# Patient Record
Sex: Female | Born: 1965
Health system: Southern US, Community
[De-identification: ages and names within clinical notes are randomized; demographics above are authoritative.]

## PROBLEM LIST (undated history)

## (undated) DIAGNOSIS — R519 Headache, unspecified: Secondary | ICD-10-CM

## (undated) DIAGNOSIS — Z87442 Personal history of urinary calculi: Secondary | ICD-10-CM

## (undated) DIAGNOSIS — Z9089 Acquired absence of other organs: Secondary | ICD-10-CM

## (undated) DIAGNOSIS — M1990 Unspecified osteoarthritis, unspecified site: Secondary | ICD-10-CM

## (undated) DIAGNOSIS — K5790 Diverticulosis of intestine, part unspecified, without perforation or abscess without bleeding: Secondary | ICD-10-CM

## (undated) DIAGNOSIS — R569 Unspecified convulsions: Secondary | ICD-10-CM

## (undated) DIAGNOSIS — T7840XA Allergy, unspecified, initial encounter: Secondary | ICD-10-CM

## (undated) DIAGNOSIS — T8859XA Other complications of anesthesia, initial encounter: Secondary | ICD-10-CM

## (undated) DIAGNOSIS — Z8719 Personal history of other diseases of the digestive system: Secondary | ICD-10-CM

## (undated) DIAGNOSIS — R112 Nausea with vomiting, unspecified: Secondary | ICD-10-CM

## (undated) DIAGNOSIS — I8390 Asymptomatic varicose veins of unspecified lower extremity: Secondary | ICD-10-CM

## (undated) DIAGNOSIS — D649 Anemia, unspecified: Secondary | ICD-10-CM

## (undated) DIAGNOSIS — K219 Gastro-esophageal reflux disease without esophagitis: Secondary | ICD-10-CM

## (undated) DIAGNOSIS — E041 Nontoxic single thyroid nodule: Secondary | ICD-10-CM

## (undated) DIAGNOSIS — E213 Hyperparathyroidism, unspecified: Secondary | ICD-10-CM

## (undated) DIAGNOSIS — J45909 Unspecified asthma, uncomplicated: Secondary | ICD-10-CM

## (undated) DIAGNOSIS — Z9889 Other specified postprocedural states: Secondary | ICD-10-CM

## (undated) DIAGNOSIS — Z5189 Encounter for other specified aftercare: Secondary | ICD-10-CM

## (undated) DIAGNOSIS — E119 Type 2 diabetes mellitus without complications: Secondary | ICD-10-CM

## (undated) DIAGNOSIS — G4730 Sleep apnea, unspecified: Secondary | ICD-10-CM

## (undated) DIAGNOSIS — I890 Lymphedema, not elsewhere classified: Secondary | ICD-10-CM

## (undated) HISTORY — PX: TONSILLECTOMY: SUR1361

## (undated) HISTORY — PX: FRACTURE SURGERY: SHX138

## (undated) HISTORY — PX: TUBAL LIGATION: SHX77

## (undated) HISTORY — DX: Nontoxic single thyroid nodule: E04.1

## (undated) HISTORY — PX: CHOLECYSTECTOMY: SHX55

## (undated) HISTORY — DX: Type 2 diabetes mellitus without complications: E11.9

## (undated) HISTORY — DX: Sleep apnea, unspecified: G47.30

## (undated) HISTORY — DX: Encounter for other specified aftercare: Z51.89

## (undated) HISTORY — DX: Anemia, unspecified: D64.9

## (undated) HISTORY — DX: Allergy, unspecified, initial encounter: T78.40XA

## (undated) HISTORY — PX: HERNIA REPAIR: SHX51

## (undated) HISTORY — DX: Unspecified asthma, uncomplicated: J45.909

## (undated) HISTORY — DX: Unspecified osteoarthritis, unspecified site: M19.90

## (undated) HISTORY — PX: ACETABULUM FRACTURE SURGERY: SHX541

## (undated) HISTORY — DX: Gastro-esophageal reflux disease without esophagitis: K21.9

---

## 1993-03-28 HISTORY — PX: OTHER SURGICAL HISTORY: SHX169

## 1993-03-28 HISTORY — PX: ELBOW FRACTURE SURGERY: SHX616

## 2002-09-05 ENCOUNTER — Other Ambulatory Visit: Admission: RE | Admit: 2002-09-05 | Discharge: 2002-09-05 | Payer: Self-pay | Admitting: Obstetrics and Gynecology

## 2003-10-28 ENCOUNTER — Other Ambulatory Visit: Admission: RE | Admit: 2003-10-28 | Discharge: 2003-10-28 | Payer: Self-pay | Admitting: Obstetrics and Gynecology

## 2005-01-28 ENCOUNTER — Ambulatory Visit: Payer: Self-pay | Admitting: General Surgery

## 2005-02-04 ENCOUNTER — Other Ambulatory Visit: Payer: Self-pay

## 2005-02-11 ENCOUNTER — Ambulatory Visit: Payer: Self-pay | Admitting: General Surgery

## 2005-04-26 ENCOUNTER — Ambulatory Visit: Payer: Self-pay | Admitting: Unknown Physician Specialty

## 2005-07-05 ENCOUNTER — Emergency Department: Payer: Self-pay | Admitting: Emergency Medicine

## 2005-10-26 ENCOUNTER — Emergency Department: Payer: Self-pay | Admitting: Emergency Medicine

## 2005-10-26 ENCOUNTER — Other Ambulatory Visit: Payer: Self-pay

## 2006-04-07 ENCOUNTER — Ambulatory Visit: Payer: Self-pay

## 2006-07-13 ENCOUNTER — Ambulatory Visit: Payer: Self-pay | Admitting: Internal Medicine

## 2006-07-19 ENCOUNTER — Other Ambulatory Visit: Payer: Self-pay

## 2006-07-19 ENCOUNTER — Emergency Department: Payer: Self-pay | Admitting: Internal Medicine

## 2006-12-13 ENCOUNTER — Ambulatory Visit: Payer: Self-pay | Admitting: Internal Medicine

## 2006-12-15 ENCOUNTER — Inpatient Hospital Stay: Payer: Self-pay | Admitting: Internal Medicine

## 2006-12-28 ENCOUNTER — Ambulatory Visit: Payer: Self-pay | Admitting: Internal Medicine

## 2007-04-05 ENCOUNTER — Other Ambulatory Visit: Payer: Self-pay

## 2007-04-05 ENCOUNTER — Inpatient Hospital Stay: Payer: Self-pay | Admitting: Internal Medicine

## 2007-04-19 ENCOUNTER — Ambulatory Visit: Payer: Self-pay

## 2007-05-06 ENCOUNTER — Ambulatory Visit: Payer: Self-pay | Admitting: Internal Medicine

## 2007-09-28 ENCOUNTER — Ambulatory Visit: Payer: Self-pay | Admitting: Internal Medicine

## 2007-11-27 ENCOUNTER — Emergency Department: Payer: Self-pay | Admitting: Emergency Medicine

## 2008-01-14 ENCOUNTER — Ambulatory Visit: Payer: Self-pay | Admitting: Emergency Medicine

## 2008-03-05 ENCOUNTER — Emergency Department: Payer: Self-pay | Admitting: Emergency Medicine

## 2008-03-23 ENCOUNTER — Emergency Department: Payer: Self-pay | Admitting: Emergency Medicine

## 2008-04-02 ENCOUNTER — Ambulatory Visit: Payer: Self-pay | Admitting: Internal Medicine

## 2008-05-20 ENCOUNTER — Ambulatory Visit: Payer: Self-pay

## 2008-06-05 ENCOUNTER — Emergency Department: Payer: Self-pay | Admitting: Emergency Medicine

## 2008-08-22 ENCOUNTER — Other Ambulatory Visit: Payer: Self-pay | Admitting: Internal Medicine

## 2009-04-08 ENCOUNTER — Other Ambulatory Visit: Payer: Self-pay | Admitting: Internal Medicine

## 2009-06-01 ENCOUNTER — Ambulatory Visit: Payer: Self-pay | Admitting: Gastroenterology

## 2009-06-03 ENCOUNTER — Ambulatory Visit: Payer: Self-pay

## 2009-10-07 ENCOUNTER — Other Ambulatory Visit: Payer: Self-pay | Admitting: Internal Medicine

## 2009-12-04 ENCOUNTER — Emergency Department: Payer: Self-pay | Admitting: Emergency Medicine

## 2010-03-25 ENCOUNTER — Other Ambulatory Visit: Payer: Self-pay | Admitting: Unknown Physician Specialty

## 2010-07-15 ENCOUNTER — Ambulatory Visit: Payer: Self-pay

## 2010-09-27 ENCOUNTER — Other Ambulatory Visit: Payer: Self-pay | Admitting: Unknown Physician Specialty

## 2010-10-11 ENCOUNTER — Other Ambulatory Visit: Payer: Self-pay | Admitting: Internal Medicine

## 2011-01-17 ENCOUNTER — Other Ambulatory Visit: Payer: Self-pay | Admitting: Internal Medicine

## 2011-04-07 ENCOUNTER — Other Ambulatory Visit: Payer: Self-pay | Admitting: Unknown Physician Specialty

## 2011-04-07 LAB — BASIC METABOLIC PANEL
Anion Gap: 8 (ref 7–16)
BUN: 15 mg/dL (ref 7–18)
Calcium, Total: 10.1 mg/dL (ref 8.5–10.1)
Chloride: 103 mmol/L (ref 98–107)
Co2: 29 mmol/L (ref 21–32)
Creatinine: 0.69 mg/dL (ref 0.60–1.30)
Glucose: 97 mg/dL (ref 65–99)
Osmolality: 280 (ref 275–301)
Potassium: 4.1 mmol/L (ref 3.5–5.1)
Sodium: 140 mmol/L (ref 136–145)

## 2011-04-07 LAB — HEMOGLOBIN A1C: Hemoglobin A1C: 6.9 % — ABNORMAL HIGH (ref 4.2–6.3)

## 2011-04-13 ENCOUNTER — Other Ambulatory Visit: Payer: Self-pay | Admitting: Internal Medicine

## 2011-04-13 LAB — CBC WITH DIFFERENTIAL/PLATELET
Basophil %: 3.4 %
Eosinophil #: 0.2 10*3/uL (ref 0.0–0.7)
HGB: 12.1 g/dL (ref 12.0–16.0)
Lymphocyte #: 1.9 10*3/uL (ref 1.0–3.6)
MCH: 26.9 pg (ref 26.0–34.0)
MCHC: 32.8 g/dL (ref 32.0–36.0)
Monocyte #: 0.4 10*3/uL (ref 0.0–0.7)
Neutrophil %: 57.7 %
Platelet: 372 10*3/uL (ref 150–440)

## 2011-04-13 LAB — LIPID PANEL
Cholesterol: 135 mg/dL (ref 0–200)
HDL Cholesterol: 69 mg/dL — ABNORMAL HIGH (ref 40–60)
Triglycerides: 83 mg/dL (ref 0–200)
VLDL Cholesterol, Calc: 17 mg/dL (ref 5–40)

## 2011-04-13 LAB — HEPATIC FUNCTION PANEL A (ARMC)
Albumin: 3.8 g/dL (ref 3.4–5.0)
Alkaline Phosphatase: 55 U/L (ref 50–136)
Bilirubin, Direct: <0.1 mg/dL (ref 0.00–0.20)

## 2011-07-21 ENCOUNTER — Ambulatory Visit: Payer: Self-pay

## 2011-12-30 ENCOUNTER — Inpatient Hospital Stay: Payer: Self-pay | Admitting: Internal Medicine

## 2011-12-30 LAB — URINALYSIS, COMPLETE
Bilirubin,UR: NEGATIVE
Glucose,UR: 50 mg/dL (ref 0–75)
Ketone: NEGATIVE
Nitrite: NEGATIVE
Ph: 5 (ref 4.5–8.0)
RBC,UR: 11 /HPF (ref 0–5)
Squamous Epithelial: 3
WBC UR: 1 /HPF (ref 0–5)

## 2011-12-30 LAB — CBC
MCH: 26.4 pg (ref 26.0–34.0)
MCHC: 32.3 g/dL (ref 32.0–36.0)
MCV: 82 fL (ref 80–100)
Platelet: 361 10*3/uL (ref 150–440)
RBC: 4.26 10*6/uL (ref 3.80–5.20)
RDW: 15.3 % — ABNORMAL HIGH (ref 11.5–14.5)
WBC: 17.5 10*3/uL — ABNORMAL HIGH (ref 3.6–11.0)

## 2011-12-30 LAB — COMPREHENSIVE METABOLIC PANEL
Anion Gap: 11 (ref 7–16)
BUN: 13 mg/dL (ref 7–18)
Bilirubin,Total: 0.6 mg/dL (ref 0.2–1.0)
Calcium, Total: 9.2 mg/dL (ref 8.5–10.1)
Chloride: 104 mmol/L (ref 98–107)
Creatinine: 0.77 mg/dL (ref 0.60–1.30)
EGFR (African American): >60
Potassium: 3.1 mmol/L — ABNORMAL LOW (ref 3.5–5.1)
SGPT (ALT): 29 U/L (ref 12–78)
Total Protein: 7.3 g/dL (ref 6.4–8.2)

## 2011-12-30 LAB — CK TOTAL AND CKMB (NOT AT ARMC): CK-MB: 0.5 ng/mL (ref 0.5–3.6)

## 2011-12-30 LAB — TROPONIN I: Troponin-I: <0.02 ng/mL

## 2011-12-30 LAB — PRO B NATRIURETIC PEPTIDE: B-Type Natriuretic Peptide: <5 pg/mL (ref 0–125)

## 2011-12-31 LAB — BASIC METABOLIC PANEL
Anion Gap: 11 (ref 7–16)
BUN: 8 mg/dL (ref 7–18)
Creatinine: 0.67 mg/dL (ref 0.60–1.30)
EGFR (African American): >60
EGFR (Non-African Amer.): >60
Glucose: 184 mg/dL — ABNORMAL HIGH (ref 65–99)
Sodium: 143 mmol/L (ref 136–145)

## 2011-12-31 LAB — CBC WITH DIFFERENTIAL/PLATELET
Basophil #: 0 10*3/uL (ref 0.0–0.1)
Eosinophil #: 0 10*3/uL (ref 0.0–0.7)
Eosinophil %: 0 %
HCT: 34.5 % — ABNORMAL LOW (ref 35.0–47.0)
Lymphocyte %: 8.7 %
MCH: 26.6 pg (ref 26.0–34.0)
MCHC: 32.8 g/dL (ref 32.0–36.0)
Monocyte %: 2 %
Neutrophil #: 10.8 10*3/uL — ABNORMAL HIGH (ref 1.4–6.5)
Neutrophil %: 89 %
RBC: 4.25 10*6/uL (ref 3.80–5.20)
RDW: 15.7 % — ABNORMAL HIGH (ref 11.5–14.5)
WBC: 12.2 10*3/uL — ABNORMAL HIGH (ref 3.6–11.0)

## 2012-01-23 ENCOUNTER — Other Ambulatory Visit: Payer: Self-pay | Admitting: Internal Medicine

## 2012-01-23 LAB — CBC WITH DIFFERENTIAL/PLATELET
Basophil %: 2 %
Eosinophil #: 0.3 10*3/uL (ref 0.0–0.7)
Eosinophil %: 5.3 %
HCT: 33 % — ABNORMAL LOW (ref 35.0–47.0)
HGB: 11.2 g/dL — ABNORMAL LOW (ref 12.0–16.0)
Lymphocyte #: 2 10*3/uL (ref 1.0–3.6)
MCH: 27.8 pg (ref 26.0–34.0)
MCHC: 33.9 g/dL (ref 32.0–36.0)
MCV: 82 fL (ref 80–100)
Monocyte #: 0.4 x10 3/mm (ref 0.2–0.9)
Monocyte %: 7 %
Neutrophil #: 3.5 10*3/uL (ref 1.4–6.5)
Neutrophil %: 55.2 %
Platelet: 365 10*3/uL (ref 150–440)
RBC: 4.02 10*6/uL (ref 3.80–5.20)
WBC: 6.4 10*3/uL (ref 3.6–11.0)

## 2012-01-23 LAB — LIPID PANEL
Cholesterol: 156 mg/dL (ref 0–200)
HDL Cholesterol: 72 mg/dL — ABNORMAL HIGH (ref 40–60)
Triglycerides: 127 mg/dL (ref 0–200)
VLDL Cholesterol, Calc: 25 mg/dL (ref 5–40)

## 2012-01-23 LAB — URINALYSIS, COMPLETE
Blood: NEGATIVE
Leukocyte Esterase: NEGATIVE
Nitrite: NEGATIVE
Protein: NEGATIVE
Specific Gravity: 1.012 (ref 1.003–1.030)

## 2012-01-23 LAB — COMPREHENSIVE METABOLIC PANEL
Albumin: 3.6 g/dL (ref 3.4–5.0)
Anion Gap: 9 (ref 7–16)
Bilirubin,Total: 0.7 mg/dL (ref 0.2–1.0)
Calcium, Total: 10.1 mg/dL (ref 8.5–10.1)
Creatinine: 0.87 mg/dL (ref 0.60–1.30)
Glucose: 116 mg/dL — ABNORMAL HIGH (ref 65–99)
Potassium: 3.8 mmol/L (ref 3.5–5.1)
SGOT(AST): 27 U/L (ref 15–37)

## 2012-01-23 LAB — BILIRUBIN, DIRECT: Bilirubin, Direct: 0.1 mg/dL (ref 0.00–0.20)

## 2012-01-23 LAB — HEMOGLOBIN A1C: Hemoglobin A1C: 6.9 % — ABNORMAL HIGH (ref 4.2–6.3)

## 2012-03-28 HISTORY — PX: LAPAROSCOPIC GASTRIC SLEEVE RESECTION: SHX5895

## 2012-04-25 ENCOUNTER — Encounter: Payer: Self-pay | Admitting: Unknown Physician Specialty

## 2012-04-28 ENCOUNTER — Encounter: Payer: Self-pay | Admitting: Unknown Physician Specialty

## 2012-05-17 ENCOUNTER — Emergency Department: Payer: Self-pay | Admitting: Emergency Medicine

## 2012-05-17 LAB — CBC
HCT: 36.2 % (ref 35.0–47.0)
MCH: 23.8 pg — ABNORMAL LOW (ref 26.0–34.0)
MCHC: 30.9 g/dL — ABNORMAL LOW (ref 32.0–36.0)
MCV: 77 fL — ABNORMAL LOW (ref 80–100)
Platelet: 343 10*3/uL (ref 150–440)
RBC: 4.71 10*6/uL (ref 3.80–5.20)
RDW: 16 % — ABNORMAL HIGH (ref 11.5–14.5)
WBC: 12 10*3/uL — ABNORMAL HIGH (ref 3.6–11.0)

## 2012-05-17 LAB — URINALYSIS, COMPLETE
Bilirubin,UR: NEGATIVE
Blood: NEGATIVE
Glucose,UR: NEGATIVE mg/dL (ref 0–75)
Ketone: NEGATIVE
Nitrite: NEGATIVE
Protein: NEGATIVE
WBC UR: 1 /HPF (ref 0–5)

## 2012-05-17 LAB — COMPREHENSIVE METABOLIC PANEL
Albumin: 3.4 g/dL (ref 3.4–5.0)
Alkaline Phosphatase: 106 U/L (ref 50–136)
Anion Gap: 10 (ref 7–16)
BUN: 12 mg/dL (ref 7–18)
Calcium, Total: 9.6 mg/dL (ref 8.5–10.1)
Chloride: 105 mmol/L (ref 98–107)
EGFR (African American): >60
EGFR (Non-African Amer.): >60
Osmolality: 278 (ref 275–301)
SGOT(AST): 42 U/L — ABNORMAL HIGH (ref 15–37)

## 2012-05-17 LAB — LIPASE, BLOOD: Lipase: 74 U/L (ref 73–393)

## 2012-05-26 ENCOUNTER — Encounter: Payer: Self-pay | Admitting: Unknown Physician Specialty

## 2012-07-13 ENCOUNTER — Other Ambulatory Visit: Payer: Self-pay | Admitting: Internal Medicine

## 2012-07-13 LAB — CBC WITH DIFFERENTIAL/PLATELET
Basophil #: 0.1 10*3/uL (ref 0.0–0.1)
Basophil %: 1.2 %
Eosinophil #: 0.2 10*3/uL (ref 0.0–0.7)
Eosinophil %: 3.3 %
HCT: 34.7 % — ABNORMAL LOW (ref 35.0–47.0)
Lymphocyte #: 2.2 10*3/uL (ref 1.0–3.6)
Lymphocyte %: 36.1 %
MCH: 24.7 pg — ABNORMAL LOW (ref 26.0–34.0)
MCHC: 32.2 g/dL (ref 32.0–36.0)
MCV: 77 fL — ABNORMAL LOW (ref 80–100)
Monocyte #: 0.4 x10 3/mm (ref 0.2–0.9)
Monocyte %: 5.9 %
Neutrophil #: 3.3 10*3/uL (ref 1.4–6.5)
Neutrophil %: 53.5 %
RDW: 15.7 % — ABNORMAL HIGH (ref 11.5–14.5)
WBC: 6.1 10*3/uL (ref 3.6–11.0)

## 2012-07-13 LAB — URINALYSIS, COMPLETE
Bilirubin,UR: NEGATIVE
Blood: NEGATIVE
Nitrite: NEGATIVE
Squamous Epithelial: 1
WBC UR: 3 /HPF (ref 0–5)

## 2012-07-13 LAB — BASIC METABOLIC PANEL
Anion Gap: 6 — ABNORMAL LOW (ref 7–16)
BUN: 12 mg/dL (ref 7–18)
Calcium, Total: 9.8 mg/dL (ref 8.5–10.1)
Chloride: 107 mmol/L (ref 98–107)
Co2: 27 mmol/L (ref 21–32)
Creatinine: 0.74 mg/dL (ref 0.60–1.30)
EGFR (Non-African Amer.): >60
Glucose: 98 mg/dL (ref 65–99)
Osmolality: 279 (ref 275–301)
Potassium: 3.8 mmol/L (ref 3.5–5.1)
Sodium: 140 mmol/L (ref 136–145)

## 2012-07-13 LAB — HEMOGLOBIN A1C: Hemoglobin A1C: 7.1 % — ABNORMAL HIGH (ref 4.2–6.3)

## 2012-08-08 ENCOUNTER — Ambulatory Visit: Payer: Self-pay | Admitting: Specialist

## 2012-08-09 ENCOUNTER — Ambulatory Visit: Payer: Self-pay | Admitting: Bariatrics

## 2012-08-09 LAB — CBC WITH DIFFERENTIAL/PLATELET
Basophil %: 1.2 %
Eosinophil #: 0 10*3/uL (ref 0.0–0.7)
Eosinophil %: 0 %
MCH: 24.2 pg — ABNORMAL LOW (ref 26.0–34.0)
Monocyte #: 0.5 x10 3/mm (ref 0.2–0.9)
Neutrophil #: 10.6 10*3/uL — ABNORMAL HIGH (ref 1.4–6.5)
RDW: 16.7 % — ABNORMAL HIGH (ref 11.5–14.5)
WBC: 13.4 10*3/uL — ABNORMAL HIGH (ref 3.6–11.0)

## 2012-08-09 LAB — COMPREHENSIVE METABOLIC PANEL
Alkaline Phosphatase: 107 U/L (ref 50–136)
Anion Gap: 6 — ABNORMAL LOW (ref 7–16)
BUN: 11 mg/dL (ref 7–18)
Chloride: 103 mmol/L (ref 98–107)
Co2: 28 mmol/L (ref 21–32)
EGFR (African American): >60
EGFR (Non-African Amer.): >60
Glucose: 114 mg/dL — ABNORMAL HIGH (ref 65–99)
Potassium: 3.8 mmol/L (ref 3.5–5.1)
Sodium: 137 mmol/L (ref 136–145)

## 2012-08-09 LAB — IRON AND TIBC
Iron Bind.Cap.(Total): 414 ug/dL (ref 250–450)
Iron Saturation: 12 %
Iron: 49 ug/dL — ABNORMAL LOW (ref 50–170)

## 2012-08-09 LAB — HEMOGLOBIN A1C: Hemoglobin A1C: 7.4 % — ABNORMAL HIGH (ref 4.2–6.3)

## 2012-08-09 LAB — MAGNESIUM: Magnesium: 1.8 mg/dL

## 2012-08-09 LAB — APTT: Activated PTT: 30.3 secs (ref 23.6–35.9)

## 2012-08-09 LAB — FERRITIN: Ferritin (ARMC): 26 ng/mL (ref 8–388)

## 2012-08-09 LAB — FOLATE: Folic Acid: 18.2 ng/mL (ref 3.1–100.0)

## 2012-08-09 LAB — PHOSPHORUS: Phosphorus: 2.8 mg/dL (ref 2.5–4.9)

## 2012-08-09 LAB — AMYLASE: Amylase: 46 U/L (ref 25–115)

## 2012-08-09 LAB — LIPASE, BLOOD: Lipase: 45 U/L — ABNORMAL LOW (ref 73–393)

## 2012-08-14 ENCOUNTER — Ambulatory Visit: Payer: Self-pay | Admitting: Obstetrics and Gynecology

## 2012-09-05 ENCOUNTER — Ambulatory Visit: Payer: Self-pay | Admitting: Gastroenterology

## 2012-09-06 LAB — PATHOLOGY REPORT

## 2012-09-11 ENCOUNTER — Ambulatory Visit: Payer: Self-pay | Admitting: Specialist

## 2012-09-25 ENCOUNTER — Ambulatory Visit: Payer: Self-pay | Admitting: Specialist

## 2012-10-25 ENCOUNTER — Ambulatory Visit: Payer: Self-pay | Admitting: Specialist

## 2012-10-26 ENCOUNTER — Ambulatory Visit: Payer: Self-pay | Admitting: Specialist

## 2012-11-26 ENCOUNTER — Ambulatory Visit: Payer: Self-pay | Admitting: Specialist

## 2013-01-29 ENCOUNTER — Ambulatory Visit: Payer: Self-pay | Admitting: Specialist

## 2013-01-29 LAB — HEMOGLOBIN: HGB: 12.4 g/dL (ref 12.0–16.0)

## 2013-02-05 ENCOUNTER — Inpatient Hospital Stay: Payer: Self-pay | Admitting: Specialist

## 2013-02-06 LAB — BASIC METABOLIC PANEL
Anion Gap: 10 (ref 7–16)
Calcium, Total: 9.1 mg/dL (ref 8.5–10.1)
Co2: 24 mmol/L (ref 21–32)
Creatinine: 0.82 mg/dL (ref 0.60–1.30)
EGFR (African American): >60
EGFR (Non-African Amer.): >60
Glucose: 119 mg/dL — ABNORMAL HIGH (ref 65–99)
Potassium: 3.4 mmol/L — ABNORMAL LOW (ref 3.5–5.1)
Sodium: 138 mmol/L (ref 136–145)

## 2013-02-06 LAB — CBC WITH DIFFERENTIAL/PLATELET
Basophil #: 0.1 10*3/uL (ref 0.0–0.1)
Eosinophil %: 0.3 %
HGB: 11.5 g/dL — ABNORMAL LOW (ref 12.0–16.0)
MCH: 27.1 pg (ref 26.0–34.0)
MCV: 80 fL (ref 80–100)
Monocyte #: 0.6 x10 3/mm (ref 0.2–0.9)
Monocyte %: 6.1 %
Neutrophil #: 7 10*3/uL — ABNORMAL HIGH (ref 1.4–6.5)
Platelet: 309 10*3/uL (ref 150–440)
RBC: 4.23 10*6/uL (ref 3.80–5.20)
WBC: 9.5 10*3/uL (ref 3.6–11.0)

## 2013-02-06 LAB — PHOSPHORUS: Phosphorus: 2.4 mg/dL — ABNORMAL LOW (ref 2.5–4.9)

## 2013-02-06 LAB — ALBUMIN: Albumin: 3.3 g/dL — ABNORMAL LOW (ref 3.4–5.0)

## 2013-02-06 LAB — PATHOLOGY REPORT

## 2013-02-06 LAB — MAGNESIUM: Magnesium: 1.3 mg/dL — ABNORMAL LOW

## 2013-02-25 ENCOUNTER — Ambulatory Visit: Payer: Self-pay | Admitting: Specialist

## 2013-03-28 ENCOUNTER — Ambulatory Visit: Payer: Self-pay | Admitting: Specialist

## 2013-05-07 ENCOUNTER — Other Ambulatory Visit: Payer: Self-pay | Admitting: Internal Medicine

## 2013-05-07 LAB — CBC WITH DIFFERENTIAL/PLATELET
Basophil #: 0.1 10*3/uL (ref 0.0–0.1)
Basophil %: 1.4 %
Eosinophil #: 0.3 10*3/uL (ref 0.0–0.7)
Eosinophil %: 4.9 %
HCT: 37.4 % (ref 35.0–47.0)
HGB: 12.6 g/dL (ref 12.0–16.0)
LYMPHS ABS: 2.5 10*3/uL (ref 1.0–3.6)
LYMPHS PCT: 37.6 %
MCH: 28.2 pg (ref 26.0–34.0)
MCHC: 33.6 g/dL (ref 32.0–36.0)
MCV: 84 fL (ref 80–100)
Monocyte #: 0.5 x10 3/mm (ref 0.2–0.9)
Monocyte %: 7.7 %
Neutrophil #: 3.2 10*3/uL (ref 1.4–6.5)
Neutrophil %: 48.4 %
Platelet: 278 10*3/uL (ref 150–440)
RBC: 4.47 10*6/uL (ref 3.80–5.20)
RDW: 16.1 % — ABNORMAL HIGH (ref 11.5–14.5)
WBC: 6.6 10*3/uL (ref 3.6–11.0)

## 2013-05-07 LAB — URINALYSIS, COMPLETE
BILIRUBIN, UR: NEGATIVE
Blood: NEGATIVE
Glucose,UR: NEGATIVE mg/dL (ref 0–75)
Nitrite: NEGATIVE
PH: 5 (ref 4.5–8.0)
Specific Gravity: 1.029 (ref 1.003–1.030)
Squamous Epithelial: 7

## 2013-05-07 LAB — COMPREHENSIVE METABOLIC PANEL
ALBUMIN: 3.5 g/dL (ref 3.4–5.0)
Alkaline Phosphatase: 90 U/L
Anion Gap: 3 — ABNORMAL LOW (ref 7–16)
BUN: 10 mg/dL (ref 7–18)
Bilirubin,Total: 1 mg/dL (ref 0.2–1.0)
CO2: 30 mmol/L (ref 21–32)
Calcium, Total: 10.3 mg/dL — ABNORMAL HIGH (ref 8.5–10.1)
Chloride: 105 mmol/L (ref 98–107)
Creatinine: 0.81 mg/dL (ref 0.60–1.30)
EGFR (African American): >60
EGFR (Non-African Amer.): >60
Glucose: 99 mg/dL (ref 65–99)
OSMOLALITY: 275 (ref 275–301)
Potassium: 3.4 mmol/L — ABNORMAL LOW (ref 3.5–5.1)
SGOT(AST): 32 U/L (ref 15–37)
SGPT (ALT): 44 U/L (ref 12–78)
SODIUM: 138 mmol/L (ref 136–145)
TOTAL PROTEIN: 7.8 g/dL (ref 6.4–8.2)

## 2013-05-07 LAB — LIPID PANEL
Cholesterol: 130 mg/dL (ref 0–200)
HDL Cholesterol: 66 mg/dL — ABNORMAL HIGH (ref 40–60)
Ldl Cholesterol, Calc: 46 mg/dL (ref 0–100)
TRIGLYCERIDES: 92 mg/dL (ref 0–200)
VLDL Cholesterol, Calc: 18 mg/dL (ref 5–40)

## 2013-09-02 ENCOUNTER — Ambulatory Visit: Payer: Self-pay | Admitting: Obstetrics and Gynecology

## 2013-10-22 ENCOUNTER — Other Ambulatory Visit: Payer: Self-pay | Admitting: Specialist

## 2013-10-22 LAB — CBC WITH DIFFERENTIAL/PLATELET
BASOS PCT: 1.2 %
Basophil #: 0.1 10*3/uL (ref 0.0–0.1)
Eosinophil #: 0.3 10*3/uL (ref 0.0–0.7)
Eosinophil %: 4 %
HCT: 35.8 % (ref 35.0–47.0)
HGB: 12 g/dL (ref 12.0–16.0)
LYMPHS ABS: 2.2 10*3/uL (ref 1.0–3.6)
Lymphocyte %: 34.2 %
MCH: 29 pg (ref 26.0–34.0)
MCHC: 33.6 g/dL (ref 32.0–36.0)
MCV: 87 fL (ref 80–100)
MONOS PCT: 5.6 %
Monocyte #: 0.4 x10 3/mm (ref 0.2–0.9)
Neutrophil #: 3.6 10*3/uL (ref 1.4–6.5)
Neutrophil %: 55 %
PLATELETS: 332 10*3/uL (ref 150–440)
RBC: 4.14 10*6/uL (ref 3.80–5.20)
RDW: 14.2 % (ref 11.5–14.5)
WBC: 6.5 10*3/uL (ref 3.6–11.0)

## 2013-10-22 LAB — COMPREHENSIVE METABOLIC PANEL
ANION GAP: 4 — AB (ref 7–16)
Albumin: 3.8 g/dL (ref 3.4–5.0)
Alkaline Phosphatase: 101 U/L
BUN: 10 mg/dL (ref 7–18)
Bilirubin,Total: 0.8 mg/dL (ref 0.2–1.0)
CALCIUM: 10.3 mg/dL — AB (ref 8.5–10.1)
Chloride: 103 mmol/L (ref 98–107)
Co2: 30 mmol/L (ref 21–32)
Creatinine: 0.78 mg/dL (ref 0.60–1.30)
EGFR (African American): >60
EGFR (Non-African Amer.): >60
Glucose: 87 mg/dL (ref 65–99)
Osmolality: 272 (ref 275–301)
Potassium: 3.9 mmol/L (ref 3.5–5.1)
SGOT(AST): 35 U/L (ref 15–37)
SGPT (ALT): 38 U/L
Sodium: 137 mmol/L (ref 136–145)
Total Protein: 8.4 g/dL — ABNORMAL HIGH (ref 6.4–8.2)

## 2013-10-22 LAB — IRON AND TIBC
IRON BIND. CAP.(TOTAL): 312 ug/dL (ref 250–450)
Iron Saturation: 13 %
Iron: 40 ug/dL — ABNORMAL LOW (ref 50–170)
UNBOUND IRON-BIND. CAP.: 272 ug/dL

## 2013-10-22 LAB — AMYLASE: AMYLASE: 73 U/L (ref 25–115)

## 2013-10-22 LAB — FERRITIN: Ferritin (ARMC): 137 ng/mL (ref 8–388)

## 2013-10-22 LAB — FOLATE: Folic Acid: 16.9 ng/mL (ref 3.1–100.0)

## 2013-10-22 LAB — MAGNESIUM: Magnesium: 2.1 mg/dL

## 2013-10-22 LAB — PHOSPHORUS: Phosphorus: 3 mg/dL (ref 2.5–4.9)

## 2014-07-15 NOTE — Consult Note (Signed)
PATIENT NAME:  Ashley [name], Ashley [name] MR#:  [id number] DATE OF BIRTH:  [birth year]  DATE OF CONSULTATION:  12/30/2011  REFERRING PHYSICIAN:   Glennie IsleSheryl Gottlieb, MD  CONSULTING PHYSICIAN:  Zackery BarefootJ. 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 She noticed some initial shortness of breath and rapid heart rate as well as cough which progressed very quickly where she became short of breath and tachypneic. I was called emergently by the Emergency Room and Dr. Mindi JunkerGottlieb for suspicion of progressive angioedema presumptively secondary to the flu shot. When I arrived in the Emergency Room, the patient was coughing and obviously very anxious, but I detected no stridor. After I examined her and my examination revealed primarily bronchospasm, worse on the right than on the left, I advised the patient, who was still coherent at that point, that I would electively intubate her if she became exhausted and could not sustain her tachypnea; and she admitted to be coming more fatigued after about 5 to 10 minutes, at which time we elected to intubate her.   ALLERGIES: Penicillin and Dilaudid.   PAST MEDICAL HISTORY:  1. Diabetes.  2. Iron deficiency anemia.  3. Childhood migraines.  4. Obstructive sleep apnea.  5. Normocalcemic hyperparathyroidism.  6. Barrett's esophagus.   PHYSICAL EXAMINATION:  GENERAL: A very anxious female with tachypnea and cough that is very frequent in spite of her tachypnea. No stridor.   LUNGS: There is expiratory wheeze bilaterally, worse on the right than on the left.   HEENT: Oral cavity/oropharynx: Mild floor of mouth edema.   PROCEDURE: Flexible fiber-optic laryngoscopy reveals normal nasopharynx. The endolarynx  is significant for interarytenoid pachyderma with erythema. No significant supraglottic edema. No epiglottic inflammation. No purulence. Vocal cords abduct and adduct fully. No subglottic lesions.   IMPRESSION: Bronchospastic reaction ostensibly to the flu shot as noted above. She became acutely short of breath and required intubation because of respiratory fatigue.    PLAN:  1. Dr. Cherlynn KaiserSainani has been kind enough to admit the patient, and she will receive IV Solu-Medrol as well as p.r.n. Xopenex.  2. She will receive Pulmonary consultation by Dr. Ned ClinesHerbon Fleming.  3. If the patient is able to be weaned and extubated tomorrow, I will plan on being present for extubation to make sure she is without any upper airway compromise.   ____________________________ J. Gertie BaronMadison Agueda Houpt, MD jmc:cbb [name]: 12/30/2011 18:10:33 ET T: 12/30/2011 18:38:13 ET JOB#: [id number]  cc: Zackery BarefootJ. Madison Jamaria Amborn, MD, <Dictator> Glorious PeachSonya Thompson -  ENT Wendee CoppJMADISON Dorcus Riga MD ELECTRONICALLY SIGNED 01/26/2012 10:39

## 2014-07-15 NOTE — H&P (Signed)
PATIENT NAME:  Ashley [name], Ashley [name] MR#:  [id number] DATE OF BIRTH:  [birth year]  DATE OF ADMISSION:  12/30/2011  PRIMARY CARE PHYSICIAN: Daniel Nones, Ashley [name]   CHIEF COMPLAINT: Shortness of breath.   HISTORY OF PRESENT ILLNESS: This is a 49 year old female who actually came to work, she works here in the Emergency Room, and was at work and went to get a flu shot earlier this morning. Shortly after she got the flu shot, she started to become a bit short of breath and tachypnea. She was complaining of some pain on the site where she got the flu shot, but her respiratory symptoms progressively started to get worse. The patient started to have a cough and was having significant difficulty breathing. It was suspected that she was having an allergic reaction to the flu shot, although she has had flu shots every year, but never had a reaction like this. As per the husband and other family, she has had no other environmental exposures and no herbal supplements, no other new medications. She was seen by Dr. Gertie Baron from ENT and underwent a laryngoscopy which showed no evidence of upper airway erythema or vocal cord edema. Her bronchospasm continued to get worse and she was having a difficult time breathing. Therefore she was urgently intubated in the Emergency Room. Hospitalist services were contacted for further treatment and evaluation. Prior to today, the patient had no systemic complaints of shortness of breath, chest pain, nausea, vomiting, abdominal pain, fevers, chills, cough, or any other associated symptoms.   REVIEW OF SYSTEMS: Currently unobtainable given the patient's mental status. She is intubated and sedated.   PAST MEDICAL HISTORY:  1. Diabetes.  2. Iron deficiency anemia. 3. History of childhood migraines.  4. History of obstructive sleep apnea. 5. Hyperparathyroidism with normal calcium.  6. History of Barrett's esophagus.   ALLERGIES: Penicillin and Dilaudid. Penicillin causes  anaphylaxis.   SOCIAL HISTORY: No smoking. No alcohol abuse. No illicit drug abuse. Lives at home with her husband.   FAMILY HISTORY: Significant for diabetes on both the mother and father's side of the family. Her mother is also on dialysis.   CURRENT MEDICATIONS: The patient is currently taking just multivitamins, but no medications, even for her diabetes, presently.   ADMISSION PHYSICAL EXAMINATION:  VITAL SIGNS: Temperature is afebrile, pulse 104, respirations 18, blood pressure 128/66, and  saturation 100% on 50% FiO2 on a ventilator.   GENERAL: A female lying in bed, intubated and sedated, in mild distress.   HEENT: Atraumatic, normocephalic. Her pupils are equal and reactive to light. Sclerae anicteric. No conjunctival injection. No pharyngeal erythema.   NECK: Supple. No jugular venous distention, bruits, lymphadenopathy, or thyromegaly.   HEART: Regular rate and rhythm, tachycardic. No murmurs, rubs, or clicks. Anteriorly has coarse rhonchi and wheezing diffusely, although negative use of accessory muscles. No dullness to percussion.   ABDOMEN: Soft, flat, nontender, and nondistended. Has good bowel sounds. No hepatosplenomegaly appreciated.   EXTREMITIES: No evidence of any cyanosis, clubbing, or peripheral edema. Has +2 pedal and radial pulses bilaterally.   NEUROLOGIC: Difficult to do a full neurological examination as she is currently sedated and intubated.   SKIN: Moist and warm with no rashes.   LYMPHATIC: No cervical or axillary lymphadenopathy.  LABS: Serum glucose 203, BUN 13, creatinine 0.7, sodium 137, potassium 3.1, chloride 104, and bicarbonate 22. LFTs are within normal limits. Troponin less than 0.02. White cell count 17.5, hemoglobin 11.2, hematocrit 34.8, and platelet count 361.  ABG  showed pH 7.36, pCO2 40, pO2 58, and saturation 93% on 30% FiO2.   EKG showed normal sinus rhythm and sinus tachycardia, but no evidence of acute ST or T wave changes.    ASSESSMENT AND PLAN: This is a 49 year old female with a history of diabetes, history of childhood migraines, obstructive sleep apnea, and hyperparathyroidism who presents to the hospital with shortness of breath and noted to have an allergic reaction to a flu shot going into respiratory failure and now requiring intubation.  1. Acute respiratory failure. This is likely suspected to be secondary to an allergic reaction to the flu shot as the patient went into severe bronchospasm and shortness of breath shortly after it. She has no previous history of chronic obstructive pulmonary disease or asthma. There are no other environmental agents or exposures noted. For now we will continue full vent support and wean her as tolerated. I will get a pulmonary consult to help with the vent management. We will put her on IV Solu-Medrol every six hours, also continue IV Zantac and p.r.n. Benadryl for now, and follow her clinically.  2. Allergic reaction/anaphylaxis. She does have a history of allergy to penicillin an Dilaudid, but she did not get any of these medications. The only thing she got this morning was a flu shot which is likely suspected to be the culprit for anaphylaxis or allergic reaction. She is currently intubated. I will continue IV Solu-Medrol, Zantac, and p.r.n. Benadryl. She has already had a laryngoscopy done by Dr. Gertie BaronMadison Clark, which did not show any upper airway erythema or vocal cord edema. She likely will benefit from outpatient allergy testing upon discharge.  3. Leukocytosis. I think this is likely stress/steroid mediated. No evidence of acute infection noted. I will go ahead and follow her white cell count. She is afebrile and hemodynamically stable.  4. Diabetes. The patient currently is not taking any medications, but she will be getting IV steroids so her sugars will run high. I will go ahead and start her on sliding scale insulin for now.  5. Hypokalemia. I will go ahead and supplement  her potassium accordingly. We will check a magnesium level.     CODE STATUS: FULL CODE.  The patient will be transferred over to Dr. Deland PrettyBert Klein's service.   CRITICAL CARE TIME SPENT: 50 minutes.  ____________________________ Rolly PancakeVivek J. Cherlynn KaiserSainani, Ashley [name] vjs:slb D: 12/30/2011 15:58:56 ET T: 12/30/2011 16:18:06 ET JOB#: [id number]  cc: Rolly PancakeVivek J. Cherlynn KaiserSainani, Ashley [name], <Dictator> Ashley FerrierBert J. Klein III, Ashley [name] Houston SirenVIVEK J Deiona Hooper Ashley [name] ELECTRONICALLY SIGNED 01/07/2012 14:57

## 2014-07-15 NOTE — Discharge Summary (Signed)
PATIENT NAME:  Ashley [name], Ashley [name], Ashley [name]  DATE OF ADMISSION:  12/30/2011 DATE OF DISCHARGE:  01/02/2012  FINAL DIAGNOSES:  1. Acute respiratory failure due to allergic reaction to flu shot.  2. Gastroesophageal reflux disease.  3. Adult onset diabetes mellitus.  4. Anemia.  5. Obstructive sleep apnea.  6. History of hyperparathyroidism.   HISTORY AND PHYSICAL: Please see dictated admission history and physical.   SUMMARY OF HOSPITAL COURSE: The patient was admitted with progressive dyspnea and bronchospasm after getting a shot that morning. Her condition deteriorated to the point that she was unable to maintain her oxygenation and she was intubated. Fortunately she was able to be weaned and extubated fairly rapidly, although she continued to have significant evidence of bronchospasm. She was followed by both pulmonology and ENT. She was placed on increasing doses of medications versus reflux and continued on steroid treatment. Antihistamines were added in addition. She was weaned to room air and was able to begin ambulating on her own, although she still felt weak and had some minimal wheezing. Her oxygen saturations were excellent and she was walking well without issue. At that point, it was felt she was stable to be discharged to home and so was sent home with activity to be up as tolerated with anticipation to return to work initially within one week, but really after we had a chance to reevaluate her. She is advised to check her sugars and record these daily and to present back to the emergency room immediately should she develop any increasing shortness of breath. She is comfortable with this plan.   DISCHARGE MEDICATIONS:  1. Ferrous sulfate 325 mg p.o. daily. 2. Victoza 1.2 mcg injected daily.  3. Prednisone taper as directed.  4. Zantac 150 mg p.o. twice a day x12 days.  5. Omeprazole 20 mg p.o. twice a day. 6. Claritin 10 mg p.o. daily x12  days.  7. Albuterol metered-dose inhaler 2 puffs every 4 hours as needed for wheezing.   DIET: She is advised to follow an 1800 calorie ADA diet, in addition.  ____________________________ Curtis SitesBert J. Rosealyn Little III, MD bjk:slb D: 01/10/2012 17:08:44 ET T: 01/11/2012 10:58:00 ET JOB#: [id number]  cc: Curtis SitesBert J. Aras Albarran III, MD, <Dictator> Daniel NonesBERT Kataleya Zaugg MD ELECTRONICALLY SIGNED 01/11/2012 12:40

## 2014-07-18 NOTE — H&P (Signed)
   Subjective/Chief Complaint morbid obesity   History of Present Illness here for sleeve gastrectomy   Past Med/Surgical Hx:  vitamin d & E deficiency:   anemia:   diverticulitis:   Diabetes Mellitus, Type II (NIDD):   Cholecystectomy:   Tubal Ligation:   left pelvic cap due to fx:   elbow surgery - left:   ankle surgery - left:   C-Section:   Hernia Repair:   ALLERGIES:  PCN: Rash  Other -Explain in Comment: Rash  flu vaccines: Anaphylaxis  Dilaudid: Itching  Review of Systems:  Fever/Chills No   Cough No   Sputum No   Abdominal Pain No   Diarrhea No   Constipation No   Nausea/Vomiting No   SOB/DOE No   Chest Pain No   Telemetry Reviewed Afib   Dysuria No   Physical Exam:  GEN well developed, well nourished, no acute distress   HEENT PERRL   NECK No masses  trachea midline   RESP normal resp effort  clear BS   CARD regular rate  no murmur   ABD denies tenderness  soft  no Adominal Mass    Assessment/Admission Diagnosis MO   Plan sleeve gastrectomy   Electronic Signatures: Geoffry ParadiseBruce, Melitta Tigue M (MD)  (Signed 11-Nov-14 07:35)  Authored: CHIEF COMPLAINT and HISTORY, PAST MEDICAL/SURGIAL HISTORY, ALLERGIES, REVIEW OF SYSTEMS, PHYSICAL EXAM, ASSESSMENT AND PLAN   Last Updated: 11-Nov-14 07:35 by Geoffry ParadiseBruce, Brylan Seubert M (MD)

## 2014-07-18 NOTE — Op Note (Signed)
PATIENT NAME:  Ashley [name], Ashley [name] MR#:  [id number] DATE OF BIRTH:  [birth year]  DATE OF PROCEDURE:  02/05/2013  PREOPERATIVE DIAGNOSIS: Morbid obesity.  POSTOPERATIVE DIAGNOSIS: Morbid obesity and hiatal hernia.   PROCEDURE: Laparoscopic sleeve gastrectomy with hiatal hernia repair and lysis of adhesions.   SURGEON: Primus Bravo, M.D.  ASSISTANT: Mariella Saa, PA-C  COMPLICATIONS: None.   ESTIMATED BLOOD LOSS: Negligible.   ANESTHESIA: General endotracheal.   SPECIMENS: Remnant portion of stomach to pathology.   CLINICAL HISTORY: See H and P.   SURGEON: Primus Bravo, MD  ASSISTANT: Ashley Sos, PA-C  CLINICAL HISTORY: See History and Physical.  DETAILS OF PROCEDURE:  The patient was taken to the operating room and placed on the operating room table in the supine, monitors and supplemental oxygen being delivered. Broad spectrum IV antibiotics were administered. The patient was placed under general anesthesia without incident. The abdomen was prepped and draped in the usual sterile fashion. Access was obtained using 5 mm Optical trocar in the left upper outer quadrant. Pneumoperitoneum was established. Multiple other ports were placed in preparation for sleeve gastrectomy and liver retractor placed. At that point, a moderate hiatal hernia was identified. The Harmonic scalpel was utilized to take down the pars flaccida of the right crura and the right crura was fully mobilized. The esophagus was retracted anteriorly showing the crural junction and the moderate hiatal hernia. The posterior vagus nerve was preserved throughout the entire dissection.  The posterior crural repair was performed using interrupted 0 Surgidac suture. At that point, attention was then placed to the stomach. The greater curvature vascular was mobilized from 5 to 7 cm from the pylorus all the way up to the left crura. The posterior attachments were taken down as well completely mobilizing the fundus and the greater  curvature of the stomach so the crow's feet could be fully visualized all the way up. A 34 [place] Bougie was then inserted transorally down into the distal antrum and several sequential firings starting with an echelon green load used to bisect the antrum between the lesser curvature and the greater curvature and then sequential firings of the blue load through the opposite trocar. Using the technique of Hargroder all the way up to just lateral to the GE junction. Staples were all formed very nicely. No evidence of leak was present. No evidence of bleeding was present. The specimen was retrieved and sent for pathologic evaluation. The entire operation blood loss was less than 250 mL. No complications occurred during the entire procedure. I performed upper endoscopy and a nice curvilinear tube was present without any impingement upon the linea angularis. I was quite pleased with the outcome. I used the suction and sucked out the extra gas. I then regowned and regloved and pulled out the delivery retractor as the specimen had already been retrieved. The wounds were closed with 4-0 Vicryl and Dermabond. The patient tolerated the procedure well, was taken to the recovery room in stable condition, and will be admitted for approximately two days for care.    ADDENDUMS:  1.  The hiatal hernia was closed in my usual fashion by coapting the posterior crura after careful dissection preserving the esophagus and the posterior vagus nerve with interrupted 0 Surgidac sutures.  2.  The 36-French ViSiGi was used in lieu of the 34-French bougie.  3.  Seam Guard was used on all fires. 4.  No provocative leak test was performed and the sleeve started at 5 cm from the pylorus.  ____________________________ Primus BravoJon Braeton Wolgamott, MD jb:sb D: 02/05/2013 09:14:26 ET T: 02/05/2013 09:22:38 ET JOB#: [id number]  cc: Primus BravoJon Jasiya Markie, MD, <Dictator>  Geoffry ParadiseJON M Meili Kleckley MD ELECTRONICALLY SIGNED 02/12/2013 12:55

## 2014-11-21 ENCOUNTER — Other Ambulatory Visit: Payer: Self-pay | Admitting: Obstetrics and Gynecology

## 2014-11-24 ENCOUNTER — Telehealth: Payer: Self-pay

## 2014-12-08 ENCOUNTER — Encounter: Payer: Self-pay | Admitting: Pharmacist

## 2014-12-08 ENCOUNTER — Other Ambulatory Visit: Payer: Self-pay | Admitting: Pharmacist

## 2014-12-08 NOTE — Patient Outreach (Signed)
[facility]) Care Management  [facility]   12/08/2014  KABAO LEITE Nibblett 1965-04-03 [id number]  Subjective: Ashley [name] is a 49 year old female who is participating in the Employee-sponsored Link to Wellness Diabetes program.  She presents for her 6 month follow up visit.  She has no complaints today.  Patient reports her most recent A1c was 5%.  She is currently monitoring her glucose PRN as directed by her primary care provider.  She denies any hypoglycemic episodes.  Patient reports she is not currently on pharmacotherapy for diabetes, and is managing her diabetes with diet and exercise.  Patient had gastric bypass surgery and follows a strict diet.  She eats approximately 6 small meals daily and reports following a low carbohydrate diet (no bread, pasta, potatoes).  She reports going to the gym twice weekly for at least one hour and alternates walking and using other machines.  She also walks with a friend once weekly at the park for 1-1.5 miles.    Objective:  Blood pressure: 108/70  Current Medications: Current Outpatient Prescriptions  Medication Sig Dispense Refill  . albuterol (PROVENTIL HFA;VENTOLIN HFA) 108 (90 BASE) MCG/ACT inhaler Inhale 2 puffs into the lungs every 6 (six) hours as needed for wheezing or shortness of breath.    . Biotin 2500 MCG CAPS Take 5,000 mcg by mouth daily.    . budesonide-formoterol (SYMBICORT) 80-4.5 MCG/ACT inhaler Inhale 2 puffs into the lungs daily as needed.    . busPIRone (BUSPAR) 15 MG tablet Take 15 mg by mouth 2 (two) times daily as needed.    . Cholecalciferol 1000 UNITS tablet Take 5,000 Units by mouth daily.    Marland Kitchen EPINEPHrine (EPIPEN 2-PAK) 0.3 mg/0.3 mL IJ SOAJ injection Inject 0.3 mg into the muscle as needed (for anaphylaxis).    Marland Kitchen escitalopram (LEXAPRO) 10 MG tablet Take 10 mg by mouth daily.    . ferrous sulfate 325 (65 FE) MG tablet Take 650 mg by mouth 2 (two) times daily with a meal.    .  fluticasone (FLONASE) 50 MCG/ACT nasal spray Place 1 spray into both nostrils daily.    . Multiple Vitamins-Minerals (MULTIVITAMIN WITH MINERALS) tablet Take 1 tablet by mouth daily.    Marland Kitchen omeprazole (PRILOSEC) 40 MG capsule Take 40 mg by mouth 2 (two) times daily.    . polycarbophil (FIBER LAXATIVE) 625 MG tablet Take 625 mg by mouth daily.     No current facility-administered medications for this visit.   Functional Status: In your present state of health, do you have any difficulty performing the following activities: 12/08/2014  Hearing? Y  Vision? Y  Difficulty concentrating or making decisions? N  Walking or climbing stairs? N  Dressing or bathing? N  Doing errands, shopping? N   Fall/Depression Screening: PHQ 2/9 Scores 12/08/2014  PHQ - 2 Score 0   Assessment: 1. Diabetes: Patient's A1c is at goal of less than 7%.  Controlled by diet and exercise.  Patient is not currently on pharmacotherapy for diabetes.   2.  Hypertension:  Patients BP is at goal of less than 140/90.  Patient is not currently on any antihypertensive medications.   Plan: 1.  Mrs. [name] will continue to exercise at least 150 minutes per week and eat a low carbohydrate diet.   2.  Patient will schedule an annual eye exam at the beginning of 2017.   3.  She will follow up with Pharmacy in 6 months on 06/08/15.  [facility] CM Care Plan Problem One        Most Recent Value   Care Plan Problem One  Potential for elevated blood glucose   Role Documenting the Problem One  Clinical Pharmacist   Care Plan for Problem One  Active   [facility] Long Term Goal (31-90 days)  Will maintain A1c less than 7% in the next 90 days per patient report.     [facility] Long Term Goal Start Date  12/08/14   Interventions for Problem One Long Term Goal  Discussed importance of diet and exercise for blood glucose control.  Patient had gastric surgery and follows very strict diet.  Patient will work toward increasing activity level and will  aim to lose 10 lbs in the next 6 months.       Lilla Shook, Pharm.D. Pharmacy Resident [facility]

## 2014-12-09 ENCOUNTER — Encounter: Payer: Self-pay | Admitting: Pharmacist

## 2014-12-19 ENCOUNTER — Ambulatory Visit: Payer: Self-pay | Admitting: Dietician

## 2015-01-01 ENCOUNTER — Encounter: Payer: Self-pay | Admitting: Dietician

## 2015-01-01 ENCOUNTER — Encounter: Payer: 59 | Attending: Specialist | Admitting: Dietician

## 2015-01-01 VITALS — Ht 66.0 in | Wt 229.9 lb

## 2015-01-01 DIAGNOSIS — E669 Obesity, unspecified: Secondary | ICD-10-CM

## 2015-01-01 DIAGNOSIS — Z9884 Bariatric surgery status: Secondary | ICD-10-CM | POA: Diagnosis not present

## 2015-01-01 NOTE — Patient Instructions (Signed)
   Track protein intake throughout the day, add grams of protein from each food or protein drink. Aim for 60-80g protein daily.   Count fluid from your protein drinks towards your total fluid intake for the day.   Gradually increase physical activity as you are able.

## 2015-01-01 NOTE — Progress Notes (Signed)
Medical Nutrition Therapy: Visit start time: 1330  end time: 1430  Assessment:  Diagnosis: post-bariatric surgery Past medical history: Diabetes (?remission), diverticulosis, GERD, sleep apnea Psychosocial issues/ stress concerns: patient reports moderate stress level Preferred learning method:  . Hands-on  Current weight: 229.9lbs  Height: 5'6" Medications, supplements: reviewed list in chart with patient  Progress and evaluation: Patient reports she still has very limited capacity in her stomach.         She states she is drinking 2-3 premier protein shakes per week.          She eats 3 fruits and 2 veggies daily          Feels she might be overeating protein, sometimes over 100g daily when tracking intake.         She states she has not continuously followed her bariatric diet over the past year, as both parents passed away within a few months last year.   Physical activity: walking 1 hour, 2 times per week; less in past month due to husband having a heart attack.  Dietary Intake:  Usual eating pattern includes 3 meals and 3 snacks per day. Dining out frequency: unknown meals per week.  Breakfast: protein shake 30-35g total sometimes with extra protein or a banana; takes several hours to finish. Banana or [place] stick (louis rich) Snack: boiled egg, maybe yogurt (1/2 of a cup, finishes 2 hours later) Lunch: [place] stick, cheese, 1/2 small bowl of salad with romaine.  Snack: beef jerky or atkins or weight watchers bar or yogurt, fruit with cheese Supper: vegetable sweet potato, carrots, chicken often, pork chop, steak. Avoids processed meats, pasta, white potatoes.  Snack: cracker, craving salty food at night Beverages: drinks 32-64oz water, coffee daily. Some unsweetened tea.   Nutrition Care Education: Topics covered: bariatric diet 21 months post-surgery Basic nutrition: basic food groups, appropriate nutrient balance, appropriate meal and snack schedule, general nutrition  guidelines    Bariatric diet: protein needs calculated at 68g daily minimum; advised 70-80g daily. Encouraged eating within 20-30 minutes, then waiting 3 hours before next meal or snack.    Discussed healthy food choices and appropriate portions, provided menus for 1000-1200kcals.  Other lifestyle changes:  Goals for exercise  Nutritional Diagnosis:  NI-5.7.2 Excessive protein intake As related to consumption of protein supplements and foods amounting to >100g daily.  As evidenced by patient report.  Intervention: Instruction as noted above.   Patient will track protein intake and adjust to recommended goal if needed.    Patient will return for follow-un 4-5 weeks, will consult work schedule first.   Insurance underwriter given:  Marland Kitchen Sample meal pattern/ menus 1000-1200kcal bariatric menus (AND) . Meal Patterns (AND) . Goals/ instructions   Learner/ who was taught:  . Patient   Level of understanding: Marland Kitchen Verbalizes/ demonstrates competency  Demonstrated degree of understanding via:   Teach back Learning barriers: . None  Willingness to learn/ readiness for change: . Eager, change in progress   Monitoring and Evaluation:  Dietary intake, exercise, and body weight      follow up: in several week(s)

## 2015-02-26 ENCOUNTER — Other Ambulatory Visit: Payer: Self-pay | Admitting: Obstetrics and Gynecology

## 2015-02-26 ENCOUNTER — Ambulatory Visit
Admission: RE | Admit: 2015-02-26 | Discharge: 2015-02-26 | Disposition: A | Discharge status: Home / Self Care | Payer: 59 | Source: Ambulatory Visit | Attending: Obstetrics and Gynecology | Admitting: Obstetrics and Gynecology

## 2015-02-26 DIAGNOSIS — Z1231 Encounter for screening mammogram for malignant neoplasm of breast: Secondary | ICD-10-CM

## 2015-04-07 DIAGNOSIS — K912 Postsurgical malabsorption, not elsewhere classified: Secondary | ICD-10-CM | POA: Diagnosis not present

## 2015-04-07 DIAGNOSIS — Z9884 Bariatric surgery status: Secondary | ICD-10-CM | POA: Diagnosis not present

## 2015-04-12 ENCOUNTER — Encounter: Payer: Self-pay | Admitting: Emergency Medicine

## 2015-04-12 ENCOUNTER — Observation Stay: Payer: 59

## 2015-04-12 ENCOUNTER — Emergency Department: Payer: 59

## 2015-04-12 ENCOUNTER — Observation Stay
Admission: EM | Admit: 2015-04-12 | Discharge: 2015-04-13 | Disposition: A | Discharge status: Home / Self Care | Payer: 59 | Attending: Internal Medicine | Admitting: Internal Medicine

## 2015-04-12 DIAGNOSIS — K219 Gastro-esophageal reflux disease without esophagitis: Secondary | ICD-10-CM | POA: Diagnosis not present

## 2015-04-12 DIAGNOSIS — M549 Dorsalgia, unspecified: Secondary | ICD-10-CM | POA: Diagnosis not present

## 2015-04-12 DIAGNOSIS — D649 Anemia, unspecified: Secondary | ICD-10-CM | POA: Diagnosis not present

## 2015-04-12 DIAGNOSIS — Z825 Family history of asthma and other chronic lower respiratory diseases: Secondary | ICD-10-CM | POA: Diagnosis not present

## 2015-04-12 DIAGNOSIS — E119 Type 2 diabetes mellitus without complications: Secondary | ICD-10-CM | POA: Diagnosis not present

## 2015-04-12 DIAGNOSIS — Z833 Family history of diabetes mellitus: Secondary | ICD-10-CM | POA: Insufficient documentation

## 2015-04-12 DIAGNOSIS — G8929 Other chronic pain: Secondary | ICD-10-CM | POA: Diagnosis not present

## 2015-04-12 DIAGNOSIS — Z7951 Long term (current) use of inhaled steroids: Secondary | ICD-10-CM | POA: Diagnosis not present

## 2015-04-12 DIAGNOSIS — M545 Low back pain: Secondary | ICD-10-CM | POA: Diagnosis not present

## 2015-04-12 DIAGNOSIS — M25559 Pain in unspecified hip: Secondary | ICD-10-CM | POA: Diagnosis not present

## 2015-04-12 DIAGNOSIS — J45909 Unspecified asthma, uncomplicated: Secondary | ICD-10-CM | POA: Diagnosis not present

## 2015-04-12 DIAGNOSIS — Z9049 Acquired absence of other specified parts of digestive tract: Secondary | ICD-10-CM | POA: Diagnosis not present

## 2015-04-12 DIAGNOSIS — Z9109 Other allergy status, other than to drugs and biological substances: Secondary | ICD-10-CM | POA: Diagnosis not present

## 2015-04-12 DIAGNOSIS — Z841 Family history of disorders of kidney and ureter: Secondary | ICD-10-CM | POA: Insufficient documentation

## 2015-04-12 DIAGNOSIS — Z880 Allergy status to penicillin: Secondary | ICD-10-CM | POA: Diagnosis not present

## 2015-04-12 DIAGNOSIS — Z91018 Allergy to other foods: Secondary | ICD-10-CM | POA: Insufficient documentation

## 2015-04-12 DIAGNOSIS — Z803 Family history of malignant neoplasm of breast: Secondary | ICD-10-CM | POA: Diagnosis not present

## 2015-04-12 DIAGNOSIS — Z9889 Other specified postprocedural states: Secondary | ICD-10-CM | POA: Insufficient documentation

## 2015-04-12 DIAGNOSIS — Z8249 Family history of ischemic heart disease and other diseases of the circulatory system: Secondary | ICD-10-CM | POA: Diagnosis not present

## 2015-04-12 DIAGNOSIS — Z79899 Other long term (current) drug therapy: Secondary | ICD-10-CM | POA: Insufficient documentation

## 2015-04-12 DIAGNOSIS — G4733 Obstructive sleep apnea (adult) (pediatric): Secondary | ICD-10-CM | POA: Diagnosis not present

## 2015-04-12 DIAGNOSIS — Z888 Allergy status to other drugs, medicaments and biological substances status: Secondary | ICD-10-CM | POA: Diagnosis not present

## 2015-04-12 DIAGNOSIS — R52 Pain, unspecified: Secondary | ICD-10-CM | POA: Diagnosis present

## 2015-04-12 DIAGNOSIS — M1991 Primary osteoarthritis, unspecified site: Secondary | ICD-10-CM | POA: Diagnosis not present

## 2015-04-12 DIAGNOSIS — Z887 Allergy status to serum and vaccine status: Secondary | ICD-10-CM | POA: Insufficient documentation

## 2015-04-12 DIAGNOSIS — F329 Major depressive disorder, single episode, unspecified: Secondary | ICD-10-CM | POA: Insufficient documentation

## 2015-04-12 LAB — GLUCOSE, CAPILLARY
Glucose-Capillary: 93 mg/dL (ref 65–99)
Glucose-Capillary: 78 mg/dL (ref 65–99)
Glucose-Capillary: 139 mg/dL — ABNORMAL HIGH (ref 65–99)
Glucose-Capillary: 161 mg/dL — ABNORMAL HIGH (ref 65–99)

## 2015-04-12 LAB — TSH: TSH: 5.081 u[IU]/mL — ABNORMAL HIGH (ref 0.350–4.500)

## 2015-04-12 LAB — CBC
HCT: 36.9 % (ref 35.0–47.0)
Hemoglobin: 12 g/dL (ref 12.0–16.0)
MCH: 27.5 pg (ref 26.0–34.0)
MCHC: 32.4 g/dL (ref 32.0–36.0)
MCV: 84.9 fL (ref 80.0–100.0)
PLATELETS: 331 10*3/uL (ref 150–440)
RBC: 4.35 MIL/uL (ref 3.80–5.20)
RDW: 13.7 % (ref 11.5–14.5)
WBC: 6.7 10*3/uL (ref 3.6–11.0)

## 2015-04-12 LAB — COMPREHENSIVE METABOLIC PANEL
ALK PHOS: 89 U/L (ref 38–126)
ALT: 42 U/L (ref 14–54)
AST: 31 U/L (ref 15–41)
Albumin: 3.8 g/dL (ref 3.5–5.0)
Anion gap: 3 — ABNORMAL LOW (ref 5–15)
BILIRUBIN TOTAL: 0.6 mg/dL (ref 0.3–1.2)
BUN: 24 mg/dL — AB (ref 6–20)
CALCIUM: 9.7 mg/dL (ref 8.9–10.3)
CHLORIDE: 108 mmol/L (ref 101–111)
CO2: 28 mmol/L (ref 22–32)
CREATININE: 0.71 mg/dL (ref 0.44–1.00)
Glucose, Bld: 135 mg/dL — ABNORMAL HIGH (ref 65–99)
Potassium: 3.8 mmol/L (ref 3.5–5.1)
SODIUM: 139 mmol/L (ref 135–145)
TOTAL PROTEIN: 7.6 g/dL (ref 6.5–8.1)

## 2015-04-12 LAB — HEMOGLOBIN A1C: Hgb A1c MFr Bld: 6 % (ref 4.0–6.0)

## 2015-04-12 MED ORDER — BUSPIRONE HCL 5 MG PO TABS
15.0000 mg | ORAL_TABLET | Freq: Two times a day (BID) | ORAL | Status: DC
  Filled 2015-04-12 (×2): qty 3

## 2015-04-12 MED ORDER — INSULIN ASPART 100 UNIT/ML ~~LOC~~ SOLN
0.0000 [IU] | Freq: Three times a day (TID) | SUBCUTANEOUS | Status: DC
  Administered 2015-04-12 – 2015-04-13 (×2): 1 [IU] via SUBCUTANEOUS
  Filled 2015-04-12 (×2): qty 1

## 2015-04-12 MED ORDER — METOCLOPRAMIDE HCL 5 MG/ML IJ SOLN
10.0000 mg | Freq: Once | INTRAMUSCULAR | Status: AC
  Administered 2015-04-12: 10 mg via INTRAVENOUS
  Filled 2015-04-12: qty 2

## 2015-04-12 MED ORDER — SODIUM CHLORIDE 0.9 % IV SOLN
INTRAVENOUS | Status: DC
  Administered 2015-04-12: 16:00:00 via INTRAVENOUS

## 2015-04-12 MED ORDER — METHYLPREDNISOLONE SODIUM SUCC 40 MG IJ SOLR
40.0000 mg | Freq: Three times a day (TID) | INTRAMUSCULAR | Status: DC
  Administered 2015-04-12 – 2015-04-13 (×3): 40 mg via INTRAVENOUS
  Filled 2015-04-12 (×3): qty 1

## 2015-04-12 MED ORDER — MORPHINE SULFATE (PF) 4 MG/ML IV SOLN
4.0000 mg | Freq: Once | INTRAVENOUS | Status: AC
  Administered 2015-04-12: 4 mg via INTRAVENOUS
  Filled 2015-04-12: qty 1

## 2015-04-12 MED ORDER — ONDANSETRON HCL 4 MG/2ML IJ SOLN
4.0000 mg | Freq: Once | INTRAMUSCULAR | Status: AC
  Administered 2015-04-12: 4 mg via INTRAVENOUS
  Filled 2015-04-12: qty 2

## 2015-04-12 MED ORDER — ACETAMINOPHEN 650 MG RE SUPP: 650.0000 mg | Freq: Four times a day (QID) | RECTAL | Status: DC | PRN

## 2015-04-12 MED ORDER — DOCUSATE SODIUM 100 MG PO CAPS
100.0000 mg | ORAL_CAPSULE | Freq: Two times a day (BID) | ORAL | Status: DC
  Administered 2015-04-12 – 2015-04-13 (×3): 100 mg via ORAL
  Filled 2015-04-12 (×3): qty 1

## 2015-04-12 MED ORDER — BIOTIN 2500 MCG PO CAPS: 5000.0000 ug | ORAL_CAPSULE | Freq: Every day | ORAL | Status: DC

## 2015-04-12 MED ORDER — FERROUS SULFATE 325 (65 FE) MG PO TABS
650.0000 mg | ORAL_TABLET | Freq: Two times a day (BID) | ORAL | Status: DC
  Administered 2015-04-12 – 2015-04-13 (×3): 650 mg via ORAL
  Filled 2015-04-12 (×3): qty 2

## 2015-04-12 MED ORDER — HEPARIN SODIUM (PORCINE) 5000 UNIT/ML IJ SOLN
5000.0000 [IU] | Freq: Three times a day (TID) | INTRAMUSCULAR | Status: DC
  Administered 2015-04-12 – 2015-04-13 (×4): 5000 [IU] via SUBCUTANEOUS
  Filled 2015-04-12 (×4): qty 1

## 2015-04-12 MED ORDER — VITAMIN D 1000 UNITS PO TABS
5000.0000 [IU] | ORAL_TABLET | Freq: Every day | ORAL | Status: DC
  Administered 2015-04-12 – 2015-04-13 (×2): 5000 [IU] via ORAL
  Filled 2015-04-12 (×2): qty 5

## 2015-04-12 MED ORDER — ONDANSETRON HCL 4 MG PO TABS: 4.0000 mg | ORAL_TABLET | Freq: Four times a day (QID) | ORAL | Status: DC | PRN

## 2015-04-12 MED ORDER — ONDANSETRON HCL 4 MG/2ML IJ SOLN
4.0000 mg | Freq: Four times a day (QID) | INTRAMUSCULAR | Status: DC | PRN
  Administered 2015-04-12: 4 mg via INTRAVENOUS
  Filled 2015-04-12: qty 2

## 2015-04-12 MED ORDER — EPINEPHRINE 0.3 MG/0.3ML IJ SOAJ: 0.3000 mg | INTRAMUSCULAR | Status: DC | PRN

## 2015-04-12 MED ORDER — DIAZEPAM 5 MG/ML IJ SOLN
5.0000 mg | Freq: Once | INTRAMUSCULAR | Status: AC
Start: 2015-04-12 — End: 2015-04-12
  Administered 2015-04-12: 5 mg via INTRAVENOUS
  Filled 2015-04-12: qty 2

## 2015-04-12 MED ORDER — MORPHINE SULFATE (PF) 4 MG/ML IV SOLN
4.0000 mg | INTRAVENOUS | Status: DC | PRN
  Administered 2015-04-12 – 2015-04-13 (×4): 4 mg via INTRAVENOUS
  Filled 2015-04-12 (×4): qty 1

## 2015-04-12 MED ORDER — FLUTICASONE PROPIONATE 50 MCG/ACT NA SUSP
1.0000 | Freq: Every day | NASAL | Status: DC
  Administered 2015-04-13: 1 via NASAL
  Filled 2015-04-12: qty 16

## 2015-04-12 MED ORDER — DIAZEPAM 5 MG/ML IJ SOLN
5.0000 mg | Freq: Three times a day (TID) | INTRAMUSCULAR | Status: DC | PRN
  Administered 2015-04-12: 5 mg via INTRAVENOUS
  Filled 2015-04-12: qty 2

## 2015-04-12 MED ORDER — CYCLOBENZAPRINE HCL 10 MG PO TABS
5.0000 mg | ORAL_TABLET | Freq: Three times a day (TID) | ORAL | Status: DC | PRN
  Administered 2015-04-12 – 2015-04-13 (×2): 5 mg via ORAL
  Filled 2015-04-12 (×2): qty 1

## 2015-04-12 MED ORDER — PANTOPRAZOLE SODIUM 40 MG PO TBEC
80.0000 mg | DELAYED_RELEASE_TABLET | Freq: Every day | ORAL | Status: DC
  Administered 2015-04-12 – 2015-04-13 (×2): 80 mg via ORAL
  Filled 2015-04-12 (×2): qty 2

## 2015-04-12 MED ORDER — INSULIN ASPART 100 UNIT/ML ~~LOC~~ SOLN: 0.0000 [IU] | Freq: Every day | SUBCUTANEOUS | Status: DC

## 2015-04-12 MED ORDER — METOCLOPRAMIDE HCL 5 MG/ML IJ SOLN
5.0000 mg | Freq: Four times a day (QID) | INTRAMUSCULAR | Status: DC | PRN
  Administered 2015-04-12: 5 mg via INTRAVENOUS
  Filled 2015-04-12: qty 2

## 2015-04-12 MED ORDER — ACETAMINOPHEN 325 MG PO TABS: 650.0000 mg | ORAL_TABLET | Freq: Four times a day (QID) | ORAL | Status: DC | PRN

## 2015-04-12 MED ORDER — ALBUTEROL SULFATE (2.5 MG/3ML) 0.083% IN NEBU: 3.0000 mL | INHALATION_SOLUTION | Freq: Four times a day (QID) | RESPIRATORY_TRACT | Status: DC | PRN

## 2015-04-12 MED ORDER — ESCITALOPRAM OXALATE 10 MG PO TABS
10.0000 mg | ORAL_TABLET | Freq: Every day | ORAL | Status: DC
  Administered 2015-04-12: 10 mg via ORAL
  Filled 2015-04-12 (×2): qty 1

## 2015-04-12 MED ORDER — ADULT MULTIVITAMIN W/MINERALS CH
1.0000 | ORAL_TABLET | Freq: Every day | ORAL | Status: DC
  Administered 2015-04-12 – 2015-04-13 (×2): 1 via ORAL
  Filled 2015-04-12 (×2): qty 1

## 2015-04-12 MED ORDER — CALCIUM POLYCARBOPHIL 625 MG PO TABS
625.0000 mg | ORAL_TABLET | Freq: Every day | ORAL | Status: DC
Start: 2015-04-12 — End: 2015-04-13
  Administered 2015-04-12 – 2015-04-13 (×2): 625 mg via ORAL
  Filled 2015-04-12 (×2): qty 1

## 2015-04-12 MED ORDER — BUDESONIDE-FORMOTEROL FUMARATE 80-4.5 MCG/ACT IN AERO
2.0000 | INHALATION_SPRAY | Freq: Two times a day (BID) | RESPIRATORY_TRACT | Status: DC
  Administered 2015-04-12 – 2015-04-13 (×2): 2 via RESPIRATORY_TRACT
  Filled 2015-04-12: qty 6.9

## 2015-04-12 NOTE — H&P (Addendum)
Ashley [name] is an 50 y.o. female.   Chief Complaint: Back pain HPI: The patient presents emergency department complaining of back pain. She states that she was ambulating without difficulty today when she walks through her home and suddenly felt a sharp pain in her lower back. He has fractured 2 lumbar vertebrae in the past. She denies heavy lifting or abrupt movements. She complains of difficulty ambulating but denies numbness in her legs or feet. In the emergency department the patient received multiple doses of analgesic without relief which prompted the emergency department staff to call for admission.  Past Medical History  Diagnosis Date  . Allergy   . Anemia   . Arthritis   . Asthma   . Blood transfusion without reported diagnosis   . Diabetes mellitus without complication ([place])   . GERD (gastroesophageal reflux disease)   . Sleep apnea     Past Surgical History  Procedure Laterality Date  . Cesarean section    . Cholecystectomy    . Hernia repair    . Tubal ligation    . Fracture surgery      left ankle complete repair, left elbow    Family History  Problem Relation Age of Onset  . Diabetes Mother   . Hypertension Mother   . Cancer Mother   . Asthma Mother   . Diabetes Father   . Hyperlipidemia Father   . Hypertension Father   . Kidney disease Father   . Heart disease Father   . Diabetes Maternal Aunt   . Diabetes Maternal Uncle   . Diabetes Paternal Aunt   . Diabetes Paternal Uncle   . Breast cancer Maternal Grandmother 60   Social History:  reports that she has never smoked. She does not have any smokeless tobacco history on file. She reports that she does not drink alcohol or use illicit drugs.  Allergies:  Allergies  Allergen Reactions  . Influenza Vac Split [Flu Virus Vaccine] Anaphylaxis  . Metformin Diarrhea  . Orange Fruit [Citrus] Itching  . Dilaudid [Hydromorphone] Itching  . Penicillins Nausea And Vomiting, Rash and Other (See  Comments)    Has patient had a PCN reaction causing immediate rash, facial/tongue/throat swelling, SOB or lightheadedness with hypotension: Yes Has patient had a PCN reaction causing severe rash involving mucus membranes or skin necrosis: No Has patient had a PCN reaction that required hospitalization No Has patient had a PCN reaction occurring within the last 10 years: No If all of the above answers are "NO", then may proceed with Cephalosporin use.     Medications Prior to Admission  Medication Sig Dispense Refill  . albuterol (PROVENTIL HFA;VENTOLIN HFA) 108 (90 BASE) MCG/ACT inhaler Inhale 2 puffs into the lungs every 6 (six) hours as needed for wheezing or shortness of breath.    . Biotin 2500 MCG CAPS Take 5,000 mcg by mouth daily.    . budesonide-formoterol (SYMBICORT) 80-4.5 MCG/ACT inhaler Inhale 2 puffs into the lungs daily as needed (shortness of breath).     . busPIRone (BUSPAR) 15 MG tablet Take 15 mg by mouth 2 (two) times daily.     . Cholecalciferol 1000 UNITS tablet Take 5,000 Units by mouth daily.    Marland Kitchen EPINEPHrine (EPIPEN 2-PAK) 0.3 mg/0.3 mL IJ SOAJ injection Inject 0.3 mg into the muscle as needed (for anaphylaxis).    Marland Kitchen escitalopram (LEXAPRO) 10 MG tablet Take 10 mg by mouth daily.    . ferrous sulfate 325 (65 FE) MG tablet Take  650 mg by mouth 2 (two) times daily with a meal.    . fluticasone (FLONASE) 50 MCG/ACT nasal spray Place 1 spray into both nostrils daily.    . Multiple Vitamins-Minerals (MULTIVITAMIN WITH MINERALS) tablet Take 1 tablet by mouth daily.    Marland Kitchen omeprazole (PRILOSEC) 40 MG capsule Take 40 mg by mouth 2 (two) times daily.    . polycarbophil (FIBER LAXATIVE) 625 MG tablet Take 625 mg by mouth daily.      Results for orders placed or performed during the hospital encounter of 04/12/15 (from the past 48 hour(s))  CBC     Status: None   Collection Time: 04/12/15  2:43 AM  Result Value Ref Range   WBC 6.7 3.6 - 11.0 K/uL   RBC 4.35 3.80 - 5.20 MIL/uL    Hemoglobin 12.0 12.0 - 16.0 g/dL   HCT 36.9 35.0 - 47.0 %   MCV 84.9 80.0 - 100.0 fL   MCH 27.5 26.0 - 34.0 pg   MCHC 32.4 32.0 - 36.0 g/dL   RDW 13.7 11.5 - 14.5 %   Platelets 331 150 - 440 K/uL  Comprehensive metabolic panel     Status: Abnormal   Collection Time: 04/12/15  2:43 AM  Result Value Ref Range   Sodium 139 135 - 145 mmol/L   Potassium 3.8 3.5 - 5.1 mmol/L   Chloride 108 101 - 111 mmol/L   CO2 28 22 - 32 mmol/L   Glucose, Bld 135 (H) 65 - 99 mg/dL   BUN 24 (H) 6 - 20 mg/dL   Creatinine, Ser 0.71 0.44 - 1.00 mg/dL   Calcium 9.7 8.9 - 10.3 mg/dL   Total Protein 7.6 6.5 - 8.1 g/dL   Albumin 3.8 3.5 - 5.0 g/dL   AST 31 15 - 41 U/L   ALT 42 14 - 54 U/L   Alkaline Phosphatase 89 38 - 126 U/L   Total Bilirubin 0.6 0.3 - 1.2 mg/dL   GFR calc non Af Amer >60 >60 mL/min   GFR calc Af Amer >60 >60 mL/min    Comment: (NOTE) The eGFR has been calculated using the CKD EPI equation. This calculation has not been validated in all clinical situations. eGFR's persistently <60 mL/min signify possible Chronic Kidney Disease.    Anion gap 3 (L) 5 - 15  TSH     Status: Abnormal   Collection Time: 04/12/15  2:43 AM  Result Value Ref Range   TSH 5.081 (H) 0.350 - 4.500 uIU/mL   Dg Lumbar Spine 2-3 Views  04/12/2015  CLINICAL DATA:  Acute onset of lower back pain.  Initial encounter. EXAM: LUMBAR SPINE - 2-3 VIEW COMPARISON:  CT of the abdomen and pelvis from 05/17/2012 FINDINGS: There is no evidence of fracture or subluxation. Vertebral bodies demonstrate normal height and alignment. Slight chronic endplate irregularity is noted at the superior endplates of L1 and L3, apparently new from 2014. Intervertebral disc spaces are preserved. The visualized neural foramina are grossly unremarkable in appearance. Mild facet disease is noted at the lower lumbar spine. The visualized bowel gas pattern is unremarkable in appearance; air and stool are noted within the colon. The sacroiliac joints  are within normal limits. Clips are noted within the right upper quadrant, reflecting prior cholecystectomy. IMPRESSION: No evidence of acute fracture or subluxation along the lumbar spine. Slight chronic endplate irregularity at the superior endplates of L1 and L3, apparently new from 2014. Electronically Signed   By: Francoise Schaumann.D.  On: 04/12/2015 01:49    Review of Systems  Constitutional: Negative for fever and chills.  HENT: Negative for sore throat and tinnitus.   Eyes: Negative for blurred vision and redness.  Respiratory: Negative for cough and shortness of breath.   Cardiovascular: Negative for chest pain, palpitations, orthopnea and PND.  Gastrointestinal: Negative for nausea, vomiting, abdominal pain and diarrhea.  Genitourinary: Negative for dysuria, urgency and frequency.  Musculoskeletal: Positive for back pain. Negative for myalgias and joint pain.  Skin: Negative for rash.       No lesions  Neurological: Negative for speech change, focal weakness and weakness.  Endo/Heme/Allergies: Does not bruise/bleed easily.       No temperature intolerance  Psychiatric/Behavioral: Negative for depression and suicidal ideas.    Blood pressure 121/73, pulse 73, temperature 97.5 F (36.4 C), temperature source Oral, resp. rate 22, height _0  (1.676 m), weight 106.652 kg (235 lb 2 oz), SpO2 100 %. Physical Exam  Vitals reviewed. Constitutional: She is oriented to person, place, and time. She appears well-developed and well-nourished. No distress.  HENT:  Head: Normocephalic and atraumatic.  Mouth/Throat: Oropharynx is clear and moist.  Eyes: Conjunctivae and EOM are normal. Pupils are equal, round, and reactive to light. No scleral icterus.  Neck: Normal range of motion. Neck supple. No JVD present. No tracheal deviation present. No thyromegaly present.  Cardiovascular: Normal rate, regular rhythm and normal heart sounds.  Exam reveals no gallop and no friction rub.   No murmur  heard. Respiratory: Effort normal and breath sounds normal.  GI: Soft. Bowel sounds are normal. She exhibits no distension. There is no tenderness.  Genitourinary:  Deferred  Musculoskeletal: Normal range of motion. She exhibits tenderness (Lower back midline). She exhibits no edema.  Lymphadenopathy:    She has no cervical adenopathy.  Neurological: She is alert and oriented to person, place, and time. No cranial nerve deficit. She exhibits normal muscle tone.  Reflex Scores:      Patellar reflexes are 1+ on the right side and 1+ on the left side. Skin: Skin is warm and dry. No rash noted. No erythema.  Psychiatric: She has a normal mood and affect. Her behavior is normal. Judgment and thought content normal.     Assessment/Plan This is a 50 year old African-American female admitted for intractable back pain. 1. Back pain: Lumbar region; may be an exacerbation of previous injury or herniation of disc tissue at those sites. The patient has no neurologic weakness but does have positive straight leg signs. Reflexes are muted at the patellas bilaterally but are symmetrical. The patient has no saddle numbness. I have deferred steroids at this time as it is not entirely clear that she has a herniated disc. The patient may need an MRI of her back at some time if her mobility does not improve. Notably the patient has complained of chronic hip pain for weeks to months but denies pain in her hip today. Mechanism of injury may be secondary to change in posture with subsequent stressing of weakened vertebrae from prior injury. Manage pain; muscle relaxers as needed for now. 2. Diabetes mellitus type 2: Sliding scale insulin while hospitalized 3. Gastroesophageal reflux disease: Continue pantoprazole per home regimen 4. Shortness sleep apnea: We'll initiate CPAP per home settings the patient is in the hospital another night 5. Asthma: Stable mild intermittent. Continue albuterol and inhaled  corticosteroid 6. Depression: Continue BuSpar and Lexapro 7. DVT prophylaxis: Heparin 8. GI prophylaxis: As above The patient is a full  code. Time spent on admission orders and patient care approximately 45 minutes  Harrie Foreman 04/12/2015, 7:26 AM

## 2015-04-12 NOTE — Progress Notes (Signed)
PHARMACIST - PHYSICIAN ORDER COMMUNICATION  CONCERNING: P&T Medication Policy on Herbal Medications  DESCRIPTION:  This patient's order for:  biotin  has been noted.  This product(s) is classified as an "herbal" or natural product. Due to a lack of definitive safety studies or FDA approval, nonstandard manufacturing practices, plus the potential risk of unknown drug-drug interactions while on inpatient medications, the Pharmacy and Therapeutics Committee does not permit the use of "herbal" or natural products of this type within [facility].   ACTION TAKEN: The pharmacy department is unable to verify this order at this time Please reevaluate patient's clinical condition at discharge and address if the herbal or natural product(s) should be resumed at that time.   

## 2015-04-12 NOTE — ED Provider Notes (Signed)
[facility] Emergency Department Provider Note  ____________________________________________  Time seen: On arrival  I have reviewed the triage vital signs and the nursing notes.   HISTORY  Chief Complaint Back Pain    HPI Ashley [name] is a 50 y.o. female who presents with back pain. Patient reports pain started at 1 PM today. There was no trauma to the area. She has no abdominal pain. She reports the pain is severe and is worsened by any movement. The pain is centered around T12/L1 in the bilateral paraspinal area. She denies numbness tingling or neuro deficits. She does report a history of compression fracture in her back in the past. She has had gastric bypass and is unable to take calcium supplementation because of parathyroid issues. She is not had any recent injections in her spine, no fevers or chills.     Past Medical History  Diagnosis Date  . Allergy   . Anemia   . Arthritis   . Asthma   . Blood transfusion without reported diagnosis   . Diabetes mellitus without complication ([facility])   . GERD (gastroesophageal reflux disease)   . Sleep apnea     There are no active problems to display for this patient.   Past Surgical History  Procedure Laterality Date  . Cesarean section    . Cholecystectomy    . Hernia repair    . Tubal ligation    . Fracture surgery      left ankle complete repair, left elbow    Current Outpatient Rx  Name  Route  Sig  Dispense  Refill  . albuterol (PROVENTIL HFA;VENTOLIN HFA) 108 (90 BASE) MCG/ACT inhaler   Inhalation   Inhale 2 puffs into the lungs every 6 (six) hours as needed for wheezing or shortness of breath.         . Biotin 2500 MCG CAPS   Oral   Take 5,000 mcg by mouth daily.         . budesonide-formoterol (SYMBICORT) 80-4.5 MCG/ACT inhaler   Inhalation   Inhale 2 puffs into the lungs daily as needed.         . busPIRone (BUSPAR) 15 MG tablet   Oral   Take 15 mg by mouth 2  (two) times daily as needed.         . Cholecalciferol 1000 UNITS tablet   Oral   Take 5,000 Units by mouth daily.         Marland Kitchen EPINEPHrine (EPIPEN 2-PAK) 0.3 mg/0.3 mL IJ SOAJ injection   Intramuscular   Inject 0.3 mg into the muscle as needed (for anaphylaxis).         Marland Kitchen escitalopram (LEXAPRO) 10 MG tablet   Oral   Take 10 mg by mouth daily.         . ferrous sulfate 325 (65 FE) MG tablet   Oral   Take 650 mg by mouth 2 (two) times daily with a meal.         . fluticasone (FLONASE) 50 MCG/ACT nasal spray   Each Nare   Place 1 spray into both nostrils daily.         Marland Kitchen glucose blood (ONE TOUCH ULTRA TEST) test strip               . Multiple Vitamins-Minerals (MULTIVITAMIN WITH MINERALS) tablet   Oral   Take 1 tablet by mouth daily.         Marland Kitchen omeprazole (PRILOSEC) 40 MG capsule  Oral   Take 40 mg by mouth 2 (two) times daily.         Letta Pate DELICA LANCETS 33G MISC               . polycarbophil (FIBER LAXATIVE) 625 MG tablet   Oral   Take 625 mg by mouth daily.           Allergies Influenza vac split; Metformin; Orange fruit; Dilaudid; and Penicillins  Family History  Problem Relation Age of Onset  . Diabetes Mother   . Hypertension Mother   . Cancer Mother   . Asthma Mother   . Diabetes Father   . Hyperlipidemia Father   . Hypertension Father   . Kidney disease Father   . Heart disease Father   . Diabetes Maternal Aunt   . Diabetes Maternal Uncle   . Diabetes Paternal Aunt   . Diabetes Paternal Uncle   . Breast cancer Maternal Grandmother 64    Social History Social History  Substance Use Topics  . Smoking status: Never Smoker   . Smokeless tobacco: None  . Alcohol Use: No    Review of Systems  Constitutional: Negative for fever. Eyes: Negative for visual changes. ENT: Negative for neck pain Cardiovascular: Negative for chest pain. Respiratory: Negative for cough Gastrointestinal: Negative for abdominal  pain Genitourinary: Negative for incontinence Musculoskeletal: As above Skin: Negative for rash. Neurological: Negative for focal weakness Psychiatric: No anxiety    ____________________________________________   PHYSICAL EXAM:  VITAL SIGNS: ED Triage Vitals  Enc Vitals Group     BP 04/12/15 0007 119/74 mmHg     Pulse Rate 04/12/15 0007 81     Resp 04/12/15 0007 20     Temp 04/12/15 0007 97.7 F (36.5 C)     Temp Source 04/12/15 0007 Oral     SpO2 04/12/15 0007 100 %     Weight 04/12/15 0007 230 lb (104.327 kg)     Height 04/12/15 0007 5\' 6"  (1.676 m)     Head Cir --      Peak Flow --      Pain Score 04/12/15 0008 10     Pain Loc --      Pain Edu? --      Excl. in GC? --      Constitutional: Alert and oriented. No acute distress but very uncomfortable Eyes: Conjunctivae are normal.  ENT   Head: Normocephalic and atraumatic.   Mouth/Throat: Mucous membranes are moist. Cardiovascular: Normal rate, regular rhythm. Normal and symmetric distal pulses are present in all extremities.  Respiratory: Normal respiratory effort without tachypnea nor retractions.  Gastrointestinal: Soft and non-tender in all quadrants. Genitourinary: deferred Musculoskeletal: Nontender with normal range of motion in all extremities. No lower extremity tenderness nor edema. He should with significant tenderness to palpation to the paraspinal area bilaterally around L1/T12. She has normal strength in the lower extremities. Neurologic:  Normal speech and language. No gross focal neurologic deficits are appreciated. Sensation is intact Skin:  Skin is warm, dry and intact. No rash noted. Psychiatric: Mood and affect are normal. Patient exhibits appropriate insight and judgment.  ____________________________________________    LABS (pertinent positives/negatives)  Labs Reviewed - No data to  display  ____________________________________________   EKG  None  ____________________________________________    RADIOLOGY I have personally reviewed any xrays that were ordered on this patient: Lumbar x-ray unremarkable  ____________________________________________   PROCEDURES  Procedure(s) performed: none  Critical Care performed: none  ____________________________________________  INITIAL IMPRESSION / ASSESSMENT AND PLAN / ED COURSE  Pertinent labs & imaging results that were available during my care of the patient were reviewed by me and considered in my medical decision making (see chart for details).  Patient with abrupt onset of significant back pain. Differential includes muscle spasm, slipped disc, compression fracture. Patient has allergy to Dilaudid but reports she has had morphine without difficulty in the past. She is unable to take NSAIDs because of her gastric bypass. She is taking Tylenol at home without any relief.  We will start with morphine 4 mg IV and Zofran 4 mg IV. Would like to add some Valium as well  Additional 4 morphine given ----------------------------------------- 2:45 AM on 04/12/2015 -----------------------------------------  Patient did get some relief after 8 of morphine and 5 of Valium however she reports now her pain is getting worse again. We will give 4 more of morphine at this point I feel admission is necessary given her intractable pain.  ____________________________________________   FINAL CLINICAL IMPRESSION(S) / ED DIAGNOSES  Final diagnoses:  Acute back pain  Intractable pain     Jene Everyobert Luara Faye, MD 04/12/15 [phone]
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
## 2015-04-12 NOTE — ED Notes (Signed)
Pt. States sudden onset of lower center back pain that started at 1 pm this afternoon.  Pt. States hx of fractures in back.

## 2015-04-12 NOTE — Consult Note (Signed)
  Came by to see patient but she was at MRI.  Will see tomorrow.

## 2015-04-13 DIAGNOSIS — F329 Major depressive disorder, single episode, unspecified: Secondary | ICD-10-CM | POA: Diagnosis not present

## 2015-04-13 DIAGNOSIS — G8929 Other chronic pain: Secondary | ICD-10-CM | POA: Diagnosis not present

## 2015-04-13 DIAGNOSIS — J45909 Unspecified asthma, uncomplicated: Secondary | ICD-10-CM | POA: Diagnosis not present

## 2015-04-13 DIAGNOSIS — G4733 Obstructive sleep apnea (adult) (pediatric): Secondary | ICD-10-CM | POA: Diagnosis not present

## 2015-04-13 DIAGNOSIS — E119 Type 2 diabetes mellitus without complications: Secondary | ICD-10-CM | POA: Diagnosis not present

## 2015-04-13 DIAGNOSIS — M25559 Pain in unspecified hip: Secondary | ICD-10-CM | POA: Diagnosis not present

## 2015-04-13 DIAGNOSIS — M1991 Primary osteoarthritis, unspecified site: Secondary | ICD-10-CM | POA: Diagnosis not present

## 2015-04-13 DIAGNOSIS — K219 Gastro-esophageal reflux disease without esophagitis: Secondary | ICD-10-CM | POA: Diagnosis not present

## 2015-04-13 DIAGNOSIS — M545 Low back pain: Secondary | ICD-10-CM | POA: Diagnosis not present

## 2015-04-13 LAB — GLUCOSE, CAPILLARY: Glucose-Capillary: 128 mg/dL — ABNORMAL HIGH (ref 65–99)

## 2015-04-13 MED ORDER — PREDNISONE 5 MG PO TABS: ORAL_TABLET | ORAL | Status: DC

## 2015-04-13 MED ORDER — DOCUSATE SODIUM 100 MG PO CAPS: 100.0000 mg | ORAL_CAPSULE | Freq: Every day | ORAL | Status: DC | PRN

## 2015-04-13 MED ORDER — MORPHINE SULFATE (PF) 2 MG/ML IV SOLN
2.0000 mg | Freq: Once | INTRAVENOUS | Status: AC
  Administered 2015-04-13: 2 mg via INTRAVENOUS
  Filled 2015-04-13: qty 1

## 2015-04-13 MED ORDER — PREDNISONE 5 MG PO TABS: 5.0000 mg | ORAL_TABLET | Freq: Every day | ORAL | Status: DC

## 2015-04-13 MED ORDER — ESCITALOPRAM OXALATE 10 MG PO TABS: 10.0000 mg | ORAL_TABLET | Freq: Every day | ORAL | Status: DC

## 2015-04-13 MED ORDER — OXYCODONE HCL 5 MG PO TABS: 5.0000 mg | ORAL_TABLET | ORAL | Status: DC | PRN

## 2015-04-13 MED ORDER — CYCLOBENZAPRINE HCL 5 MG PO TABS: 5.0000 mg | ORAL_TABLET | Freq: Three times a day (TID) | ORAL | Status: DC | PRN

## 2015-04-13 MED ORDER — OXYCODONE HCL 5 MG PO TABS
5.0000 mg | ORAL_TABLET | ORAL | Status: DC | PRN
  Filled 2015-04-13: qty 1

## 2015-04-13 NOTE — Progress Notes (Signed)
Patient had preference to take IV morphine.  Dr. Hilton SinclairWeiting called and telephone order received for one time order.

## 2015-04-13 NOTE — Discharge Instructions (Signed)
Back Exercises The following exercises strengthen the muscles that help to support the back. They also help to keep the lower back flexible. Doing these exercises can help to prevent back pain or lessen existing pain. If you have back pain or discomfort, try doing these exercises 2-3 times each day or as told by your health care provider. When the pain goes away, do them once each day, but increase the number of times that you repeat the steps for each exercise (do more repetitions). If you do not have back pain or discomfort, do these exercises once each day or as told by your health care provider. EXERCISES Single Knee to Chest Repeat these steps 3-5 times for each leg:  Lie on your back on a firm bed or the floor with your legs extended.  Bring one knee to your chest. Your other leg should stay extended and in contact with the floor.  Hold your knee in place by grabbing your knee or thigh.  Pull on your knee until you feel a gentle stretch in your lower back.  Hold the stretch for 10-30 seconds.  Slowly release and straighten your leg. Pelvic Tilt Repeat these steps 5-10 times:  Lie on your back on a firm bed or the floor with your legs extended.  Bend your knees so they are pointing toward the ceiling and your feet are flat on the floor.  Tighten your lower abdominal muscles to press your lower back against the floor. This motion will tilt your pelvis so your tailbone points up toward the ceiling instead of pointing to your feet or the floor.  With gentle tension and even breathing, hold this position for 5-10 seconds. Cat-Cow Repeat these steps until your lower back becomes more flexible:  Get into a hands-and-knees position on a firm surface. Keep your hands under your shoulders, and keep your knees under your hips. You may place padding under your knees for comfort.  Let your head hang down, and point your tailbone toward the floor so your lower back becomes rounded like the  back of a cat.  Hold this position for 5 seconds.  Slowly lift your head and point your tailbone up toward the ceiling so your back forms a sagging arch like the back of a cow.  Hold this position for 5 seconds. Press-Ups Repeat these steps 5-10 times:  Lie on your abdomen (face-down) on the floor.  Place your palms near your head, about shoulder-width apart.  While you keep your back as relaxed as possible and keep your hips on the floor, slowly straighten your arms to raise the top half of your body and lift your shoulders. Do not use your back muscles to raise your upper torso. You may adjust the placement of your hands to make yourself more comfortable.  Hold this position for 5 seconds while you keep your back relaxed.  Slowly return to lying flat on the floor. Bridges Repeat these steps 10 times:  Lie on your back on a firm surface.  Bend your knees so they are pointing toward the ceiling and your feet are flat on the floor.  Tighten your buttocks muscles and lift your buttocks off of the floor until your waist is at almost the same height as your knees. You should feel the muscles working in your buttocks and the back of your thighs. If you do not feel these muscles, slide your feet 1-2 inches farther away from your buttocks.  Hold this position for 3-5  seconds.  Slowly lower your hips to the starting position, and allow your buttocks muscles to relax completely. If this exercise is too easy, try doing it with your arms crossed over your chest. Abdominal Crunches Repeat these steps 5-10 times:  Lie on your back on a firm bed or the floor with your legs extended.  Bend your knees so they are pointing toward the ceiling and your feet are flat on the floor.  Cross your arms over your chest.  Tip your chin slightly toward your chest without bending your neck.  Tighten your abdominal muscles and slowly raise your trunk (torso) high enough to lift your shoulder blades a  tiny bit off of the floor. Avoid raising your torso higher than that, because it can put too much stress on your low back and it does not help to strengthen your abdominal muscles.  Slowly return to your starting position. Back Lifts Repeat these steps 5-10 times: 1. Lie on your abdomen (face-down) with your arms at your sides, and rest your forehead on the floor. 2. Tighten the muscles in your legs and your buttocks. 3. Slowly lift your chest off of the floor while you keep your hips pressed to the floor. Keep the back of your head in line with the curve in your back. Your eyes should be looking at the floor. 4. Hold this position for 3-5 seconds. 5. Slowly return to your starting position. SEEK MEDICAL CARE IF:  Your back pain or discomfort gets much worse when you do an exercise.  Your back pain or discomfort does not lessen within 2 hours after you exercise. If you have any of these problems, stop doing these exercises right away. Do not do them again unless your health care provider says that you can. SEEK IMMEDIATE MEDICAL CARE IF:  You develop sudden, severe back pain. If this happens, stop doing the exercises right away. Do not do them again unless your health care provider says that you can.   This information is not intended to replace advice given to you by your health care provider. Make sure you discuss any questions you have with your health care provider.   Document Released: 04/21/2004 Document Revised: 12/03/2014 Document Reviewed: 05/08/2014 Elsevier Interactive Patient Education 2016 [place] Injury Prevention Back injuries can be very painful. They can also be difficult to heal. After having one back injury, you are more likely to injure your back again. It is important to learn how to avoid injuring or re-injuring your back. The following tips can help you to prevent a back injury. WHAT SHOULD I KNOW ABOUT PHYSICAL FITNESS?  Exercise for 30 minutes per  day on most days of the week or as directed by your health care provider. Make sure to:  Do aerobic exercises, such as walking, jogging, biking, or swimming.  Do exercises that increase balance and strength, such as tai chi and yoga. These can decrease your risk of falling and injuring your back.  Do stretching exercises to help with flexibility.  Try to develop strong abdominal muscles. Your abdominal muscles provide a lot of the support that is needed by your back.  Maintain a healthy weight. This helps to decrease your risk of a back injury. WHAT SHOULD I KNOW ABOUT MY DIET?  Talk with your health care provider about your overall diet. Take supplements and vitamins only as directed by your health care provider.  Talk with your health care provider about how much calcium and  vitamin D you need each day. These nutrients help to prevent weakening of the bones (osteoporosis). Osteoporosis can cause broken (fractured) bones, which lead to back pain. °· Include good sources of calcium in your diet, such as dairy products, green leafy vegetables, and products that have had calcium added to them (fortified). °· Include good sources of vitamin D in your diet, such as milk and foods that are fortified with vitamin D. °WHAT SHOULD I KNOW ABOUT MY POSTURE? °· Sit up straight and stand up straight. Avoid leaning forward when you sit or hunching over when you stand. °· Choose chairs that have good low-back (lumbar) support. °· If you work at a desk, sit close to it so you do not need to lean over. Keep your chin tucked in. Keep your neck drawn back, and keep your elbows bent at a right angle. Your arms should look like the letter "L." °· Sit high and close to the steering wheel when you drive. Add a lumbar support to your car seat, if needed. °· Avoid sitting or standing in one position for very long. Take breaks to get up, stretch, and walk around at least one time every hour. Take breaks every hour if you are  driving for long periods of time. °· Sleep on your side with your knees slightly bent, or sleep on your back with a pillow under your knees. Do not lie on the front of your body to sleep. °WHAT SHOULD I KNOW ABOUT LIFTING, TWISTING, AND REACHING? °Lifting and Heavy Lifting °· Avoid heavy lifting, especially repetitive heavy lifting. If you must do heavy lifting: °· Stretch before lifting. °· Work slowly. °· Rest between lifts. °· Use a tool such as a cart or a dolly to move objects if one is available. °· Make several small trips instead of carrying one heavy load. °· Ask for help when you need it, especially when moving big objects. °· Follow these steps when lifting: °· Stand with your feet shoulder-width apart. °· Get as close to the object as you can. Do not try to pick up a heavy object that is far from your body. °· Use handles or lifting straps if they are available. °· Bend at your knees. Squat down, but keep your heels off the floor. °· Keep your shoulders pulled back, your chin tucked in, and your back straight. °· Lift the object slowly while you tighten the muscles in your legs, abdomen, and buttocks. Keep the object as close to the center of your body as possible. °· Follow these steps when putting down a heavy load: °· Stand with your feet shoulder-width apart. °· Lower the object slowly while you tighten the muscles in your legs, abdomen, and buttocks. Keep the object as close to the center of your body as possible. °· Keep your shoulders pulled back, your chin tucked in, and your back straight. °· Bend at your knees. Squat down, but keep your heels off the floor. °· Use handles or lifting straps if they are available. °Twisting and Reaching °· Avoid lifting heavy objects above your waist. °· Do not twist at your waist while you are lifting or carrying a load. If you need to turn, move your feet. °· Do not bend over without bending at your knees. °· Avoid reaching over your head, across a table, or  for an object on a high surface. °WHAT ARE SOME OTHER TIPS? °· Avoid wet floors and icy ground. Keep sidewalks clear of ice to prevent   falls. °· Do not sleep on a mattress that is too soft or too hard. °· Keep items that are used frequently within easy reach. °· Put heavier objects on shelves at waist level, and put lighter objects on lower or higher shelves. °· Find ways to decrease your stress, such as exercise, massage, or relaxation techniques. Stress can build up in your muscles. Tense muscles are more vulnerable to injury. °· Talk with your health care provider if you feel anxious or depressed. These conditions can make back pain worse. °· Wear flat heel shoes with cushioned soles. °· Avoid sudden movements. °· Use both shoulder straps when carrying a backpack. °· Do not use any tobacco products, including cigarettes, chewing tobacco, or electronic cigarettes. If you need help quitting, ask your health care provider. °  °This information is not intended to replace advice given to you by your health care provider. Make sure you discuss any questions you have with your health care provider. °  °Document Released: 04/21/2004 Document Revised: 07/29/2014 Document Reviewed: 03/18/2014 °Elsevier Interactive Patient Education ©2016 [employer] ° °Back Pain, Adult °Back pain is very common in adults. The cause of back pain is rarely dangerous and the pain often gets better over time. The cause of your back pain may not be known. Some common causes of back pain include: °· Strain of the muscles or ligaments supporting the spine. °· Wear and tear (degeneration) of the spinal disks. °· Arthritis. °· Direct injury to the back. °For many people, back pain may return. Since back pain is rarely dangerous, most people can learn to manage this condition on their own. °HOME CARE INSTRUCTIONS °Watch your back pain for any changes. The following actions may help to lessen any discomfort you are feeling: °· Remain active. It is  stressful on your back to sit or stand in one place for long periods of time. Do not sit, drive, or stand in one place for more than 30 minutes at a time. Take short walks on even surfaces as soon as you are able. Try to increase the length of time you walk each day. °· Exercise regularly as directed by your health care provider. Exercise helps your back heal faster. It also helps avoid future injury by keeping your muscles strong and flexible. °· Do not stay in bed. Resting more than 1-2 days can delay your recovery. °· Pay attention to your body when you bend and lift. The most comfortable positions are those that put less stress on your recovering back. Always use proper lifting techniques, including: °¨ Bending your knees. °¨ Keeping the load close to your body. °¨ Avoiding twisting. °· Find a comfortable position to sleep. Use a firm mattress and lie on your side with your knees slightly bent. If you lie on your back, put a pillow under your knees. °· Avoid feeling anxious or stressed. Stress increases muscle tension and can worsen back pain. It is important to recognize when you are anxious or stressed and learn ways to manage it, such as with exercise. °· Take medicines only as directed by your health care provider. Over-the-counter medicines to reduce pain and inflammation are often the most helpful. Your health care provider may prescribe muscle relaxant drugs. These medicines help dull your pain so you can more quickly return to your normal activities and healthy exercise. °· Apply ice to the injured area: °¨ Put ice in a plastic bag. °¨ Place a towel between your skin and the bag. °¨ Leave the ice on for 20 minutes, 2-3 times   a day for the first 2-3 days. After that, ice and heat may be alternated to reduce pain and spasms. °· Maintain a healthy weight. Excess weight puts extra stress on your back and makes it difficult to maintain good posture. °SEEK MEDICAL CARE IF: °· You have pain that is not relieved  with rest or medicine. °· You have increasing pain going down into the legs or buttocks. °· You have pain that does not improve in one week. °· You have night pain. °· You lose weight. °· You have a fever or chills. °SEEK IMMEDIATE MEDICAL CARE IF:  °· You develop new bowel or bladder control problems. °· You have unusual weakness or numbness in your arms or legs. °· You develop nausea or vomiting. °· You develop abdominal pain. °· You feel faint. °  °This information is not intended to replace advice given to you by your health care provider. Make sure you discuss any questions you have with your health care provider. °  °Document Released: 03/14/2005 Document Revised: 04/04/2014 Document Reviewed: 07/16/2013 °Elsevier Interactive Patient Education ©2016 [employer] ° °

## 2015-04-13 NOTE — Progress Notes (Signed)
Discharge instructions and prescriptions given with verbalized understanding.  Medicated for pain prior to discharge per MD order.  Patient taken to visitors entrance via wheelchair to be taken home by husband in personal vehicle.

## 2015-04-13 NOTE — Care Management Note (Signed)
Case Management Note  Patient Details  Name: Varonica Siharath MRN: [id number] Date of Birth: 23-Jul-1965  Subjective/Objective:                   Met with patient to discuss discharge planning. Her PCP is Dr. Caryl Comes. Diabetes controlled. She plans to go to outpatient PT at [facility] or PT at Dr. Earnestine Leys. She denies need for DME. She plans to return to works asap. She uses [facility] for Rx. She denies difficulty obtaining meds. She lives with her husband and an adult child.   Action/Plan:  No RNCM needs. Case closed.   Expected Discharge Date:                  Expected Discharge Plan:     In-House Referral:     Discharge planning Services  CM Consult  Post Acute Care Choice:  Durable Medical Equipment, Home Health Choice offered to:  Patient  DME Arranged:    DME Agency:     HH Arranged:    [place] Agency:     Status of Service:  Completed, signed off  Medicare Important Message Given:    Date Medicare IM Given:    Medicare IM give by:    Date Additional Medicare IM Given:    Additional Medicare Important Message give by:     If discussed at [place] of Stay Meetings, dates discussed:    Additional Comments:  Marshell Garfinkel, RN 04/13/2015, 9:53 AM
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
## 2015-04-13 NOTE — Progress Notes (Signed)
Patient ID: Ashley [name], female   DOB: 1965-06-02, 50 y.o.   MRN: [id number] [facility] [place] [facility]        Ashley [name] was admitted to the Hospital on 04/12/2015 and Discharged  04/13/2015 and should be excused from work/school   for 8 days starting 04/12/2015 , may return to work/school without any heavy lifting, bending or twisting.  Alford HighlandWIETING, Nyla Creason M.D on 04/13/2015,at 9:23 AM  [facility] - [place] at [facility]    Office  [phone]

## 2015-04-16 ENCOUNTER — Other Ambulatory Visit: Payer: Self-pay | Admitting: Internal Medicine

## 2015-04-16 DIAGNOSIS — R413 Other amnesia: Secondary | ICD-10-CM

## 2015-04-16 DIAGNOSIS — H539 Unspecified visual disturbance: Secondary | ICD-10-CM | POA: Diagnosis not present

## 2015-04-16 DIAGNOSIS — R51 Headache: Secondary | ICD-10-CM | POA: Diagnosis not present

## 2015-04-16 DIAGNOSIS — R519 Headache, unspecified: Secondary | ICD-10-CM

## 2015-04-21 DIAGNOSIS — S39012A Strain of muscle, fascia and tendon of lower back, initial encounter: Secondary | ICD-10-CM | POA: Diagnosis not present

## 2015-04-22 DIAGNOSIS — E669 Obesity, unspecified: Secondary | ICD-10-CM | POA: Diagnosis not present

## 2015-04-22 DIAGNOSIS — G4733 Obstructive sleep apnea (adult) (pediatric): Secondary | ICD-10-CM | POA: Diagnosis not present

## 2015-04-22 DIAGNOSIS — R51 Headache: Secondary | ICD-10-CM | POA: Diagnosis not present

## 2015-05-01 DIAGNOSIS — R300 Dysuria: Secondary | ICD-10-CM | POA: Diagnosis not present

## 2015-05-06 ENCOUNTER — Ambulatory Visit
Admission: RE | Admit: 2015-05-06 | Discharge: 2015-05-06 | Disposition: A | Discharge status: Home / Self Care | Payer: 59 | Source: Ambulatory Visit | Attending: Internal Medicine | Admitting: Internal Medicine

## 2015-05-06 DIAGNOSIS — R930 Abnormal findings on diagnostic imaging of skull and head, not elsewhere classified: Secondary | ICD-10-CM | POA: Insufficient documentation

## 2015-05-06 DIAGNOSIS — R413 Other amnesia: Secondary | ICD-10-CM | POA: Insufficient documentation

## 2015-05-06 DIAGNOSIS — I6782 Cerebral ischemia: Secondary | ICD-10-CM | POA: Insufficient documentation

## 2015-05-06 DIAGNOSIS — R51 Headache: Secondary | ICD-10-CM | POA: Diagnosis not present

## 2015-05-06 DIAGNOSIS — H539 Unspecified visual disturbance: Secondary | ICD-10-CM | POA: Diagnosis not present

## 2015-05-06 DIAGNOSIS — G4733 Obstructive sleep apnea (adult) (pediatric): Secondary | ICD-10-CM | POA: Diagnosis not present

## 2015-05-06 DIAGNOSIS — R519 Headache, unspecified: Secondary | ICD-10-CM

## 2015-05-06 MED ORDER — GADOBENATE DIMEGLUMINE 529 MG/ML IV SOLN
20.0000 mL | Freq: Once | INTRAVENOUS | Status: AC | PRN
  Administered 2015-05-06: 20 mL via INTRAVENOUS

## 2015-05-07 ENCOUNTER — Ambulatory Visit: Payer: 59 | Admitting: Physical Therapy

## 2015-05-08 DIAGNOSIS — G4733 Obstructive sleep apnea (adult) (pediatric): Secondary | ICD-10-CM | POA: Diagnosis not present

## 2015-05-12 DIAGNOSIS — K912 Postsurgical malabsorption, not elsewhere classified: Secondary | ICD-10-CM | POA: Diagnosis not present

## 2015-05-12 DIAGNOSIS — Z9884 Bariatric surgery status: Secondary | ICD-10-CM | POA: Diagnosis not present

## 2015-05-18 ENCOUNTER — Ambulatory Visit: Payer: 59 | Attending: Specialist | Admitting: Physical Therapy

## 2015-05-18 ENCOUNTER — Encounter: Payer: Self-pay | Admitting: Physical Therapy

## 2015-05-18 ENCOUNTER — Ambulatory Visit: Payer: 59 | Admitting: Physical Therapy

## 2015-05-18 DIAGNOSIS — G4733 Obstructive sleep apnea (adult) (pediatric): Secondary | ICD-10-CM | POA: Diagnosis not present

## 2015-05-18 DIAGNOSIS — E213 Hyperparathyroidism, unspecified: Secondary | ICD-10-CM | POA: Diagnosis not present

## 2015-05-18 DIAGNOSIS — E119 Type 2 diabetes mellitus without complications: Secondary | ICD-10-CM | POA: Diagnosis not present

## 2015-05-18 DIAGNOSIS — R262 Difficulty in walking, not elsewhere classified: Secondary | ICD-10-CM | POA: Insufficient documentation

## 2015-05-18 DIAGNOSIS — R531 Weakness: Secondary | ICD-10-CM | POA: Diagnosis not present

## 2015-05-18 DIAGNOSIS — M545 Low back pain, unspecified: Secondary | ICD-10-CM

## 2015-05-18 DIAGNOSIS — E559 Vitamin D deficiency, unspecified: Secondary | ICD-10-CM | POA: Diagnosis not present

## 2015-05-18 DIAGNOSIS — Z9884 Bariatric surgery status: Secondary | ICD-10-CM | POA: Diagnosis not present

## 2015-05-18 NOTE — Patient Instructions (Signed)
On Elbows (Prone)    Rise up on elbows as high as possible, keeping hips on floor. Hold _2___ seconds. Repeat __10__ times per set. Do __2__ sets per session. Do __3-5__ sessions per day.  http://orth.exer.us/93   Copyright  VHI. All rights reserved.  Extension: Lumbar - Standing (Belt)    Standing leaning backward. Stay in pain free range. Repeat _10___ times per set. Do __2__ sets per session. Do _3-5___ sessions per week.  Copyright  VHI. All rights reserved.

## 2015-05-19 NOTE — Therapy (Signed)
[facility] MAIN [facility] SERVICES [address] [place], [place], [address] Phone: [phone]   Fax:  [phone]  Physical Therapy Evaluation  Patient Details  Name: Ashley [name] MRN: [id number] Date of Birth: 06-10-1965 Referring Provider: Deeann Saint MD  Encounter Date: 05/18/2015      PT End of Session - 05/18/15 1623    Visit Number 1   Number of Visits 13   Date for PT Re-Evaluation 06/29/15   PT Start Time 1520   PT Stop Time 1610   PT Time Calculation (min) 50 min   Activity Tolerance Patient tolerated treatment well   Behavior During Therapy [facility] for tasks assessed/performed      Past Medical History  Diagnosis Date  . Allergy   . Anemia   . Asthma   . Blood transfusion without reported diagnosis   . GERD (gastroesophageal reflux disease)   . Sleep apnea   . Diabetes mellitus without complication (HCC)     diet controlled  . Arthritis     back, left elbow, left hip, left ankle     Past Surgical History  Procedure Laterality Date  . Cesarean section    . Cholecystectomy    . Hernia repair    . Tubal ligation    . Fracture surgery      left ankle complete repair, left elbow    There were no vitals filed for this visit.  Visit Diagnosis:  Bilateral low back pain without sciatica - Plan: PT plan of care cert/re-cert  Weakness - Plan: PT plan of care cert/re-cert  Difficulty walking - Plan: PT plan of care cert/re-cert      Subjective Assessment - 05/18/15 1522    Subjective 50 yo Female reports falling 2 years ago with L2/L3 fracture. She reports that on Apr 11, 2015 she took her grandchild to the pediatrician office. She reports that when she went in the house and set her bag down, she started feeling immediate low back pain; She reports that it kept getting worse with back pain with any leg or arm movement. 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 She reports being cautious with any movement. She reports wearing a LSO brace occasionally (none this week); She reports getting home TENs unit and has been using it off/on with good relief of pain. She will use it for 30 min at a time; she also uses heat/ice patches; She denies any numbness/tingling currently;    Pertinent History personal factors affecting rehab: Having to sit for prolonged periods at work, severity of pain;    How long can you sit comfortably? 30 min- 1 hour with increased pain;    How long can you stand comfortably? 2+ hours   How long can you walk comfortably? before pain started she would walk for 30 min or more 3 days out of the week; currently not walking long distances;   Diagnostic tests MRI- shows arthritis, no acute changes   Patient Stated Goals relieve back pain, strengthening,    Currently in Pain? Yes   Pain Score 6    Pain Location Back   Pain Orientation Lower   Pain Descriptors / Indicators Sore;Aching;Tightness;Pressure   Pain Type Chronic pain   Pain Onset More than a month ago   Pain Frequency Intermittent  Aggravating Factors  sitting prolonged periods of time; prolonged standing, lifting   Pain Relieving Factors rest, heat, TENs   Effect of Pain on Daily Activities decreased            [facility] PT Assessment - 05/19/15 0001    Assessment   Medical Diagnosis Low back pain   Referring Provider Deeann Saint MD   Onset Date/Surgical Date 04/11/15   Hand Dominance Right   Next MD Visit none scheduled   Prior Therapy denies any history of PT for this condition;    Precautions   Precautions None   Required Braces or Orthoses Spinal Brace  LSO brace   Spinal Brace Lumbar corset  wears as needed   Restrictions   Weight Bearing Restrictions No   Balance Screen   Has the patient fallen in the past 6 months No   Has the patient had a  decrease in activity level because of a fear of falling?  Yes   Is the patient reluctant to leave their home because of a fear of falling?  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;    Vocation Requirements sitting/standing at desk; no lifting requirement   Leisure cook, read, spend time with family;    Cognition   Overall Cognitive Status Within Functional Limits for tasks assessed   Observation/Other Assessments   Modified Oswertry 48% (severe disability)   Sensation   Light Touch Appears Intact   Additional Comments Denies any numbness/tingling;    Coordination   Gross Motor Movements are Fluid and Coordinated Yes   Fine Motor Movements are Fluid and Coordinated Yes   Posture/Postural Control   Posture Comments sits with erect posture and increased lumbar lordosis;   AROM   Lumbar Flexion 55   Lumbar Extension 10   Lumbar - Right Side Bend 25   Lumbar - Left Side Bend 35   Strength   Right Hip Flexion 4/5   Right Hip Extension 4/5   Right Hip ABduction 4+/5   Right Hip ADduction 4+/5   Left Hip Flexion 4/5   Left Hip Extension 4/5   Left Hip ABduction 4+/5   Left Hip ADduction 4+/5   Right Knee Flexion 4+/5   Right Knee Extension 4+/5   Left Knee Flexion 4+/5   Left Knee Extension 4+/5   Right Ankle Dorsiflexion 4+/5   Right Ankle Plantar Flexion 4+/5   Left Ankle Dorsiflexion 4+/5   Left Ankle Plantar Flexion 4+/5   Palpation   Spinal mobility hypomobility with increased pain during PA mobs to L1-L5; Also has referred pain to low back with thoracic PA mobs   Palpation comment reports moderate  tenderness to palpation of lumbar spine;    FABER test   findings Positive   Side --  bilaterally with pain in low back   Prone Knee Bend Test   Findings Positive   Side --  bilaterally with pain in low back   Straight Leg Raise   Findings Negative   Side  --  bilaterally   Comment tightness in hamstring with passive SLR to 60  degrees; no pain in back   Transfers   Comments able to transfer sit<>Stand without HHA but does have increased pain and weakness   Ambulation/Gait   Gait Comments ambulates independently with lumbar lordosis curve, slight increase in lateral trunk sway;   Standardized Balance Assessment   Five times sit to stand comments  22 sec (<60 yo, >10 sec indicates increased risk for falls)   10 Meter Walk 1.21 m/s without AD (community ambulator)        TREATMENT: PT initiated HEP for lumbar extension: Prone press up on elbows x10 Standing lumbar extension x5 reps;  Patient required min-moderate verbal/tactile cues for correct exercise technique.                    PT Education - 05/18/15 1623    Education provided Yes   Education Details HEP   Person(s) Educated Patient   Methods Explanation;Verbal cues;Handout   Comprehension Verbalized understanding;Returned demonstration;Verbal cues required             PT Long Term Goals - 05/19/15 1258    PT LONG TERM GOAL #1   Title Patient will be independent in home exercise program to improve strength/mobility for better functional independence with ADLs by 06/29/15   Time 6   Period Weeks   Status New   PT LONG TERM GOAL #2   Title Patient will report a worst pain of 3/10 on VAS in    low back         to improve tolerance with ADLs and reduced symptoms with activities. by 06/29/15   Time 6   Period Weeks   Status New   PT LONG TERM GOAL #3   Title Patient will reduce modified Oswestry score to <20 as to demonstrate minimal disability with ADLs including improved sleeping tolerance,  walking/sitting tolerance etc for better mobility with ADLs. by 06/29/15   Time 6   Period Weeks   Status New   PT LONG TERM GOAL #4   Title Patient will increase lumbar extension strength to at least 4/5 as to improve gross strength for sitting/standing tolerance with better erect posture for increased tolerance with ADLs.    Time 6   Period Weeks   Status New   PT LONG TERM GOAL #5   Title Patient (< 11 years old) will complete five times sit to stand test in < 10 seconds indicating an increased LE strength and improved balance. by 06/29/15   Time 6   Period Weeks   Status New               Plan - 05/18/15 1623    Clinical Impression Statement 50 yo Female reports increased lower back pain since Apr 11, 2015. Patient reports immediate onset without injury. she denies any LE pain or radicular symptoms. Patient does report increased pain with mobility, prolonged sitting/standing. She exhibits decreased lumbar extension ROM and increased pain with central PA mobs to low back. Patient would benefit from additional skilled PT intervention to improve lumbar ROM, strength and reduce pain with ADLs.    Pt will benefit from skilled therapeutic intervention in order to improve on the following deficits Decreased endurance;Obesity;Hypomobility;Decreased activity tolerance;Decreased strength;Pain;Difficulty walking;Decreased mobility;Decreased balance;Decreased range of motion;Improper body mechanics;Postural dysfunction;Decreased safety awareness   Rehab Potential Fair   Clinical Impairments Affecting Rehab Potential positive: motivated, young in age, new onset of pain; Negative: severity of pain, obesity; Patient's clinical presentation is stable as her pain is centralized  to low back and she is independent in ADLs.    PT Frequency 2x / week   PT Duration 6 weeks   PT Treatment/Interventions Cryotherapy;Electrical Stimulation;Moist Heat;Traction;Balance training;Therapeutic exercise;Therapeutic  activities;Functional mobility training;Stair training;Gait training;Ultrasound;Neuromuscular re-education;Patient/family education;Manual techniques;Dry needling   PT Next Visit Plan assess lumbar extension   PT Home Exercise Plan Initiated- see patient instructions   Consulted and Agree with Plan of Care Patient         Problem List Patient Active Problem List   Diagnosis Date Noted  . Intractable pain 04/12/2015    Larene Ascencio PT, DPT 05/19/2015, 1:01 PM  [facility] MAIN [facility] SERVICES [address] [place], [place], [address] Phone: [phone]   Fax:  [phone]  Name: Ashley [name] MRN: [id number] Date of Birth: January 16, 1966

## 2015-05-20 DIAGNOSIS — G4733 Obstructive sleep apnea (adult) (pediatric): Secondary | ICD-10-CM | POA: Diagnosis not present

## 2015-05-26 ENCOUNTER — Ambulatory Visit: Payer: 59 | Admitting: Physical Therapy

## 2015-05-26 ENCOUNTER — Encounter: Payer: Self-pay | Admitting: Physical Therapy

## 2015-05-26 DIAGNOSIS — R262 Difficulty in walking, not elsewhere classified: Secondary | ICD-10-CM | POA: Diagnosis not present

## 2015-05-26 DIAGNOSIS — R531 Weakness: Secondary | ICD-10-CM | POA: Diagnosis not present

## 2015-05-26 DIAGNOSIS — M545 Low back pain, unspecified: Secondary | ICD-10-CM

## 2015-05-26 NOTE — Therapy (Signed)
[facility] MAIN [facility] SERVICES [address] [place], [place], [address] Phone: [phone]   Fax:  [phone]  Physical Therapy Treatment  Patient Details  Name: Sharae Zappulla MRN: [id number] Date of Birth: 11-23-1965 Referring Provider: Deeann Saint MD  Encounter Date: 05/26/2015      PT End of Session - 05/26/15 1431    Visit Number 2   Number of Visits 13   Date for PT Re-Evaluation 06/29/15   PT Start Time 1405   PT Stop Time 1430   PT Time Calculation (min) 25 min   Activity Tolerance Patient tolerated treatment well   Behavior During Therapy [facility] for tasks assessed/performed      Past Medical History  Diagnosis Date  . Allergy   . Anemia   . Asthma   . Blood transfusion without reported diagnosis   . GERD (gastroesophageal reflux disease)   . Sleep apnea   . Diabetes mellitus without complication (HCC)     diet controlled  . Arthritis     back, left elbow, left hip, left ankle     Past Surgical History  Procedure Laterality Date  . Cesarean section    . Cholecystectomy    . Hernia repair    . Tubal ligation    . Fracture surgery      left ankle complete repair, left elbow    There were no vitals filed for this visit.  Visit Diagnosis:  Bilateral low back pain without sciatica  Weakness  Difficulty walking      Subjective Assessment - 05/26/15 1411    Subjective Patient presents to therapy with back brace; She reports having increaed pain over the weekend. She reports trying the exercise with no help; She also tried using the TENs and taking flexeril which didn't help much either. She reports less pain today; She was able to get into the tub. She did report increased difficulty getting dressed today; Patient denies any LE pain;    Pertinent History personal factors affecting rehab: Having to sit for prolonged periods at work, severity of pain;    How long can you sit comfortably? 30 min- 1 hour  with increased pain;    How long can you stand comfortably? 2+ hours   How long can you walk comfortably? before pain started she would walk for 30 min or more 3 days out of the week; currently not walking long distances;   Diagnostic tests MRI- shows arthritis, no acute changes   Patient Stated Goals relieve back pain, strengthening,    Currently in Pain? Yes   Pain Score 5    Pain Location Back   Pain Orientation Lower   Pain Descriptors / Indicators Aching   Pain Type Chronic pain   Pain Onset More than a month ago        TREATMENT:  Patient hooklying with pball: Lumbar trunk rotation x1 min; Posterior pelvic tilt with diaphragmatic breathing x10 reps; Double knee to chest pball stretch 10 sec hold x3 reps;  Without pball: Posterior pelvic tilt with diaphragmatic breathing with heel slides x10 each LE, with alternate march x5 each LE;     Patient required min-moderate verbal/tactile cues for correct exercise technique and to increase core abdominal stabilization with UE/LE movement.She had increased difficulty with diaphragmatic breathing and LE movement requiring increased cues for better technique;                      PT  Education - 05/26/15 1431    Education provided Yes   Education Details HEP   Person(s) Educated Patient   Methods Explanation;Verbal cues   Comprehension Verbalized understanding;Returned demonstration;Verbal cues required             PT Long Term Goals - 05/19/15 1258    PT LONG TERM GOAL #1   Title Patient will be independent in home exercise program to improve strength/mobility for better functional independence with ADLs by 06/29/15   Time 6   Period Weeks   Status New   PT LONG TERM GOAL #2   Title Patient will report a worst pain of 3/10 on VAS in    low back         to improve tolerance with ADLs and reduced symptoms with activities. by 06/29/15   Time 6   Period Weeks   Status New   PT LONG TERM GOAL #3   Title  Patient will reduce modified Oswestry score to <20 as to demonstrate minimal disability with ADLs including improved sleeping tolerance, walking/sitting tolerance etc for better mobility with ADLs. by 06/29/15   Time 6   Period Weeks   Status New   PT LONG TERM GOAL #4   Title Patient will increase lumbar extension strength to at least 4/5 as to improve gross strength for sitting/standing tolerance with better erect posture for increased tolerance with ADLs.    Time 6   Period Weeks   Status New   PT LONG TERM GOAL #5   Title Patient (< 59 years old) will complete five times sit to stand test in < 10 seconds indicating an increased LE strength and improved balance. by 06/29/15   Time 6   Period Weeks   Status New               Plan - 05/26/15 1436    Clinical Impression Statement Patient late to treatment session; Instructed patient in lumbar flexion core stabilization exercise. patient required mod VCs for correct diaphragmatic breathing and improve breathing with abdominal contraction/LE movement. Patient had increased difficulty with LE movement with exercise. She denies any increase in pain and improved flexibility at end of session; Patient would benefit from additional skilled PT intervention to improve lumbar ROM and reduce back pain;    Pt will benefit from skilled therapeutic intervention in order to improve on the following deficits Decreased endurance;Obesity;Hypomobility;Decreased activity tolerance;Decreased strength;Pain;Difficulty walking;Decreased mobility;Decreased balance;Decreased range of motion;Improper body mechanics;Postural dysfunction;Decreased safety awareness   Rehab Potential Fair   Clinical Impairments Affecting Rehab Potential positive: motivated, young in age, new onset of pain; Negative: severity of pain, obesity; Patient's clinical presentation is stable as her pain is centralized to low back and she is independent in ADLs.    PT Frequency 2x / week   PT  Duration 6 weeks   PT Treatment/Interventions Cryotherapy;Electrical Stimulation;Moist Heat;Traction;Balance training;Therapeutic exercise;Therapeutic activities;Functional mobility training;Stair training;Gait training;Ultrasound;Neuromuscular re-education;Patient/family education;Manual techniques;Dry needling   PT Next Visit Plan assess lumbar extension   PT Home Exercise Plan advanced- see patient instructions;    Consulted and Agree with Plan of Care Patient        Problem List Patient Active Problem List   Diagnosis Date Noted  . Intractable pain 04/12/2015    Trotter,Margaret PT, DPT 05/26/2015, 2:38 PM  [facility] MAIN [facility] SERVICES [address] [place], [place], [address] Phone: [phone]   Fax:  [phone]  Name: Taegan Haider MRN: [id number] Date  of Birth: Jan 27, 1966

## 2015-05-26 NOTE — Patient Instructions (Addendum)
Pelvic Tilt  Lying on back with knees bent, Flatten back by tightening stomach muscles and rocking hips back Hold for 5 sec, Repeat __10__ times per set. Do __1__ sets per session. Do __2__ sessions per day.  http://orth.exer.us/134    Copyright  VHI. All rights reserved. Knee to Chest (Flexion)   Start with feet on bed, knees bent, Take a deep breath expanding your stomach, then as your exhale, tighten stomach muscles and bring knee towards chest, then breathe in and lower leg towards bed.  Repeat 10 times each leg;    Heel Slides    Start with knees bent and feet on bed; Breathe in deeply expanding stomach, while you are sliding  Your foot out away from body. Then exhale tightening stomach muscles and sliding foot back, bending knees;  Repeat 10 times with each leg;    Copyright  VHI. All rights reserved.

## 2015-05-27 ENCOUNTER — Ambulatory Visit: Payer: 59 | Admitting: Physical Therapy

## 2015-06-02 ENCOUNTER — Ambulatory Visit: Payer: 59 | Attending: Specialist | Admitting: Physical Therapy

## 2015-06-02 ENCOUNTER — Encounter: Payer: Self-pay | Admitting: Physical Therapy

## 2015-06-02 DIAGNOSIS — R531 Weakness: Secondary | ICD-10-CM | POA: Insufficient documentation

## 2015-06-02 DIAGNOSIS — M545 Low back pain, unspecified: Secondary | ICD-10-CM

## 2015-06-02 DIAGNOSIS — R262 Difficulty in walking, not elsewhere classified: Secondary | ICD-10-CM | POA: Diagnosis not present

## 2015-06-02 NOTE — Therapy (Signed)
[facility] [address] [place], [place], [address] Phone: [phone]   Fax:  [phone]  Physical Therapy Treatment  Patient Details  Name: Ashley [name] MRN: [id number] Date of Birth: [birth year] Referring Provider: Deeann SaintHoward Miller MD  Encounter Date: 06/02/2015      PT End of Session - 06/02/15 1536    Visit Number 3   Number of Visits 13   Date for PT Re-Evaluation 06/29/15   PT Start Time 1530   PT Stop Time 1600   PT Time Calculation (min) 30 min   Activity Tolerance Patient tolerated treatment well   Behavior During Therapy [facility] for tasks assessed/performed      Past Medical History  Diagnosis Date  . Allergy   . Anemia   . Asthma   . Blood transfusion without reported diagnosis   . GERD (gastroesophageal reflux disease)   . Sleep apnea   . Diabetes mellitus without complication (HCC)     diet controlled  . Arthritis     back, left elbow, left hip, left ankle     Past Surgical History  Procedure Laterality Date  . Cesarean section    . Cholecystectomy    . Hernia repair    . Tubal ligation    . Fracture surgery      left ankle complete repair, left elbow    There were no vitals filed for this visit.  Visit Diagnosis:  Bilateral low back pain without sciatica  Weakness  Difficulty walking      Subjective Assessment - 06/02/15 1534    Subjective Patient was late to PT appointment; She reports that her exercises are going okay; She feels that her bed might be a little soft. She reports that her legs do get a little tired which could be related to how much she is working;    Pertinent History personal factors affecting rehab: Having to sit for prolonged periods at work, severity of pain;    How long can you sit comfortably? 30 min- 1 hour with increased pain;    How long can you stand comfortably? 2+ hours   How long can you walk comfortably? before pain started she would walk for  30 min or more 3 days out of the week; currently not walking long distances;   Diagnostic tests MRI- shows arthritis, no acute changes   Patient Stated Goals relieve back pain, strengthening,    Currently in Pain? No/denies   Pain Onset More than a month ago      TREATMENT:  Warm up on Treadmill 1.5 mph with 2 HHA x4 min (2 min unbilled); PT educated patient in safe HR range to stay in and to increase erect posture, increase step length and foot clearance;   Patient hooklying with pball: Lumbar trunk rotation x1 min; Double knee to chest pball stretch 10 sec hold x3 reps;  Without pball: Posterior pelvic tilt with diaphragmatic breathing with BLE lift 90/90 5 sec hold x5;  Qped: Cat/camel stretch 5 sec hold x5 each; Child's pose 15 sec hold x3; Patient required mod VCs for correct positioning with stretches for better flexibility;  Sitting on pball: Gentle bounce x1 min x2 reps; Anterior/posterior pelvic rocks x10 reps; Side/side rocks x10 reps;    Patient required min-moderate verbal/tactile cues for correct exercise technique and to increase core abdominal stabilization with UE/LE movement. She required mod VCs to increase breath control with lumbar posterior/anterior pelvic rocks;  PT Education - 06/02/15 1535    Education provided Yes   Education Details core stabilization;    Person(s) Educated Patient   Methods Explanation;Verbal cues   Comprehension Verbalized understanding;Returned demonstration;Verbal cues required             PT Long Term Goals - 05/19/15 1258    PT LONG TERM GOAL #1   Title Patient will be independent in home exercise program to improve strength/mobility for better functional independence with ADLs by 06/29/15   Time 6   Period Weeks   Status New   PT LONG TERM GOAL #2   Title Patient will report a worst pain of 3/10 on VAS in    low back         to improve tolerance with ADLs and reduced  symptoms with activities. by 06/29/15   Time 6   Period Weeks   Status New   PT LONG TERM GOAL #3   Title Patient will reduce modified Oswestry score to <20 as to demonstrate minimal disability with ADLs including improved sleeping tolerance, walking/sitting tolerance etc for better mobility with ADLs. by 06/29/15   Time 6   Period Weeks   Status New   PT LONG TERM GOAL #4   Title Patient will increase lumbar extension strength to at least 4/5 as to improve gross strength for sitting/standing tolerance with better erect posture for increased tolerance with ADLs.    Time 6   Period Weeks   Status New   PT LONG TERM GOAL #5   Title Patient (< 22 years old) will complete five times sit to stand test in < 10 seconds indicating an increased LE strength and improved balance. by 06/29/15   Time 6   Period Weeks   Status New               Plan - 06/02/15 1600    Clinical Impression Statement Instructed patient in advanced core stabilization/lumbar flexion stretches exercise. She was able to perform advanced exercise without an increase in low back pain; Patient did have difficulty with pelvic rock techniques with increased repetition requiring increased cues for better breath control. She would benefit from additional skilled PT intervention to improve lumbar ROM and reduce back pain;    Pt will benefit from skilled therapeutic intervention in order to improve on the following deficits Decreased endurance;Obesity;Hypomobility;Decreased activity tolerance;Decreased strength;Pain;Difficulty walking;Decreased mobility;Decreased balance;Decreased range of motion;Improper body mechanics;Postural dysfunction;Decreased safety awareness   Rehab Potential Fair   Clinical Impairments Affecting Rehab Potential positive: motivated, young in age, new onset of pain; Negative: severity of pain, obesity; Patient's clinical presentation is stable as her pain is centralized to low back and she is independent in  ADLs.    PT Frequency 2x / week   PT Duration 6 weeks   PT Treatment/Interventions Cryotherapy;Electrical Stimulation;Moist Heat;Traction;Balance training;Therapeutic exercise;Therapeutic activities;Functional mobility training;Stair training;Gait training;Ultrasound;Neuromuscular re-education;Patient/family education;Manual techniques;Dry needling   PT Next Visit Plan continue with lumbar flexion ROM;    PT Home Exercise Plan continue as given;    Consulted and Agree with Plan of Care Patient        Problem List Patient Active Problem List   Diagnosis Date Noted  . Intractable pain 04/12/2015    Trotter,Margaret PT, DPT 06/02/2015, 4:01 PM  [facility] MAIN [facility] SERVICES [address] [place], [place], [address] Phone: [phone]   Fax:  [phone]  Name: Ashley [name] MRN: [id number] Date of Birth: 1965-06-04

## 2015-06-03 ENCOUNTER — Encounter: Payer: Self-pay | Admitting: Physical Therapy

## 2015-06-03 ENCOUNTER — Ambulatory Visit: Payer: 59 | Admitting: Physical Therapy

## 2015-06-03 DIAGNOSIS — R262 Difficulty in walking, not elsewhere classified: Secondary | ICD-10-CM

## 2015-06-03 DIAGNOSIS — J352 Hypertrophy of adenoids: Secondary | ICD-10-CM | POA: Diagnosis not present

## 2015-06-03 DIAGNOSIS — G4733 Obstructive sleep apnea (adult) (pediatric): Secondary | ICD-10-CM | POA: Diagnosis not present

## 2015-06-03 DIAGNOSIS — R531 Weakness: Secondary | ICD-10-CM

## 2015-06-03 DIAGNOSIS — M545 Low back pain, unspecified: Secondary | ICD-10-CM

## 2015-06-03 DIAGNOSIS — J309 Allergic rhinitis, unspecified: Secondary | ICD-10-CM | POA: Diagnosis not present

## 2015-06-03 DIAGNOSIS — R51 Headache: Secondary | ICD-10-CM | POA: Diagnosis not present

## 2015-06-03 NOTE — Therapy (Signed)
[facility] [address] [place], [place], [address] Phone: [phone]   Fax:  [phone]  Physical Therapy Treatment  Patient Details  Name: Ashley [name] MRN: [id number] Date of Birth: 1966-02-17 Referring Provider: Deeann SaintHoward Miller MD  Encounter Date: 06/03/2015      PT End of Session - 06/03/15 0818    Visit Number 4   Number of Visits 13   Date for PT Re-Evaluation 06/29/15   PT Start Time 0804   PT Stop Time 0845   PT Time Calculation (min) 41 min   Activity Tolerance Patient tolerated treatment well;No increased pain   Behavior During Therapy [facility] for tasks assessed/performed      Past Medical History  Diagnosis Date  . Allergy   . Anemia   . Asthma   . Blood transfusion without reported diagnosis   . GERD (gastroesophageal reflux disease)   . Sleep apnea   . Diabetes mellitus without complication (HCC)     diet controlled  . Arthritis     back, left elbow, left hip, left ankle     Past Surgical History  Procedure Laterality Date  . Cesarean section    . Cholecystectomy    . Hernia repair    . Tubal ligation    . Fracture surgery      left ankle complete repair, left elbow    There were no vitals filed for this visit.  Visit Diagnosis:  Bilateral low back pain without sciatica  Weakness  Difficulty walking      Subjective Assessment - 06/03/15 0808    Subjective Patient reports being able to walk around the block last night without an increase in pain; she reports having a little tightness last night but no pain; Denies any pain this morning;    Pertinent History personal factors affecting rehab: Having to sit for prolonged periods at work, severity of pain;    How long can you sit comfortably? 30 min- 1 hour with increased pain;    How long can you stand comfortably? 2+ hours   How long can you walk comfortably? before pain started she would walk for 30 min or more 3 days out  of the week; currently not walking long distances;   Diagnostic tests MRI- shows arthritis, no acute changes   Patient Stated Goals relieve back pain, strengthening,    Currently in Pain? No/denies   Pain Onset More than a month ago        TREATMENT:  Qped: Childs pose stretch 15 sec hold x3; Childs pose stretch to right with left QL stretch 15 sec hold x2; Cat/camel stretch 5 sec hold x5 with min VCs to increase lower abdominal flexion for better lumbar stretch; Patient required min VCs for correct positioning with stretch for better tissue extensibliity;   Sitting on pball: Gentle bounce x1 min x2 reps; Anterior/posterior pelvic rocks x10 reps; Side/side rocks x10 reps; Patient required mod VCs for correct positioning with pelvic rocks.She was able to initiate movement well but then started to loose technique with increased repetition;  Sitting on pball with core stabilization: Alternate march x10 bilaterally Alternate LAQ x10 bilaterally;  Patient required min-moderate verbal/tactile cues for correct exercise technique and to increase core abdominal stabilization with UE/LE movement. She required mod VCs to increase breath control with lumbar posterior/anterior pelvic rocks;   Standing: Anterior/posterior pelvic rocks x15 with mod VCs for correct technique working on reducing forward shoulder but rather increase lower  abdominal contraction for lumbar stretch into flexion;  PT provided written handout for HEP, see patient instructions;                          PT Education - 06/03/15 0817    Education provided Yes   Education Details core stabilization, posture   Person(s) Educated Patient   Methods Explanation;Verbal cues   Comprehension Verbalized understanding;Returned demonstration;Verbal cues required             PT Long Term Goals - 05/19/15 1258    PT LONG TERM GOAL #1   Title Patient will be independent in home exercise program to  improve strength/mobility for better functional independence with ADLs by 06/29/15   Time 6   Period Weeks   Status New   PT LONG TERM GOAL #2   Title Patient will report a worst pain of 3/10 on VAS in    low back         to improve tolerance with ADLs and reduced symptoms with activities. by 06/29/15   Time 6   Period Weeks   Status New   PT LONG TERM GOAL #3   Title Patient will reduce modified Oswestry score to <20 as to demonstrate minimal disability with ADLs including improved sleeping tolerance, walking/sitting tolerance etc for better mobility with ADLs. by 06/29/15   Time 6   Period Weeks   Status New   PT LONG TERM GOAL #4   Title Patient will increase lumbar extension strength to at least 4/5 as to improve gross strength for sitting/standing tolerance with better erect posture for increased tolerance with ADLs.    Time 6   Period Weeks   Status New   PT LONG TERM GOAL #5   Title Patient (< 63 years old) will complete five times sit to stand test in < 10 seconds indicating an increased LE strength and improved balance. by 06/29/15   Time 6   Period Weeks   Status New               Plan - 06/03/15 0818    Clinical Impression Statement Instructed patient in core stabilization exercise working in sitting position; Patient does require min VCs for correct exercise technique and positioning; She did have increased difficulty with increased repetition requiring min Vcs to correct core stabilization after about 6 reps; Patient would benefit from additional skilled PT to reduce back pain and improve mobility;    Pt will benefit from skilled therapeutic intervention in order to improve on the following deficits Decreased endurance;Obesity;Hypomobility;Decreased activity tolerance;Decreased strength;Pain;Difficulty walking;Decreased mobility;Decreased balance;Decreased range of motion;Improper body mechanics;Postural dysfunction;Decreased safety awareness   Rehab Potential Fair    Clinical Impairments Affecting Rehab Potential positive: motivated, young in age, new onset of pain; Negative: severity of pain, obesity; Patient's clinical presentation is stable as her pain is centralized to low back and she is independent in ADLs.    PT Frequency 2x / week   PT Duration 6 weeks   PT Treatment/Interventions Cryotherapy;Electrical Stimulation;Moist Heat;Traction;Balance training;Therapeutic exercise;Therapeutic activities;Functional mobility training;Stair training;Gait training;Ultrasound;Neuromuscular re-education;Patient/family education;Manual techniques;Dry needling   PT Next Visit Plan continue with lumbar flexion ROM;    PT Home Exercise Plan Advanced- see patient instructions;    Consulted and Agree with Plan of Care Patient        Problem List Patient Active Problem List   Diagnosis Date Noted  . Intractable pain 04/12/2015    Kaytlynn Kochan PT, DPT  06/03/2015, 8:38 AM  [facility] MAIN [facility] SERVICES [address] [place], [place], [address] Phone: [phone]   Fax:  [phone]  Name: Ashley [name] MRN: [id number] Date of Birth: [birth year]

## 2015-06-03 NOTE — Patient Instructions (Addendum)
       Spinal Mobility (Cat / Camel): Flexion / Extension    Get ON TARGET. Hands and knees (on bed or floor) 1.Cat: Buttocks up, arch spine segmentally, bottom to top: lift chest as head moves back, look up. 2.Camel: Reverse movement. Close eyes, lower head, tuck chin, compress chest and abdomen, round back. Hold _5__ seconds. Repeat _5__ times. Do _2__ sessions per day.  Copyright  VHI. All rights reserved.  Child Pose    Sitting on knees, fold body over legs and relax head and arms on floor. Hold for _15 sec, repeat 2-3 times; You could also do this with feet and hands to right side and then sit back on heels for stretch on left low back; hold for 15 sec, repeat 2-3 times;  http://yg.exer.us/127   Copyright  VHI. All rights reserved.  Crunch: Sitting (Cable)    Sitting on ball: Work on doing forward/backward pelvic tilt; Breathe in and arch back sitting on bottom of legs; Then blow air out and tighten abdominal muscles sitting on tailbone;  Repeat 10 times;  http://sb.exer.us/9   Copyright  VHI. All rights reserved.  Marching    March in place for _10 reps, working on keep stomach muscles tight while lifting each leg; Do _1-2__ times per day.  Copyright  VHI. All rights reserved.  One-Leg Balance    Sitting on ball, tighten stomach muscles: Alternate kicking one leg out in front and then to other being sure to keep stomach muscles tight; Repeat 10 times on each side;  Copyright  VHI. All rights reserved.  Pelvic Anterior / Posterior Tilt / Pelvic Rock    Stand with upper back and buttocks touching wall, knees relaxed. A.Anterior tilt: Raise chest and arch back slightly. B.Posterior tilt: Contract abdominals and flatten back. Rock _10___ times per session. Do _5___ sessions per week. Progression: Perform pelvic tilt away from wall.  Copyright  VHI. All rights reserved.

## 2015-06-08 ENCOUNTER — Ambulatory Visit: Payer: Self-pay | Admitting: Pharmacist

## 2015-06-08 ENCOUNTER — Encounter: Payer: Self-pay | Admitting: Pharmacist

## 2015-06-09 DIAGNOSIS — G4733 Obstructive sleep apnea (adult) (pediatric): Secondary | ICD-10-CM | POA: Diagnosis not present

## 2015-06-16 ENCOUNTER — Ambulatory Visit: Payer: 59 | Admitting: Physical Therapy

## 2015-06-16 DIAGNOSIS — K219 Gastro-esophageal reflux disease without esophagitis: Secondary | ICD-10-CM | POA: Diagnosis not present

## 2015-06-16 DIAGNOSIS — Z9884 Bariatric surgery status: Secondary | ICD-10-CM | POA: Diagnosis not present

## 2015-06-19 ENCOUNTER — Ambulatory Visit: Payer: 59 | Admitting: Physical Therapy

## 2015-06-22 ENCOUNTER — Ambulatory Visit: Payer: 59 | Admitting: Physical Therapy

## 2015-06-22 ENCOUNTER — Encounter: Payer: Self-pay | Admitting: Physical Therapy

## 2015-06-22 DIAGNOSIS — R262 Difficulty in walking, not elsewhere classified: Secondary | ICD-10-CM

## 2015-06-22 DIAGNOSIS — R531 Weakness: Secondary | ICD-10-CM | POA: Diagnosis not present

## 2015-06-22 DIAGNOSIS — M545 Low back pain, unspecified: Secondary | ICD-10-CM

## 2015-06-22 NOTE — Patient Instructions (Addendum)
  Copyright  VHI. All rights reserved.  Shoulder Retraction   Tie band around door knob (sitting or standing, holding band in both hands) Facing chest height anchor, grasp ends of band and pull hands to chest, squeezing shoulder blades together. Hold _3-5seconds. Repeat _10 times. Do _2_ sessions per day. Safety Note: Be sure anchor is secure.  Copyright  VHI. All rights reserved.  Strengthening: Resisted Extension   Hold band in both hands, Pull arm back, elbow straight, squeezing shoulder blades, Repeat _10___ times per set. Do _2___ sets per session. Do __1__ sessions per day.  http://orth.exer.us/833   Copyright  VHI. All rights reserved.   Copyright  VHI. All rights reserved.  Reverse Fly / Shoulder Retraction  Holding band in both hands, Extend both arms in front of body at shoulder height, palms down, holding band. Move arms out to sides, squeeze shoulder blades together. Repeat _10__ times. Do _2__ sessions per day.   Copyright  VHI. All rights reserved.   Roll   Inhale and bring shoulders up, back, then exhale and relax shoulders down. Repeat _10__ times. Do _2-3__ times per day.  Copyright  VHI. All rights reserved.

## 2015-06-22 NOTE — Therapy (Signed)
[place] MAIN [facility] SERVICES [address] [place], [place], [address] Phone: [phone]   Fax:  [phone]  Physical Therapy Treatment  Patient Details  Name: Ashley [name] MRN: [id number] Date of Birth: 21-Nov-1965 Referring Provider: Earnestine Leys MD  Encounter Date: 06/22/2015      PT End of Session - 06/22/15 0916    Visit Number 5   Number of Visits 13   Date for PT Re-Evaluation 06/29/15   PT Start Time 5027   PT Stop Time 0936   PT Time Calculation (min) 38 min   Activity Tolerance Patient tolerated treatment well;No increased pain   Behavior During Therapy [facility] for tasks assessed/performed      Past Medical History  Diagnosis Date  . Allergy   . Anemia   . Asthma   . Blood transfusion without reported diagnosis   . GERD (gastroesophageal reflux disease)   . Sleep apnea   . Diabetes mellitus without complication (HCC)     diet controlled  . Arthritis     back, left elbow, left hip, left ankle     Past Surgical History  Procedure Laterality Date  . Cesarean section    . Cholecystectomy    . Hernia repair    . Tubal ligation    . Fracture surgery      left ankle complete repair, left elbow    There were no vitals filed for this visit.  Visit Diagnosis:  Bilateral low back pain without sciatica  Weakness  Difficulty walking      Subjective Assessment - 06/22/15 0902    Subjective Patient reports being sick last week. She reports having increased left hip discomfort on Friday but doesn't believe it was related to back pain; She reports compliance with HEP;    Pertinent History personal factors affecting rehab: Having to sit for prolonged periods at work, severity of pain;    How long can you sit comfortably? 30 min- 1 hour with increased pain;    How long can you stand comfortably? 2+ hours   How long can you walk comfortably? before pain started she would walk for 30 min or more 3 days out of  the week; currently not walking long distances;   Diagnostic tests MRI- shows arthritis, no acute changes   Patient Stated Goals relieve back pain, strengthening,    Currently in Pain? No/denies   Pain Onset More than a month ago            [facility] PT Assessment - 06/22/15 0001    Observation/Other Assessments   Modified Oswertry 20% (min-mod disability, improved from initial eval on 05/18/15 which was 48%)   Standardized Balance Assessment   Five times sit to stand comments  17.5 sec without HHA >10 sec indicates increased risk for falls; improved from iniital eval on 05/18/15 which was 22 sec;        TREATMENT: Warm up on treadmill 2.0 mph with 2 HHA x3 min (unbilled);  Qped prayer stretch 15 sec hold x3; Qped cat/camel stretch 5 sec hold x5 each direction, with min VCs to increase pelvic rotation;   Sitting on pball: Gentle bounce x1 min x2 reps; Anterior/posterior pelvic rocks x10 reps; Side/side rocks x10 reps; Patient required min-moderate verbal/tactile cues for correct exercise technique and to increase core abdominal stabilization with UE/LE movement. She required mod VCs to increase breath control with lumbar posterior/anterior pelvic rocks; She was able to demonstrate improved ability with side/side rocks  as compared to forward/backward rocks;   Instructed patient in Modified oswestry, 5 times sit<>Stand etc to assess progress towards goals. Please see above;  Advanced HEP with postural strengthening: Green tband in BUE: Shoulder extension x10  Shoulder rows x10; With min VCS to increase scapular retraction which increasing core abdominal stabilization; BUE green tband horizontal abduction x10 with mod VCs to avoid elbow flexion and increase scapular retraction;                        PT Education - 06/22/15 0916    Education provided Yes   Education Details core strengthening, progress towards goals;    Person(s) Educated Patient   Methods  Explanation;Verbal cues   Comprehension Verbalized understanding;Returned demonstration;Verbal cues required             PT Long Term Goals - 06/22/15 0903    PT LONG TERM GOAL #1   Title Patient will be independent in home exercise program to improve strength/mobility for better functional independence with ADLs by 06/29/15   Time 6   Period Weeks   Status On-going   PT LONG TERM GOAL #2   Title Patient will report a worst pain of 3/10 on VAS in    low back         to improve tolerance with ADLs and reduced symptoms with activities. by 06/29/15   Baseline 0/10   Time 6   Period Weeks   Status Achieved   PT LONG TERM GOAL #3   Title Patient will reduce modified Oswestry score to <20 as to demonstrate minimal disability with ADLs including improved sleeping tolerance, walking/sitting tolerance etc for better mobility with ADLs. by 06/29/15   Time 6   Period Weeks   Status Achieved   PT LONG TERM GOAL #4   Title Patient will increase lumbar extension strength to at least 4/5 as to improve gross strength for sitting/standing tolerance with better erect posture for increased tolerance with ADLs.    Time 6   Period Weeks   Status Partially Met   PT LONG TERM GOAL #5   Title Patient (< 48 years old) will complete five times sit to stand test in < 10 seconds indicating an increased LE strength and improved balance. by 06/29/15   Time 6   Period Weeks   Status Partially Met               Plan - 06/22/15 0939    Clinical Impression Statement Patient seems to be doing better. She denies any back pain over last few days. She is compliant with HEP; She does avoid lifting heavy objects and reports having to stretch daily to help with flexibility. Patient demonstrates improved outcome measures with lower modified oswestry score and improved sit<>Stand test. She has met some goals and made progress towards all goals. Patient instructed in core stabilization and upper back strengthening  today. She continues to require min VCS for correct exercise technique. She would benefit from additional skilled PT Intervention to improve LE strength, balance/gait safety and reduce fall risk; Plan to advance HEP with increased low back strengthening and lifting techniques next visit;    Pt will benefit from skilled therapeutic intervention in order to improve on the following deficits Decreased endurance;Obesity;Hypomobility;Decreased activity tolerance;Decreased strength;Pain;Difficulty walking;Decreased mobility;Decreased balance;Decreased range of motion;Improper body mechanics;Postural dysfunction;Decreased safety awareness   Rehab Potential Fair   Clinical Impairments Affecting Rehab Potential positive: motivated, young in age, new onset of  pain; Negative: severity of pain, obesity; Patient's clinical presentation is stable as her pain is centralized to low back and she is independent in ADLs.    PT Frequency 2x / week   PT Duration 6 weeks   PT Treatment/Interventions Cryotherapy;Electrical Stimulation;Moist Heat;Traction;Balance training;Therapeutic exercise;Therapeutic activities;Functional mobility training;Stair training;Gait training;Ultrasound;Neuromuscular re-education;Patient/family education;Manual techniques;Dry needling   PT Next Visit Plan work on lumbar extension strengthening and lifting techniques;    PT Home Exercise Plan advanced- see patient instructions;    Consulted and Agree with Plan of Care Patient        Problem List Patient Active Problem List   Diagnosis Date Noted  . Intractable pain 04/12/2015    Liel Rudden PT, DPT 06/22/2015, 9:42 AM  [place] MAIN [facility] SERVICES [address] [place], [place], [address] Phone: [phone]   Fax:  [phone]  Name: Amilliana Hayworth MRN: [id number] Date of Birth: Jun 14, 1965

## 2015-06-26 ENCOUNTER — Ambulatory Visit: Payer: 59 | Admitting: Physical Therapy

## 2015-06-26 ENCOUNTER — Encounter: Payer: Self-pay | Admitting: Physical Therapy

## 2015-06-26 ENCOUNTER — Encounter: Payer: Self-pay | Admitting: Pharmacist

## 2015-06-26 DIAGNOSIS — R531 Weakness: Secondary | ICD-10-CM | POA: Diagnosis not present

## 2015-06-26 DIAGNOSIS — M545 Low back pain, unspecified: Secondary | ICD-10-CM

## 2015-06-26 DIAGNOSIS — R262 Difficulty in walking, not elsewhere classified: Secondary | ICD-10-CM | POA: Diagnosis not present

## 2015-06-26 NOTE — Therapy (Signed)
[place] MAIN [facility] SERVICES [address] [place], [place], [address] Phone: [phone]   Fax:  [phone]  Physical Therapy Treatment/Discharge Summary  Patient Details  Name: Ashley [name] MRN: [id number] Date of Birth: 02/15/1966 Referring Provider: Earnestine Leys MD  Encounter Date: 06/26/2015      PT End of Session - 06/26/15 0941    Visit Number 6   Number of Visits 13   Date for PT Re-Evaluation 06/29/15   PT Start Time 0850   PT Stop Time 0930   PT Time Calculation (min) 40 min   Activity Tolerance Patient tolerated treatment well;No increased pain   Behavior During Therapy [facility] for tasks assessed/performed      Past Medical History  Diagnosis Date  . Allergy   . Anemia   . Asthma   . Blood transfusion without reported diagnosis   . GERD (gastroesophageal reflux disease)   . Sleep apnea   . Diabetes mellitus without complication (HCC)     diet controlled  . Arthritis     back, left elbow, left hip, left ankle     Past Surgical History  Procedure Laterality Date  . Cesarean section    . Cholecystectomy    . Hernia repair    . Tubal ligation    . Fracture surgery      left ankle complete repair, left elbow    There were no vitals filed for this visit.  Visit Diagnosis:  Bilateral low back pain without sciatica  Weakness  Difficulty walking      Subjective Assessment - 06/26/15 0856    Subjective Patient reports doing well today; She denies any pain; She does have stiffness in hip; Patient reports compliance with HEP;    Pertinent History personal factors affecting rehab: Having to sit for prolonged periods at work, severity of pain;    How long can you sit comfortably? 30 min- 1 hour with increased pain;    How long can you stand comfortably? 2+ hours   How long can you walk comfortably? before pain started she would walk for 30 min or more 3 days out of the week; currently not walking long  distances;   Diagnostic tests MRI- shows arthritis, no acute changes   Patient Stated Goals relieve back pain, strengthening,    Currently in Pain? No/denies   Pain Onset More than a month ago          TREATMENT: Prior to therapeutic activity:  Warm up on Treadmill 2.0 mph with 2 HHA x3 min (unbilled);  Hooklying with pball: Lumbar trunk rotation x1 min; Double knee to chest stretch 5 sec hold x5; Bridges without ball x10 with cues for core abdominal stabilization and to avoid painful ROM;  Prone: Press up on elbows x10 reps; Alternate knee flexion x10 bilaterally; Alternate hip extension x10 bilaterally with mod VCS to avoid trunk rotation; Alternate UE lift x10 bilaterally; Alternate UE/LE combo x5 each Patient exhibits decreased multifidi tone on right side requiring cues to increase core stabilization and avoid trunk rotation;  Qped: Childs pose 15 sec hold x2;  Advanced HEP- see attached;  Following exercise: PT instructed patient in correct lifting techniques, including floor to waist, waist to shoulder etc. Educated patient to avoid stooping, avoid twisting and avoid holding object away from body for better techniques; Patient educated to increase base of support prior to lifting from floor for less knee discomfort; Also educated patient in golfer's pick up and use  of reacher for less difficulty. Provided handouts with pictures of correct lifting technique;  Patient is appropriate for DC from therapy as she has met all goals and has no pain with ADLs or basic activities.                        PT Education - 06/26/15 0904    Education provided Yes   Education Details back strengthening, HEP advanced, lifting techniques;    Person(s) Educated Patient   Methods Explanation;Verbal cues;Handout   Comprehension Verbalized understanding;Returned demonstration;Verbal cues required;Tactile cues required             PT Long Term Goals - 06/26/15  0942    PT LONG TERM GOAL #1   Title Patient will be independent in home exercise program to improve strength/mobility for better functional independence with ADLs by 06/29/15   Time 6   Period Weeks   Status Achieved   PT LONG TERM GOAL #2   Title Patient will report a worst pain of 3/10 on VAS in    low back         to improve tolerance with ADLs and reduced symptoms with activities. by 06/29/15   Baseline 0/10   Time 6   Period Weeks   Status Achieved   PT LONG TERM GOAL #3   Title Patient will reduce modified Oswestry score to <20 as to demonstrate minimal disability with ADLs including improved sleeping tolerance, walking/sitting tolerance etc for better mobility with ADLs. by 06/29/15   Time 6   Period Weeks   Status Achieved   PT LONG TERM GOAL #4   Title Patient will increase lumbar extension strength to at least 4/5 as to improve gross strength for sitting/standing tolerance with better erect posture for increased tolerance with ADLs.    Time 6   Period Weeks   Status Achieved   PT LONG TERM GOAL #5   Title Patient (< 52 years old) will complete five times sit to stand test in < 10 seconds indicating an increased LE strength and improved balance. by 06/29/15   Time 6   Period Weeks   Status Partially Met               Plan - 06/26/15 0941    Clinical Impression Statement Instructed patient in lumbar extension strengthening. Patient required min VCs to avoid painful ROM and to avoid trunk rotation for better strengthening. She was able to progress exericses well without increase in pain. Patient also educated in correct lifting techniques. she has met all goals and is appropriate for discharge from PT at this time.    Pt will benefit from skilled therapeutic intervention in order to improve on the following deficits Decreased endurance;Obesity;Hypomobility;Decreased activity tolerance;Decreased strength;Pain;Difficulty walking;Decreased mobility;Decreased balance;Decreased  range of motion;Improper body mechanics;Postural dysfunction;Decreased safety awareness   Rehab Potential Fair   Clinical Impairments Affecting Rehab Potential positive: motivated, young in age, new onset of pain; Negative: severity of pain, obesity; Patient's clinical presentation is stable as her pain is centralized to low back and she is independent in ADLs.    PT Frequency 2x / week   PT Duration 6 weeks   PT Treatment/Interventions Cryotherapy;Electrical Stimulation;Moist Heat;Traction;Balance training;Therapeutic exercise;Therapeutic activities;Functional mobility training;Stair training;Gait training;Ultrasound;Neuromuscular re-education;Patient/family education;Manual techniques;Dry needling   PT Next Visit Plan discharge from PT   PT Home Exercise Plan advanced- see patient instructions;    Consulted and Agree with Plan of Care Patient  Problem List Patient Active Problem List   Diagnosis Date Noted  . Intractable pain 04/12/2015    Montrae Braithwaite PT, DPT 06/26/2015, 9:43 AM  [place] MAIN [facility] SERVICES [address] [place], [place], [address] Phone: [phone]   Fax:  [phone]  Name: Ashley [name] MRN: [id number] Date of Birth: January 06, 1966

## 2015-06-26 NOTE — Patient Instructions (Addendum)
  Bridge    Lie back, legs bent. Inhale, pressing hips up. Keeping ribs in, lengthen lower back. Exhale, rolling down along spine from top. Repeat __10__ times. Do __2__ sessions per day.  http://pm.exer.us/55   Copyright  VHI. All rights reserved.  Extension Lift    Keeping knee locked, tighten buttock muscles to raise sound limb _in pain free range of motion; Hold 2____ seconds. Repeat _10___ times. Do __2__ sessions per day.  Copyright  VHI. All rights reserved.  Scapular Retraction: Flexion (Prone)    Lie with arms forward. Alternate UE lift, Repeat _10___ times per set. Do __2__ sets per session. Do __2_ sessions per day.  http://orth.exer.us/961   Copyright  VHI. All rights reserved.  Trunk Extensors    Lie on firm surface or kneel over ball. Lift one arm and opposite leg, keeping elbow and knee straight. Then reverse. Hold __2__ seconds. Repeat _10___ times. Do __2__ sessions per day. CAUTION: Use slow, controlled movement.  Copyright  VHI. All rights reserved.  Shoulder Push-Up (Prone on Elbows)    With elbows placed under shoulders, rise up on elbows as high as possible. Keep hips on surface and back arched. Hold 2-3____ seconds. Repeat _10___ times. Do __2__ sessions per day.  Copyright  VHI. All rights reserved.

## 2015-07-04 DIAGNOSIS — G4733 Obstructive sleep apnea (adult) (pediatric): Secondary | ICD-10-CM | POA: Diagnosis not present

## 2015-07-07 DIAGNOSIS — K219 Gastro-esophageal reflux disease without esophagitis: Secondary | ICD-10-CM | POA: Diagnosis not present

## 2015-07-07 DIAGNOSIS — Z9884 Bariatric surgery status: Secondary | ICD-10-CM | POA: Diagnosis not present

## 2015-07-07 DIAGNOSIS — G4733 Obstructive sleep apnea (adult) (pediatric): Secondary | ICD-10-CM | POA: Diagnosis not present

## 2015-07-29 DIAGNOSIS — R51 Headache: Secondary | ICD-10-CM | POA: Diagnosis not present

## 2015-07-29 DIAGNOSIS — J352 Hypertrophy of adenoids: Secondary | ICD-10-CM | POA: Diagnosis not present

## 2015-08-03 DIAGNOSIS — G4733 Obstructive sleep apnea (adult) (pediatric): Secondary | ICD-10-CM | POA: Diagnosis not present

## 2015-08-11 ENCOUNTER — Other Ambulatory Visit: Payer: Self-pay | Admitting: Obstetrics and Gynecology

## 2015-08-11 DIAGNOSIS — Z1211 Encounter for screening for malignant neoplasm of colon: Secondary | ICD-10-CM | POA: Diagnosis not present

## 2015-08-11 DIAGNOSIS — Z1151 Encounter for screening for human papillomavirus (HPV): Secondary | ICD-10-CM | POA: Diagnosis not present

## 2015-08-11 DIAGNOSIS — Z01419 Encounter for gynecological examination (general) (routine) without abnormal findings: Secondary | ICD-10-CM | POA: Diagnosis not present

## 2015-08-11 DIAGNOSIS — Z124 Encounter for screening for malignant neoplasm of cervix: Secondary | ICD-10-CM | POA: Diagnosis not present

## 2015-09-01 DIAGNOSIS — R42 Dizziness and giddiness: Secondary | ICD-10-CM | POA: Diagnosis not present

## 2015-09-01 DIAGNOSIS — E119 Type 2 diabetes mellitus without complications: Secondary | ICD-10-CM | POA: Diagnosis not present

## 2015-09-01 DIAGNOSIS — E213 Hyperparathyroidism, unspecified: Secondary | ICD-10-CM | POA: Diagnosis not present

## 2015-09-01 DIAGNOSIS — G4733 Obstructive sleep apnea (adult) (pediatric): Secondary | ICD-10-CM | POA: Diagnosis not present

## 2015-09-01 DIAGNOSIS — D6489 Other specified anemias: Secondary | ICD-10-CM | POA: Diagnosis not present

## 2015-09-07 DIAGNOSIS — R002 Palpitations: Secondary | ICD-10-CM | POA: Diagnosis not present

## 2015-11-05 DIAGNOSIS — J352 Hypertrophy of adenoids: Secondary | ICD-10-CM | POA: Diagnosis not present

## 2015-11-05 DIAGNOSIS — J3503 Chronic tonsillitis and adenoiditis: Secondary | ICD-10-CM | POA: Diagnosis not present

## 2015-11-26 DIAGNOSIS — J3503 Chronic tonsillitis and adenoiditis: Secondary | ICD-10-CM | POA: Diagnosis not present

## 2015-11-26 DIAGNOSIS — J352 Hypertrophy of adenoids: Secondary | ICD-10-CM | POA: Diagnosis not present

## 2015-12-01 ENCOUNTER — Encounter
Admission: RE | Admit: 2015-12-01 | Discharge: 2015-12-01 | Disposition: A | Discharge status: Home / Self Care | Payer: 59 | Source: Ambulatory Visit | Attending: Unknown Physician Specialty | Admitting: Unknown Physician Specialty

## 2015-12-01 ENCOUNTER — Encounter: Payer: Self-pay | Admitting: *Deleted

## 2015-12-01 DIAGNOSIS — Z01812 Encounter for preprocedural laboratory examination: Secondary | ICD-10-CM | POA: Diagnosis not present

## 2015-12-01 HISTORY — DX: Other specified postprocedural states: Z98.890

## 2015-12-01 HISTORY — DX: Other specified postprocedural states: R11.2

## 2015-12-01 LAB — BASIC METABOLIC PANEL
ANION GAP: 3 — AB (ref 5–15)
BUN: 18 mg/dL (ref 6–20)
CO2: 32 mmol/L (ref 22–32)
Calcium: 10.4 mg/dL — ABNORMAL HIGH (ref 8.9–10.3)
Chloride: 103 mmol/L (ref 101–111)
Creatinine, Ser: 0.66 mg/dL (ref 0.44–1.00)
GFR calc Af Amer: >60 mL/min (ref >60)
Glucose, Bld: 95 mg/dL (ref 65–99)
POTASSIUM: 4 mmol/L (ref 3.5–5.1)
SODIUM: 138 mmol/L (ref 135–145)

## 2015-12-01 NOTE — Patient Instructions (Signed)
Your procedure is scheduled on: Tues 9/12 Report to Day Surgery To find out your arrival time please call [phone] between 1PM - 3PM on Mon 12/07/15.  Remember: Instructions that are not followed completely may result in serious medical risk, up to and including death, or upon the discretion of your surgeon and anesthesiologist your surgery may need to be rescheduled.    __x__ 1. Do not eat food or drink liquids after midnight. No gum chewing or hard candies.     __x__ 2. No Alcohol for 24 hours before or after surgery.   ____ 3. Do Not Smoke For 24 Hours Prior to Your Surgery.   ____ 4. Bring all medications with you on the day of surgery if instructed.    ___x_ 5. Notify your doctor if there is any change in your medical condition     (cold, fever, infections).       Do not wear jewelry, make-up, hairpins, clips or nail polish.  Do not wear lotions, powders, or perfumes. You may wear deodorant.  Do not shave 48 hours prior to surgery. Men may shave face and neck.  Do not bring valuables to the hospital.    [facility] is not [facility] for any belongings or valuables.               Contacts, dentures or bridgework may not be worn into surgery.  Leave your suitcase in the car. After surgery it may be brought to your room.  For patients admitted to the hospital, discharge time is determined by your                treatment team.   Patients discharged the day of surgery will not be allowed to drive home.   Please read over the following fact sheets that you were given:   Surgical Site Infection Prevention   ____ Take these medicines the morning of surgery with A SIP OF WATER:    1. Dexlansoprazole (DEXILANT PO)  2. fluticasone (FLONASE) 50 MCG/ACT nasal spray  3. traMADol (ULTRAM) 50 MG tablet if needed  4.  5.  6.  ____ Fleet Enema (as directed)   ____ Use CHG Soap as directed  __x__ Use inhalers on the day of surgery Proventil and bring both the symbicort and  proventil inhalers  ____ Stop metformin 2 days prior to surgery    ____ Take 1/2 of usual insulin dose the night before surgery and none on the morning of surgery.   ____ Stop Coumadin/Plavix/aspirin on  __x__ Stop Anti-inflammatories on tylenol only   ___x_ Stop supplements until after surgery. Vit E   __x__ Bring C-Pap to the hospital.

## 2015-12-08 ENCOUNTER — Encounter: Payer: Self-pay | Admitting: *Deleted

## 2015-12-08 ENCOUNTER — Ambulatory Visit: Payer: 59 | Admitting: Registered Nurse

## 2015-12-08 ENCOUNTER — Ambulatory Visit
Admission: RE | Admit: 2015-12-08 | Discharge: 2015-12-08 | Disposition: A | Discharge status: Home / Self Care | Payer: 59 | Source: Ambulatory Visit | Attending: Unknown Physician Specialty | Admitting: Unknown Physician Specialty

## 2015-12-08 ENCOUNTER — Encounter
Admission: RE | Disposition: A | Discharge status: Home / Self Care | Payer: Self-pay | Source: Ambulatory Visit | Attending: Unknown Physician Specialty

## 2015-12-08 DIAGNOSIS — J353 Hypertrophy of tonsils with hypertrophy of adenoids: Secondary | ICD-10-CM | POA: Diagnosis not present

## 2015-12-08 DIAGNOSIS — J3502 Chronic adenoiditis: Secondary | ICD-10-CM | POA: Diagnosis not present

## 2015-12-08 DIAGNOSIS — J3503 Chronic tonsillitis and adenoiditis: Secondary | ICD-10-CM | POA: Insufficient documentation

## 2015-12-08 DIAGNOSIS — J352 Hypertrophy of adenoids: Secondary | ICD-10-CM | POA: Diagnosis not present

## 2015-12-08 HISTORY — PX: TONSILLECTOMY AND ADENOIDECTOMY: SHX28

## 2015-12-08 LAB — GLUCOSE, CAPILLARY
GLUCOSE-CAPILLARY: 104 mg/dL — AB (ref 65–99)
Glucose-Capillary: 87 mg/dL (ref 65–99)

## 2015-12-08 SURGERY — TONSILLECTOMY AND ADENOIDECTOMY
Anesthesia: General

## 2015-12-08 MED ORDER — PHENYLEPHRINE HCL 10 MG/ML IJ SOLN
INTRAMUSCULAR | Status: DC | PRN
  Administered 2015-12-08: 300 ug via INTRAVENOUS

## 2015-12-08 MED ORDER — SODIUM CHLORIDE 0.9 % IV SOLN
INTRAVENOUS | Status: DC
  Administered 2015-12-08: 08:00:00 via INTRAVENOUS

## 2015-12-08 MED ORDER — FENTANYL CITRATE (PF) 100 MCG/2ML IJ SOLN
INTRAMUSCULAR | Status: DC | PRN
  Administered 2015-12-08: 100 ug via INTRAVENOUS

## 2015-12-08 MED ORDER — HYDROCODONE-ACETAMINOPHEN 7.5-325 MG/15ML PO SOLN: 10.0000 mL | ORAL | Status: DC | PRN

## 2015-12-08 MED ORDER — FENTANYL CITRATE (PF) 100 MCG/2ML IJ SOLN
INTRAMUSCULAR | Status: AC
  Filled 2015-12-08: qty 2

## 2015-12-08 MED ORDER — HYDROCODONE-ACETAMINOPHEN 7.5-325 MG/15ML PO SOLN
ORAL | Status: AC
  Administered 2015-12-08: 15 mL
  Filled 2015-12-08: qty 15

## 2015-12-08 MED ORDER — PROPOFOL 10 MG/ML IV BOLUS
INTRAVENOUS | Status: DC | PRN
Start: 2015-12-08 — End: 2015-12-08
  Administered 2015-12-08: 150 mg via INTRAVENOUS

## 2015-12-08 MED ORDER — SUCCINYLCHOLINE CHLORIDE 20 MG/ML IJ SOLN
INTRAMUSCULAR | Status: DC | PRN
  Administered 2015-12-08: 80 mg via INTRAVENOUS

## 2015-12-08 MED ORDER — DEXAMETHASONE SODIUM PHOSPHATE 10 MG/ML IJ SOLN
INTRAMUSCULAR | Status: DC | PRN
  Administered 2015-12-08: 10 mg via INTRAVENOUS

## 2015-12-08 MED ORDER — SCOPOLAMINE 1 MG/3DAYS TD PT72
MEDICATED_PATCH | TRANSDERMAL | Status: AC
  Administered 2015-12-08: 1.5 mg via TRANSDERMAL
  Filled 2015-12-08: qty 1

## 2015-12-08 MED ORDER — ONDANSETRON HCL 4 MG/2ML IJ SOLN: 4.0000 mg | Freq: Once | INTRAMUSCULAR | Status: DC | PRN

## 2015-12-08 MED ORDER — ONDANSETRON HCL 8 MG PO TABS: 8.0000 mg | ORAL_TABLET | Freq: Three times a day (TID) | ORAL | 0 refills | Status: DC | PRN

## 2015-12-08 MED ORDER — LIDOCAINE VISCOUS 2 % MT SOLN: 20.0000 mL | OROMUCOSAL | 3 refills | Status: DC | PRN

## 2015-12-08 MED ORDER — ONDANSETRON HCL 4 MG/2ML IJ SOLN
INTRAMUSCULAR | Status: DC | PRN
  Administered 2015-12-08: 4 mg via INTRAVENOUS

## 2015-12-08 MED ORDER — BUPIVACAINE HCL 0.5 % IJ SOLN
INTRAMUSCULAR | Status: DC | PRN
  Administered 2015-12-08: 8 mL

## 2015-12-08 MED ORDER — MIDAZOLAM HCL 2 MG/2ML IJ SOLN
INTRAMUSCULAR | Status: DC | PRN
  Administered 2015-12-08: 2 mg via INTRAVENOUS

## 2015-12-08 MED ORDER — LIDOCAINE HCL (CARDIAC) 20 MG/ML IV SOLN
INTRAVENOUS | Status: DC | PRN
  Administered 2015-12-08: 100 mg via INTRAVENOUS

## 2015-12-08 MED ORDER — SCOPOLAMINE 1 MG/3DAYS TD PT72
1.0000 | MEDICATED_PATCH | Freq: Once | TRANSDERMAL | Status: DC
  Administered 2015-12-08: 1.5 mg via TRANSDERMAL

## 2015-12-08 MED ORDER — HYDROCODONE-ACETAMINOPHEN 7.5-325 MG/15ML PO SOLN: 15.0000 mL | ORAL | 0 refills | Status: DC | PRN

## 2015-12-08 MED ORDER — FENTANYL CITRATE (PF) 100 MCG/2ML IJ SOLN
25.0000 ug | INTRAMUSCULAR | Status: DC | PRN
  Administered 2015-12-08 (×2): 25 ug via INTRAVENOUS

## 2015-12-08 SURGICAL SUPPLY — 15 items
CANISTER SUCT 1200ML W/VALVE (MISCELLANEOUS) ×3 IMPLANT
CATH ROBINSON RED A/P 8FR (CATHETERS) ×3 IMPLANT
COAG SUCT 10F 3.5MM HAND CTRL (MISCELLANEOUS) ×3 IMPLANT
ELECT CAUTERY BLADE TIP 2.5 (TIP) ×3
ELECT REM PT RETURN 9FT ADLT (ELECTROSURGICAL) ×3
ELECTRODE CAUTERY BLDE TIP 2.5 (TIP) ×1 IMPLANT
ELECTRODE REM PT RTRN 9FT ADLT (ELECTROSURGICAL) ×1 IMPLANT
GLOVE BIO SURGEON STRL SZ7.5 (GLOVE) ×3 IMPLANT
HANDLE SUCTION POOLE (INSTRUMENTS) ×1 IMPLANT
NS IRRIG 500ML POUR BTL (IV SOLUTION) ×3 IMPLANT
PACK HEAD/NECK (MISCELLANEOUS) ×3 IMPLANT
SOL ANTI-FOG 6CC FOG-OUT (MISCELLANEOUS) ×1 IMPLANT
SOL FOG-OUT ANTI-FOG 6CC (MISCELLANEOUS) ×2
SPONGE TONSIL 1 RF SGL (DISPOSABLE) ×3 IMPLANT
SUCTION POOLE HANDLE (INSTRUMENTS) ×3

## 2015-12-08 NOTE — Discharge Instructions (Signed)
Tonsillectomy, Adult, Care After Refer to this sheet in the next few weeks. These instructions provide you with information on caring for yourself after your procedure. Your health care provider may also give you more specific instructions. Your treatment has been planned according to current medical practices, but problems sometimes occur. Call your health care provider if you have any problems or questions after your procedure. WHAT TO EXPECT AFTER THE PROCEDURE After your procedure, it is typical to have the following:  Your tongue will be numb, and your sense of taste will be reduced.  Your swallowing will be difficult and painful.  Your jaw may hurt or make a clicking noise when you yawn or chew.  Liquids that you drink may leak out of your nose.  Your voice may sound muffled.  The area at the middle of the roof of your mouth (uvula) may be very swollen.  You may have a constant cough and need to clear mucus and phlegm from your throat. HOME CARE INSTRUCTIONS   Get proper rest, keeping your head elevated at all times.  Drink plenty of fluids. This reduces pain and hastens the healing process.  Take medicines only as directed by your health care provider.  Soft and cold foods, such as gelatin, sherbet, ice cream, frozen ice pops, and cold drinks, are usually the easiest to eat. Several days after surgery, you will be able to eat more solid food.  Avoid mouthwashes and gargles.  Avoid contact with people who have upper respiratory infections, such as colds and sore throats. SEEK MEDICAL CARE IF:   You have increasing pain that is not controlled with medicines.  You have a fever.  You have a rash.  You feel light-headed or faint.  You are unable to swallow even small amounts of liquid or saliva.  Your urine is becoming very dark. SEEK IMMEDIATE MEDICAL CARE IF:   You have difficulty breathing.  You experience side effects or allergic reactions to medicines.  You  bleed bright red blood from your throat, or you vomit bright red blood.   This information is not intended to replace advice given to you by your health care provider. Make sure you discuss any questions you have with your health care provider.   Document Released: 01/13/2004 Document Revised: 07/29/2014 Document Reviewed: 10/09/2012 Elsevier Interactive Patient Education 2016 [employer] General Anesthesia, Adult General anesthesia is a sleep-like state of non-feeling produced by medicines (anesthetics). General anesthesia prevents you from being alert and feeling pain during a medical procedure. Your caregiver may recommend general anesthesia if your procedure:  Is long.  Is painful or uncomfortable.  Would be frightening to see or hear.  Requires you to be still.  Affects your breathing.  Causes significant blood loss. LET YOUR CAREGIVER KNOW ABOUT:  Allergies to food or medicine.  Medicines taken, including vitamins, herbs, eyedrops, over-the-counter medicines, and creams.  Use of steroids (by mouth or creams).  Previous problems with anesthetics or numbing medicines, including problems experienced by relatives.  History of bleeding problems or blood clots.  Previous surgeries and types of anesthetics received.  Possibility of pregnancy, if this applies.  Use of cigarettes, alcohol, or illegal drugs.  Any health condition(s), especially diabetes, sleep apnea, and high blood pressure. RISKS AND COMPLICATIONS General anesthesia rarely causes complications. However, if complications do occur, they can be life threatening. Complications include:  A lung infection.  A stroke.  A heart attack.  Waking up during the procedure. When this occurs, the  patient may be unable to move and communicate that he or she is awake. The patient may feel severe pain. Older adults and adults with serious medical problems are more likely to have complications than adults who are young  and healthy. Some complications can be prevented by answering all of your caregiver's questions thoroughly and by following all pre-procedure instructions. It is important to tell your caregiver if any of the pre-procedure instructions, especially those related to diet, were not followed. Any food or liquid in the stomach can cause problems when you are under general anesthesia. BEFORE THE PROCEDURE  Ask your caregiver if you will have to spend the night at the hospital. If you will not have to spend the night, arrange to have an adult drive you and stay with you for 24 hours.  Follow your caregiver's instructions if you are taking dietary supplements or medicines. Your caregiver may tell you to stop taking them or to reduce your dosage.  Do not smoke for as long as possible before your procedure. If possible, stop smoking 3-6 weeks before the procedure.  Do not take new dietary supplements or medicines within 1 week of your procedure unless your caregiver approves them.  Do not eat within 8 hours of your procedure or as directed by your caregiver. Drink only clear liquids, such as water, black coffee (without milk or cream), and fruit juices (without pulp).  Do not drink within 3 hours of your procedure or as directed by your caregiver.  You may brush your teeth on the morning of the procedure, but make sure to spit out the toothpaste and water when finished. PROCEDURE  You will receive anesthetics through a mask, through an intravenous (IV) access tube, or through both. A doctor who specializes in anesthesia (anesthesiologist) or a nurse who specializes in anesthesia (nurse anesthetist) or both will stay with you throughout the procedure to make sure you remain unconscious. He or she will also watch your blood pressure, pulse, and oxygen levels to make sure that the anesthetics do not cause any problems. Once you are asleep, a breathing tube or mask may be used to help you breathe. AFTER THE  PROCEDURE You will wake up after the procedure is complete. You may be in the room where the procedure was performed or in a recovery area. You may have a sore throat if a breathing tube was used. You may also feel:  Dizzy.  Weak.  Drowsy.  Confused.  Nauseous.  Cold. These are all normal responses and can be expected to last for up to 24 hours after the procedure is complete. A caregiver will tell you when you are ready to go home. This will usually be when you are fully awake and in stable condition.   This information is not intended to replace advice given to you by your health care provider. Make sure you discuss any questions you have with your health care provider.   Document Released: 06/21/2007 Document Revised: 04/04/2014 Document Reviewed: 07/13/2011 Elsevier Interactive Patient Education [employer] Inc[employer]

## 2015-12-08 NOTE — H&P (Signed)
  H+P  Reviewed and will be scanned in later. No changes noted. 

## 2015-12-08 NOTE — Op Note (Signed)
PREOPERATIVE DIAGNOSIS:  TONSIL /ADENOID MASS HYPERTROPHY  POSTOPERATIVE DIAGNOSIS: Same  OPERATION:  Tonsillectomy and adenoidectomy.  SURGEON:  Davina Pokehapman [name]. Emmit Oriley, MD  ANESTHESIA:  General endotracheal.  OPERATIVE FINDINGS:  Large tonsils and adenoids.  DESCRIPTION OF THE PROCEDURE:  Ashley [name] was identified in the holding area and taken to the operating room and placed in the supine position.  After general endotracheal anesthesia, the table was turned 45 degrees and the patient was draped in the usual fashion for a tonsillectomy.  A mouth gag was inserted into the oral cavity and examination of the oropharynx showed the uvula was non-bifid.  There was no evidence of submucous cleft to the palate.  There were large tonsils.  A red rubber catheter was placed through the nostril.  Examination of the nasopharynx showed large obstructing adenoids.  Under indirect vision with the mirror, an adenotome was placed in the nasopharynx.  The adenoids were curetted free.  Reinspection with a mirror showed excellent removal of the adenoid.  Nasopharyngeal packs were then placed.  The operation then turned to the tonsillectomy.  Beginning on the left-hand side a tenaculum was used to grasp the tonsil and the Bovie cautery was used to dissect it free from the fossa.  In a similar fashion, the right tonsil was removed.  Meticulous hemostasis was achieved using the Bovie cautery.  With both tonsils removed and no active bleeding, the nasopharyngeal packs were removed.  Suction cautery was then used to cauterize the nasopharyngeal bed to prevent bleeding.  The red rubber catheter was removed with no active bleeding.  0.5% plain Marcaine was used to inject the anterior and posterior tonsillar pillars bilaterally.  A total of 8ml was used.  The patient tolerated the procedure well and was awakened in the operating room and taken to the recovery room in stable condition.   CULTURES:  None.  SPECIMENS:   Tonsils and adenoids.  ESTIMATED BLOOD LOSS:  Less than 20 ml.  Ashley [name]  12/08/2015  9:10 AM

## 2015-12-08 NOTE — Anesthesia Preprocedure Evaluation (Signed)
Anesthesia Evaluation  Patient identified by MRN, date of birth, ID band Patient awake    Reviewed: Allergy & Precautions, NPO status , Patient's Chart, lab work & pertinent test results  History of Anesthesia Complications (+) PONV  Airway Mallampati: II       Dental  (+) Upper Dentures, Chipped   Pulmonary asthma , sleep apnea ,           Cardiovascular negative cardio ROS       Neuro/Psych negative neurological ROS     GI/Hepatic Neg liver ROS, GERD  Medicated and Controlled,  Endo/Other  diabetes (bordeline, improved after Gastric sleeve)  Renal/GU negative Renal ROS     Musculoskeletal  (+) Arthritis , Osteoarthritis,    Abdominal   Peds  Hematology  (+) anemia ,   Anesthesia Other Findings   Reproductive/Obstetrics                             Anesthesia Physical Anesthesia Plan  ASA: II  Anesthesia Plan: General   Post-op Pain Management:    Induction: Intravenous  Airway Management Planned: Oral ETT  Additional Equipment:   Intra-op Plan:   Post-operative Plan:   Informed Consent: I have reviewed the patients History and Physical, chart, labs and discussed the procedure including the risks, benefits and alternatives for the proposed anesthesia with the patient or authorized representative who has indicated his/her understanding and acceptance.     Plan Discussed with:   Anesthesia Plan Comments:         Anesthesia Quick Evaluation

## 2015-12-08 NOTE — Transfer of Care (Signed)
Immediate Anesthesia Transfer of Care Note  Patient: Ashley [name]  Procedure(s) Performed: Procedure(s): TONSILLECTOMY AND ADENOIDECTOMY (N/A)  Patient Location: PACU  Anesthesia Type:General  Level of Consciousness: sedated  Airway & Oxygen Therapy: Patient Spontanous Breathing and Patient connected to face mask oxygen  Post-op Assessment: Report given to RN and Post -op Vital signs reviewed and stable  Post vital signs: Reviewed and stable  Last Vitals:  Vitals:   12/08/15 0758 12/08/15 0925  BP: 99/62 103/65  Pulse: 79   Resp: 16 18  Temp: 36.8 C (!) 36.1 C    Complications: No apparent anesthesia complications

## 2015-12-08 NOTE — Anesthesia Postprocedure Evaluation (Signed)
Anesthesia Post Note  Patient: Ashley [name]  Procedure(s) Performed: Procedure(s) (LRB): TONSILLECTOMY AND ADENOIDECTOMY (N/A)  Patient location during evaluation: PACU Anesthesia Type: General Level of consciousness: awake and alert Pain management: pain level controlled Vital Signs Assessment: post-procedure vital signs reviewed and stable Respiratory status: spontaneous breathing and respiratory function stable Cardiovascular status: stable Anesthetic complications: no    Last Vitals:  Vitals:   12/08/15 1000 12/08/15 1020  BP:  (!) 111/95  Pulse:  69  Resp:  14  Temp: 36.1 C     Last Pain:  Vitals:   12/08/15 1020  TempSrc:   PainSc: 2                  KEPHART,WILLIAM K

## 2015-12-08 NOTE — Anesthesia Procedure Notes (Signed)
Procedure Name: Intubation Date/Time: 12/08/2015 8:33 AM Performed by: Stormy FabianURTIS, Eran Mistry Pre-anesthesia Checklist: Patient identified, Patient being monitored, Timeout performed, Emergency Drugs available and Suction available Patient Re-evaluated:Patient Re-evaluated prior to inductionOxygen Delivery Method: Circle system utilized Preoxygenation: Pre-oxygenation with 100% oxygen Intubation Type: IV induction Ventilation: Mask ventilation without difficulty Laryngoscope Size: Mac and 3 Grade View: Grade I Tube type: Oral Rae Tube size: 7.0 mm Number of attempts: 1 Airway Equipment and Method: Stylet Placement Confirmation: ETT inserted through vocal cords under direct vision,  positive ETCO2 and breath sounds checked- equal and bilateral Secured at: 21 cm Tube secured with: Tape Dental Injury: Teeth and Oropharynx as per pre-operative assessment

## 2015-12-10 LAB — SURGICAL PATHOLOGY

## 2015-12-30 DIAGNOSIS — G4733 Obstructive sleep apnea (adult) (pediatric): Secondary | ICD-10-CM | POA: Diagnosis not present

## 2015-12-30 DIAGNOSIS — E559 Vitamin D deficiency, unspecified: Secondary | ICD-10-CM | POA: Diagnosis not present

## 2015-12-30 DIAGNOSIS — K219 Gastro-esophageal reflux disease without esophagitis: Secondary | ICD-10-CM | POA: Diagnosis not present

## 2015-12-30 DIAGNOSIS — E119 Type 2 diabetes mellitus without complications: Secondary | ICD-10-CM | POA: Diagnosis not present

## 2016-01-07 DIAGNOSIS — K219 Gastro-esophageal reflux disease without esophagitis: Secondary | ICD-10-CM | POA: Diagnosis not present

## 2016-01-07 DIAGNOSIS — Z0001 Encounter for general adult medical examination with abnormal findings: Secondary | ICD-10-CM | POA: Diagnosis not present

## 2016-01-07 DIAGNOSIS — E119 Type 2 diabetes mellitus without complications: Secondary | ICD-10-CM | POA: Diagnosis not present

## 2016-01-07 DIAGNOSIS — D649 Anemia, unspecified: Secondary | ICD-10-CM | POA: Diagnosis not present

## 2016-03-18 DIAGNOSIS — H524 Presbyopia: Secondary | ICD-10-CM | POA: Diagnosis not present

## 2016-03-31 ENCOUNTER — Emergency Department
Admission: EM | Admit: 2016-03-31 | Discharge: 2016-03-31 | Disposition: A | Discharge status: Home / Self Care | Payer: 59 | Attending: Emergency Medicine | Admitting: Emergency Medicine

## 2016-03-31 ENCOUNTER — Encounter: Payer: Self-pay | Admitting: Emergency Medicine

## 2016-03-31 ENCOUNTER — Emergency Department: Payer: 59

## 2016-03-31 DIAGNOSIS — Y999 Unspecified external cause status: Secondary | ICD-10-CM | POA: Insufficient documentation

## 2016-03-31 DIAGNOSIS — S8992XA Unspecified injury of left lower leg, initial encounter: Secondary | ICD-10-CM | POA: Diagnosis not present

## 2016-03-31 DIAGNOSIS — Y939 Activity, unspecified: Secondary | ICD-10-CM | POA: Insufficient documentation

## 2016-03-31 DIAGNOSIS — Y92410 Unspecified street and highway as the place of occurrence of the external cause: Secondary | ICD-10-CM | POA: Insufficient documentation

## 2016-03-31 DIAGNOSIS — Z7984 Long term (current) use of oral hypoglycemic drugs: Secondary | ICD-10-CM | POA: Insufficient documentation

## 2016-03-31 DIAGNOSIS — J45909 Unspecified asthma, uncomplicated: Secondary | ICD-10-CM | POA: Diagnosis not present

## 2016-03-31 DIAGNOSIS — M25562 Pain in left knee: Secondary | ICD-10-CM | POA: Diagnosis not present

## 2016-03-31 DIAGNOSIS — E119 Type 2 diabetes mellitus without complications: Secondary | ICD-10-CM | POA: Insufficient documentation

## 2016-03-31 DIAGNOSIS — S8002XA Contusion of left knee, initial encounter: Secondary | ICD-10-CM | POA: Insufficient documentation

## 2016-03-31 DIAGNOSIS — S79912A Unspecified injury of left hip, initial encounter: Secondary | ICD-10-CM | POA: Diagnosis not present

## 2016-03-31 DIAGNOSIS — M25552 Pain in left hip: Secondary | ICD-10-CM | POA: Diagnosis not present

## 2016-03-31 MED ORDER — PREDNISONE 50 MG PO TABS: 50.0000 mg | ORAL_TABLET | Freq: Every day | ORAL | 0 refills | Status: DC

## 2016-03-31 MED ORDER — OXYCODONE-ACETAMINOPHEN 5-325 MG PO TABS
1.0000 | ORAL_TABLET | Freq: Once | ORAL | Status: DC
  Filled 2016-03-31: qty 1

## 2016-03-31 MED ORDER — KETOROLAC TROMETHAMINE 30 MG/ML IJ SOLN
30.0000 mg | Freq: Once | INTRAMUSCULAR | Status: AC
  Administered 2016-03-31: 30 mg via INTRAMUSCULAR
  Filled 2016-03-31: qty 1

## 2016-03-31 NOTE — ED Notes (Signed)
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
## 2016-03-31 NOTE — ED Notes (Signed)
Pt. Refused wheelchair for departure.
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
## 2016-05-04 ENCOUNTER — Other Ambulatory Visit: Payer: Self-pay | Admitting: Obstetrics and Gynecology

## 2016-05-04 DIAGNOSIS — Z1231 Encounter for screening mammogram for malignant neoplasm of breast: Secondary | ICD-10-CM

## 2016-05-10 ENCOUNTER — Ambulatory Visit
Admission: RE | Admit: 2016-05-10 | Discharge: 2016-05-10 | Disposition: A | Discharge status: Home / Self Care | Payer: 59 | Source: Ambulatory Visit | Attending: Obstetrics and Gynecology | Admitting: Obstetrics and Gynecology

## 2016-05-10 ENCOUNTER — Ambulatory Visit: Payer: 59 | Attending: Orthopedic Surgery | Admitting: Physical Therapy

## 2016-05-10 ENCOUNTER — Encounter: Payer: Self-pay | Admitting: Physical Therapy

## 2016-05-10 DIAGNOSIS — R262 Difficulty in walking, not elsewhere classified: Secondary | ICD-10-CM | POA: Insufficient documentation

## 2016-05-10 DIAGNOSIS — Z1231 Encounter for screening mammogram for malignant neoplasm of breast: Secondary | ICD-10-CM | POA: Diagnosis not present

## 2016-05-10 DIAGNOSIS — R531 Weakness: Secondary | ICD-10-CM | POA: Diagnosis not present

## 2016-05-10 DIAGNOSIS — M25552 Pain in left hip: Secondary | ICD-10-CM | POA: Diagnosis not present
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
## 2016-05-17 ENCOUNTER — Ambulatory Visit: Payer: 59 | Admitting: Physical Therapy

## 2016-05-17 ENCOUNTER — Encounter: Payer: Self-pay | Admitting: Physical Therapy

## 2016-05-17 DIAGNOSIS — R262 Difficulty in walking, not elsewhere classified: Secondary | ICD-10-CM | POA: Diagnosis not present

## 2016-05-17 DIAGNOSIS — R531 Weakness: Secondary | ICD-10-CM | POA: Diagnosis not present

## 2016-05-17 DIAGNOSIS — M25552 Pain in left hip: Secondary | ICD-10-CM
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
## 2016-05-18 ENCOUNTER — Encounter: Payer: Self-pay | Admitting: Physical Therapy

## 2016-05-18 ENCOUNTER — Ambulatory Visit: Payer: 59 | Admitting: Physical Therapy

## 2016-05-18 DIAGNOSIS — R262 Difficulty in walking, not elsewhere classified: Secondary | ICD-10-CM | POA: Diagnosis not present

## 2016-05-18 DIAGNOSIS — M25552 Pain in left hip: Secondary | ICD-10-CM

## 2016-05-18 DIAGNOSIS — R531 Weakness: Secondary | ICD-10-CM | POA: Diagnosis not present
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
## 2016-05-23 ENCOUNTER — Encounter: Payer: Self-pay | Admitting: Physical Therapy

## 2016-05-23 ENCOUNTER — Ambulatory Visit: Payer: 59 | Admitting: Physical Therapy

## 2016-05-23 DIAGNOSIS — R531 Weakness: Secondary | ICD-10-CM

## 2016-05-23 DIAGNOSIS — R262 Difficulty in walking, not elsewhere classified: Secondary | ICD-10-CM | POA: Diagnosis not present

## 2016-05-23 DIAGNOSIS — M25552 Pain in left hip: Secondary | ICD-10-CM | POA: Diagnosis not present
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
## 2016-05-25 ENCOUNTER — Ambulatory Visit: Payer: 59 | Admitting: Physical Therapy

## 2016-05-27 ENCOUNTER — Ambulatory Visit: Payer: 59 | Attending: Orthopedic Surgery

## 2016-05-27 ENCOUNTER — Encounter: Payer: 59 | Admitting: Physical Therapy

## 2016-05-27 DIAGNOSIS — R262 Difficulty in walking, not elsewhere classified: Secondary | ICD-10-CM | POA: Insufficient documentation

## 2016-05-27 DIAGNOSIS — M25552 Pain in left hip: Secondary | ICD-10-CM | POA: Insufficient documentation

## 2016-05-30 ENCOUNTER — Encounter: Payer: 59 | Admitting: Physical Therapy

## 2016-05-30 ENCOUNTER — Ambulatory Visit: Payer: 59

## 2016-05-30 DIAGNOSIS — M25552 Pain in left hip: Secondary | ICD-10-CM | POA: Diagnosis not present

## 2016-05-30 DIAGNOSIS — R262 Difficulty in walking, not elsewhere classified: Secondary | ICD-10-CM | POA: Diagnosis not present
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
## 2016-06-02 ENCOUNTER — Encounter: Payer: Self-pay | Admitting: Physical Therapy

## 2016-06-02 ENCOUNTER — Ambulatory Visit: Payer: 59 | Admitting: Physical Therapy

## 2016-06-02 DIAGNOSIS — R262 Difficulty in walking, not elsewhere classified: Secondary | ICD-10-CM

## 2016-06-02 DIAGNOSIS — M25552 Pain in left hip: Secondary | ICD-10-CM
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
## 2016-06-06 ENCOUNTER — Encounter: Payer: Self-pay | Admitting: Physical Therapy

## 2016-06-06 ENCOUNTER — Ambulatory Visit: Payer: 59 | Admitting: Physical Therapy

## 2016-06-06 DIAGNOSIS — R262 Difficulty in walking, not elsewhere classified: Secondary | ICD-10-CM

## 2016-06-06 DIAGNOSIS — M25552 Pain in left hip: Secondary | ICD-10-CM | POA: Diagnosis not present

## 2016-06-06 DIAGNOSIS — M1612 Unilateral primary osteoarthritis, left hip: Secondary | ICD-10-CM | POA: Diagnosis not present
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
## 2016-06-07 ENCOUNTER — Encounter: Payer: 59 | Admitting: Physical Therapy

## 2016-06-13 ENCOUNTER — Encounter: Payer: 59 | Admitting: Physical Therapy

## 2016-07-20 ENCOUNTER — Encounter: Payer: Self-pay | Admitting: Physical Therapy

## 2016-07-20 NOTE — Therapy (Signed)
[name] MAIN [facility] SERVICES [address] [place], [place], [address] Phone: [phone]   Fax:  [phone]  July 20, 2016   '@[email or web address]' @  Physical Therapy Discharge Summary  Patient: Ashley [name]  MRN: [id number]  Date of Birth: July 19, 1965   Diagnosis:weakness Referring Provider: Carlynn Spry  The above patient had been seen in Physical Therapy 7 times of 17treatments scheduled The treatment consisted of therapeutic exericses The patient is: Improved  Subjective: Patient did not come to her scheduled treatments  Discharge Findings: unable to perform discharge findings as patient did not come to her scheduled appointments    Goals Partially Met    Sincerely,   Adil Tugwell, Sherryl Barters, PT, DPT   CC [email or web address]' @  [place] [address] [place], [place], [address] Phone: [phone]   Fax:  [phone]  Patient: Ashley [name]  MRN: [id number]  Date of Birth: [birth year]

## 2016-07-31 ENCOUNTER — Ambulatory Visit
Admission: EM | Admit: 2016-07-31 | Discharge: 2016-07-31 | Disposition: A | Discharge status: Home / Self Care | Payer: 59 | Attending: Emergency Medicine | Admitting: Emergency Medicine

## 2016-07-31 ENCOUNTER — Encounter: Payer: Self-pay | Admitting: *Deleted

## 2016-07-31 DIAGNOSIS — K5792 Diverticulitis of intestine, part unspecified, without perforation or abscess without bleeding: Secondary | ICD-10-CM

## 2016-07-31 LAB — URINALYSIS, COMPLETE (UACMP) WITH MICROSCOPIC
BILIRUBIN URINE: NEGATIVE
Glucose, UA: NEGATIVE mg/dL
NITRITE: NEGATIVE
PROTEIN: NEGATIVE mg/dL
Specific Gravity, Urine: >1.03 — ABNORMAL HIGH (ref 1.005–1.030)
pH: 5.5 (ref 5.0–8.0)

## 2016-07-31 LAB — CBC WITH DIFFERENTIAL/PLATELET
BASOS ABS: 0.1 10*3/uL (ref 0–0.1)
Basophils Relative: 1 %
EOS ABS: 0.2 10*3/uL (ref 0–0.7)
EOS PCT: 3 %
HCT: 37 % (ref 35.0–47.0)
Hemoglobin: 12.3 g/dL (ref 12.0–16.0)
Lymphocytes Relative: 24 %
Lymphs Abs: 2.3 10*3/uL (ref 1.0–3.6)
MCH: 28.2 pg (ref 26.0–34.0)
MCHC: 33.1 g/dL (ref 32.0–36.0)
MCV: 85 fL (ref 80.0–100.0)
MONO ABS: 0.6 10*3/uL (ref 0.2–0.9)
Monocytes Relative: 6 %
Neutro Abs: 6.5 10*3/uL (ref 1.4–6.5)
Neutrophils Relative %: 66 %
PLATELETS: 303 10*3/uL (ref 150–440)
RBC: 4.35 MIL/uL (ref 3.80–5.20)
RDW: 13.4 % (ref 11.5–14.5)
WBC: 9.8 10*3/uL (ref 3.6–11.0)

## 2016-07-31 LAB — COMPREHENSIVE METABOLIC PANEL
ALT: 60 U/L — AB (ref 14–54)
AST: 41 U/L (ref 15–41)
Albumin: 4.1 g/dL (ref 3.5–5.0)
Alkaline Phosphatase: 115 U/L (ref 38–126)
Anion gap: 6 (ref 5–15)
BUN: 17 mg/dL (ref 6–20)
CHLORIDE: 102 mmol/L (ref 101–111)
CO2: 29 mmol/L (ref 22–32)
CREATININE: 0.71 mg/dL (ref 0.44–1.00)
Calcium: 10.6 mg/dL — ABNORMAL HIGH (ref 8.9–10.3)
Glucose, Bld: 88 mg/dL (ref 65–99)
POTASSIUM: 3.6 mmol/L (ref 3.5–5.1)
SODIUM: 137 mmol/L (ref 135–145)
Total Bilirubin: 0.8 mg/dL (ref 0.3–1.2)
Total Protein: 8.3 g/dL — ABNORMAL HIGH (ref 6.5–8.1)

## 2016-07-31 MED ORDER — ACETAMINOPHEN 500 MG PO TABS
1000.0000 mg | ORAL_TABLET | Freq: Once | ORAL | Status: AC
  Administered 2016-07-31: 1000 mg via ORAL

## 2016-07-31 MED ORDER — CIPROFLOXACIN HCL 500 MG PO TABS: 500.0000 mg | ORAL_TABLET | Freq: Two times a day (BID) | ORAL | 0 refills | Status: DC

## 2016-07-31 MED ORDER — CIPROFLOXACIN HCL 500 MG PO TABS
500.0000 mg | ORAL_TABLET | Freq: Once | ORAL | Status: AC
  Administered 2016-07-31: 500 mg via ORAL

## 2016-07-31 MED ORDER — ONDANSETRON 8 MG PO TBDP: 8.0000 mg | ORAL_TABLET | Freq: Three times a day (TID) | ORAL | 0 refills | Status: DC | PRN

## 2016-07-31 MED ORDER — METRONIDAZOLE 500 MG PO TABS: 500.0000 mg | ORAL_TABLET | Freq: Three times a day (TID) | ORAL | 0 refills | Status: DC

## 2016-07-31 MED ORDER — METRONIDAZOLE 500 MG PO TABS
500.0000 mg | ORAL_TABLET | Freq: Once | ORAL | Status: AC
  Administered 2016-07-31: 500 mg via ORAL

## 2016-07-31 MED ORDER — KETOROLAC TROMETHAMINE 60 MG/2ML IM SOLN
30.0000 mg | Freq: Once | INTRAMUSCULAR | Status: AC
  Administered 2016-07-31: 30 mg via INTRAMUSCULAR

## 2016-07-31 NOTE — Discharge Instructions (Signed)
Bland diet, push electrolyte containing fluids, Tylenol 1000 mg 3-4 times a day as needed for pain. Finish the antibiotics. Follow up with her primary care physician in one to 2 days to make sure that we have you on the right track. Go to the ER for the signs of we discussed such as changing abdominal pain or worsening of your pain, if it hurts to move, fevers above 100.4, vomiting despite the Zofran, or for other concerns

## 2016-07-31 NOTE — ED Provider Notes (Signed)
HPI  SUBJECTIVE:  Ashley [name] is a 51 y.o. female who presents with Constant, nonradiating left lower quadrant pain that has now also migrated to her mid lower abdomen for 3 days. She states the pain is sharp with movement, deep inspiration, sitting up and bending forward. She tried a bland diet, and has taken 2 tabs of leftover doxycycline. Symptoms better with being still, symptoms are worse with deep inspiration, movement, bending, squatting. She reports nausea, but no vomiting or fevers. No abdominal distention, dysuria, urinary urgency, frequency, cloudy or odorous urine, hematuria. No anorexia. She had a normal bowel movement yesterday. She denies melena, hematochezia. No bulging in her  abdomen or groin. She denies vaginal odor, discharge, rash or bleeding. She is in a long-term monogamous relationship with her husband, who is asymptomatic. STDs are not a concern today. No back pain, syncope. No antipyretic in the past 6-8 hours. She states this feels identical to previous episodes of diverticulitis which are usually treated with Cipro and Flagyl. She has been admitted for this twice once for diagnosis and once for pain control/and received a blood transfusion secondary to heavy vaginal bleeding. She has a past medical history of anemia, diabetes. States that her sugars have been running within normal limits. She does not require any medicines for diabetes. Also history of UTI. She states that she does not feel like she has a UTI. She is also status post appendectomy, cholecystectomy, gastric sleeve, C-section, bilateral tubal ligation. She has no history of GI bleed, hypertension, PID, STD, BV, yeast, TOA, torsion, PCOS. LMP: She is postmenopausal. PMD: Dr. Clide Cliff. 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     Past Medical History:  Diagnosis Date  . Allergy   . Anemia   . Arthritis    back, left elbow, left hip, left ankle   . Asthma   . Blood transfusion without  reported diagnosis   . Diabetes mellitus without complication (HCC)    diet controlled  . GERD (gastroesophageal reflux disease)   . PONV (postoperative nausea and vomiting)   . Sleep apnea     Past Surgical History:  Procedure Laterality Date  . CESAREAN SECTION    . CHOLECYSTECTOMY    . ELBOW FRACTURE SURGERY  1995  . FRACTURE SURGERY     left ankle complete repair, left elbow  . HERNIA REPAIR     incisional  . LAPAROSCOPIC GASTRIC SLEEVE RESECTION  2014  . pelvic cap Left 1995  . TONSILLECTOMY AND ADENOIDECTOMY N/A 12/08/2015   Procedure: TONSILLECTOMY AND ADENOIDECTOMY;  Surgeon: Linus Salmons, MD;  Location: [facility] ORS;  Service: ENT;  Laterality: N/A;  . TUBAL LIGATION      Family History  Problem Relation Age of Onset  . Diabetes Mother   . Hypertension Mother   . Cancer Mother   . Asthma Mother   . Diabetes Father   . Hyperlipidemia Father   . Hypertension Father   . Kidney disease Father   . Heart disease Father   . Diabetes Maternal Aunt   . Diabetes Maternal Uncle   . Diabetes Paternal Aunt   . Diabetes Paternal Uncle   . Breast cancer Maternal Grandmother 72    Social History  Substance Use Topics  . Smoking status: Never Smoker  . Smokeless tobacco: Never Used  . Alcohol use No    No current facility-administered medications for this encounter.   Current Outpatient Prescriptions:  .  albuterol (PROVENTIL HFA;VENTOLIN HFA) 108 (90 BASE)  MCG/ACT inhaler, Inhale 2 puffs into the lungs every 6 (six) hours as needed for wheezing or shortness of breath., Disp: , Rfl:  .  Biotin 2500 MCG CAPS, Take 5,000 mcg by mouth daily., Disp: , Rfl:  .  Cholecalciferol 1000 UNITS tablet, Take 5,000 Units by mouth daily., Disp: , Rfl:  .  cyclobenzaprine (FLEXERIL) 5 MG tablet, Take 1 tablet (5 mg total) by mouth 3 (three) times daily as needed for muscle spasms., Disp: 40 tablet, Rfl: 0 .  Dexlansoprazole (DEXILANT PO), Take 1 capsule by mouth., Disp: , Rfl:  .   ferrous sulfate 325 (65 FE) MG tablet, Take 650 mg by mouth 2 (two) times daily with a meal., Disp: , Rfl:  .  Multiple Vitamins-Minerals (MULTIVITAMIN WITH MINERALS) tablet, Take 1 tablet by mouth daily., Disp: , Rfl:  .  polycarbophil (FIBER LAXATIVE) 625 MG tablet, Take 625 mg by mouth daily., Disp: , Rfl:  .  vitamin E (VITAMIN E) 400 UNIT capsule, Take 400 Units by mouth daily., Disp: , Rfl:  .  budesonide-formoterol (SYMBICORT) 80-4.5 MCG/ACT inhaler, Inhale 2 puffs into the lungs daily as needed (shortness of breath). , Disp: , Rfl:  .  ciprofloxacin (CIPRO) 500 MG tablet, Take 1 tablet (500 mg total) by mouth 2 (two) times daily., Disp: 14 tablet, Rfl: 0 .  EPINEPHrine (EPIPEN 2-PAK) 0.3 mg/0.3 mL IJ SOAJ injection, Inject 0.3 mg into the muscle as needed (for anaphylaxis)., Disp: , Rfl:  .  lidocaine (XYLOCAINE) 2 % solution, Use as directed 20 mLs in the mouth or throat every 4 (four) hours as needed for mouth pain., Disp: 100 mL, Rfl: 3 .  metroNIDAZOLE (FLAGYL) 500 MG tablet, Take 1 tablet (500 mg total) by mouth 3 (three) times daily., Disp: 21 tablet, Rfl: 0 .  ondansetron (ZOFRAN ODT) 8 MG disintegrating tablet, Take 1 tablet (8 mg total) by mouth every 8 (eight) hours as needed for nausea or vomiting., Disp: 20 tablet, Rfl: 0  Allergies  Allergen Reactions  . Influenza Vac Split [Flu Virus Vaccine] Anaphylaxis  . Metformin Diarrhea  . Orange Fruit [Citrus] Itching  . Dilaudid [Hydromorphone] Itching  . Penicillins Nausea And Vomiting, Rash and Other (See Comments)    Has patient had a PCN reaction causing immediate rash, facial/tongue/throat swelling, SOB or lightheadedness with hypotension: Yes Has patient had a PCN reaction causing severe rash involving mucus membranes or skin necrosis: No Has patient had a PCN reaction that required hospitalization No Has patient had a PCN reaction occurring within the last 10 years: No If all of the above answers are "NO", then may proceed  with Cephalosporin use.      ROS  As noted in HPI.   Physical Exam  BP 110/71 (BP Location: Left Arm)   Pulse 94   Temp 98.8 F (37.1 C) (Oral)   Resp 16   Ht 5\' 7"  (1.702 m)   Wt 230 lb (104.3 kg)   SpO2 100%   BMI 36.02 kg/m   Constitutional: Well developed, well nourished, no acute distress. Moving around comfortably Eyes:  EOMI, conjunctiva normal bilaterally HENT: Normocephalic, atraumatic,mucus membranes moist Respiratory: Normal inspiratory effort lungs clear bilaterally Cardiovascular: Normal rate regular rhythm no murmurs rubs or gallops GI: Healed surgical scars, soft, normal bowel sounds, nondistended, positive left lower quadrant tenderness, no guarding, rebound. No masses. No flank tenderness. Tap table test negative. Back: No CVA tenderness skin: No rash, skin intact Musculoskeletal: no deformities Neurologic: Alert & oriented x 3,  no focal neuro deficits Psychiatric: Speech and behavior appropriate   ED Course   Medications  ketorolac (TORADOL) injection 30 mg (30 mg Intramuscular Given 07/31/16 1731)  acetaminophen (TYLENOL) tablet 1,000 mg (1,000 mg Oral Given 07/31/16 1732)  metroNIDAZOLE (FLAGYL) tablet 500 mg (500 mg Oral Given 07/31/16 1733)  ciprofloxacin (CIPRO) tablet 500 mg (500 mg Oral Given 07/31/16 1733)    Orders Placed This Encounter  Procedures  . Urine culture    Standing Status:   Standing    Number of Occurrences:   1    Order Specific Question:   Patient immune status    Answer:   Normal  . Urinalysis, Complete w Microscopic    Standing Status:   Standing    Number of Occurrences:   1  . CBC with Differential    Standing Status:   Standing    Number of Occurrences:   1  . Comprehensive metabolic panel    Standing Status:   Standing    Number of Occurrences:   1    Results for orders placed or performed during the hospital encounter of 07/31/16 (from the past 24 hour(s))  Urinalysis, Complete w Microscopic     Status: Abnormal    Collection Time: 07/31/16  4:52 PM  Result Value Ref Range   Color, Urine YELLOW YELLOW   APPearance HAZY (A) CLEAR   Specific Gravity, Urine >1.030 (H) 1.005 - 1.030   pH 5.5 5.0 - 8.0   Glucose, UA NEGATIVE NEGATIVE mg/dL   Hgb urine dipstick SMALL (A) NEGATIVE   Bilirubin Urine NEGATIVE NEGATIVE   Ketones, ur TRACE (A) NEGATIVE mg/dL   Protein, ur NEGATIVE NEGATIVE mg/dL   Nitrite NEGATIVE NEGATIVE   Leukocytes, UA TRACE (A) NEGATIVE   Squamous Epithelial / LPF 0-5 (A) NONE SEEN   WBC, UA 6-30 0 - 5 WBC/hpf   RBC / HPF 6-30 0 - 5 RBC/hpf   Bacteria, UA FEW (A) NONE SEEN   Mucous PRESENT   CBC with Differential     Status: None   Collection Time: 07/31/16  5:21 PM  Result Value Ref Range   WBC 9.8 3.6 - 11.0 K/uL   RBC 4.35 3.80 - 5.20 MIL/uL   Hemoglobin 12.3 12.0 - 16.0 g/dL   HCT 86.5 78.4 - 69.6 %   MCV 85.0 80.0 - 100.0 fL   MCH 28.2 26.0 - 34.0 pg   MCHC 33.1 32.0 - 36.0 g/dL   RDW 29.5 28.4 - 13.2 %   Platelets 303 150 - 440 K/uL   Neutrophils Relative % 66 %   Neutro Abs 6.5 1.4 - 6.5 K/uL   Lymphocytes Relative 24 %   Lymphs Abs 2.3 1.0 - 3.6 K/uL   Monocytes Relative 6 %   Monocytes Absolute 0.6 0.2 - 0.9 K/uL   Eosinophils Relative 3 %   Eosinophils Absolute 0.2 0 - 0.7 K/uL   Basophils Relative 1 %   Basophils Absolute 0.1 0 - 0.1 K/uL  Comprehensive metabolic panel     Status: Abnormal   Collection Time: 07/31/16  5:21 PM  Result Value Ref Range   Sodium 137 135 - 145 mmol/L   Potassium 3.6 3.5 - 5.1 mmol/L   Chloride 102 101 - 111 mmol/L   CO2 29 22 - 32 mmol/L   Glucose, Bld 88 65 - 99 mg/dL   BUN 17 6 - 20 mg/dL   Creatinine, Ser 4.40 0.44 - 1.00 mg/dL   Calcium 10.2 (H)  8.9 - 10.3 mg/dL   Total Protein 8.3 (H) 6.5 - 8.1 g/dL   Albumin 4.1 3.5 - 5.0 g/dL   AST 41 15 - 41 U/L   ALT 60 (H) 14 - 54 U/L   Alkaline Phosphatase 115 38 - 126 U/L   Total Bilirubin 0.8 0.3 - 1.2 mg/dL   GFR calc non Af Amer >60 >60 mL/min   GFR calc Af Amer >60  >60 mL/min   Anion gap 6 5 - 15   No results found.  ED Clinical Impression  Diverticulitis   ED Assessment/Plan  She appears nontoxic, she has no evidence of surgical abdomen at this time. Giving 30 mg of Toradol IM with 1 g of Tylenol. We'll also give patient first dose of Flagyl 500 mg and Cipro 500 mg by mouth for presumptive treatment of diverticulitis as [facility], which is closed today. She is okay with getting her first dose of medicines here and picking up the prescriptions tomorrow.  CMP remarkable for mildly elevated ALT at 60, calcium and total protein. CBC is normal. UA with small hematuria, trace ketones and leukocytes and a few bacteria, but this is a contaminated specimen. Will send this off for culture prior to initiating treatment for UTI as she has no urinary complaints and states that this pain is consistent with her diverticulitis, and not UTI.  Patient is willing to go home and try outpatient treatment, gave her very strict ER return precautions. Advised that she follow-up with her primary care physician in 24-48 hours to make sure that we are on the right track. Discussed labs, MDM, plan and followup with patient. Discussed sn/sx that should prompt return to the ED. Patient agrees with plan.   Meds ordered this encounter  Medications  . ketorolac (TORADOL) injection 30 mg  . acetaminophen (TYLENOL) tablet 1,000 mg  . metroNIDAZOLE (FLAGYL) tablet 500 mg  . ciprofloxacin (CIPRO) tablet 500 mg  . ciprofloxacin (CIPRO) 500 MG tablet    Sig: Take 1 tablet (500 mg total) by mouth 2 (two) times daily.    Dispense:  14 tablet    Refill:  0  . metroNIDAZOLE (FLAGYL) 500 MG tablet    Sig: Take 1 tablet (500 mg total) by mouth 3 (three) times daily.    Dispense:  21 tablet    Refill:  0  . ondansetron (ZOFRAN ODT) 8 MG disintegrating tablet    Sig: Take 1 tablet (8 mg total) by mouth every 8 (eight) hours as needed for  nausea or vomiting.    Dispense:  20 tablet    Refill:  0    *This clinic note was created using Scientist, clinical (histocompatibility and immunogenetics)Dragon dictation software. Therefore, there may be occasional mistakes despite careful proofreading.  ?   Domenick GongMortenson, Dayami Taitt, MD 07/31/16 1753

## 2016-07-31 NOTE — ED Triage Notes (Signed)
Patient started having lower left quadrant pain 4 days ago. Patient does have a history of diverticulitis.

## 2016-08-02 DIAGNOSIS — M1652 Unilateral post-traumatic osteoarthritis, left hip: Secondary | ICD-10-CM | POA: Diagnosis not present

## 2016-08-02 LAB — URINE CULTURE
CULTURE: NO GROWTH
Special Requests: NORMAL

## 2016-08-12 DIAGNOSIS — M25552 Pain in left hip: Secondary | ICD-10-CM | POA: Diagnosis not present

## 2016-08-12 DIAGNOSIS — M25652 Stiffness of left hip, not elsewhere classified: Secondary | ICD-10-CM | POA: Diagnosis not present

## 2016-08-19 DIAGNOSIS — M25652 Stiffness of left hip, not elsewhere classified: Secondary | ICD-10-CM | POA: Diagnosis not present

## 2016-08-19 DIAGNOSIS — M25552 Pain in left hip: Secondary | ICD-10-CM | POA: Diagnosis not present

## 2017-02-13 IMAGING — MG MM DIGITAL SCREENING BILAT W/ TOMO W/ CAD
8 of 12 series · 8 of 28 positions shown · non-contrast
Comparison: Previous exam(s).

CLINICAL DATA: Screening.

EXAM:
2D DIGITAL SCREENING BILATERAL MAMMOGRAM WITH CAD AND ADJUNCT TOMO

[R MLO]
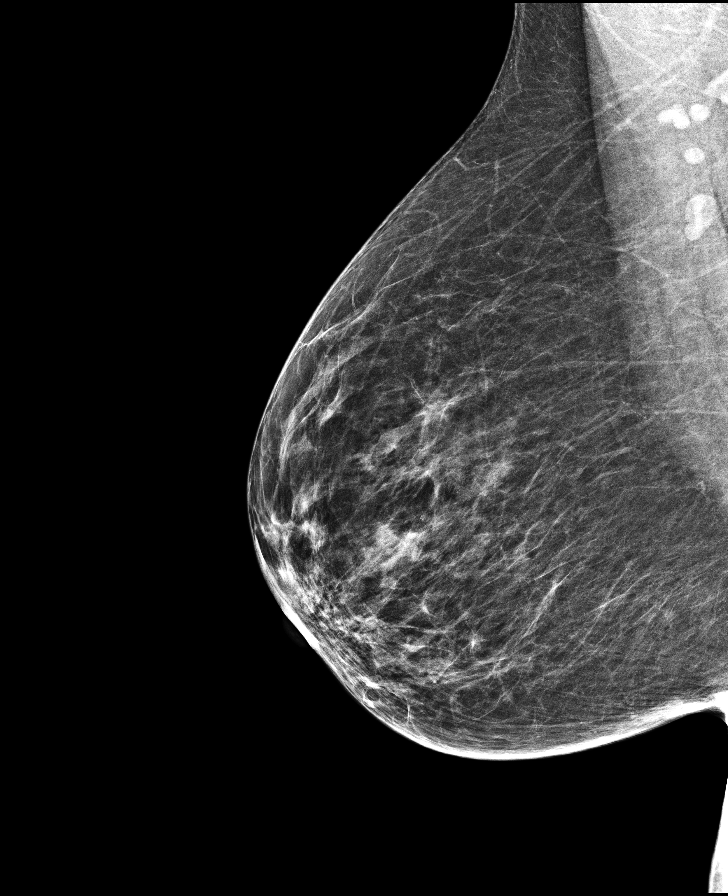

[R CC synth-2D]
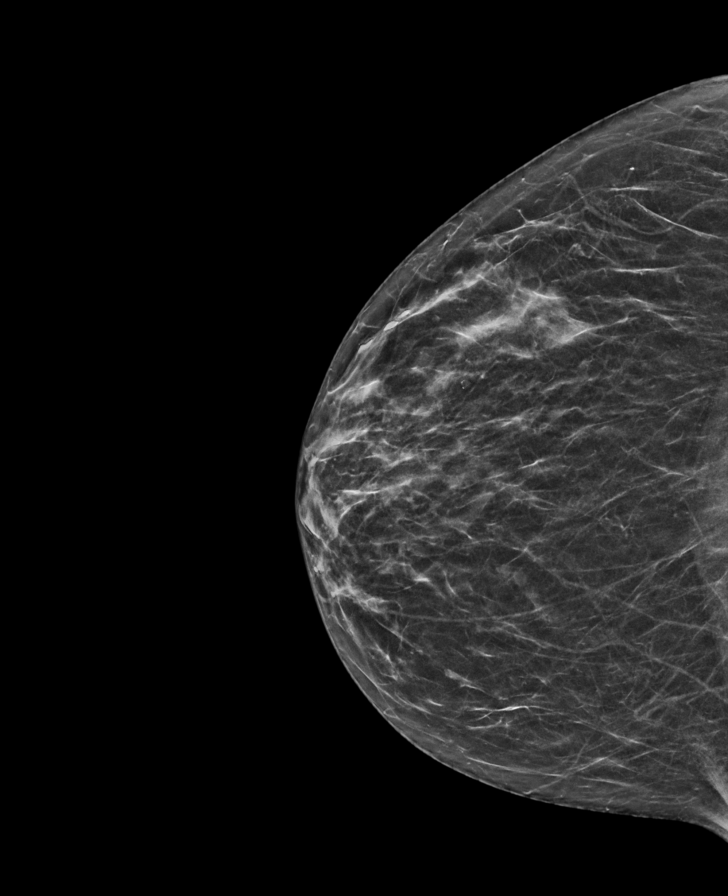

[R MLO synth-2D]
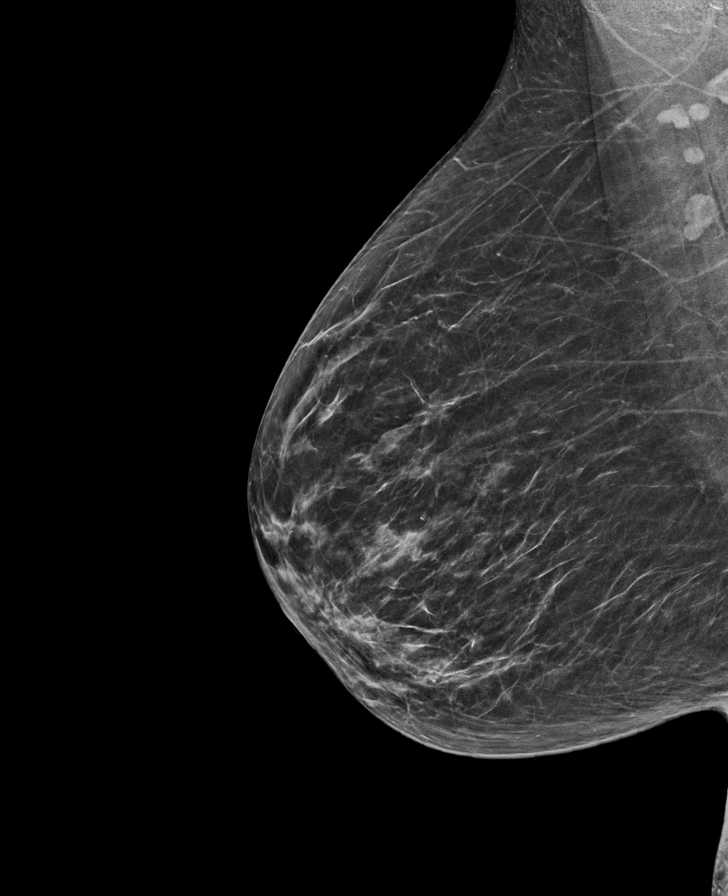

[R CC]
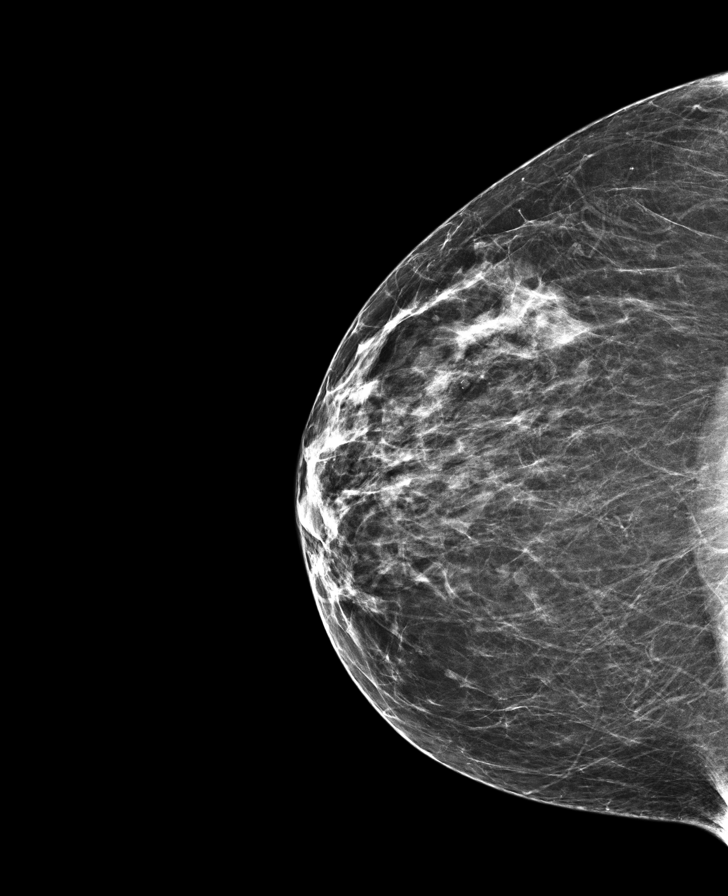

[L MLO synth-2D]
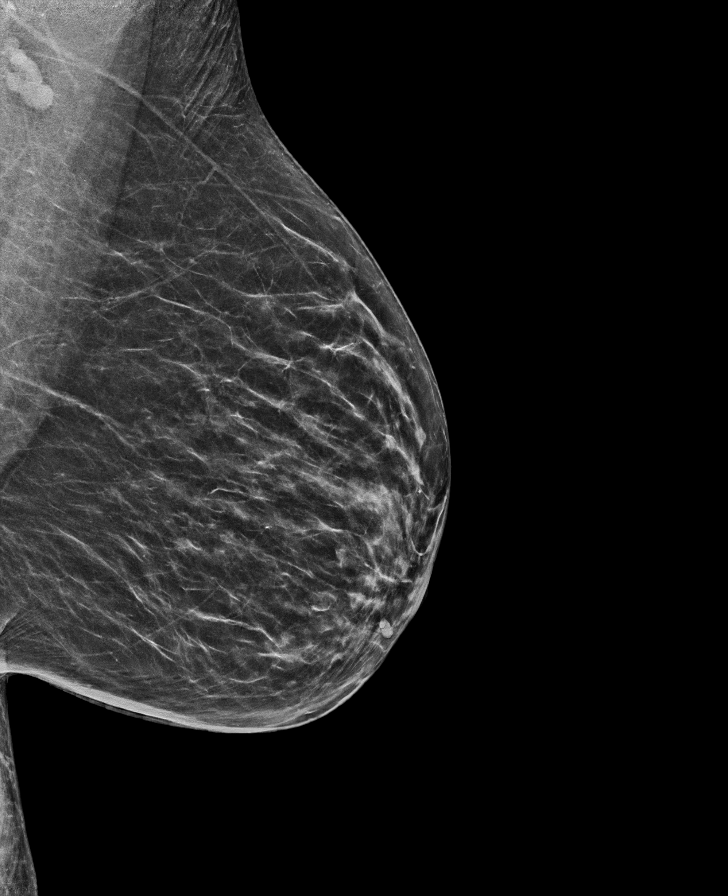

[L CC synth-2D]
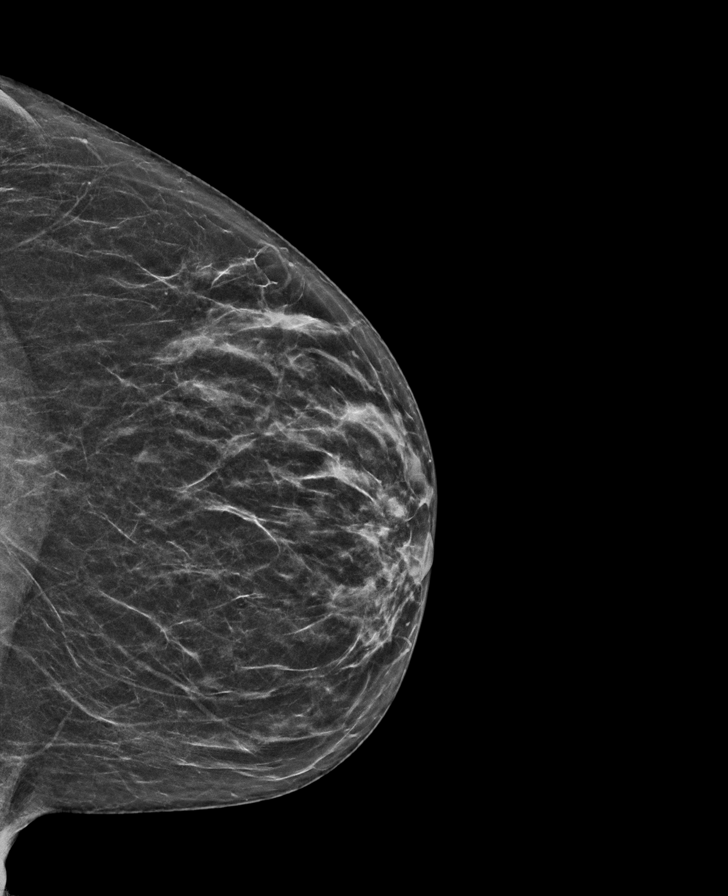

[L CC]
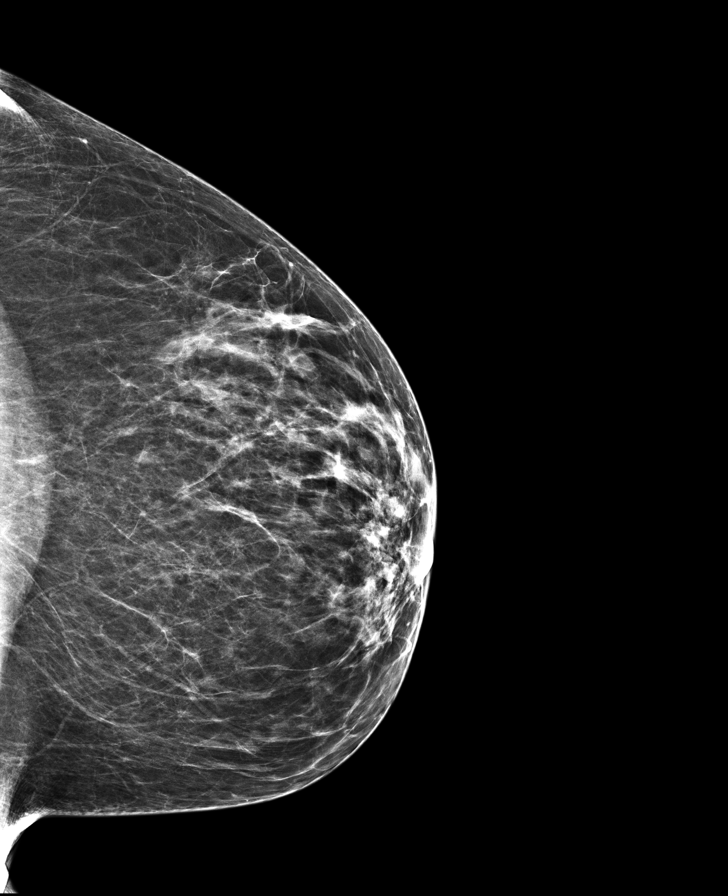

[L MLO]
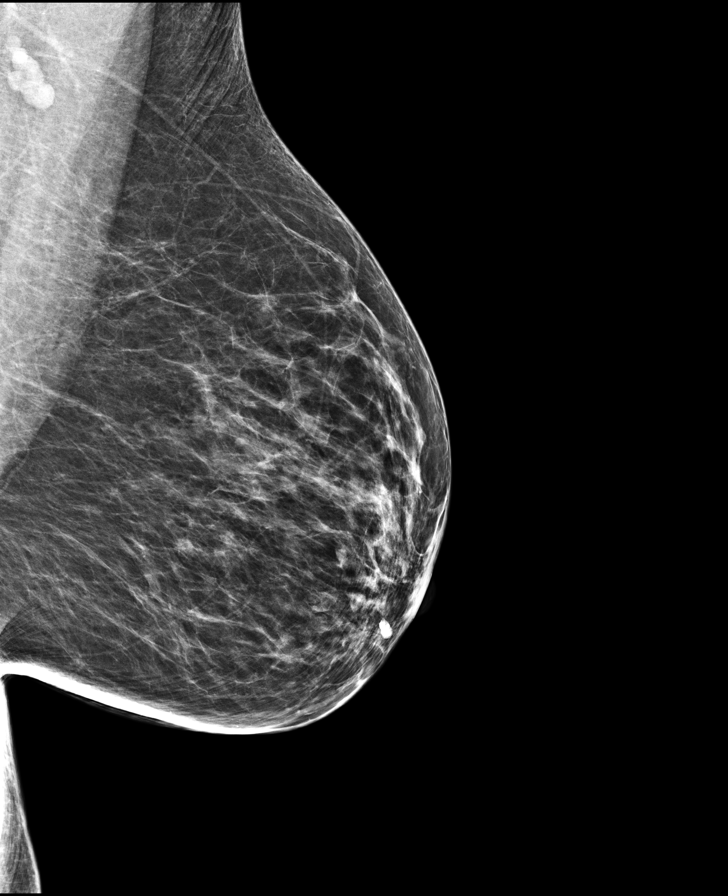

[8 of 28 positions shown; findings below may reference images not displayed]

ACR Breast Density Category b: There are scattered areas of
fibroglandular density.
FINDINGS: There are no findings suspicious for malignancy. Images were
processed with CAD.
IMPRESSION: No mammographic evidence of malignancy. A result letter of this
screening mammogram will be mailed directly to the patient.

RECOMMENDATION:
Screening mammogram in one year. (Code:97-6-RS4)

BI-RADS CATEGORY  1: Negative.

## 2017-02-28 DIAGNOSIS — H5203 Hypermetropia, bilateral: Secondary | ICD-10-CM | POA: Diagnosis not present

## 2017-03-07 DIAGNOSIS — G932 Benign intracranial hypertension: Secondary | ICD-10-CM | POA: Diagnosis not present

## 2017-03-10 ENCOUNTER — Ambulatory Visit
Admission: RE | Admit: 2017-03-10 | Discharge: 2017-03-10 | Disposition: A | Discharge status: Home / Self Care | Payer: 59 | Source: Ambulatory Visit | Attending: Emergency Medicine | Admitting: Emergency Medicine

## 2017-03-10 ENCOUNTER — Ambulatory Visit: Payer: Self-pay | Admitting: Emergency Medicine

## 2017-03-10 VITALS — BP 118/80 | HR 96 | Temp 97.8°F

## 2017-03-10 DIAGNOSIS — M25521 Pain in right elbow: Secondary | ICD-10-CM | POA: Diagnosis not present

## 2017-03-10 DIAGNOSIS — S52124A Nondisplaced fracture of head of right radius, initial encounter for closed fracture: Secondary | ICD-10-CM

## 2017-03-10 DIAGNOSIS — X58XXXA Exposure to other specified factors, initial encounter: Secondary | ICD-10-CM | POA: Insufficient documentation

## 2017-03-10 DIAGNOSIS — S52121A Displaced fracture of head of right radius, initial encounter for closed fracture: Secondary | ICD-10-CM | POA: Diagnosis not present

## 2017-03-10 DIAGNOSIS — M25421 Effusion, right elbow: Secondary | ICD-10-CM | POA: Insufficient documentation

## 2017-03-10 NOTE — Progress Notes (Signed)
S: Is here with complaint of continued right elbow pain.  Patient states that she fell at home this morning with injury to both elbows.  She states that where the left elbow has improved to the right one has continued to get worse with decreased range of motion.  Patient has history of bilateral elbow fractures in 1988.  Patient is use Tylenol and ice with minimal improvement to the right elbow.  She denies any head injury or loss of consciousness. O: Range of motion of the left elbow is without restriction but no gross deformity was noted.  There is marked tenderness on palpation of the right lateral epicondyle.  Patient is unable to rotate or fully extend without increased pain.  Skin is intact.  Pulses present.  Motor sensory function intact distal to the injury.  Grip strength is decreased secondary to pain. A: Questionable right radial head fracture. P: We will obtain imaging of her right elbow.  Patient was placed in a sling and sent to [address].  Patient will return to the clinic after her x-rays have been obtained.  X-ray revealed nondisplaced radial head fracture with fusion.  Patient was made aware.  Prescription for tramadol 50 mg 1 every 6 hours as needed for pain.  Ice and elevation.  Patient will go to the orthopedic walk-in clinic for an OCL splint and continued care as she is already established with Dr. Odis LusterBowers.

## 2017-03-14 ENCOUNTER — Ambulatory Visit: Payer: 59 | Attending: Orthopedic Surgery | Admitting: Occupational Therapy

## 2017-03-14 ENCOUNTER — Encounter: Payer: Self-pay | Admitting: Occupational Therapy

## 2017-03-14 DIAGNOSIS — R278 Other lack of coordination: Secondary | ICD-10-CM | POA: Diagnosis not present

## 2017-03-14 DIAGNOSIS — S52121A Displaced fracture of head of right radius, initial encounter for closed fracture: Secondary | ICD-10-CM | POA: Diagnosis not present

## 2017-03-14 DIAGNOSIS — M6281 Muscle weakness (generalized): Secondary | ICD-10-CM | POA: Insufficient documentation

## 2017-03-14 NOTE — Therapy (Addendum)
[facility] MAIN [facility] SERVICES [address] [place], [place], [address] Phone: [phone]   Fax:  [phone]  Occupational Therapy Evaluation  Patient Details  Name: Ashley [name] MRN: [id number] Date of Birth: [birth year] No Data Recorded  Encounter Date: 03/14/2017  OT End of Session - 03/14/17 1732    Visit Number  1    Number of Visits  12    Date for OT Re-Evaluation  04/25/17    OT Start Time  1610    OT Stop Time  1700    OT Time Calculation (min)  50 min    Activity Tolerance  Patient tolerated treatment well;Patient limited by pain    Behavior During Therapy  [facility] for tasks assessed/performed       Past Medical History:  Diagnosis Date  . Allergy   . Anemia   . Arthritis    back, left elbow, left hip, left ankle   . Asthma   . Blood transfusion without reported diagnosis   . Diabetes mellitus without complication (HCC)    diet controlled  . GERD (gastroesophageal reflux disease)   . PONV (postoperative nausea and vomiting)   . Sleep apnea     Past Surgical History:  Procedure Laterality Date  . CESAREAN SECTION    . CHOLECYSTECTOMY    . ELBOW FRACTURE SURGERY  1995  . FRACTURE SURGERY     left ankle complete repair, left elbow  . HERNIA REPAIR     incisional  . LAPAROSCOPIC GASTRIC SLEEVE RESECTION  2014  . pelvic cap Left 1995  . TONSILLECTOMY AND ADENOIDECTOMY N/A 12/08/2015   Procedure: TONSILLECTOMY AND ADENOIDECTOMY;  Surgeon: Linus Salmons, MD;  Location: [facility] ORS;  Service: ENT;  Laterality: N/A;  . TUBAL LIGATION      There were no vitals filed for this visit.  Subjective Assessment - 03/14/17 1720    Subjective   Pt. reports she is planning to return to work on light duty.    Pertinent History  Pt. is a 51 y.o. female who sustained a Right closed fx of the Radial Head. Pt. injured her dominant RUE after getting out of the car, and passing her grandaughter to her daughter.     Currently  in Pain?  Yes    Pain Score  5  0/10 at rest currently, 5/10 at its worst the day of the injury, with movement.    Pain Location  Elbow    Pain Orientation  Right    Pain Type  Acute pain    Pain Onset  In the past 7 days        [facility] OT Assessment - 03/14/17 1619      Assessment   Medical Diagnosis  Radial Head Fracture    Referring Provider  Dr. Odis Luster    Onset Date/Surgical Date  03/10/17    Hand Dominance  Right    Next MD Visit  04/11/2017      Precautions   Precautions No lifting weight, pulling, or reaching overhead. No other restrictions for RUE or ROM . Per clarification with Dr. Odis Luster office.   Required Braces or Orthoses  -- Elbow brace      Balance Screen   Has the patient fallen in the past 6 months  Yes    How many times?  1    Has the patient had a decrease in activity level because of a fear of falling?   Yes    Is the  patient reluctant to leave their home because of a fear of falling?   No      Home  Environment   Family/patient expects to be discharged to:  Private residence    Living Arrangements  Spouse/significant other    Type of Home  House    Home Access  Stairs    Home Layout  One level    Bathroom Shower/Tub  Tub/Shower unit    Shower/tub characteristics  Curtain    Bathroom Accessibility  Yes    Home Equipment  [place] - single point;Walker - 4 wheels    Lives With  Spouse;Family      Prior Function   Level of Independence  Independent    Vocation  Full time employment    Vocation Requirements  -- ER  Licensed conveyancer, ER nurse tech    Leisure  -- spending time with family, reading      ADL   Eating/Feeding  Set up    Grooming  Moderate assistance    Lower Body Bathing  Minimal assistance    Architect Expression   Dominant Hand  Right      Vision - History   Baseline Vision  Wears glasses all the time      Activity Tolerance   Activity  Tolerance  Tolerates 30 min activity with multiple rests      Cognition   Overall Cognitive Status  Within Functional Limits for tasks assessed      Sensation   Additional Comments Edema Reports  pins and needles initially at onset  Pt. Reports edema is improving since initial onset of injury.     Coordination   Right 9 Hole Peg Test  32 sec.    Left 9 Hole Peg Test  26 sec.      AROM   Overall AROM Comments  Right elbow flexion 85, elbow extension -30, wrist extension 52, supination: WNL, pronation: 74       Hand Function   Right Hand Grip (lbs)  35#  dynamometer completely supported by therapist.   Right Hand Lateral Pinch  14 lbs    Right Hand 3 Point Pinch  7 lbs    Left Hand Grip (lbs)  58#    Left Hand Lateral Pinch  16 lbs    Left 3 point pinch  17 lbs                      OT Education - 03/14/17 1732    Education provided  Yes    Education Details  RUE fuctioning    Person(s) Educated  Patient    Methods  Explanation    Comprehension  Verbalized understanding          OT Long Term Goals - 03/14/17 1744      OT LONG TERM GOAL #1   Title  Pt. will improve elbow flexion to be independent with self-feeding skills, and hand to mouth patterns.    Baseline  Eval: limited    Time  6    Period  Weeks    Status  New    Target Date  04/25/17      OT LONG TERM GOAL #2   Title  Pt. will improve elbow extension to assis with UE dressing.    Baseline  Eval: limited elbow extension  Time  6    Period  Weeks    Status  New    Target Date  04/25/17      OT LONG TERM GOAL #3   Title  Pt. will improve right pronation to be able to place UE in a position of comfort.    Baseline  Eval: limited    Time  6    Period  Weeks    Status  New    Target Date  04/25/17      OT LONG TERM GOAL #4   Title  Pt. will improve right grip strength by 10# to be able to hold a glass.    Baseline  Eval: limited    Time  6    Period  Weeks    Status  New    Target  Date  04/25/17      OT LONG TERM GOAL #5   Title  Pt. will  will improve 3pt. pinch by by 3# to be able to open packages.    Baseline  Eval: limited    Time  6    Period  Weeks    Status  New    Target Date  04/25/17      Long Term Additional Goals   Additional Long Term Goals  Yes      OT LONG TERM GOAL #6   Title  Pt. will improve Right hand speed, and dexterity by 3 sec. of speed for improved typing speed, and writing legibility.    Baseline  Eval: limited    Time  6    Period  Weeks    Status  New    Target Date  04/25/17      OT LONG TERM GOAL #7   Title  Pt. will be indpendent with HEP for RUE    Baseline  Eval:     Time  6    Period  Weeks    Status  New    Target Date  04/25/17            Plan - 03/14/17 1734    Clinical Impression Statement  Pt. is a 51 y. o. female who sustained a radial head fracture to the dominant RUE. Pt. presents with limitations in ROM, weakness, pain, and impaired [facility] skills. Pt. presents with limited elbow flexion, extension, supination, pronation, grip strength, pinch strength. Per Dr. Odis LusterBowers, Pt. has restrictions for no reaching overhead, no lifting weight, or pulling with the RUE. Pt. Has no other restrictions for the RUE or ROM. These limitations hinder her ability to complete basic ADL, and IADL functioning. Pt. Benefits from OT skilled services to optimize ADL, and IADL performance.   Occupational performance deficits (Please refer to evaluation for details):  ADL's;IADL's;Work;Leisure;Social Participation    Rehab Potential  Good    Current Impairments/barriers affecting progress:  multiple comorbidities    OT Frequency  2x / week    OT Duration  6 weeks    OT Treatment/Interventions  Self-care/ADL training;Patient/family education;Therapeutic exercise;Therapeutic activities;Moist Heat;Passive range of motion;Energy conservation    Clinical Decision Making  Several treatment options, min-mod task modification necessary     Consulted and Agree with Plan of Care  Patient       Patient will benefit from skilled therapeutic intervention in order to improve the following deficits and impairments:  Pain, Increased edema, Decreased range of motion, Decreased coordination, Impaired UE functional use, Decreased activity tolerance, Decreased strength, Decreased endurance  Visit Diagnosis: Muscle weakness (  generalized) - Plan: Ot plan of care cert/re-cert  Other lack of coordination - Plan: Ot plan of care cert/re-cert  G-Codes - 03/14/17 1819    Functional Assessment Tool Used (Outpatient only)  clinical obsrevation based on pt. cureent funational status    Functional Limitation  Carrying, moving and handling objects    Carrying, Moving and Handling Objects Current Status ([id number])  At least 60 percent but less than 80 percent impaired, limited or restricted    Carrying, Moving and Handling Objects Goal Status (U0454(G8985)  At least 1 percent but less than 20 percent impaired, limited or restricted       Problem List Patient Active Problem List   Diagnosis Date Noted  . Intractable pain 04/12/2015    Olegario MessierElaine Giavonni Cizek, MS, OTR/L 03/14/2017, 6:27 PM   [facility] [address] [place], [place], [address] Phone: [phone]   Fax:  [phone]  Name: Ashley [name] MRN: [id number] Date of Birth: Jan 29, 1966

## 2017-03-15 ENCOUNTER — Ambulatory Visit: Payer: 59 | Admitting: Physical Therapy

## 2017-03-15 DIAGNOSIS — H04123 Dry eye syndrome of bilateral lacrimal glands: Secondary | ICD-10-CM | POA: Diagnosis not present

## 2017-03-20 ENCOUNTER — Ambulatory Visit: Payer: 59 | Admitting: Occupational Therapy

## 2017-03-20 ENCOUNTER — Encounter: Payer: Self-pay | Admitting: Occupational Therapy

## 2017-03-20 DIAGNOSIS — M6281 Muscle weakness (generalized): Secondary | ICD-10-CM

## 2017-03-20 DIAGNOSIS — R278 Other lack of coordination: Secondary | ICD-10-CM

## 2017-03-20 NOTE — Therapy (Signed)
[facility] [address] [place], [place], [address] Phone: [phone]   Fax:  [phone]  Occupational Therapy Treatment  Patient Details  Name: Ashley JohnsGlenda D Calloway-Nibblett MRN: [id number] Date of Birth: [birth year] No Data Recorded  Encounter Date: 03/20/2017  OT End of Session - 03/20/17 1345    Visit Number  2    Number of Visits  12    Date for OT Re-Evaluation  04/25/17    OT Start Time  0845    OT Stop Time  0930    OT Time Calculation (min)  45 min    Activity Tolerance  Patient tolerated treatment well    Behavior During Therapy  [facility] for tasks assessed/performed       Past Medical History:  Diagnosis Date  . Allergy   . Anemia   . Arthritis    back, left elbow, left hip, left ankle   . Asthma   . Blood transfusion without reported diagnosis   . Diabetes mellitus without complication (HCC)    diet controlled  . GERD (gastroesophageal reflux disease)   . PONV (postoperative nausea and vomiting)   . Sleep apnea     Past Surgical History:  Procedure Laterality Date  . CESAREAN SECTION    . CHOLECYSTECTOMY    . ELBOW FRACTURE SURGERY  1995  . FRACTURE SURGERY     left ankle complete repair, left elbow  . HERNIA REPAIR     incisional  . LAPAROSCOPIC GASTRIC SLEEVE RESECTION  2014  . pelvic cap Left 1995  . TONSILLECTOMY AND ADENOIDECTOMY N/A 12/08/2015   Procedure: TONSILLECTOMY AND ADENOIDECTOMY;  Surgeon: Linus Salmonshapman McQueen, MD;  Location: [facility] ORS;  Service: ENT;  Laterality: N/A;  . TUBAL LIGATION      There were no vitals filed for this visit.  Subjective Assessment - 03/20/17 1343    Subjective   Pt. reports she had 3/10 pain earlier this morning when she and her husband were putting on the brace. Pt. reports she had to take something for pain. No reports of pain during therapy session.    Pertinent History  Pt. is a 51 y.o. female who sustained a Right closed fx of the Radial Head. Pt. injured  her dominant RUE after getting out of the car, and passing her grandaughter to her daughter.     Currently in Pain?  No/denies         [facility] - 03/20/17 0932      AROM   Overall AROM Comments  Right elbow flexion: 128, elbow extension -14, and pronation 79 degrees.       OT TREATMENT    Therapeutic Exercise:  Pt. worked on AROM for elbow flexion, extension, forearm supination, and pronation. Pt. worked on isometric ex. for elbow flexion, extension, forearm supination, pronation, wrist flexion, and extension. Pt. required visual demonstration, and verbal cues. Pt. worked with pink theraputty for gross grip, lateral pinch, 3pt. pinch, and 2pt. Pinch with cues for proper technique. Pt. was provided with a visual HEP handout.  Selfcare:  Reviewed brace application. Pt. education was provided about an easier method for donning brace.                   OT Education - 03/20/17 1345    Education provided  Yes    Education Details  RUE functioning    Person(s) Educated  Patient    Methods  Explanation    Comprehension  Verbalized  understanding          OT Long Term Goals - 03/14/17 1744      OT LONG TERM GOAL #1   Title  Pt. will improve elbow flexion to be independent with self-feeding skills, and hand to mouth patterns.    Baseline  Eval: limited    Time  6    Period  Weeks    Status  New    Target Date  04/25/17      OT LONG TERM GOAL #2   Title  Pt. will improve elbow extension to assis with UE dressing.    Baseline  Eval: limited elbow extension    Time  6    Period  Weeks    Status  New    Target Date  04/25/17      OT LONG TERM GOAL #3   Title  Pt. will improve right pronation to be able to place UE in a position of comfort.    Baseline  Eval: limited    Time  6    Period  Weeks    Status  New    Target Date  04/25/17      OT LONG TERM GOAL #4   Title  Pt. will improve right grip strength by 10# to be able to hold a glass.     Baseline  Eval: limited    Time  6    Period  Weeks    Status  New    Target Date  04/25/17      OT LONG TERM GOAL #5   Title  Pt. will  will improve 3pt. pinch by by 3# to be able to open packages.    Baseline  Eval: limited    Time  6    Period  Weeks    Status  New    Target Date  04/25/17      Long Term Additional Goals   Additional Long Term Goals  Yes      OT LONG TERM GOAL #6   Title  Pt. will improve Right hand speed, and dexterity by 3 sec. of speed for improved typing speed, and writing legibility.    Baseline  Eval: limited    Time  6    Period  Weeks    Status  New    Target Date  04/25/17      OT LONG TERM GOAL #7   Title  Pt. will be indpendent with HEP for RUE    Baseline  Eval:     Time  6    Period  Weeks    Status  New    Target Date  04/25/17            Plan - 03/20/17 1346    Clinical Impression Statement Pt. reports she has returned to work. Pt. is improving with AROM in her right elbow flexion, extension, and pronation. Pt. continues to have limited ROM, limited  functional hand use, and restrictions with the RUE.  Reviewed brace application with the pt., and an easier technique for donning the brace. Pt. was provided with a HEP for RUE ex., and pink theraputty ex.  Pt. conitnues to have difficulty with writing, and typing tasks. Pt. Continues to benefit from OT services to improve RUE ROM, and functional use for ADL, and IADL tasks.   Occupational performance deficits (Please refer to evaluation for details):  ADL's;IADL's;Leisure    Rehab Potential  Good    Current Impairments/barriers  affecting progress:  multiple comorbidities    OT Frequency  2x / week    OT Duration  6 weeks    OT Treatment/Interventions  Self-care/ADL training;Patient/family education;Therapeutic exercise;Therapeutic activities;Moist Heat;Passive range of motion;Energy conservation    Clinical Decision Making  Several treatment options, min-mod task modification necessary     Consulted and Agree with Plan of Care  Patient       Patient will benefit from skilled therapeutic intervention in order to improve the following deficits and impairments:  Pain, Increased edema, Decreased range of motion, Decreased coordination, Impaired UE functional use, Decreased activity tolerance, Decreased strength, Decreased endurance  Visit Diagnosis: Muscle weakness (generalized)  Other lack of coordination    Problem List Patient Active Problem List   Diagnosis Date Noted  . Intractable pain 04/12/2015    Olegario MessierElaine Kveon Casanas, MS,OTR/L 03/20/2017, 1:57 PM  [facility] [address] [place], [place], [address] Phone: [phone]   Fax:  [phone]  Name: Ashley JohnsGlenda D Calloway-Nibblett MRN: [id number] Date of Birth: [birth year]

## 2017-03-22 ENCOUNTER — Encounter: Payer: Self-pay | Admitting: Occupational Therapy

## 2017-03-22 ENCOUNTER — Ambulatory Visit: Payer: 59 | Admitting: Occupational Therapy

## 2017-03-22 DIAGNOSIS — M6281 Muscle weakness (generalized): Secondary | ICD-10-CM | POA: Diagnosis not present

## 2017-03-22 DIAGNOSIS — R278 Other lack of coordination: Secondary | ICD-10-CM | POA: Diagnosis not present

## 2017-03-22 NOTE — Therapy (Signed)
[facility] [address] [place], [place], [address] Phone: [phone]   Fax:  [phone]  Occupational Therapy Treatment  Patient Details  Name: Ashley [name] MRN: [id number] Date of Birth: 09-29-1965 No Data Recorded  Encounter Date: 03/22/2017  OT End of Session - 03/22/17 1723    Visit Number  3    Number of Visits  12    Date for OT Re-Evaluation  04/25/17    OT Start Time  1433    OT Stop Time  1515    OT Time Calculation (min)  42 min       Past Medical History:  Diagnosis Date  . Allergy   . Anemia   . Arthritis    back, left elbow, left hip, left ankle   . Asthma   . Blood transfusion without reported diagnosis   . Diabetes mellitus without complication (HCC)    diet controlled  . GERD (gastroesophageal reflux disease)   . PONV (postoperative nausea and vomiting)   . Sleep apnea     Past Surgical History:  Procedure Laterality Date  . CESAREAN SECTION    . CHOLECYSTECTOMY    . ELBOW FRACTURE SURGERY  1995  . FRACTURE SURGERY     left ankle complete repair, left elbow  . HERNIA REPAIR     incisional  . LAPAROSCOPIC GASTRIC SLEEVE RESECTION  2014  . pelvic cap Left 1995  . TONSILLECTOMY AND ADENOIDECTOMY N/A 12/08/2015   Procedure: TONSILLECTOMY AND ADENOIDECTOMY;  Surgeon: Linus Salmonshapman McQueen, MD;  Location: [facility] ORS;  Service: ENT;  Laterality: N/A;  . TUBAL LIGATION      There were no vitals filed for this visit.  Subjective Assessment - 03/22/17 1710    Subjective   Pt. reports she is waiting for her family to arrive today for her holiday celebration.    Pertinent History  Pt. is a 51 y.o. female who sustained a Right closed fx of the Radial Head. Pt. injured her dominant RUE after getting out of the car, and passing her grandaughter to her daughter.     Currently in Pain?  No/denies    Pain Score  0-No pain         [facility] OT Assessment - 03/22/17 1713      AROM   Overall  AROM Comments  Right elbow flexion: 130, extension: -12, pronation: 80 degrees.        OT TREATMENT    Therapeutic Exercise:  Pt. Performed AROM, and isometric exercises for right elbow flexion, extension, forearm supination, and pronation. Pt. Education was provided for form, and technique. Additional theraputty exercises were added to her HEP using pink theraputty. Pt. Was provided with visual demonstration, and verbal cues for the putty exercises. Pt. Was provided with a visual handout for gross gripping, lateral pinch, 3pt. Pinch, 2pt. Pinch, digit extension, thumb abduction, and thumb opposition to the 2nd through 5th digits.                        OT Long Term Goals - 03/14/17 1744      OT LONG TERM GOAL #1   Title  Pt. will improve elbow flexion to be independent with self-feeding skills, and hand to mouth patterns.    Baseline  Eval: limited    Time  6    Period  Weeks    Status  New    Target Date  04/25/17  OT LONG TERM GOAL #2   Title  Pt. will improve elbow extension to assis with UE dressing.    Baseline  Eval: limited elbow extension    Time  6    Period  Weeks    Status  New    Target Date  04/25/17      OT LONG TERM GOAL #3   Title  Pt. will improve right pronation to be able to place UE in a position of comfort.    Baseline  Eval: limited    Time  6    Period  Weeks    Status  New    Target Date  04/25/17      OT LONG TERM GOAL #4   Title  Pt. will improve right grip strength by 10# to be able to hold a glass.    Baseline  Eval: limited    Time  6    Period  Weeks    Status  New    Target Date  04/25/17      OT LONG TERM GOAL #5   Title  Pt. will  will improve 3pt. pinch by by 3# to be able to open packages.    Baseline  Eval: limited    Time  6    Period  Weeks    Status  New    Target Date  04/25/17      Long Term Additional Goals   Additional Long Term Goals  Yes      OT LONG TERM GOAL #6   Title  Pt. will improve  Right hand speed, and dexterity by 3 sec. of speed for improved typing speed, and writing legibility.    Baseline  Eval: limited    Time  6    Period  Weeks    Status  New    Target Date  04/25/17      OT LONG TERM GOAL #7   Title  Pt. will be indpendent with HEP for RUE    Baseline  Eval:     Time  6    Period  Weeks    Status  New    Target Date  04/25/17            Plan - 03/22/17 1716    Clinical Impression Statement  Pt. reports having a follow-up appointment with Dr. Maxcine Ham on Jan. 15th, 2019. Pt. reports she is now donning the brace independently using the method reviewed last session. Pt. reported no pain during the session, and reports pain has improved overall. Pt. AROM is improving with elbow flexion, extension, and pronation. More exercises were added to pt.'s HEP using pink theraputty. AROM, and isometric exercises for elbow flexion, extension, supination, pronation, wrist flexion, and extension were reviewed with pt. Pt. tolerated the exercises well without pain.     Occupational performance deficits (Please refer to evaluation for details):  ADL's;IADL's    Rehab Potential  Good    Current Impairments/barriers affecting progress:  multiple comorbidities    OT Frequency  2x / week    OT Duration  6 weeks    OT Treatment/Interventions  Self-care/ADL training;Patient/family education;Therapeutic exercise;Therapeutic activities;Moist Heat;Passive range of motion;Energy conservation    Clinical Decision Making  Several treatment options, min-mod task modification necessary    Consulted and Agree with Plan of Care  Patient       Patient will benefit from skilled therapeutic intervention in order to improve the following deficits and impairments:  Pain, Increased  edema, Decreased range of motion, Decreased coordination, Impaired UE functional use, Decreased activity tolerance, Decreased strength, Decreased endurance  Visit Diagnosis: Muscle weakness  (generalized)    Problem List Patient Active Problem List   Diagnosis Date Noted  . Intractable pain 04/12/2015    Olegario MessierElaine Keriana Sarsfield, MS, OTR/L 03/22/2017, 5:24 PM  [facility] [address] [place], [place], [address] Phone: [phone]   Fax:  [phone]  Name: Ashley [name] MRN: [id number] Date of Birth: 03-Jan-1966

## 2017-03-23 ENCOUNTER — Encounter: Payer: Self-pay | Admitting: Occupational Therapy

## 2017-03-23 ENCOUNTER — Telehealth: Payer: Self-pay | Admitting: Occupational Therapy

## 2017-03-23 NOTE — Therapy (Deleted)
[facility] [facility] [address] [place], [place], [address] Phone: [phone]   Fax:  [phone]  Occupational Therapy Treatment  Patient Details  Name: Ashley JohnsGlenda D Calloway-Nibblett MRN: [id number] Date of Birth: Dec 14, 1965 No Data Recorded  Encounter Date: 03/23/2017  OT End of Session - 03/22/17 1723    Visit Number  3    Number of Visits  12    Date for OT Re-Evaluation  04/25/17    OT Start Time  1433    OT Stop Time  1515    OT Time Calculation (min)  42 min       Past Medical History:  Diagnosis Date  . Allergy   . Anemia   . Arthritis    back, left elbow, left hip, left ankle   . Asthma   . Blood transfusion without reported diagnosis   . Diabetes mellitus without complication (HCC)    diet controlled  . GERD (gastroesophageal reflux disease)   . PONV (postoperative nausea and vomiting)   . Sleep apnea     Past Surgical History:  Procedure Laterality Date  . CESAREAN SECTION    . CHOLECYSTECTOMY    . ELBOW FRACTURE SURGERY  1995  . FRACTURE SURGERY     left ankle complete repair, left elbow  . HERNIA REPAIR     incisional  . LAPAROSCOPIC GASTRIC SLEEVE RESECTION  2014  . pelvic cap Left 1995  . TONSILLECTOMY AND ADENOIDECTOMY N/A 12/08/2015   Procedure: TONSILLECTOMY AND ADENOIDECTOMY;  Surgeon: Linus Salmonshapman McQueen, MD;  Location: [facility] ORS;  Service: ENT;  Laterality: N/A;  . TUBAL LIGATION      There were no vitals filed for this visit.  Subjective Assessment - 03/22/17 1710    Subjective   Pt. reports she is waiting for her family to arrive today for her holiday celebration.    Pertinent History  Pt. is a 51 y.o. female who sustained a Right closed fx of the Radial Head. Pt. injured her dominant RUE after getting out of the car, and passing her grandaughter to her daughter.     Currently in Pain?  No/denies    Pain Score  0-No pain         [facility] OT Assessment - 03/22/17 1713      AROM   Overall  AROM Comments  Right elbow flexion: 130, extension: -12, pronation: 80 degrees.                            OT Long Term Goals - 03/14/17 1744      OT LONG TERM GOAL #1   Title  Pt. will improve elbow flexion to be independent with self-feeding skills, and hand to mouth patterns.    Baseline  Eval: limited    Time  6    Period  Weeks    Status  New    Target Date  04/25/17      OT LONG TERM GOAL #2   Title  Pt. will improve elbow extension to assis with UE dressing.    Baseline  Eval: limited elbow extension    Time  6    Period  Weeks    Status  New    Target Date  04/25/17      OT LONG TERM GOAL #3   Title  Pt. will improve right pronation to be able to place UE in a position of comfort.  Baseline  Eval: limited    Time  6    Period  Weeks    Status  New    Target Date  04/25/17      OT LONG TERM GOAL #4   Title  Pt. will improve right grip strength by 10# to be able to hold a glass.    Baseline  Eval: limited    Time  6    Period  Weeks    Status  New    Target Date  04/25/17      OT LONG TERM GOAL #5   Title  Pt. will  will improve 3pt. pinch by by 3# to be able to open packages.    Baseline  Eval: limited    Time  6    Period  Weeks    Status  New    Target Date  04/25/17      Long Term Additional Goals   Additional Long Term Goals  Yes      OT LONG TERM GOAL #6   Title  Pt. will improve Right hand speed, and dexterity by 3 sec. of speed for improved typing speed, and writing legibility.    Baseline  Eval: limited    Time  6    Period  Weeks    Status  New    Target Date  04/25/17      OT LONG TERM GOAL #7   Title  Pt. will be indpendent with HEP for RUE    Baseline  Eval:     Time  6    Period  Weeks    Status  New    Target Date  04/25/17            Plan - 03/22/17 1716    Clinical Impression Statement  Pt. reports having a follow-up appointment with Dr. Maxcine HamBower's on Jan. 15th, 2019. Pt. reports she is now donning the  brace independently using the method reviewed last session. Pt. reported no pain during the session, and reports pain has improved overll. Pt. AROM is improving with elbow flexion, extension, and pronation. More exercises were added to pt.'s HEP for pink theraputty. AROM, and isometric exercises for elbow flexion, extension, supination, pronation, wrist flexion, and extension were reviewed with pt. Pt. tolerated the exercises well without pain.     Occupational performance deficits (Please refer to evaluation for details):  ADL's;IADL's    Rehab Potential  Good    Current Impairments/barriers affecting progress:  multiple comorbidities    OT Frequency  2x / week    OT Duration  6 weeks    OT Treatment/Interventions  Self-care/ADL training;Patient/family education;Therapeutic exercise;Therapeutic activities;Moist Heat;Passive range of motion;Energy conservation    Clinical Decision Making  Several treatment options, min-mod task modification necessary    Consulted and Agree with Plan of Care  Patient       Patient will benefit from skilled therapeutic intervention in order to improve the following deficits and impairments:     Visit Diagnosis: No diagnosis found.    Problem List Patient Active Problem List   Diagnosis Date Noted  . Intractable pain 04/12/2015    Olegario MessierElaine Prairie Stenberg 03/23/2017, 12:54 PM  [facility] [facility] [address] [place], [place], [address] Phone: [phone]   Fax:  [phone]  Name: Ashley JohnsGlenda D Calloway-Nibblett MRN: [id number] Date of Birth: December 07, 1965

## 2017-03-23 NOTE — Telephone Encounter (Signed)
Phone Call Documentation for 03/23/2017   Pt. Phoned the clinic this a.m. Her call was returned at 11:32a.m. Pt. Reported having had a new onset of pain in her right elbow this a.m. while at work, after carrying her coffee. Pt. Reports having had no pain yesterday, no pain following therapy yesterday, or this morning prior to this. Pt. Education was provided about the RUE/elbow restrictions, and repositioning the brace as needed. Pt. Reports also having a call into Dr. Maxcine HamBower's office.

## 2017-03-29 ENCOUNTER — Ambulatory Visit: Payer: 59 | Attending: Orthopedic Surgery | Admitting: Occupational Therapy

## 2017-03-29 ENCOUNTER — Encounter: Payer: Self-pay | Admitting: Occupational Therapy

## 2017-03-29 DIAGNOSIS — M6281 Muscle weakness (generalized): Secondary | ICD-10-CM | POA: Insufficient documentation

## 2017-03-29 DIAGNOSIS — R278 Other lack of coordination: Secondary | ICD-10-CM | POA: Insufficient documentation

## 2017-03-29 NOTE — Therapy (Signed)
[facility] [address] [place], [place], [address] Phone: [phone]   Fax:  [phone]  Occupational Therapy Treatment  Patient Details  Name: Ashley JohnsGlenda D Calloway-Nibblett MRN: [id number] Date of Birth: 08-Nov-1965 No Data Recorded  Encounter Date: 03/29/2017  OT End of Session - 03/29/17 1007    Visit Number  4    Number of Visits  12    Date for OT Re-Evaluation  04/25/17    OT Start Time  0845    OT Stop Time  0915    OT Time Calculation (min)  30 min    Activity Tolerance  Patient tolerated treatment well    Behavior During Therapy  [facility] for tasks assessed/performed       Past Medical History:  Diagnosis Date  . Allergy   . Anemia   . Arthritis    back, left elbow, left hip, left ankle   . Asthma   . Blood transfusion without reported diagnosis   . Diabetes mellitus without complication (HCC)    diet controlled  . GERD (gastroesophageal reflux disease)   . PONV (postoperative nausea and vomiting)   . Sleep apnea     Past Surgical History:  Procedure Laterality Date  . CESAREAN SECTION    . CHOLECYSTECTOMY    . ELBOW FRACTURE SURGERY  1995  . FRACTURE SURGERY     left ankle complete repair, left elbow  . HERNIA REPAIR     incisional  . LAPAROSCOPIC GASTRIC SLEEVE RESECTION  2014  . pelvic cap Left 1995  . TONSILLECTOMY AND ADENOIDECTOMY N/A 12/08/2015   Procedure: TONSILLECTOMY AND ADENOIDECTOMY;  Surgeon: Linus Salmonshapman McQueen, MD;  Location: [facility] ORS;  Service: ENT;  Laterality: N/A;  . TUBAL LIGATION      There were no vitals filed for this visit.  Subjective Assessment - 03/29/17 1006    Subjective   Pt. reports she is waiting for her family to arrive today for her holiday celebration.    Pertinent History  Pt. is a 52 y.o. female who sustained a Right closed fx of the Radial Head. Pt. injured her dominant RUE after getting out of the car, and passing her grandaughter to her daughter.     Currently  in Pain?  No/denies    Pain Score  0-No pain    Pain Orientation  Right    Pain Type  Acute pain    Pain Onset  In the past 7 days      OT TREATMENT    Therapeutic Exercise:  Pt. worked on reviewing the exercises for AROM, and isometric exercises for flexion, extension, forearm supination, pronation, and wrist flexion/ extension. Reviewed theraputty exercises. Pt. was provided with yellow theraputty to alternate with the pink putty. Pt. required verbal cues, and visual demonstration for proper technique. Pt. Education was provided about positioning.      [facility] OT Assessment - 03/29/17 0001      AROM   Overall AROM Comments  Right elbow flexion: 130, extension -16,  pronation 80 degrees.                       OT Education - 03/29/17 1007    Education provided  Yes    Education Details  RUE functioning    Person(s) Educated  Patient    Methods  Explanation    Comprehension  Verbalized understanding          OT Long Term Goals -  03/14/17 1744      OT LONG TERM GOAL #1   Title  Pt. will improve elbow flexion to be independent with self-feeding skills, and hand to mouth patterns.    Baseline  Eval: limited    Time  6    Period  Weeks    Status  New    Target Date  04/25/17      OT LONG TERM GOAL #2   Title  Pt. will improve elbow extension to assis with UE dressing.    Baseline  Eval: limited elbow extension    Time  6    Period  Weeks    Status  New    Target Date  04/25/17      OT LONG TERM GOAL #3   Title  Pt. will improve right pronation to be able to place UE in a position of comfort.    Baseline  Eval: limited    Time  6    Period  Weeks    Status  New    Target Date  04/25/17      OT LONG TERM GOAL #4   Title  Pt. will improve right grip strength by 10# to be able to hold a glass.    Baseline  Eval: limited    Time  6    Period  Weeks    Status  New    Target Date  04/25/17      OT LONG TERM GOAL #5   Title  Pt. will  will  improve 3pt. pinch by by 3# to be able to open packages.    Baseline  Eval: limited    Time  6    Period  Weeks    Status  New    Target Date  04/25/17      Long Term Additional Goals   Additional Long Term Goals  Yes      OT LONG TERM GOAL #6   Title  Pt. will improve Right hand speed, and dexterity by 3 sec. of speed for improved typing speed, and writing legibility.    Baseline  Eval: limited    Time  6    Period  Weeks    Status  New    Target Date  04/25/17      OT LONG TERM GOAL #7   Title  Pt. will be indpendent with HEP for RUE    Baseline  Eval:     Time  6    Period  Weeks    Status  New    Target Date  04/25/17            Plan - 03/29/17 1008    Clinical Impression Statement  Pt, reports she is sore on her left side from doing a lot of reaching at work yesterday. Pt. reports she is only using her left UE at work for reaching. Pt. reports the pain in her right has improved since the morning she carried her coffee with her right  UE at work. Pt. reports she plans to rest her arms on her day off this coming Friday. Pt. reports 1/10 discomfort in the right elbow with flexion, and with LUE use. Pt. is making progress overall., and is tolerating therapy well.    Occupational performance deficits (Please refer to evaluation for details):  ADL's;IADL's    Rehab Potential  Good    Current Impairments/barriers affecting progress:  multiple comorbidities    OT Frequency  2x / week  OT Duration  6 weeks    OT Treatment/Interventions  Self-care/ADL training;Patient/family education;Therapeutic exercise;Therapeutic activities;Moist Heat;Passive range of motion;Energy conservation    Clinical Decision Making  Several treatment options, min-mod task modification necessary    Consulted and Agree with Plan of Care  Patient       Patient will benefit from skilled therapeutic intervention in order to improve the following deficits and impairments:  Pain, Increased edema,  Decreased range of motion, Decreased coordination, Impaired UE functional use, Decreased activity tolerance, Decreased strength, Decreased endurance  Visit Diagnosis: Muscle weakness (generalized)    Problem List Patient Active Problem List   Diagnosis Date Noted  . Intractable pain 04/12/2015    Olegario Messier, MS, OTR/L 03/29/2017, 10:26 AM  [facility] MAIN [facility] SERVICES [address] [place], [place], [address] Phone: [phone]   Fax:  [phone]  Name: Madie Cahn Calloway-Nibblett MRN: [id number] Date of Birth: Dec 14, 1965

## 2017-03-31 ENCOUNTER — Encounter: Payer: Self-pay | Admitting: Occupational Therapy

## 2017-03-31 ENCOUNTER — Ambulatory Visit: Payer: 59 | Admitting: Occupational Therapy

## 2017-03-31 DIAGNOSIS — M6281 Muscle weakness (generalized): Secondary | ICD-10-CM

## 2017-03-31 DIAGNOSIS — R278 Other lack of coordination: Secondary | ICD-10-CM

## 2017-03-31 NOTE — Therapy (Signed)
[facility] [address] [place], [place], [address] Phone: [phone]   Fax:  [phone]  Occupational Therapy Treatment  Patient Details  Name: Ashley [name] MRN: [id number] Date of Birth: 1965/06/02 No Data Recorded  Encounter Date: 03/31/2017  OT End of Session - 03/31/17 1216    Visit Number  5    Number of Visits  12    Date for OT Re-Evaluation  04/25/17    OT Start Time  1055    OT Stop Time  1130    OT Time Calculation (min)  35 min    Activity Tolerance  Patient tolerated treatment well    Behavior During Therapy  [facility] for tasks assessed/performed       Past Medical History:  Diagnosis Date  . Allergy   . Anemia   . Arthritis    back, left elbow, left hip, left ankle   . Asthma   . Blood transfusion without reported diagnosis   . Diabetes mellitus without complication (HCC)    diet controlled  . GERD (gastroesophageal reflux disease)   . PONV (postoperative nausea and vomiting)   . Sleep apnea     Past Surgical History:  Procedure Laterality Date  . CESAREAN SECTION    . CHOLECYSTECTOMY    . ELBOW FRACTURE SURGERY  1995  . FRACTURE SURGERY     left ankle complete repair, left elbow  . HERNIA REPAIR     incisional  . LAPAROSCOPIC GASTRIC SLEEVE RESECTION  2014  . pelvic cap Left 1995  . TONSILLECTOMY AND ADENOIDECTOMY N/A 12/08/2015   Procedure: TONSILLECTOMY AND ADENOIDECTOMY;  Surgeon: Linus Salmonshapman McQueen, MD;  Location: [facility] ORS;  Service: ENT;  Laterality: N/A;  . TUBAL LIGATION      There were no vitals filed for this visit.  Subjective Assessment - 03/31/17 1215    Subjective   Pt. reports no pain.    Pertinent History  Pt. is a 52 y.o. female who sustained a Right closed fx of the Radial Head. Pt. injured her dominant RUE after getting out of the car, and passing her grandaughter to her daughter.     Currently in Pain?  No/denies         [facility] Assessment - 03/31/17  0001      AROM   Overall AROM Comments  Right elbow flexion 134, extension: -12, pornation 82 degrees.      OT TREATMENT    Neuro muscular re-education:  Pt. performed [facility] tasks using the Grooved pegboard. Pt. worked on grasping the grooved pegs from a horizontal position, and moving the pegs to a vertical position in the hand to prepare for placing them in the grooved slot. Pt. Had more difficulty with the translatory movement of the objects within the hand. Pt. Education was provided about [facility] tasks at home.  Therapeutic Exercise:  Pt. was able to demonstrate AROM with elbow flexion, extension, forearm supination, and pronation. Pt. Required verbal cues, and visual demonstration for isometric exercises.                         OT Education - 03/31/17 1216    Education provided  Yes    Education Details  RUE ther. ex., [facility]    Person(s) Educated  Patient    Methods  Explanation    Comprehension  Verbalized understanding          OT Long Term Goals -  03/14/17 1744      OT LONG TERM GOAL #1   Title  Pt. will improve elbow flexion to be independent with self-feeding skills, and hand to mouth patterns.    Baseline  Eval: limited    Time  6    Period  Weeks    Status  New    Target Date  04/25/17      OT LONG TERM GOAL #2   Title  Pt. will improve elbow extension to assis with UE dressing.    Baseline  Eval: limited elbow extension    Time  6    Period  Weeks    Status  New    Target Date  04/25/17      OT LONG TERM GOAL #3   Title  Pt. will improve right pronation to be able to place UE in a position of comfort.    Baseline  Eval: limited    Time  6    Period  Weeks    Status  New    Target Date  04/25/17      OT LONG TERM GOAL #4   Title  Pt. will improve right grip strength by 10# to be able to hold a glass.    Baseline  Eval: limited    Time  6    Period  Weeks    Status  New    Target Date  04/25/17      OT LONG TERM GOAL #5   Title   Pt. will  will improve 3pt. pinch by by 3# to be able to open packages.    Baseline  Eval: limited    Time  6    Period  Weeks    Status  New    Target Date  04/25/17      Long Term Additional Goals   Additional Long Term Goals  Yes      OT LONG TERM GOAL #6   Title  Pt. will improve Right hand speed, and dexterity by 3 sec. of speed for improved typing speed, and writing legibility.    Baseline  Eval: limited    Time  6    Period  Weeks    Status  New    Target Date  04/25/17      OT LONG TERM GOAL #7   Title  Pt. will be indpendent with HEP for RUE    Baseline  Eval:     Time  6    Period  Weeks    Status  New    Target Date  04/25/17            Plan - 03/31/17 1217    Clinical Impression Statement  Pt. reports she plans to rest this weekend, as she had a very busy week at work. Pt. pain has improved. Pt. continues to progress with ROM, and tolerates the exercises well.  pt. conitnues to have restrictions with the RUE. Pt. plans to follow-up with the Dr. Odis Luster on Jan. 15th. Pt. conitnues to work on improving [facility] skills, grasping, and translatory movements of the hand.     Occupational performance deficits (Please refer to evaluation for details):  ADL's;IADL's    Rehab Potential  Good    OT Frequency  2x / week    OT Duration  6 weeks    OT Treatment/Interventions  Self-care/ADL training;Patient/family education;Therapeutic exercise;Therapeutic activities;Moist Heat;Passive range of motion;Energy conservation    Clinical Decision Making  Several treatment options, min-mod  task modification necessary    Consulted and Agree with Plan of Care  Patient       Patient will benefit from skilled therapeutic intervention in order to improve the following deficits and impairments:  Pain, Increased edema, Decreased range of motion, Decreased coordination, Impaired UE functional use, Decreased activity tolerance, Decreased strength, Decreased endurance  Visit Diagnosis: Muscle  weakness (generalized)  Other lack of coordination    Problem List Patient Active Problem List   Diagnosis Date Noted  . Intractable pain 04/12/2015    Olegario Messier, MS, OTR/L 03/31/2017, 12:38 PM  [facility] MAIN [facility] SERVICES [address] [place], [place], [address] Phone: [phone]   Fax:  [phone]  Name: Ashley [name] MRN: [id number] Date of Birth: 1965/09/21

## 2017-04-03 ENCOUNTER — Encounter: Payer: Self-pay | Admitting: Occupational Therapy

## 2017-04-03 ENCOUNTER — Ambulatory Visit: Payer: 59 | Admitting: Occupational Therapy

## 2017-04-03 DIAGNOSIS — M6281 Muscle weakness (generalized): Secondary | ICD-10-CM | POA: Diagnosis not present

## 2017-04-03 DIAGNOSIS — R278 Other lack of coordination: Secondary | ICD-10-CM | POA: Diagnosis not present

## 2017-04-03 NOTE — Therapy (Signed)
[facility] MAIN [facility] SERVICES [address] [place], [place], [address] Phone: [phone]   Fax:  [phone]  Occupational Therapy Treatment  Patient Details  Name: Bama Hanselman Calloway-Nibblett MRN: [id number] Date of Birth: 02-13-1966 No Data Recorded  Encounter Date: 04/03/2017  OT End of Session - 04/03/17 1116    Visit Number  6    Number of Visits  12    Date for OT Re-Evaluation  04/25/17    OT Start Time  1105    OT Stop Time  1130    OT Time Calculation (min)  25 min    Activity Tolerance  Patient tolerated treatment well    Behavior During Therapy  [facility] for tasks assessed/performed       Past Medical History:  Diagnosis Date  . Allergy   . Anemia   . Arthritis    back, left elbow, left hip, left ankle   . Asthma   . Blood transfusion without reported diagnosis   . Diabetes mellitus without complication (HCC)    diet controlled  . GERD (gastroesophageal reflux disease)   . PONV (postoperative nausea and vomiting)   . Sleep apnea     Past Surgical History:  Procedure Laterality Date  . CESAREAN SECTION    . CHOLECYSTECTOMY    . ELBOW FRACTURE SURGERY  1995  . FRACTURE SURGERY     left ankle complete repair, left elbow  . HERNIA REPAIR     incisional  . LAPAROSCOPIC GASTRIC SLEEVE RESECTION  2014  . pelvic cap Left 1995  . TONSILLECTOMY AND ADENOIDECTOMY N/A 12/08/2015   Procedure: TONSILLECTOMY AND ADENOIDECTOMY;  Surgeon: Linus Salmons, MD;  Location: [facility] ORS;  Service: ENT;  Laterality: N/A;  . TUBAL LIGATION      There were no vitals filed for this visit.  Subjective Assessment - 04/03/17 1115    Subjective   Pt. reports no pain today.    Pertinent History  Pt. is a 52 y.o. female who sustained a Right closed fx of the Radial Head. Pt. injured her dominant RUE after getting out of the car, and passing her grandaughter to her daughter.     Currently in Pain?  No/denies         [facility] OT Assessment -  04/03/17 0001      AROM   Overall AROM Comments  Right elbow flexion 140, extension, -12, pronation, 84       OT TREATMENT    Neuro muscular re-education:  Pt. performed [facility] skills training to improve speed and dexterity needed for ADL, IADLtasks and writing. Pt. demonstrated grasping 1 inch sticks,  inch cylindrical collars, and  inch flat washers on the Purdue pegboard. Pt. performed grasping each item with her 2nd digit and thumb, and storing them in the palm. Pt. presented with difficulty storing  inch objects at a time in the palmar aspect of the hand. Pt. has difficulty with translatory movements of hand.                    OT Education - 04/03/17 1115    Education provided  Yes    Education Details  [facility]    Person(s) Educated  Patient    Methods  Explanation    Comprehension  Verbalized understanding          OT Long Term Goals - 03/14/17 1744      OT LONG TERM GOAL #1   Title  Pt. will improve  elbow flexion to be independent with self-feeding skills, and hand to mouth patterns.    Baseline  Eval: limited    Time  6    Period  Weeks    Status  New    Target Date  04/25/17      OT LONG TERM GOAL #2   Title  Pt. will improve elbow extension to assis with UE dressing.    Baseline  Eval: limited elbow extension    Time  6    Period  Weeks    Status  New    Target Date  04/25/17      OT LONG TERM GOAL #3   Title  Pt. will improve right pronation to be able to place UE in a position of comfort.    Baseline  Eval: limited    Time  6    Period  Weeks    Status  New    Target Date  04/25/17      OT LONG TERM GOAL #4   Title  Pt. will improve right grip strength by 10# to be able to hold a glass.    Baseline  Eval: limited    Time  6    Period  Weeks    Status  New    Target Date  04/25/17      OT LONG TERM GOAL #5   Title  Pt. will  will improve 3pt. pinch by by 3# to be able to open packages.    Baseline  Eval: limited    Time  6     Period  Weeks    Status  New    Target Date  04/25/17      Long Term Additional Goals   Additional Long Term Goals  Yes      OT LONG TERM GOAL #6   Title  Pt. will improve Right hand speed, and dexterity by 3 sec. of speed for improved typing speed, and writing legibility.    Baseline  Eval: limited    Time  6    Period  Weeks    Status  New    Target Date  04/25/17      OT LONG TERM GOAL #7   Title  Pt. will be indpendent with HEP for RUE    Baseline  Eval:     Time  6    Period  Weeks    Status  New    Target Date  04/25/17            Plan - 04/03/17 1116    Clinical Impression Statement  Pt. AROM is improving with the elbow, and forearm. Pt. continues to have lifting, and reaching restrictions in place. Pt. continues to work on improving right UE ROM, and [facility] skills for improved engagement in ADLs, and IADLs.    Occupational performance deficits (Please refer to evaluation for details):  ADL's;IADL's    Rehab Potential  Good    Current Impairments/barriers affecting progress:  multiple comorbidities    OT Frequency  2x / week    OT Duration  6 weeks    OT Treatment/Interventions  Self-care/ADL training;Patient/family education;Therapeutic exercise;Therapeutic activities;Moist Heat;Passive range of motion;Energy conservation    Clinical Decision Making  Several treatment options, min-mod task modification necessary    Consulted and Agree with Plan of Care  Patient       Patient will benefit from skilled therapeutic intervention in order to improve the following deficits and impairments:  Pain,  Increased edema, Decreased range of motion, Decreased coordination, Impaired UE functional use, Decreased activity tolerance, Decreased strength, Decreased endurance  Visit Diagnosis: Other lack of coordination    Problem List Patient Active Problem List   Diagnosis Date Noted  . Intractable pain 04/12/2015    Olegario MessierElaine Ellary Casamento, MS, OTR/L 04/03/2017, 11:42 AM  [facility] [address] , [place], [address] Phone: [phone]   Fax:  [phone]  Name: Leland JohnsGlenda D Calloway-Nibblett MRN: [id number] Date of Birth: 01-Jul-1965

## 2017-04-05 ENCOUNTER — Encounter: Payer: Self-pay | Admitting: Occupational Therapy

## 2017-04-05 ENCOUNTER — Ambulatory Visit: Payer: 59 | Admitting: Occupational Therapy

## 2017-04-05 DIAGNOSIS — M6281 Muscle weakness (generalized): Secondary | ICD-10-CM | POA: Diagnosis not present

## 2017-04-05 DIAGNOSIS — R278 Other lack of coordination: Secondary | ICD-10-CM | POA: Diagnosis not present

## 2017-04-05 NOTE — Therapy (Signed)
[facility] [place] [facility] MAIN [facility] SERVICES [address] [place], [place], [address] Phone: [phone]   Fax:  [phone]  Occupational Therapy Treatment  Patient Details  Name: Ashley [name] MRN: [id number] Date of Birth: Aug 09, 1965 No Data Recorded  Encounter Date: 04/05/2017  OT End of Session - 04/05/17 0935    Visit Number  7    Number of Visits  12    Date for OT Re-Evaluation  04/25/17    OT Start Time  0915    OT Stop Time  1000    OT Time Calculation (min)  45 min    Activity Tolerance  Patient tolerated treatment well    Behavior During Therapy  [facility] for tasks assessed/performed       Past Medical History:  Diagnosis Date  . Allergy   . Anemia   . Arthritis    back, left elbow, left hip, left ankle   . Asthma   . Blood transfusion without reported diagnosis   . Diabetes mellitus without complication (HCC)    diet controlled  . GERD (gastroesophageal reflux disease)   . PONV (postoperative nausea and vomiting)   . Sleep apnea     Past Surgical History:  Procedure Laterality Date  . CESAREAN SECTION    . CHOLECYSTECTOMY    . ELBOW FRACTURE SURGERY  1995  . FRACTURE SURGERY     left ankle complete repair, left elbow  . HERNIA REPAIR     incisional  . LAPAROSCOPIC GASTRIC SLEEVE RESECTION  2014  . pelvic cap Left 1995  . TONSILLECTOMY AND ADENOIDECTOMY N/A 12/08/2015   Procedure: TONSILLECTOMY AND ADENOIDECTOMY;  Surgeon: Linus Salmons, MD;  Location: [facility] ORS;  Service: ENT;  Laterality: N/A;  . TUBAL LIGATION      There were no vitals filed for this visit.  Subjective Assessment - 04/05/17 0933    Subjective   Pt. reports no pain today    Pertinent History  Pt. is a 52 y.o. female who sustained a Right closed fx of the Radial Head. Pt. injured her dominant RUE after getting out of the car, and passing her grandaughter to her daughter.     Currently in Pain?  No/denies         [facility] OT Assessment -  04/05/17 0930      AROM   Overall AROM Comments  right elbow flexion: 138, extension -12, pronation: 86      OT TREATMENT    Neuro muscular re-education:  Pt. performed [facility] skills training to improve speed and dexterity needed for ADL tasks and writing. Pt. demonstrated grasping 1 inch sticks,  inch cylindrical collars, and  inch flat washers on the Purdue pegboard. Pt. performed grasping each item with her 2nd digit and thumb, and storing them in the palm. Pt. presented with difficulty storing  inch objects at a time in the palmar aspect of the hand. Pt. Is improving with translatory movements of the hand.  Therapeutic Exercise:  Pt. Worked on pinch strengthening in the right hand for lateral, 3pt., and 2pt. pinch using yellow, and red resistive clips. Pt. worked on placing the clips at a horizontal angle. Tactile and verbal cues were required for eliciting the desired movement. Pt. Performed AROM/isometric exercises for elbow flexion, extension, forearm supination, pronation.                       OT Education - 04/05/17 [phone]    Education provided  Yes  Education Details  FMC, ROM, isometric exercises    Person(s) Educated  Patient    Methods  Explanation    Comprehension  Verbalized understanding          OT Long Term Goals - 03/14/17 1744      OT LONG TERM GOAL #1   Title  Pt. will improve elbow flexion to be independent with self-feeding skills, and hand to mouth patterns.    Baseline  Eval: limited    Time  6    Period  Weeks    Status  New    Target Date  04/25/17      OT LONG TERM GOAL #2   Title  Pt. will improve elbow extension to assis with UE dressing.    Baseline  Eval: limited elbow extension    Time  6    Period  Weeks    Status  New    Target Date  04/25/17      OT LONG TERM GOAL #3   Title  Pt. will improve right pronation to be able to place UE in a position of comfort.    Baseline  Eval: limited    Time  6    Period  Weeks     Status  New    Target Date  04/25/17      OT LONG TERM GOAL #4   Title  Pt. will improve right grip strength by 10# to be able to hold a glass.    Baseline  Eval: limited    Time  6    Period  Weeks    Status  New    Target Date  04/25/17      OT LONG TERM GOAL #5   Title  Pt. will  will improve 3pt. pinch by by 3# to be able to open packages.    Baseline  Eval: limited    Time  6    Period  Weeks    Status  New    Target Date  04/25/17      Long Term Additional Goals   Additional Long Term Goals  Yes      OT LONG TERM GOAL #6   Title  Pt. will improve Right hand speed, and dexterity by 3 sec. of speed for improved typing speed, and writing legibility.    Baseline  Eval: limited    Time  6    Period  Weeks    Status  New    Target Date  04/25/17      OT LONG TERM GOAL #7   Title  Pt. will be indpendent with HEP for RUE    Baseline  Eval:     Time  6    Period  Weeks    Status  New    Target Date  04/25/17            Plan - 04/05/17 1610    Clinical Impression Statement  Pt. reports her Left side is sore this morning secondary to having a very busy day at work yesterday. Pt. continues to have lifting, and reaching restrictions. Pain has improved. Pt. reports no pain today. Pt. requires verbal cues, and visual demonstration HEP. Pt. cotitnues to work on improving right elbow, and forearm ROM., and [facility] skills, and translatory movements of the hand.  Pt. sees Dr. Odis Luster next week.     Occupational performance deficits (Please refer to evaluation for details):  ADL's;IADL's    Rehab Potential  Good  OT Frequency  2x / week    OT Duration  6 weeks    OT Treatment/Interventions  Self-care/ADL training;Patient/family education;Therapeutic exercise;Therapeutic activities;Moist Heat;Passive range of motion;Energy conservation    Clinical Decision Making  Several treatment options, min-mod task modification necessary    Consulted and Agree with Plan of Care  Patient        Patient will benefit from skilled therapeutic intervention in order to improve the following deficits and impairments:  Pain, Increased edema, Decreased range of motion, Decreased coordination, Impaired UE functional use, Decreased activity tolerance, Decreased strength, Decreased endurance  Visit Diagnosis: Muscle weakness (generalized)  Other lack of coordination    Problem List Patient Active Problem List   Diagnosis Date Noted  . Intractable pain 04/12/2015    Olegario MessierElaine Quill Grinder 04/05/2017, 10:04 AM  [facility] [address] [place], [place], [address] Phone: [phone]   Fax:  [phone]  Name: Ashley [name] MRN: [id number] Date of Birth: [birth year]

## 2017-04-10 ENCOUNTER — Encounter: Payer: Self-pay | Admitting: Occupational Therapy

## 2017-04-10 ENCOUNTER — Ambulatory Visit: Payer: 59 | Admitting: Occupational Therapy

## 2017-04-10 DIAGNOSIS — M6281 Muscle weakness (generalized): Secondary | ICD-10-CM | POA: Diagnosis not present

## 2017-04-10 DIAGNOSIS — R278 Other lack of coordination: Secondary | ICD-10-CM | POA: Diagnosis not present

## 2017-04-10 NOTE — Therapy (Signed)
[facility] [facility] MAIN [facility] SERVICES [address] [place], [place], [address] Phone: [phone]   Fax:  [phone]  Occupational Therapy Treatment  Patient Details  Name: Ashley [name] MRN: [id number] Date of Birth: [birth year] Referring Provider: Dr. Derryl Harbor   Encounter Date: 04/10/2017  OT End of Session - 04/10/17 0924    Visit Number  8    Number of Visits  12    Date for OT Re-Evaluation  04/25/17    OT Start Time  0857    OT Stop Time  0930    OT Time Calculation (min)  33 min    Activity Tolerance  Patient tolerated treatment well    Behavior During Therapy  [facility] for tasks assessed/performed       Past Medical History:  Diagnosis Date  . Allergy   . Anemia   . Arthritis    back, left elbow, left hip, left ankle   . Asthma   . Blood transfusion without reported diagnosis   . Diabetes mellitus without complication (HCC)    diet controlled  . GERD (gastroesophageal reflux disease)   . PONV (postoperative nausea and vomiting)   . Sleep apnea     Past Surgical History:  Procedure Laterality Date  . CESAREAN SECTION    . CHOLECYSTECTOMY    . ELBOW FRACTURE SURGERY  1995  . FRACTURE SURGERY     left ankle complete repair, left elbow  . HERNIA REPAIR     incisional  . LAPAROSCOPIC GASTRIC SLEEVE RESECTION  2014  . pelvic cap Left 1995  . TONSILLECTOMY AND ADENOIDECTOMY N/A 12/08/2015   Procedure: TONSILLECTOMY AND ADENOIDECTOMY;  Surgeon: Linus Salmons, MD;  Location: [facility] ORS;  Service: ENT;  Laterality: N/A;  . TUBAL LIGATION      There were no vitals filed for this visit.  Subjective Assessment - 04/10/17 0922    Subjective   Pt. reports no pain today.    Pertinent History  Pt. is a 52 y.o. female who sustained a Right closed fx of the Radial Head. Pt. injured her dominant RUE after getting out of the car, and passing her grandaughter to her daughter.     Currently in Pain?  No/denies         [facility] OT  Assessment - 04/10/17 0908      Coordination   Right 9 Hole Peg Test  28 sec.       AROM   Overall AROM Comments  Right elbow flexion 138, degrees, extension -12, pronation 90.      Hand Function   Right Hand Grip (lbs)  50#    Right Hand Lateral Pinch  15 lbs    Right Hand 3 Point Pinch  14 lbs       OT TREATMENT    Neuro muscular re-education:  Pt. Performed right [facility] skills training to improve speed and dexterity needed for ADL tasks and writing. Pt. demonstrated grasping 1 inch sticks,  inch cylindrical collars, and  inch flat washers on the Purdue pegboard. Pt. performed grasping each item with her 2nd digit and thumb, and storing them in the palm. Pt. presented with difficulty storing  inch objects at a time in the palmar aspect of the hand. Pt. worked on removing them while alternating thumb opposition to the tip of her 2nd through 5th digits. Measurements with obtained for [facility] speed, dexterity.  Therapeutic Exercise:  Measurements were obtained for elbow, and forearm ROM, grip, and pinch strength.  OT Education - 04/10/17 [phone]    Education provided  Yes    Education Details  FMC, ROM    Person(s) Educated  Patient    Methods  Explanation    Comprehension  Verbalized understanding          OT Long Term Goals - 03/14/17 1744      OT LONG TERM GOAL #1   Title  Pt. will improve elbow flexion to be independent with self-feeding skills, and hand to mouth patterns.    Baseline  Eval: limited    Time  6    Period  Weeks    Status  New    Target Date  04/25/17      OT LONG TERM GOAL #2   Title  Pt. will improve elbow extension to assis with UE dressing.    Baseline  Eval: limited elbow extension    Time  6    Period  Weeks    Status  New    Target Date  04/25/17      OT LONG TERM GOAL #3   Title  Pt. will improve right pronation to be able to place UE in a position of comfort.    Baseline  Eval: limited    Time  6     Period  Weeks    Status  New    Target Date  04/25/17      OT LONG TERM GOAL #4   Title  Pt. will improve right grip strength by 10# to be able to hold a glass.    Baseline  Eval: limited    Time  6    Period  Weeks    Status  New    Target Date  04/25/17      OT LONG TERM GOAL #5   Title  Pt. will  will improve 3pt. pinch by by 3# to be able to open packages.    Baseline  Eval: limited    Time  6    Period  Weeks    Status  New    Target Date  04/25/17      Long Term Additional Goals   Additional Long Term Goals  Yes      OT LONG TERM GOAL #6   Title  Pt. will improve Right hand speed, and dexterity by 3 sec. of speed for improved typing speed, and writing legibility.    Baseline  Eval: limited    Time  6    Period  Weeks    Status  New    Target Date  04/25/17      OT LONG TERM GOAL #7   Title  Pt. will be indpendent with HEP for RUE    Baseline  Eval:     Time  6    Period  Weeks    Status  New    Target Date  04/25/17            Plan - 04/10/17 9604    Clinical Impression Statement  Pt. has an Orthopedic follow-up appointment tomorrow, and is hoping to have restrictions lifted. Pt. is making progress overall with RUE ROM, and [facility] skills. Pt. ROM for elbow flexion, extension, pronation, FMC, and translatory movements of the hand have improved. Pt. pain level has improved. Pt. is able to demonstrate independendence with HEP for ROM exercises, theraputty exercises, and [facility] tasks. Pt. is aware of the RUE restrictions.    Occupational performance deficits (Please refer to evaluation  for details):  ADL's;IADL's    Rehab Potential  Good    Current Impairments/barriers affecting progress:  multiple comorbidities    OT Frequency  2x / week    OT Duration  6 weeks    OT Treatment/Interventions  Self-care/ADL training;Patient/family education;Therapeutic exercise;Therapeutic activities;Moist Heat;Passive range of motion;Energy conservation    Clinical Decision Making   Several treatment options, min-mod task modification necessary    Consulted and Agree with Plan of Care  Patient       Patient will benefit from skilled therapeutic intervention in order to improve the following deficits and impairments:  Pain, Increased edema, Decreased range of motion, Decreased coordination, Impaired UE functional use, Decreased activity tolerance, Decreased strength, Decreased endurance  Visit Diagnosis: Muscle weakness (generalized)    Problem List Patient Active Problem List   Diagnosis Date Noted  . Intractable pain 04/12/2015    Olegario MessierElaine Janari Yamada, MS, OTR/L 04/10/2017, 10:00 AM  [facility] [address] [place], [place], [address] Phone: [phone]   Fax:  [phone]  Name: Leland JohnsGlenda D [name] MRN: [id number] Date of Birth: 26-Oct-1965

## 2017-04-11 DIAGNOSIS — S52121D Displaced fracture of head of right radius, subsequent encounter for closed fracture with routine healing: Secondary | ICD-10-CM | POA: Diagnosis not present

## 2017-04-26 ENCOUNTER — Ambulatory Visit: Payer: 59 | Admitting: Occupational Therapy

## 2017-04-27 ENCOUNTER — Ambulatory Visit: Payer: 59 | Admitting: Occupational Therapy

## 2017-05-08 DIAGNOSIS — E559 Vitamin D deficiency, unspecified: Secondary | ICD-10-CM | POA: Diagnosis not present

## 2017-05-08 DIAGNOSIS — D649 Anemia, unspecified: Secondary | ICD-10-CM | POA: Diagnosis not present

## 2017-05-08 DIAGNOSIS — E213 Hyperparathyroidism, unspecified: Secondary | ICD-10-CM | POA: Diagnosis not present

## 2017-05-08 DIAGNOSIS — E119 Type 2 diabetes mellitus without complications: Secondary | ICD-10-CM | POA: Diagnosis not present

## 2017-05-16 DIAGNOSIS — E213 Hyperparathyroidism, unspecified: Secondary | ICD-10-CM | POA: Diagnosis not present

## 2017-05-16 DIAGNOSIS — Z0000 Encounter for general adult medical examination without abnormal findings: Secondary | ICD-10-CM | POA: Diagnosis not present

## 2017-05-16 DIAGNOSIS — K219 Gastro-esophageal reflux disease without esophagitis: Secondary | ICD-10-CM | POA: Diagnosis not present

## 2017-05-16 DIAGNOSIS — E119 Type 2 diabetes mellitus without complications: Secondary | ICD-10-CM | POA: Diagnosis not present

## 2017-06-08 DIAGNOSIS — S52121D Displaced fracture of head of right radius, subsequent encounter for closed fracture with routine healing: Secondary | ICD-10-CM | POA: Diagnosis not present

## 2017-06-19 DIAGNOSIS — J4520 Mild intermittent asthma, uncomplicated: Secondary | ICD-10-CM | POA: Diagnosis not present

## 2017-06-19 DIAGNOSIS — G4733 Obstructive sleep apnea (adult) (pediatric): Secondary | ICD-10-CM | POA: Diagnosis not present

## 2017-06-19 DIAGNOSIS — E119 Type 2 diabetes mellitus without complications: Secondary | ICD-10-CM | POA: Diagnosis not present

## 2017-09-13 DIAGNOSIS — D649 Anemia, unspecified: Secondary | ICD-10-CM | POA: Diagnosis not present

## 2017-09-13 DIAGNOSIS — E213 Hyperparathyroidism, unspecified: Secondary | ICD-10-CM | POA: Diagnosis not present

## 2017-09-13 DIAGNOSIS — E119 Type 2 diabetes mellitus without complications: Secondary | ICD-10-CM | POA: Diagnosis not present

## 2017-09-20 DIAGNOSIS — G4733 Obstructive sleep apnea (adult) (pediatric): Secondary | ICD-10-CM | POA: Diagnosis not present

## 2017-09-20 DIAGNOSIS — E119 Type 2 diabetes mellitus without complications: Secondary | ICD-10-CM | POA: Diagnosis not present

## 2017-09-20 DIAGNOSIS — E213 Hyperparathyroidism, unspecified: Secondary | ICD-10-CM | POA: Diagnosis not present

## 2017-09-20 DIAGNOSIS — J4520 Mild intermittent asthma, uncomplicated: Secondary | ICD-10-CM | POA: Diagnosis not present

## 2017-10-09 DIAGNOSIS — H538 Other visual disturbances: Secondary | ICD-10-CM | POA: Diagnosis not present

## 2017-11-20 DIAGNOSIS — H538 Other visual disturbances: Secondary | ICD-10-CM | POA: Diagnosis not present

## 2017-11-20 DIAGNOSIS — H04123 Dry eye syndrome of bilateral lacrimal glands: Secondary | ICD-10-CM | POA: Diagnosis not present

## 2017-12-06 DIAGNOSIS — H04123 Dry eye syndrome of bilateral lacrimal glands: Secondary | ICD-10-CM | POA: Diagnosis not present

## 2018-01-18 ENCOUNTER — Encounter: Payer: Self-pay | Admitting: Occupational Therapy

## 2018-01-18 DIAGNOSIS — M6281 Muscle weakness (generalized): Secondary | ICD-10-CM

## 2018-01-18 NOTE — Therapy (Unsigned)
[place] [facility] MAIN [facility] SERVICES [address] [place], [place], [address] Phone: [phone]   Fax:  [phone]  January 18, 2018      Occupational Therapy Discharge Summary   Patient: NICK ARMEL MRN: [id number] Date of Birth: Nov 20, 1965  Diagnosis: Muscle weakness (generalized)  No data recorded  The above patient had been seen in Occupational Therapy 8 scheduled scheduled treatment sessions.  The treatment consisted of ROM, there. ex., ADL/IADL training, and pt. education. The patient is: improved  Subjective: Pt. had a scheduled follow-up appointment with her Orthopedic Physician following the last visit on 04/10/2017.  [facility] OT Assessment - 04/10/17 0908            Coordination   Right 9 Hole Peg Test  28 sec.         AROM   Overall AROM Comments  Right elbow flexion 138, degrees, extension -12, pronation 90.        Hand Function   Right Hand Grip (lbs)  50#    Right Hand Lateral Pinch  15 lbs    Right Hand 3 Point Pinch  14 lbs       OT Long Term Goals - 03/14/17 1744      OT LONG TERM GOAL #1   Title  Pt. will improve elbow flexion to be independent with self-feeding skills, and hand to mouth patterns.    Baseline  Eval: limited    Time  6    Period  Weeks    Status  New    Target Date  04/25/17      OT LONG TERM GOAL #2   Title  Pt. will improve elbow extension to assis with UE dressing.    Baseline  Eval: limited elbow extension    Time  6    Period  Weeks    Status  New    Target Date  04/25/17      OT LONG TERM GOAL #3   Title  Pt. will improve right pronation to be able to place UE in a position of comfort.    Baseline  Eval: limited    Time  6    Period  Weeks    Status  New    Target Date  04/25/17      OT LONG TERM GOAL #4   Title  Pt. will improve right grip strength by 10# to be able to hold a glass.    Baseline  Eval: limited    Time  6    Period  Weeks    Status   New    Target Date  04/25/17      OT LONG TERM GOAL #5   Title  Pt. will  will improve 3pt. pinch by by 3# to be able to open packages.    Baseline  Eval: limited    Time  6    Period  Weeks    Status  New    Target Date  04/25/17      Long Term Additional Goals   Additional Long Term Goals  Yes      OT LONG TERM GOAL #6   Title  Pt. will improve Right hand speed, and dexterity by 3 sec. of speed for improved typing speed, and writing legibility.    Baseline  Eval: limited    Time  6    Period  Weeks    Status  New    Target Date  04/25/17  OT LONG TERM GOAL #7   Title  Pt. will be indpendent with HEP for RUE    Baseline  Discharge: Independent with HEPs.    Time  6    Period  Weeks    Status  New    Target Date  04/25/17       Olegario Messier, MS, OTR/L    [facility] MAIN [facility] SERVICES [address] [place], [place], [address] Phone: [phone]   Fax:  [phone]  Patient: Oreoluwa Gilmer Calloway-Nibblett MRN: [id number] Date of Birth: March 08, 1966

## 2018-03-12 DIAGNOSIS — H04123 Dry eye syndrome of bilateral lacrimal glands: Secondary | ICD-10-CM | POA: Diagnosis not present

## 2018-06-20 ENCOUNTER — Ambulatory Visit (INDEPENDENT_AMBULATORY_CARE_PROVIDER_SITE_OTHER): Payer: No Typology Code available for payment source

## 2018-06-20 ENCOUNTER — Ambulatory Visit: Payer: Self-pay | Admitting: General Surgery

## 2018-06-20 ENCOUNTER — Other Ambulatory Visit: Payer: Self-pay | Admitting: General Surgery

## 2018-06-20 ENCOUNTER — Other Ambulatory Visit: Payer: Self-pay

## 2018-06-20 ENCOUNTER — Ambulatory Visit: Payer: Self-pay

## 2018-06-20 ENCOUNTER — Ambulatory Visit (INDEPENDENT_AMBULATORY_CARE_PROVIDER_SITE_OTHER): Payer: No Typology Code available for payment source | Admitting: General Surgery

## 2018-06-20 ENCOUNTER — Encounter: Payer: Self-pay | Admitting: General Surgery

## 2018-06-20 VITALS — BP 118/81 | HR 86 | Temp 97.3°F | Ht 67.0 in | Wt 252.0 lb

## 2018-06-20 DIAGNOSIS — E041 Nontoxic single thyroid nodule: Secondary | ICD-10-CM | POA: Diagnosis not present

## 2018-06-20 DIAGNOSIS — E213 Hyperparathyroidism, unspecified: Secondary | ICD-10-CM | POA: Insufficient documentation

## 2018-06-20 HISTORY — DX: Nontoxic single thyroid nodule: E04.1

## 2018-06-20 NOTE — Progress Notes (Signed)
Patient ID: Ashley [name], female   DOB: June 21, 1965, 53 y.o.   MRN: [id number]  Chief Complaint  Patient presents with  . New Patient (Initial Visit)     new pt ref Dr.Klein hyperparathyroidism     HPI Ashley [name] is a 53 y.o. female.   She was referred by Dr. Graciela Husbands for further evaluation and management of primary hyperparathyroidism.  She states that Dr. Graciela Husbands has been following her calcium level for years.  Over time it has crept higher and higher.  Additional biochemical evaluation was performed and was most consistent with primary hyperparathyroidism.  She was seen by Dr. Selena Batten, at [facility], however her insurance will not cover any procedures performed out of network.  Dr. Selena Batten performed an ultrasound to try and localize the abnormal parathyroid gland.  His notes state that he did not see one but that he did see a thyroid nodule that required biopsy.  A fine-needle aspiration biopsy was performed, however it was nondiagnostic due to insufficient cells.  As I am now within the patient's network, she has been referred to me, as an Architect, to evaluate and manage her disease.  Ashley [name] states that her primary symptom is brain fog and poor memory.  She also relates episodes of vertigo and has had been evaluated for vestibular issues.  She endorses constipation and gastroesophageal reflux disease.  She reports long bone pain and fatigue.  She reports having frequent mood swings as well as poor sleep.  She denies any history of pathologic fracture, nephrolithiasis, or pancreatitis.  Of note, she has had bariatric surgery, but this was a sleeve, rather than a bypass.  She states that no family members have any history of hyperparathyroidism.  No history of jaw tumors or of any known heritable endocrinopathy.  She is not taking in the calcium supplements, nor she on any medications known to increase the serum calcium.  She does have slightly low vitamin D levels,  however they are not so low as to suggest a secondary source of elevated intact PTH.  She is on oral vitamin D supplements.   Past Medical History:  Diagnosis Date  . Allergy   . Anemia   . Arthritis    back, left elbow, left hip, left ankle   . Asthma   . Blood transfusion without reported diagnosis   . Diabetes mellitus without complication (HCC)    diet controlled  . GERD (gastroesophageal reflux disease)   . PONV (postoperative nausea and vomiting)   . Sleep apnea     Past Surgical History:  Procedure Laterality Date  . CESAREAN SECTION    . CHOLECYSTECTOMY    . ELBOW FRACTURE SURGERY  1995  . FRACTURE SURGERY     left ankle complete repair, left elbow  . HERNIA REPAIR     incisional  . LAPAROSCOPIC GASTRIC SLEEVE RESECTION  2014  . pelvic cap Left 1995  . TONSILLECTOMY AND ADENOIDECTOMY N/A 12/08/2015   Procedure: TONSILLECTOMY AND ADENOIDECTOMY;  Surgeon: Linus Salmons, MD;  Location: [facility] ORS;  Service: ENT;  Laterality: N/A;  . TUBAL LIGATION      Family History  Problem Relation Age of Onset  . Diabetes Mother   . Hypertension Mother   . Cancer Mother   . Asthma Mother   . Diabetes Father   . Hyperlipidemia Father   . Hypertension Father   . Kidney disease Father   . Heart disease Father   . Diabetes Maternal Aunt   .  Diabetes Maternal Uncle   . Diabetes Paternal Aunt   . Diabetes Paternal Uncle   . Breast cancer Maternal Grandmother 8    Social History Social History   Tobacco Use  . Smoking status: Never Smoker  . Smokeless tobacco: Never Used  Substance Use Topics  . Alcohol use: No    Alcohol/week: 0.0 standard drinks  . Drug use: No    Allergies  Allergen Reactions  . Bee Venom Anaphylaxis  . Influenza Vac Split [Flu Virus Vaccine] Anaphylaxis  . Metformin Diarrhea  . Orange Fruit [Citrus] Itching  . Dilaudid [Hydromorphone] Itching  . Penicillins Nausea And Vomiting, Rash and Other (See Comments)    Has patient had a PCN  reaction causing immediate rash, facial/tongue/throat swelling, SOB or lightheadedness with hypotension: Yes Has patient had a PCN reaction causing severe rash involving mucus membranes or skin necrosis: No Has patient had a PCN reaction that required hospitalization No Has patient had a PCN reaction occurring within the last 10 years: No If all of the above answers are "NO", then may proceed with Cephalosporin use.     Current Outpatient Medications  Medication Sig Dispense Refill  . albuterol (PROVENTIL HFA;VENTOLIN HFA) 108 (90 BASE) MCG/ACT inhaler Inhale 2 puffs into the lungs every 6 (six) hours as needed for wheezing or shortness of breath.    . Biotin 2500 MCG CAPS Take 5,000 mcg by mouth daily.    . budesonide-formoterol (SYMBICORT) 80-4.5 MCG/ACT inhaler Inhale 2 puffs into the lungs daily as needed (shortness of breath).     . busPIRone (BUSPAR) 5 MG tablet Take 5 mg by mouth 3 (three) times daily.    . Cholecalciferol 1000 UNITS tablet Take 5,000 Units by mouth daily.    Marland Kitchen Dexlansoprazole (DEXILANT PO) Take 1 capsule by mouth.    . EPINEPHrine (EPIPEN 2-PAK) 0.3 mg/0.3 mL IJ SOAJ injection Inject 0.3 mg into the muscle as needed (for anaphylaxis).    Marland Kitchen escitalopram (LEXAPRO) 10 MG tablet Take 10 mg by mouth daily.    . ferrous sulfate 325 (65 FE) MG tablet Take 650 mg by mouth 2 (two) times daily with a meal.    . fluticasone (FLONASE) 50 MCG/ACT nasal spray Place into both nostrils daily.    Marland Kitchen Lifitegrast (XIIDRA) 5 % SOLN Apply to eye.    . meclizine (ANTIVERT) 25 MG tablet Take 25 mg by mouth 3 (three) times daily as needed for dizziness.    . Multiple Vitamins-Minerals (MULTIVITAMIN WITH MINERALS) tablet Take 1 tablet by mouth daily.    Marland Kitchen omeprazole (PRILOSEC) 40 MG capsule Take 40 mg by mouth daily.    . phentermine 37.5 MG capsule Take 37.5 mg by mouth every morning.    . polycarbophil (FIBER LAXATIVE) 625 MG tablet Take 625 mg by mouth daily.    . prochlorperazine  (COMPAZINE) 10 MG tablet Take 10 mg by mouth every 6 (six) hours as needed for nausea or vomiting.    Marland Kitchen tiZANidine (ZANAFLEX) 4 MG tablet Take 4 mg by mouth every 6 (six) hours as needed for muscle spasms.    . vitamin E (VITAMIN E) 400 UNIT capsule Take 400 Units by mouth daily.     No current facility-administered medications for this visit.     Review of Systems Review of Systems  All other systems reviewed and are negative. or as discussed in the HPI.  Blood pressure 118/81, pulse 86, temperature (!) 97.3 F (36.3 C), temperature source Temporal, height 5\' 7"  (1.702  m), weight 252 lb (114.3 kg), SpO2 95 %.  Physical Exam Physical Exam Vitals signs reviewed.  Constitutional:      General: She is not in acute distress.    Appearance: She is obese.  HENT:     Head: Normocephalic and atraumatic.     Mouth/Throat:     Mouth: Mucous membranes are moist.     Pharynx: Oropharynx is clear. No oropharyngeal exudate.  Eyes:     General: No scleral icterus.       Right eye: No discharge.        Left eye: No discharge.     Pupils: Pupils are equal, round, and reactive to light.  Neck:     Musculoskeletal: Normal range of motion.     Comments: No palpable thyroid nodules or masses.  Cardiovascular:     Rate and Rhythm: Normal rate and regular rhythm.     Pulses: Normal pulses.  Pulmonary:     Effort: Pulmonary effort is normal.     Breath sounds: Normal breath sounds.  Abdominal:     General: Bowel sounds are normal.     Palpations: Abdomen is soft.     Tenderness: There is no abdominal tenderness.  Genitourinary:    Comments: Deferred. Musculoskeletal: Normal range of motion.     Right lower leg: Edema present.     Left lower leg: Edema present.     Comments: Non-pitting.  Lymphadenopathy:     Cervical: No cervical adenopathy.  Skin:    General: Skin is warm and dry.     Findings: No lesion or rash.  Neurological:     General: No focal deficit present.     Mental  Status: She is alert.  Psychiatric:        Behavior: Behavior normal.     Data Reviewed I reviewed the laboratory results obtained within both the [facility] health system and the [facility] healthcare system.  These show a pattern of persistently elevated serum calcium levels with normal albumin levels.  Creatinine and GFR are normal.  TSH is normal.  As stated previously, 25 hydroxy vitamin D is slightly low at 24.2.  Intact PTH is elevated on several occasions, with the most recent being 133 obtained on 05 June 2018.  This is accompanied by a calcium level of 11.1 obtained on the same day.  Phosphorus levels are normal.  I also reviewed Dr. Peyton Najjar Kim's notes including his description of his thyroid ultrasound and biopsy.  Unfortunately, I do not have actual images to review.  Fine-needle aspiration biopsy results was also reviewed that stated the specimen was insufficient for diagnosis.  The report of the nuclear medicine parathyroid scan was reviewed.  This was negative for an obvious parathyroid adenoma.  Again, no actual images are available for my review.  Assessment Alajia Calloway [name] is a 53 year old woman who has biochemical evidence of primary hyperparathyroidism.  She is also symptomatic.  No localization studies have identified an abnormal parathyroid gland.  She also has a thyroid nodule that Dr. Selena Batten felt was suspicious.  Unfortunately, his biopsy was nondiagnostic.  Plan I performed a bedside ultrasound in clinic today.  There is a possible right inferior parathyroid adenoma, versus a descended superior gland.  There is also a hypoechoic, wedge-shaped structure at the left inferior pole that may represent either a thyroid abnormality versus a parathyroid adenoma.  In addition, I visualized the nodule of concern commented upon by Dr. Selena Batten.  I agree that it warrants  biopsy.  This was performed in clinic today.  I do think that Ms. Callaway [name] would benefit from  parathyroidectomy.  Depending upon the results of my thyroid biopsy, she may also require thyroid surgery.  I have ordered a 4-dimensional parathyroid CT scan to try and help better localize any abnormal parathyroid tissue, keeping in mind that it may not be within the neck.  I will await the results of my thyroid biopsy as well as the imaging study.  We will contact the patient when I have these results and make further plans regarding surgical intervention.    Duanne GuessJennifer Sanari Offner 06/20/2018, 4:47 PM

## 2018-06-20 NOTE — Patient Instructions (Addendum)
We will call you once we get your parathyroid scan scheduled.

## 2018-06-21 ENCOUNTER — Telehealth: Payer: Self-pay

## 2018-06-21 NOTE — Telephone Encounter (Signed)
The patient is scheduled for a 4D CT Parathyroid at [facility] on 06/26/18 at 10:00 am. She will arrive there by 9:45 am and have nothing to eat and liquids only for 4 hours prior. The patient is aware of date, time, and instructions.

## 2018-06-25 ENCOUNTER — Ambulatory Visit: Payer: Self-pay | Admitting: General Surgery

## 2018-06-26 ENCOUNTER — Other Ambulatory Visit: Payer: Self-pay

## 2018-06-26 ENCOUNTER — Ambulatory Visit: Admission: RE | Admit: 2018-06-26 | Payer: No Typology Code available for payment source | Source: Ambulatory Visit

## 2018-06-26 ENCOUNTER — Ambulatory Visit
Admission: RE | Admit: 2018-06-26 | Discharge: 2018-06-26 | Disposition: A | Discharge status: Home / Self Care | Payer: No Typology Code available for payment source | Source: Ambulatory Visit | Attending: General Surgery | Admitting: General Surgery

## 2018-06-26 DIAGNOSIS — E213 Hyperparathyroidism, unspecified: Secondary | ICD-10-CM | POA: Diagnosis present

## 2018-06-26 MED ORDER — IOHEXOL 300 MG/ML  SOLN
75.0000 mL | Freq: Once | INTRAMUSCULAR | Status: AC | PRN
  Administered 2018-06-26: 75 mL via INTRAVENOUS

## 2018-06-27 ENCOUNTER — Other Ambulatory Visit: Payer: Self-pay | Admitting: General Surgery

## 2018-06-27 ENCOUNTER — Encounter: Payer: Self-pay | Admitting: *Deleted

## 2018-06-27 ENCOUNTER — Ambulatory Visit
Admission: RE | Admit: 2018-06-27 | Discharge: 2018-06-27 | Disposition: A | Discharge status: Home / Self Care | Payer: Self-pay | Source: Ambulatory Visit | Attending: General Surgery | Admitting: General Surgery

## 2018-06-27 DIAGNOSIS — E041 Nontoxic single thyroid nodule: Secondary | ICD-10-CM

## 2018-06-27 NOTE — Progress Notes (Signed)
Ashley [name] from [facility] called on this patient. Patient had a CT scan done 06-26-18. Ashley [name] said under the impression the radiologist stated it would be beneficial for a nuc med parathyroid scan and he didn't realize that the patient already had this completed at [facility] on 05-29-18. This radologist works in [place] and will not be working again until Friday, 06-29-18 at 10 pm. They have the images for him to review. He will be making an addendum once he reviews the images.   Dr. Lady Gary notified of the above.

## 2018-07-02 ENCOUNTER — Encounter: Payer: Self-pay | Admitting: General Surgery

## 2018-07-11 ENCOUNTER — Encounter (HOSPITAL_COMMUNITY): Payer: Self-pay

## 2018-07-13 ENCOUNTER — Encounter: Payer: Self-pay | Admitting: General Surgery

## 2018-07-24 ENCOUNTER — Other Ambulatory Visit: Payer: Self-pay | Admitting: Neurology

## 2018-07-24 DIAGNOSIS — R42 Dizziness and giddiness: Secondary | ICD-10-CM

## 2018-07-24 DIAGNOSIS — R4189 Other symptoms and signs involving cognitive functions and awareness: Secondary | ICD-10-CM

## 2018-07-31 ENCOUNTER — Ambulatory Visit
Admission: RE | Admit: 2018-07-31 | Discharge: 2018-07-31 | Disposition: A | Discharge status: Home / Self Care | Payer: No Typology Code available for payment source | Source: Ambulatory Visit | Attending: Neurology | Admitting: Neurology

## 2018-07-31 ENCOUNTER — Other Ambulatory Visit: Payer: Self-pay

## 2018-07-31 DIAGNOSIS — R42 Dizziness and giddiness: Secondary | ICD-10-CM | POA: Diagnosis not present

## 2018-07-31 DIAGNOSIS — R4189 Other symptoms and signs involving cognitive functions and awareness: Secondary | ICD-10-CM | POA: Diagnosis present

## 2018-07-31 LAB — POCT I-STAT CREATININE: Creatinine, Ser: 0.6 mg/dL (ref 0.44–1.00)

## 2018-07-31 MED ORDER — GADOBUTROL 1 MMOL/ML IV SOLN
10.0000 mL | Freq: Once | INTRAVENOUS | Status: AC | PRN
  Administered 2018-07-31: 10 mL via INTRAVENOUS

## 2018-08-10 ENCOUNTER — Ambulatory Visit (INDEPENDENT_AMBULATORY_CARE_PROVIDER_SITE_OTHER): Payer: No Typology Code available for payment source

## 2018-08-10 ENCOUNTER — Other Ambulatory Visit: Payer: Self-pay

## 2018-08-10 ENCOUNTER — Telehealth: Payer: Self-pay | Admitting: *Deleted

## 2018-08-10 ENCOUNTER — Encounter: Payer: Self-pay | Admitting: Emergency Medicine

## 2018-08-10 ENCOUNTER — Ambulatory Visit
Admission: EM | Admit: 2018-08-10 | Discharge: 2018-08-10 | Disposition: A | Discharge status: Home / Self Care | Payer: No Typology Code available for payment source | Attending: Family Medicine | Admitting: Family Medicine

## 2018-08-10 DIAGNOSIS — R109 Unspecified abdominal pain: Secondary | ICD-10-CM | POA: Diagnosis not present

## 2018-08-10 DIAGNOSIS — R350 Frequency of micturition: Secondary | ICD-10-CM

## 2018-08-10 DIAGNOSIS — M545 Low back pain: Secondary | ICD-10-CM | POA: Diagnosis not present

## 2018-08-10 DIAGNOSIS — N200 Calculus of kidney: Secondary | ICD-10-CM

## 2018-08-10 LAB — URINALYSIS, COMPLETE (UACMP) WITH MICROSCOPIC
Bacteria, UA: NONE SEEN
Bilirubin Urine: NEGATIVE
Glucose, UA: NEGATIVE mg/dL
Hgb urine dipstick: NEGATIVE
Ketones, ur: NEGATIVE mg/dL
Nitrite: NEGATIVE
Protein, ur: NEGATIVE mg/dL
RBC / HPF: NONE SEEN RBC/hpf (ref 0–5)
Specific Gravity, Urine: 1.025 (ref 1.005–1.030)
pH: 5.5 (ref 5.0–8.0)

## 2018-08-10 LAB — COMPREHENSIVE METABOLIC PANEL
ALT: 42 U/L (ref 0–44)
AST: 40 U/L (ref 15–41)
Albumin: 4.3 g/dL (ref 3.5–5.0)
Alkaline Phosphatase: 75 U/L (ref 38–126)
Anion gap: 6 (ref 5–15)
BUN: 18 mg/dL (ref 6–20)
CO2: 25 mmol/L (ref 22–32)
Calcium: 10.2 mg/dL (ref 8.9–10.3)
Chloride: 104 mmol/L (ref 98–111)
Creatinine, Ser: 0.81 mg/dL (ref 0.44–1.00)
GFR calc Af Amer: >60 mL/min (ref >60)
GFR calc non Af Amer: >60 mL/min (ref >60)
Glucose, Bld: 91 mg/dL (ref 70–99)
Potassium: 4.1 mmol/L (ref 3.5–5.1)
Sodium: 135 mmol/L (ref 135–145)
Total Bilirubin: 1 mg/dL (ref 0.3–1.2)
Total Protein: 8.3 g/dL — ABNORMAL HIGH (ref 6.5–8.1)

## 2018-08-10 LAB — CBC WITH DIFFERENTIAL/PLATELET
Abs Immature Granulocytes: 0.01 10*3/uL (ref 0.00–0.07)
Basophils Absolute: 0.1 10*3/uL (ref 0.0–0.1)
Basophils Relative: 1 %
Eosinophils Absolute: 0.2 10*3/uL (ref 0.0–0.5)
Eosinophils Relative: 3 %
HCT: 38.7 % (ref 36.0–46.0)
Hemoglobin: 12.7 g/dL (ref 12.0–15.0)
Immature Granulocytes: 0 %
Lymphocytes Relative: 35 %
Lymphs Abs: 2.7 10*3/uL (ref 0.7–4.0)
MCH: 28.3 pg (ref 26.0–34.0)
MCHC: 32.8 g/dL (ref 30.0–36.0)
MCV: 86.4 fL (ref 80.0–100.0)
Monocytes Absolute: 0.6 10*3/uL (ref 0.1–1.0)
Monocytes Relative: 8 %
Neutro Abs: 4.1 10*3/uL (ref 1.7–7.7)
Neutrophils Relative %: 53 %
Platelets: 315 10*3/uL (ref 150–400)
RBC: 4.48 MIL/uL (ref 3.87–5.11)
RDW: 13.5 % (ref 11.5–15.5)
WBC: 7.8 10*3/uL (ref 4.0–10.5)
nRBC: 0 % (ref 0.0–0.2)

## 2018-08-10 LAB — LACTIC ACID, PLASMA: Lactic Acid, Venous: 0.8 mmol/L (ref 0.5–1.9)

## 2018-08-10 MED ORDER — HYDROCODONE-ACETAMINOPHEN 5-325 MG PO TABS: 1.0000 | ORAL_TABLET | Freq: Three times a day (TID) | ORAL | 0 refills | Status: DC | PRN

## 2018-08-10 MED ORDER — TAMSULOSIN HCL 0.4 MG PO CAPS: 0.4000 mg | ORAL_CAPSULE | Freq: Every day | ORAL | 0 refills | Status: DC

## 2018-08-10 NOTE — Discharge Instructions (Signed)
Lots of fluids.  Medications as we discussed.  Take care  Dr. Adriana Simas

## 2018-08-10 NOTE — Telephone Encounter (Signed)
Patient to be scheduled  For surgery ASAP at [facility].

## 2018-08-10 NOTE — ED Notes (Signed)
Per pt insurance no prior authorization needed for CT

## 2018-08-10 NOTE — ED Triage Notes (Signed)
Patient c/o right sided back pain that started late Saturday night.  Patient also reports increase urinary urgency. Patient denies fevers.  Patient denies N/V.

## 2018-08-10 NOTE — ED Provider Notes (Signed)
[facility]    CSN: [id number] Arrival date & time: 08/10/18  1233  History   Chief Complaint Chief Complaint  Patient presents with   Back Pain    right   HPI   53 year old female presents with back pain/flank pain.  Started late Saturday/Sunday.  Patient reports right-sided back pain/flank pain.  She reports urinary urgency.  No fever.  Patient denies abdominal pain at this time.  Patient states that her pain is worse with movement.  No fever.  No chills.  No nausea or vomiting.  Severe at times.  Currently mild, 2/10 in severity.  Patient has a history of diverticulitis and has recently taken Cipro and Flagyl without improvement.  Patient is concerned as she is not improving and is worsening.  No known relieving factors.  No other associated symptoms.  No other complaints.  PMH, Surgical Hx, Family Hx, Social History reviewed and updated as below.  Past Medical History:  Diagnosis Date   Allergy    Anemia    Arthritis    back, left elbow, left hip, left ankle    Asthma    Blood transfusion without reported diagnosis    Diabetes mellitus without complication (HCC)    diet controlled   GERD (gastroesophageal reflux disease)    PONV (postoperative nausea and vomiting)    Sleep apnea    Thyroid nodule 06/20/2018  Diverticulitis  Patient Active Problem List   Diagnosis Date Noted   Hyperparathyroidism (HCC) 06/20/2018   Thyroid nodule 06/20/2018   Intractable pain 04/12/2015    Past Surgical History:  Procedure Laterality Date   CESAREAN SECTION     CHOLECYSTECTOMY     ELBOW FRACTURE SURGERY  1995   FRACTURE SURGERY     left ankle complete repair, left elbow   HERNIA REPAIR     incisional   LAPAROSCOPIC GASTRIC SLEEVE RESECTION  2014   pelvic cap Left 1995   TONSILLECTOMY AND ADENOIDECTOMY N/A 12/08/2015   Procedure: TONSILLECTOMY AND ADENOIDECTOMY;  Surgeon: Linus Salmons, MD;  Location: [facility] ORS;  Service: ENT;  Laterality:  N/A;   TUBAL LIGATION      OB History   No obstetric history on file.      Home Medications    Prior to Admission medications   Medication Sig Start Date End Date Taking? Authorizing Provider  Biotin 2500 MCG CAPS Take 5,000 mcg by mouth daily.   Yes [provider]  budesonide-formoterol (SYMBICORT) 80-4.5 MCG/ACT inhaler Inhale 2 puffs into the lungs daily as needed (shortness of breath).    Yes [provider]  busPIRone (BUSPAR) 5 MG tablet Take 5 mg by mouth 3 (three) times daily.   Yes [provider]  Cholecalciferol 1000 UNITS tablet Take 5,000 Units by mouth daily.   Yes [provider]  escitalopram (LEXAPRO) 10 MG tablet Take 10 mg by mouth daily.   Yes [provider]  ferrous sulfate 325 (65 FE) MG tablet Take 650 mg by mouth 2 (two) times daily with a meal.   Yes [provider]  fluticasone (FLONASE) 50 MCG/ACT nasal spray Place into both nostrils daily.   Yes [provider]  Lifitegrast Benay Spice) 5 % SOLN Apply to eye.   Yes [provider]  meclizine (ANTIVERT) 25 MG tablet Take 25 mg by mouth 3 (three) times daily as needed for dizziness.   Yes [provider]  Multiple Vitamins-Minerals (MULTIVITAMIN WITH MINERALS) tablet Take 1 tablet by mouth daily.   Yes  [provider]  omeprazole (PRILOSEC) 40 MG capsule Take 40 mg by mouth daily.   Yes [provider]  polycarbophil (FIBER LAXATIVE) 625 MG tablet Take 625 mg by mouth daily.   Yes [provider]  prochlorperazine (COMPAZINE) 10 MG tablet Take 10 mg by mouth every 6 (six) hours as needed for nausea or vomiting.   Yes [provider]  tiZANidine (ZANAFLEX) 4 MG tablet Take 4 mg by mouth every 6 (six) hours as needed for muscle spasms.   Yes [provider]  vitamin E (VITAMIN E) 400 UNIT capsule Take 400 Units by mouth daily.   Yes [provider]  albuterol (PROVENTIL HFA;VENTOLIN  HFA) 108 (90 BASE) MCG/ACT inhaler Inhale 2 puffs into the lungs every 6 (six) hours as needed for wheezing or shortness of breath.    [provider]  EPINEPHrine (EPIPEN 2-PAK) 0.3 mg/0.3 mL IJ SOAJ injection Inject 0.3 mg into the muscle as needed (for anaphylaxis).    [provider]  HYDROcodone-acetaminophen (NORCO/VICODIN) 5-325 MG tablet Take 1 tablet by mouth every 8 (eight) hours as needed for moderate pain or severe pain. 08/10/18   Tommie Sams, DO  phentermine 37.5 MG capsule Take 37.5 mg by mouth every morning.    [provider]  tamsulosin (FLOMAX) 0.4 MG CAPS capsule Take 1 capsule (0.4 mg total) by mouth daily. 08/10/18   Tommie Sams, DO    Family History Family History  Problem Relation Age of Onset   Diabetes Mother    Hypertension Mother    Cancer Mother    Asthma Mother    Diabetes Father    Hyperlipidemia Father    Hypertension Father    Kidney disease Father    Heart disease Father    Diabetes Maternal Aunt    Diabetes Maternal Uncle    Diabetes Paternal Aunt    Diabetes Paternal Uncle    Breast cancer Maternal Grandmother 65    Social History Social History   Tobacco Use   Smoking status: Never Smoker   Smokeless tobacco: Never Used  Substance Use Topics   Alcohol use: No    Alcohol/week: 0.0 standard drinks   Drug use: No     Allergies   Bee venom; Influenza vac split [flu virus vaccine]; Metformin; Orange fruit [citrus]; Dilaudid [hydromorphone]; and Penicillins   Review of Systems Review of Systems  Constitutional: Negative for fever.  Gastrointestinal: Negative for nausea and vomiting.  Genitourinary: Positive for flank pain and urgency.  Musculoskeletal: Positive for back pain.   Physical Exam Triage Vital Signs ED Triage Vitals  Enc Vitals Group     BP 08/10/18 1243 114/78     Pulse Rate 08/10/18 1243 83     Resp 08/10/18 1243 16     Temp 08/10/18 1243 98.3 F (36.8 C)     Temp  Source 08/10/18 1243 Oral     SpO2 08/10/18 1243 100 %     Weight 08/10/18 1240 240 lb (108.9 kg)     Height 08/10/18 1240  (1.676 m)     Head Circumference --      Peak Flow --      Pain Score 08/10/18 1239 2     Pain Loc --      Pain Edu? --      Excl. in GC? --    Updated Vital Signs BP 114/78 (BP Location: Right Arm)    Pulse 83    Temp 98.3 F (  36.8 C) (Oral)    Resp 16    Ht 5\' 6"  (1.676 m)    Wt 108.9 kg    SpO2 100%    BMI 38.74 kg/m   Visual Acuity Right Eye Distance:   Left Eye Distance:   Bilateral Distance:    Right Eye Near:   Left Eye Near:    Bilateral Near:     Physical Exam Constitutional:      General: She is not in acute distress.    Appearance: Normal appearance. She is obese.  HENT:     Head: Normocephalic and atraumatic.  Eyes:     General:        Right eye: No discharge.        Left eye: No discharge.     Conjunctiva/sclera: Conjunctivae normal.  Cardiovascular:     Rate and Rhythm: Normal rate and regular rhythm.  Pulmonary:     Effort: Pulmonary effort is normal. No respiratory distress.     Breath sounds: Normal breath sounds.  Abdominal:     General: There is no distension.     Palpations: Abdomen is soft.     Tenderness: There is right CVA tenderness.     Comments: Right upper quadrant tenderness to palpation  Neurological:     Mental Status: She is alert.  Psychiatric:        Mood and Affect: Mood normal.        Behavior: Behavior normal.    UC Treatments / Results  Labs (all labs ordered are listed, but only abnormal results are displayed) Labs Reviewed  URINALYSIS, COMPLETE (UACMP) WITH MICROSCOPIC - Abnormal; Notable for the following components:      Result Value   Leukocytes,Ua TRACE (*)    All other components within normal limits  COMPREHENSIVE METABOLIC PANEL - Abnormal; Notable for the following components:   Total Protein 8.3 (*)    All other components within normal limits  CBC WITH DIFFERENTIAL/PLATELET    LACTIC ACID, PLASMA    EKG None  Radiology Ct Abdomen Pelvis Wo Contrast  Result Date: 08/10/2018 CLINICAL DATA:  Right-sided abdominal pain. Tenderness since Sunday morning. History of diverticulitis and prior gastric sleeve. Diverticulitis suspected. EXAM: CT ABDOMEN AND PELVIS WITHOUT CONTRAST TECHNIQUE: Multidetector CT imaging of the abdomen and pelvis was performed following the standard protocol without IV contrast. COMPARISON:  05/17/2012 FINDINGS: Lower chest: Clear lung bases. Normal heart size without pericardial or pleural effusion. Small hiatal hernia. Hepatobiliary: Normal noncontrast appearance of the liver. Cholecystectomy, without biliary ductal dilatation. Pancreas: Normal, without mass or ductal dilatation. Spleen: Normal in size, without focal abnormality. Adrenals/Urinary Tract: Bilateral adrenal thickening. Lower pole punctate right renal collecting system calculus. Normal noncontrast appearance of the left kidney. No hydronephrosis. A calcification within the upper left pelvis is vascular, positioned just anterior to the left ureter on image 61/2. No bladder calculi. Stomach/Bowel: Gastric sleeve procedure. Gastric antral underdistention. Scattered colonic diverticula. Normal terminal ileum and appendix. Normal small bowel. Vascular/Lymphatic: Normal caliber of the aorta and branch vessels. No abdominopelvic adenopathy. Reproductive: Normal uterus and adnexa. Other: No significant free fluid.  Mild pelvic floor laxity. Musculoskeletal: Surgical changes about the left acetabulum with advanced osteoarthritis of the left hip. Lower anterior right rib deformity related to remote trauma. Remote superior endplate compression deformities at L3 and L1 are mild and similar to the MRI of 04/12/2015. Trace L4-5 anterolisthesis is not significantly changed. IMPRESSION: 1. Right nephrolithiasis, without obstructive uropathy. 2. No other explanation for right-sided pain.  3. Prior gastric sleeve  procedure with new small hiatal hernia. Electronically Signed   By: Jeronimo Greaves M.D.   On: 08/10/2018 14:15    Procedures Procedures (including critical care time)  Medications Ordered in UC Medications - No data to display  Initial Impression / Assessment and Plan / UC Course  I have reviewed the triage vital signs and the nursing notes.  Pertinent labs & imaging results that were available during my care of the patient were reviewed by me and considered in my medical decision making (see chart for details).    53 year old female presents with nephrolithiasis.  Confirmed by CT.  Treating with Flomax, Vicodin.  Advised to push fluids.  Strain urine.  [place] controlled substance database reviewed.  No concerns for abuse at this time.  Final Clinical Impressions(s) / UC Diagnoses   Final diagnoses:  Nephrolithiasis     Discharge Instructions     Lots of fluids.  Medications as we discussed.  Take care  Dr. Adriana Simas    ED Prescriptions    Medication Sig Dispense Auth. Provider   HYDROcodone-acetaminophen (NORCO/VICODIN) 5-325 MG tablet Take 1 tablet by mouth every 8 (eight) hours as needed for moderate pain or severe pain. 10 tablet Lareta Bruneau G, DO   tamsulosin (FLOMAX) 0.4 MG CAPS capsule Take 1 capsule (0.4 mg total) by mouth daily. 14 capsule Tommie Sams, DO     Controlled Substance Prescriptions [place] Controlled Substance Registry consulted? Yes, I have consulted the [place] Controlled Substances Registry for this patient, and feel the risk/benefit ratio today is favorable for proceeding with this prescription for a controlled substance.   Tommie Sams, [place] 08/10/18 1507

## 2018-09-25 ENCOUNTER — Telehealth: Payer: Self-pay | Admitting: *Deleted

## 2018-09-25 NOTE — Telephone Encounter (Signed)
Patient contacted today to see if she would like to schedule parathyroidectomy that was previously postponed due to COVID-19.   Patient's surgery to be scheduled for 10-25-18 at [facility] with Dr. Celine Ahr.   The patient is aware to have COVID-19 testing done on 10-22-18 at the [facility] building drive thru ([address] [place]). She is aware to isolate after, have no visitors, wash hands frequently, and avoid touching face.   The patient is aware she will need to Pre-Admit. Patient will check in at the [place] entrance due to COVID-19 restrictions and will then be escorted to the [facility], [address] (first floor). Patient will be contacted once Pre-admission appointment has been arranged with date and time. We will try and coordinate with COVID testing.   Pre-op visit with Dr. Celine Ahr scheduled for 10-17-18 at 9:45 am.  Patient will be contacted for further instructions once surgery has been posted with [facility].

## 2018-09-27 NOTE — Telephone Encounter (Signed)
Patient aware surgery has been scheduled for 10-25-18.  The patient is aware she will need to Pre-Admit on 10-22-18 at 9 am. Patient will check in at the [place] entrance due to COVID-19 restrictions and will then be escorted to the [facility], [address] (first floor). COVID testing will be done after.   Patient aware to be NPO after midnight and have a driver.   She is aware to check in at the [place] entrance where she will be screened for the coronavirus and then sent to [facility].   Patient aware that she may have no visitors and driver will need to wait in the car due to COVID-19 restrictions.   The patient was reminded of appointment with Dr. Celine Ahr on 10-17-18 at 9:45 am.  The patient verbalizes understanding of the above.   The patient is aware to call the office should she have further questions.

## 2018-10-17 ENCOUNTER — Encounter: Payer: Self-pay | Admitting: General Surgery

## 2018-10-17 ENCOUNTER — Other Ambulatory Visit: Payer: Self-pay

## 2018-10-17 ENCOUNTER — Ambulatory Visit: Payer: No Typology Code available for payment source | Admitting: General Surgery

## 2018-10-17 VITALS — BP 108/73 | HR 70 | Temp 97.9°F | Ht 66.5 in | Wt 243.6 lb

## 2018-10-17 DIAGNOSIS — E213 Hyperparathyroidism, unspecified: Secondary | ICD-10-CM | POA: Diagnosis not present

## 2018-10-17 NOTE — Addendum Note (Signed)
Addended by: Fredirick Maudlin on: 10/17/2018 11:58 AM   Modules accepted: Orders, SmartSet

## 2018-10-17 NOTE — H&P (View-Only) (Signed)
Patient ID: Ashley [name], female   DOB: Oct 13, 1965, 53 y.o.   MRN: [id number]  Chief Complaint  Patient presents with  . Pre-op Exam    HPI Ashley [name] is a 53 y.o. female.  I initially saw her in March of this year, prior to the COVID-19 shutdown.  My initial history of present illness is copied here:   "She was referred by Dr. Graciela [name] for further evaluation and management of primary hyperparathyroidism.  She states that Dr. Graciela [name] has been following her calcium level for years.  Over time it has crept higher and higher.  Additional biochemical evaluation was performed and was most consistent with primary hyperparathyroidism.  She was [facility] Sel[facility] [name], [facility], however her insurance will not cover any procedures performed out of network.  Dr. Selena [name] performed an ultrasound to try and localize the abnormal parathyroid gland.  His notes state that he did not see one but that he did see a thyroid nodule that required biopsy.  A fine-needle aspiration biopsy was performed, however it was nondiagnostic due to insufficient cells.  As I am now within the patient's network, she has been referred to me, as an Architectendocrine surgeon, to evaluate and manage her disease.  Ashley [name] states that her primary symptom is brain fog and poor memory.  She also relates episodes of vertigo and has had been evaluated for vestibular issues.  She endorses constipation and gastroesophageal reflux disease.  She reports long bone pain and fatigue.  She reports having frequent mood swings as well as poor sleep.  She denies any history of pathologic fracture, nephrolithiasis, or pancreatitis.  Of note, she has had bariatric surgery, but this was a sleeve, rather than a bypass.  She states that no family members have any history of hyperparathyroidism.  No history of jaw tumors or of any known heritable endocrinopathy.  She is not taking in the calcium supplements, nor she on any medications known to  increase the serum calcium.  She does have slightly low vitamin D levels, however they are not so low as to suggest a secondary source of elevated intact PTH.  She is on oral vitamin D supplements."  At that initial visit, I did biopsy the nodule of concern.  The cytopathology was atypia of undetermined significance.  Molecular diagnostic testing was performed.  No deleterious mutations were detected.  I also ordered a 4-dimensional CT scan to try and localize the abnormal parathyroid tissue.  There is a potential candidate seen, however it is felt to more likely represent a lymph node.  She is scheduled for surgery and is here for an update of her history and physical.  She states that she has not really had any significant changes to her symptoms.    Past Medical History:  Diagnosis Date  . Allergy   . Anemia   . Arthritis    back, left elbow, left hip, left ankle   . Asthma   . Blood transfusion without reported diagnosis   . Diabetes mellitus without complication (HCC)    diet controlled  . GERD (gastroesophageal reflux disease)   . PONV (postoperative nausea and vomiting)   . Sleep apnea   . Thyroid nodule 06/20/2018    Past Surgical History:  Procedure Laterality Date  . CESAREAN SECTION    . CHOLECYSTECTOMY    . ELBOW FRACTURE SURGERY  1995  . FRACTURE SURGERY     left ankle complete repair, left elbow  . HERNIA REPAIR  incisional  . LAPAROSCOPIC GASTRIC SLEEVE RESECTION  2014  . pelvic cap Left 1995  . TONSILLECTOMY AND ADENOIDECTOMY N/A 12/08/2015   Procedure: TONSILLECTOMY AND ADENOIDECTOMY;  Surgeon: Linus Salmonshapman McQueen, MD;  Location: [facility] ORS;  Service: ENT;  Laterality: N/A;  . TUBAL LIGATION      Family History  Problem Relation Age of Onset  . Diabetes Mother   . Hypertension Mother   . Cancer Mother   . Asthma Mother   . Diabetes Father   . Hyperlipidemia Father   . Hypertension Father   . Kidney disease Father   . Heart disease Father   . Diabetes  Maternal Aunt   . Diabetes Maternal Uncle   . Diabetes Paternal Aunt   . Diabetes Paternal Uncle   . Breast cancer Maternal Grandmother [id number]    Social History Social History   Tobacco Use  . Smoking status: Never Smoker  . Smokeless tobacco: Never Used  Substance Use Topics  . Alcohol use: No    Alcohol/week: 0.0 standard drinks  . Drug use: No    Allergies  Allergen Reactions  . Bee Venom Anaphylaxis  . Influenza Vac Split [Flu Virus Vaccine] Anaphylaxis  . Metformin Diarrhea  . Orange Fruit [Citrus] Itching  . Dilaudid [Hydromorphone] Itching  . Penicillins Nausea And Vomiting, Rash and Other (See Comments)    Has patient had a PCN reaction causing immediate rash, facial/tongue/throat swelling, SOB or lightheadedness with hypotension: Yes Has patient had a PCN reaction causing severe rash involving mucus membranes or skin necrosis: No Has patient had a PCN reaction that required hospitalization No Has patient had a PCN reaction occurring within the last 10 years: No If all of the above answers are "NO", then may proceed with Cephalosporin use.     Current Outpatient Medications  Medication Sig Dispense Refill  . albuterol (PROVENTIL HFA;VENTOLIN HFA) 108 (90 BASE) MCG/ACT inhaler Inhale 2 puffs into the lungs every 6 (six) hours as needed for wheezing or shortness of breath.    . Biotin 2500 MCG CAPS Take 5,000 mcg by mouth daily.    . budesonide-formoterol (SYMBICORT) 80-4.5 MCG/ACT inhaler Inhale 2 puffs into the lungs daily as needed (shortness of breath).     . busPIRone (BUSPAR) 5 MG tablet Take 5 mg by mouth 3 (three) times daily.    . Cholecalciferol 1000 UNITS tablet Take 5,000 Units by mouth daily.    Marland Kitchen. EMGALITY 120 MG/ML SOSY     . EPINEPHrine (EPIPEN 2-PAK) 0.3 mg/0.3 mL IJ SOAJ injection Inject 0.3 mg into the muscle as needed (for anaphylaxis).    Marland Kitchen. escitalopram (LEXAPRO) 10 MG tablet Take 10 mg by mouth daily.    . ferrous sulfate 325 (65 FE) MG tablet Take  650 mg by mouth 2 (two) times daily with a meal.    . fluticasone (FLONASE) 50 MCG/ACT nasal spray Place into both nostrils daily.    Marland Kitchen. Lifitegrast (XIIDRA) 5 % SOLN Apply to eye.    . meclizine (ANTIVERT) 25 MG tablet Take 25 mg by mouth 3 (three) times daily as needed for dizziness.    . Multiple Vitamins-Minerals (MULTIVITAMIN WITH MINERALS) tablet Take 1 tablet by mouth daily.    Marland Kitchen. omeprazole (PRILOSEC) 40 MG capsule Take 40 mg by mouth daily.    . polycarbophil (FIBER LAXATIVE) 625 MG tablet Take 625 mg by mouth daily.    . prochlorperazine (COMPAZINE) 10 MG tablet Take 10 mg by mouth every 6 (six) hours as needed for  nausea or vomiting.    Marland Kitchen. tiZANidine (ZANAFLEX) 4 MG tablet Take 4 mg by mouth every 6 (six) hours as needed for muscle spasms.    Marland Kitchen. topiramate (TOPAMAX) 50 MG tablet Take 25 mg nightly for 2 weeks then increase to 50 mg nightly.    . vitamin E (VITAMIN E) 400 UNIT capsule Take 400 Units by mouth daily.     No current facility-administered medications for this visit.     Review of Systems Review of Systems  All other systems reviewed and are negative.   Blood pressure 108/73, pulse 70, temperature 97.9 F (36.6 C), height 5' 6.5" (1.689 m), weight 243 lb 9.6 oz (110.5 kg), SpO2 99 %.  Physical Exam Physical Exam Vitals signs reviewed.  Constitutional:      General: She is not in acute distress.    Appearance: Normal appearance. She is obese.  HENT:     Head: Normocephalic and atraumatic.     Nose:     Comments: Covered with a mask secondary to COVID-19 precautions    Mouth/Throat:     Comments: Covered with a mask secondary to COVID-19 precautions Eyes:     General: No scleral icterus.    Conjunctiva/sclera: Conjunctivae normal.     Pupils: Pupils are equal, round, and reactive to light.  Neck:     Musculoskeletal: Normal range of motion. No neck rigidity.     Comments: No thyromegaly.  No masses appreciated.  The thyroid gland moves freely with deglutition.  Cardiovascular:     Rate and Rhythm: Normal rate and regular rhythm.     Pulses: Normal pulses.  Pulmonary:     Effort: Pulmonary effort is normal.     Breath sounds: Normal breath sounds.  Abdominal:     General: Bowel sounds are normal.     Palpations: Abdomen is soft.  Genitourinary:    Comments: Deferred Musculoskeletal: Normal range of motion.  Lymphadenopathy:     Cervical: No cervical adenopathy.  Skin:    General: Skin is warm and dry.  Neurological:     General: No focal deficit present.     Mental Status: She is alert and oriented to person, place, and time.  Psychiatric:        Mood and Affect: Mood normal.        Behavior: Behavior normal.     Data Reviewed I reviewed the cytopathology from her biopsy on 25 March.  As discussed above, there was atypia of undetermined significance with negative thyroseq testing.    I also personally reviewed the CT scan performed on March 31.  The results are copied below: FINDINGS: MULTIPHASE PARATHYROID PROTOCOL FINDINGS  Candidate lesion: 1, best seen on axial series 6 image is 129 and coronal series 9 image 72. Between the origins of the brachiocephalic artery and left common carotid artery from the aortic arch.  Size (mm): 4 AP x 5 TV x 4 CC  Location: Below the level of the cricoid cartilage. Slightly anterior to the tracheoesophageal groove.  Polar vessel: None  Enhancement pattern: B - hypodense to normal thyroid tissue precontrast, arterial phase and venous phase     Assessment This is a 53 year old woman with biochemical evidence of primary hyperparathyroidism.  She has a number of secondary symptoms and due to her young age, warrant surgical intervention.  We will plan for parathyroidectomy.  I did discuss the possibility of a negative exploration, due to the limits of her imaging.  We will utilize intraoperative PTH  liberally to try and optimize our resection.  The risks the operation were discussed with  her.  These include, but are not limited to, bleeding, infection, damage to the recurrent laryngeal nerves, negative exploration as above, persistent or recurrent hyperparathyroidism, postsurgical hypoparathyroidism (temporary or permanent), need for additional surgery or treatment in the future.  All of her questions were answered to her satisfaction.  Plan We will proceed with surgery as scheduled.    Fredirick Maudlin 10/17/2018, 11:44 AM

## 2018-10-17 NOTE — Progress Notes (Signed)
Patient ID: Ashley [name], female   DOB: Oct 13, 1965, 53 y.o.   MRN: [id number]  Chief Complaint  Patient presents with  . Pre-op Exam    HPI Ashley [name] is a 53 y.o. female.  I initially saw her in March of this year, prior to the COVID-19 shutdown.  My initial history of present illness is copied here:   "She was referred by Dr. Graciela [name] for further evaluation and management of primary hyperparathyroidism.  She states that Dr. Graciela [name] has been following her calcium level for years.  Over time it has crept higher and higher.  Additional biochemical evaluation was performed and was most consistent with primary hyperparathyroidism.  She was seen by [facility]. Selena [name], at [facility], however her insurance will not cover any procedures performed out of network.  Dr. Selena [name] performed an ultrasound to try and localize the abnormal parathyroid gland.  His notes state that he did not see one but that he did see a thyroid nodule that required biopsy.  A fine-needle aspiration biopsy was performed, however it was nondiagnostic due to insufficient cells.  As I am now within the patient's network, she has been referred to me, as an Architectendocrine surgeon, to evaluate and manage her disease.  Ashley [name] states that her primary symptom is brain fog and poor memory.  She also relates episodes of vertigo and has had been evaluated for vestibular issues.  She endorses constipation and gastroesophageal reflux disease.  She reports long bone pain and fatigue.  She reports having frequent mood swings as well as poor sleep.  She denies any history of pathologic fracture, nephrolithiasis, or pancreatitis.  Of note, she has had bariatric surgery, but this was a sleeve, rather than a bypass.  She states that no family members have any history of hyperparathyroidism.  No history of jaw tumors or of any known heritable endocrinopathy.  She is not taking in the calcium supplements, nor she on any medications known to  increase the serum calcium.  She does have slightly low vitamin D levels, however they are not so low as to suggest a secondary source of elevated intact PTH.  She is on oral vitamin D supplements."  At that initial visit, I did biopsy the nodule of concern.  The cytopathology was atypia of undetermined significance.  Molecular diagnostic testing was performed.  No deleterious mutations were detected.  I also ordered a 4-dimensional CT scan to try and localize the abnormal parathyroid tissue.  There is a potential candidate seen, however it is felt to more likely represent a lymph node.  She is scheduled for surgery and is here for an update of her history and physical.  She states that she has not really had any significant changes to her symptoms.    Past Medical History:  Diagnosis Date  . Allergy   . Anemia   . Arthritis    back, left elbow, left hip, left ankle   . Asthma   . Blood transfusion without reported diagnosis   . Diabetes mellitus without complication (HCC)    diet controlled  . GERD (gastroesophageal reflux disease)   . PONV (postoperative nausea and vomiting)   . Sleep apnea   . Thyroid nodule 06/20/2018    Past Surgical History:  Procedure Laterality Date  . CESAREAN SECTION    . CHOLECYSTECTOMY    . ELBOW FRACTURE SURGERY  1995  . FRACTURE SURGERY     left ankle complete repair, left elbow  . HERNIA REPAIR  incisional  . LAPAROSCOPIC GASTRIC SLEEVE RESECTION  2014  . pelvic cap Left 1995  . TONSILLECTOMY AND ADENOIDECTOMY N/A 12/08/2015   Procedure: TONSILLECTOMY AND ADENOIDECTOMY;  Surgeon: Ashley McQueen, MD;  Location: [facility] ORS;  Service: ENT;  Laterality: N/A;  . TUBAL LIGATION      Family History  Problem Relation Age of Onset  . Diabetes Mother   . Hypertension Mother   . Cancer Mother   . Asthma Mother   . Diabetes Father   . Hyperlipidemia Father   . Hypertension Father   . Kidney disease Father   . Heart disease Father   . Diabetes  Maternal Aunt   . Diabetes Maternal Uncle   . Diabetes Paternal Aunt   . Diabetes Paternal Uncle   . Breast cancer Maternal Grandmother 40    Social History Social History   Tobacco Use  . Smoking status: Never Smoker  . Smokeless tobacco: Never Used  Substance Use Topics  . Alcohol use: No    Alcohol/week: 0.0 standard drinks  . Drug use: No    Allergies  Allergen Reactions  . Bee Venom Anaphylaxis  . Influenza Vac Split [Flu Virus Vaccine] Anaphylaxis  . Metformin Diarrhea  . Orange Fruit [Citrus] Itching  . Dilaudid [Hydromorphone] Itching  . Penicillins Nausea And Vomiting, Rash and Other (See Comments)    Has patient had a PCN reaction causing immediate rash, facial/tongue/throat swelling, SOB or lightheadedness with hypotension: Yes Has patient had a PCN reaction causing severe rash involving mucus membranes or skin necrosis: No Has patient had a PCN reaction that required hospitalization No Has patient had a PCN reaction occurring within the last 10 years: No If all of the above answers are "NO", then may proceed with Cephalosporin use.     Current Outpatient Medications  Medication Sig Dispense Refill  . albuterol (PROVENTIL HFA;VENTOLIN HFA) 108 (90 BASE) MCG/ACT inhaler Inhale 2 puffs into the lungs every 6 (six) hours as needed for wheezing or shortness of breath.    . Biotin 2500 MCG CAPS Take 5,000 mcg by mouth daily.    . budesonide-formoterol (SYMBICORT) 80-4.5 MCG/ACT inhaler Inhale 2 puffs into the lungs daily as needed (shortness of breath).     . busPIRone (BUSPAR) 5 MG tablet Take 5 mg by mouth 3 (three) times daily.    . Cholecalciferol 1000 UNITS tablet Take 5,000 Units by mouth daily.    . EMGALITY 120 MG/ML SOSY     . EPINEPHrine (EPIPEN 2-PAK) 0.3 mg/0.3 mL IJ SOAJ injection Inject 0.3 mg into the muscle as needed (for anaphylaxis).    . escitalopram (LEXAPRO) 10 MG tablet Take 10 mg by mouth daily.    . ferrous sulfate 325 (65 FE) MG tablet Take  650 mg by mouth 2 (two) times daily with a meal.    . fluticasone (FLONASE) 50 MCG/ACT nasal spray Place into both nostrils daily.    . Lifitegrast (XIIDRA) 5 % SOLN Apply to eye.    . meclizine (ANTIVERT) 25 MG tablet Take 25 mg by mouth 3 (three) times daily as needed for dizziness.    . Multiple Vitamins-Minerals (MULTIVITAMIN WITH MINERALS) tablet Take 1 tablet by mouth daily.    . omeprazole (PRILOSEC) 40 MG capsule Take 40 mg by mouth daily.    . polycarbophil (FIBER LAXATIVE) 625 MG tablet Take 625 mg by mouth daily.    . prochlorperazine (COMPAZINE) 10 MG tablet Take 10 mg by mouth every 6 (six) hours as needed for   nausea or vomiting.    Marland Kitchen. tiZANidine (ZANAFLEX) 4 MG tablet Take 4 mg by mouth every 6 (six) hours as needed for muscle spasms.    Marland Kitchen. topiramate (TOPAMAX) 50 MG tablet Take 25 mg nightly for 2 weeks then increase to 50 mg nightly.    . vitamin E (VITAMIN E) 400 UNIT capsule Take 400 Units by mouth daily.     No current facility-administered medications for this visit.     Review of Systems Review of Systems  All other systems reviewed and are negative.   Blood pressure 108/73, pulse 70, temperature 97.9 F (36.6 C), height 5' 6.5" (1.689 m), weight 243 lb 9.6 oz (110.5 kg), SpO2 99 %.  Physical Exam Physical Exam Vitals signs reviewed.  Constitutional:      General: She is not in acute distress.    Appearance: Normal appearance. She is obese.  HENT:     Head: Normocephalic and atraumatic.     Nose:     Comments: Covered with a mask secondary to COVID-19 precautions    Mouth/Throat:     Comments: Covered with a mask secondary to COVID-19 precautions Eyes:     General: No scleral icterus.    Conjunctiva/sclera: Conjunctivae normal.     Pupils: Pupils are equal, round, and reactive to light.  Neck:     Musculoskeletal: Normal range of motion. No neck rigidity.     Comments: No thyromegaly.  No masses appreciated.  The thyroid gland moves freely with deglutition.  Cardiovascular:     Rate and Rhythm: Normal rate and regular rhythm.     Pulses: Normal pulses.  Pulmonary:     Effort: Pulmonary effort is normal.     Breath sounds: Normal breath sounds.  Abdominal:     General: Bowel sounds are normal.     Palpations: Abdomen is soft.  Genitourinary:    Comments: Deferred Musculoskeletal: Normal range of motion.  Lymphadenopathy:     Cervical: No cervical adenopathy.  Skin:    General: Skin is warm and dry.  Neurological:     General: No focal deficit present.     Mental Status: She is alert and oriented to person, place, and time.  Psychiatric:        Mood and Affect: Mood normal.        Behavior: Behavior normal.     Data Reviewed I reviewed the cytopathology from her biopsy on 25 March.  As discussed above, there was atypia of undetermined significance with negative thyroseq testing.    I also personally reviewed the CT scan performed on March 31.  The results are copied below: FINDINGS: MULTIPHASE PARATHYROID PROTOCOL FINDINGS  Candidate lesion: 1, best seen on axial series 6 image is 129 and coronal series 9 image 72. Between the origins of the brachiocephalic artery and left common carotid artery from the aortic arch.  Size (mm): 4 AP x 5 TV x 4 CC  Location: Below the level of the cricoid cartilage. Slightly anterior to the tracheoesophageal groove.  Polar vessel: None  Enhancement pattern: B - hypodense to normal thyroid tissue precontrast, arterial phase and venous phase     Assessment This is a 53 year old woman with biochemical evidence of primary hyperparathyroidism.  She has a number of secondary symptoms and due to her young age, warrant surgical intervention.  We will plan for parathyroidectomy.  I did discuss the possibility of a negative exploration, due to the limits of her imaging.  We will utilize intraoperative PTH  liberally to try and optimize our resection.  The risks the operation were discussed with  her.  These include, but are not limited to, bleeding, infection, damage to the recurrent laryngeal nerves, negative exploration as above, persistent or recurrent hyperparathyroidism, postsurgical hypoparathyroidism (temporary or permanent), need for additional surgery or treatment in the future.  All of her questions were answered to her satisfaction.  Plan We will proceed with surgery as scheduled.    Fredirick Maudlin 10/17/2018, 11:44 AM

## 2018-10-22 ENCOUNTER — Telehealth: Payer: Self-pay

## 2018-10-22 ENCOUNTER — Encounter
Admission: RE | Admit: 2018-10-22 | Discharge: 2018-10-22 | Disposition: A | Discharge status: Home / Self Care | Payer: No Typology Code available for payment source | Source: Ambulatory Visit | Attending: General Surgery | Admitting: General Surgery

## 2018-10-22 ENCOUNTER — Other Ambulatory Visit: Payer: Self-pay

## 2018-10-22 DIAGNOSIS — K219 Gastro-esophageal reflux disease without esophagitis: Secondary | ICD-10-CM | POA: Diagnosis not present

## 2018-10-22 DIAGNOSIS — Z01812 Encounter for preprocedural laboratory examination: Secondary | ICD-10-CM | POA: Insufficient documentation

## 2018-10-22 DIAGNOSIS — Z20828 Contact with and (suspected) exposure to other viral communicable diseases: Secondary | ICD-10-CM | POA: Diagnosis not present

## 2018-10-22 DIAGNOSIS — Z1159 Encounter for screening for other viral diseases: Secondary | ICD-10-CM | POA: Insufficient documentation

## 2018-10-22 DIAGNOSIS — G4730 Sleep apnea, unspecified: Secondary | ICD-10-CM | POA: Diagnosis not present

## 2018-10-22 DIAGNOSIS — D649 Anemia, unspecified: Secondary | ICD-10-CM | POA: Diagnosis not present

## 2018-10-22 DIAGNOSIS — E210 Primary hyperparathyroidism: Secondary | ICD-10-CM | POA: Diagnosis present

## 2018-10-22 DIAGNOSIS — J45909 Unspecified asthma, uncomplicated: Secondary | ICD-10-CM | POA: Diagnosis not present

## 2018-10-22 DIAGNOSIS — E119 Type 2 diabetes mellitus without complications: Secondary | ICD-10-CM | POA: Diagnosis not present

## 2018-10-22 DIAGNOSIS — Z79899 Other long term (current) drug therapy: Secondary | ICD-10-CM | POA: Diagnosis not present

## 2018-10-22 HISTORY — DX: Personal history of urinary calculi: Z87.442

## 2018-10-22 HISTORY — DX: Personal history of other diseases of the digestive system: Z87.19

## 2018-10-22 LAB — SARS CORONAVIRUS 2 (TAT 6-24 HRS): SARS Coronavirus 2: NEGATIVE

## 2018-10-22 NOTE — Telephone Encounter (Signed)
FMLA completed. Copy placed at fron t desk. Placed in scan folder. Return to work date 11/15/2018.

## 2018-10-22 NOTE — Patient Instructions (Signed)
Your procedure is scheduled on: Thursday 10/25/18 Report to [facility]. To find out your arrival time please call [phone] between 1PM - 3PM on Wednesday 10/24/18.  Remember: Instructions that are not followed completely may result in serious medical risk, up to and including death, or upon the discretion of your surgeon and anesthesiologist your surgery may need to be rescheduled.     _X__ 1. Do not eat food after midnight the night before your procedure.                 No gum chewing or hard candies. You may drink clear liquids up to 2 hours                 before you are scheduled to arrive for your surgery- DO not drink clear                 liquids within 2 hours of the start of your surgery.                 Clear Liquids include:  water, apple juice without pulp, clear carbohydrate                 drink such as Clearfast or Gatorade, Black Coffee or Tea (Do not add                 anything to coffee or tea). Diabetics water only  __X__2.  On the morning of surgery brush your teeth with toothpaste and water, you                 may rinse your mouth with mouthwash if you wish.  Do not swallow any              toothpaste of mouthwash.     _X__ 3.  No Alcohol for 24 hours before or after surgery.   _X__ 4.  Do Not Smoke or use e-cigarettes For 24 Hours Prior to Your Surgery.                 Do not use any chewable tobacco products for at least 6 hours prior to                 surgery.  ____  5.  Bring all medications with you on the day of surgery if instructed.   __X__  6.  Notify your doctor if there is any change in your medical condition      (cold, fever, infections).     Do not wear jewelry, make-up, hairpins, clips or nail polish. Do not wear lotions, powders, or perfumes.  Do not shave 48 hours prior to surgery. Men may shave face and neck. Do not bring valuables to the hospital.    [facility] is not responsible  for any belongings or valuables.  Contacts, dentures/partials or body piercings may not be worn into surgery. Bring a case for your contacts, glasses or hearing aids, a denture cup will be supplied. Leave your suitcase in the car. After surgery it may be brought to your room. For patients admitted to the hospital, discharge time is determined by your treatment team.   Patients discharged the day of surgery will not be allowed to drive home.   Please read over the following fact sheets that you were given:   MRSA Information  __X__ Take these medicines the morning of surgery with A SIP OF WATER:  1. busPIRone (BUSPAR  2. escitalopram (LEXAPRO)  3. omeprazole (PRILOSEC  4.  5.  6.  ____ Fleet Enema (as directed)   __X__ Use CHG Soap/SAGE wipes as directed  __X__ Use inhalers on the day of surgery  ____ Stop metformin/Janumet/Farxiga 2 days prior to surgery    ____ Take 1/2 of usual insulin dose the night before surgery. No insulin the morning          of surgery.   ____ Stop Blood Thinners Coumadin/Plavix/Xarelto/Pleta/Pradaxa/Eliquis/Effient/Aspirin  on   Or contact your Surgeon, Cardiologist or Medical Doctor regarding  ability to stop your blood thinners  __X__ Stop Anti-inflammatories 7 days before surgery such as Advil, Ibuprofen, Motrin,  BC or Goodies Powder, Naprosyn, Naproxen, Aleve, Aspirin    __X__ Stop all herbal supplements, fish oil or vitamin E until after surgery.    ____ Bring C-Pap to the hospital.

## 2018-10-25 ENCOUNTER — Encounter: Payer: Self-pay | Admitting: *Deleted

## 2018-10-25 ENCOUNTER — Ambulatory Visit: Payer: No Typology Code available for payment source | Admitting: Anesthesiology

## 2018-10-25 ENCOUNTER — Ambulatory Visit
Admission: RE | Admit: 2018-10-25 | Discharge: 2018-10-25 | Disposition: A | Discharge status: Home / Self Care | Payer: No Typology Code available for payment source | Attending: General Surgery | Admitting: General Surgery

## 2018-10-25 ENCOUNTER — Encounter
Admission: RE | Disposition: A | Discharge status: Home / Self Care | Payer: Self-pay | Source: Home / Self Care | Attending: General Surgery

## 2018-10-25 ENCOUNTER — Other Ambulatory Visit: Payer: Self-pay

## 2018-10-25 DIAGNOSIS — G4730 Sleep apnea, unspecified: Secondary | ICD-10-CM | POA: Insufficient documentation

## 2018-10-25 DIAGNOSIS — D649 Anemia, unspecified: Secondary | ICD-10-CM | POA: Insufficient documentation

## 2018-10-25 DIAGNOSIS — E213 Hyperparathyroidism, unspecified: Secondary | ICD-10-CM

## 2018-10-25 DIAGNOSIS — E119 Type 2 diabetes mellitus without complications: Secondary | ICD-10-CM | POA: Insufficient documentation

## 2018-10-25 DIAGNOSIS — K219 Gastro-esophageal reflux disease without esophagitis: Secondary | ICD-10-CM | POA: Insufficient documentation

## 2018-10-25 DIAGNOSIS — E210 Primary hyperparathyroidism: Secondary | ICD-10-CM | POA: Insufficient documentation

## 2018-10-25 DIAGNOSIS — Z79899 Other long term (current) drug therapy: Secondary | ICD-10-CM | POA: Insufficient documentation

## 2018-10-25 DIAGNOSIS — J45909 Unspecified asthma, uncomplicated: Secondary | ICD-10-CM | POA: Insufficient documentation

## 2018-10-25 DIAGNOSIS — Z20828 Contact with and (suspected) exposure to other viral communicable diseases: Secondary | ICD-10-CM | POA: Insufficient documentation

## 2018-10-25 HISTORY — PX: PARATHYROIDECTOMY: SHX19

## 2018-10-25 LAB — PARATHYROID HORMONE, INTRAOP (ARMC ONLY)
Parathyroid Hormone: 61 pg/mL (ref 12–88)
Parathyroid Hormone: 27 pg/mL (ref 12–88)
Parathyroid Hormone: 32 pg/mL (ref 12–88)
Parathyroid Hormone: 38 pg/mL (ref 12–88)

## 2018-10-25 LAB — GLUCOSE, CAPILLARY
Glucose-Capillary: 94 mg/dL (ref 70–99)
Glucose-Capillary: 124 mg/dL — ABNORMAL HIGH (ref 70–99)

## 2018-10-25 SURGERY — PARATHYROIDECTOMY
Anesthesia: General

## 2018-10-25 MED ORDER — ONDANSETRON HCL 4 MG/2ML IJ SOLN
INTRAMUSCULAR | Status: DC | PRN
  Administered 2018-10-25: 4 mg via INTRAVENOUS

## 2018-10-25 MED ORDER — REMIFENTANIL HCL 1 MG IV SOLR
INTRAVENOUS | Status: AC
  Filled 2018-10-25: qty 1000

## 2018-10-25 MED ORDER — HYDROCODONE-ACETAMINOPHEN 5-325 MG PO TABS: 1.0000 | ORAL_TABLET | Freq: Four times a day (QID) | ORAL | 0 refills | Status: DC | PRN

## 2018-10-25 MED ORDER — SODIUM CHLORIDE 0.9 % IV SOLN
Freq: Once | INTRAVENOUS | Status: AC
  Administered 2018-10-25: 07:00:00 via INTRAVENOUS
  Filled 2018-10-25: qty 5

## 2018-10-25 MED ORDER — ROCURONIUM BROMIDE 100 MG/10ML IV SOLN
INTRAVENOUS | Status: DC | PRN
  Administered 2018-10-25: 50 mg via INTRAVENOUS

## 2018-10-25 MED ORDER — KETOROLAC TROMETHAMINE 30 MG/ML IJ SOLN
INTRAMUSCULAR | Status: DC | PRN
  Administered 2018-10-25: 30 mg via INTRAVENOUS

## 2018-10-25 MED ORDER — SUGAMMADEX SODIUM 200 MG/2ML IV SOLN
INTRAVENOUS | Status: DC | PRN
  Administered 2018-10-25: 200 mg via INTRAVENOUS

## 2018-10-25 MED ORDER — LIDOCAINE HCL (PF) 2 % IJ SOLN
INTRAMUSCULAR | Status: AC
  Filled 2018-10-25: qty 10

## 2018-10-25 MED ORDER — CELECOXIB 200 MG PO CAPS
ORAL_CAPSULE | ORAL | Status: AC
  Administered 2018-10-25: 200 mg via ORAL
  Filled 2018-10-25: qty 1

## 2018-10-25 MED ORDER — ACETAMINOPHEN 500 MG PO TABS
ORAL_TABLET | ORAL | Status: AC
  Administered 2018-10-25: 07:00:00 1000 mg via ORAL
  Filled 2018-10-25: qty 2

## 2018-10-25 MED ORDER — ONDANSETRON HCL 4 MG/2ML IJ SOLN: 4.0000 mg | Freq: Once | INTRAMUSCULAR | Status: DC | PRN

## 2018-10-25 MED ORDER — SUCCINYLCHOLINE CHLORIDE 20 MG/ML IJ SOLN
INTRAMUSCULAR | Status: AC
  Filled 2018-10-25: qty 1

## 2018-10-25 MED ORDER — MIDAZOLAM HCL 2 MG/2ML IJ SOLN
INTRAMUSCULAR | Status: AC
  Filled 2018-10-25: qty 2

## 2018-10-25 MED ORDER — MIDAZOLAM HCL 2 MG/2ML IJ SOLN
INTRAMUSCULAR | Status: DC | PRN
  Administered 2018-10-25: 2 mg via INTRAVENOUS

## 2018-10-25 MED ORDER — CHLORHEXIDINE GLUCONATE CLOTH 2 % EX PADS: 6.0000 | MEDICATED_PAD | Freq: Once | CUTANEOUS | Status: DC

## 2018-10-25 MED ORDER — IBUPROFEN 800 MG PO TABS: 800.0000 mg | ORAL_TABLET | Freq: Three times a day (TID) | ORAL | 0 refills | Status: DC | PRN

## 2018-10-25 MED ORDER — GABAPENTIN 300 MG PO CAPS
ORAL_CAPSULE | ORAL | Status: AC
  Administered 2018-10-25: 07:00:00 300 mg via ORAL
  Filled 2018-10-25: qty 1

## 2018-10-25 MED ORDER — PHENYLEPHRINE HCL (PRESSORS) 10 MG/ML IV SOLN
INTRAVENOUS | Status: DC | PRN
  Administered 2018-10-25 (×12): 200 ug via INTRAVENOUS

## 2018-10-25 MED ORDER — FENTANYL CITRATE (PF) 100 MCG/2ML IJ SOLN
INTRAMUSCULAR | Status: AC
  Filled 2018-10-25: qty 2

## 2018-10-25 MED ORDER — PROPOFOL 10 MG/ML IV BOLUS
INTRAVENOUS | Status: AC
  Filled 2018-10-25: qty 60

## 2018-10-25 MED ORDER — SODIUM CHLORIDE 0.9 % IV SOLN
INTRAVENOUS | Status: DC
  Administered 2018-10-25: 07:00:00 via INTRAVENOUS

## 2018-10-25 MED ORDER — GABAPENTIN 300 MG PO CAPS
300.0000 mg | ORAL_CAPSULE | ORAL | Status: AC
  Administered 2018-10-25: 300 mg via ORAL

## 2018-10-25 MED ORDER — PROPOFOL 10 MG/ML IV BOLUS
INTRAVENOUS | Status: DC | PRN
  Administered 2018-10-25: 200 mg via INTRAVENOUS

## 2018-10-25 MED ORDER — ACETAMINOPHEN 500 MG PO TABS
1000.0000 mg | ORAL_TABLET | ORAL | Status: AC
  Administered 2018-10-25: 1000 mg via ORAL

## 2018-10-25 MED ORDER — FENTANYL CITRATE (PF) 100 MCG/2ML IJ SOLN: 25.0000 ug | INTRAMUSCULAR | Status: DC | PRN

## 2018-10-25 MED ORDER — CELECOXIB 200 MG PO CAPS
200.0000 mg | ORAL_CAPSULE | ORAL | Status: AC
  Administered 2018-10-25: 200 mg via ORAL

## 2018-10-25 MED ORDER — LIDOCAINE HCL (CARDIAC) PF 100 MG/5ML IV SOSY
PREFILLED_SYRINGE | INTRAVENOUS | Status: DC | PRN
  Administered 2018-10-25: 100 mg via INTRAVENOUS

## 2018-10-25 MED ORDER — CHLORHEXIDINE GLUCONATE CLOTH 2 % EX PADS
6.0000 | MEDICATED_PAD | Freq: Once | CUTANEOUS | Status: AC
  Administered 2018-10-25: 07:00:00 6 via TOPICAL

## 2018-10-25 MED ORDER — FENTANYL CITRATE (PF) 100 MCG/2ML IJ SOLN
INTRAMUSCULAR | Status: DC | PRN
  Administered 2018-10-25: 100 ug via INTRAVENOUS

## 2018-10-25 SURGICAL SUPPLY — 47 items
ADH SKN CLS APL DERMABOND .7 (GAUZE/BANDAGES/DRESSINGS) ×1
BACTOSHIELD CHG 4% 4OZ (MISCELLANEOUS) ×1
BASIN GRAD PLASTIC 32OZ STRL (MISCELLANEOUS) ×2 IMPLANT
BLADE SURG 15 STRL LF DISP TIS (BLADE) ×1 IMPLANT
BLADE SURG 15 STRL SS (BLADE) ×2
CANISTER SUCT 1200ML W/VALVE (MISCELLANEOUS) ×2 IMPLANT
CLIP VESOCCLUDE SM WIDE 6/CT (CLIP) ×2 IMPLANT
CNTNR SPEC 2.5X3XGRAD LEK (MISCELLANEOUS) ×4
CONT SPEC 4OZ STER OR WHT (MISCELLANEOUS) ×4
CONT SPEC 4OZ STRL OR WHT (MISCELLANEOUS) ×4
CONTAINER SPEC 2.5X3XGRAD LEK (MISCELLANEOUS) IMPLANT
COVER WAND RF STERILE (DRAPES) ×2 IMPLANT
DERMABOND ADVANCED (GAUZE/BANDAGES/DRESSINGS) ×1
DERMABOND ADVANCED .7 DNX12 (GAUZE/BANDAGES/DRESSINGS) ×1 IMPLANT
DRAPE C-ARM XRAY 36X54 (DRAPES) ×1 IMPLANT
DRAPE MAG INST 16X20 L/F (DRAPES) ×1 IMPLANT
DRAPE MICROSCOPE SPINE 48X150 (DRAPES) ×1 IMPLANT
ELECT CAUTERY BLADE TIP 2.5 (TIP) ×2
ELECT REM PT RETURN 9FT ADLT (ELECTROSURGICAL) ×2
ELECTRODE CAUTERY BLDE TIP 2.5 (TIP) ×1 IMPLANT
ELECTRODE REM PT RTRN 9FT ADLT (ELECTROSURGICAL) ×1 IMPLANT
EVICEL AIRLESS SPRAY ACCES (MISCELLANEOUS) IMPLANT
GAUZE 4X4 16PLY RFD (DISPOSABLE) IMPLANT
GLOVE BIO SURGEON STRL SZ 6.5 (GLOVE) ×2 IMPLANT
GLOVE INDICATOR 7.0 STRL GRN (GLOVE) ×4 IMPLANT
GOWN STRL REUS W/ TWL LRG LVL3 (GOWN DISPOSABLE) ×2 IMPLANT
GOWN STRL REUS W/TWL LRG LVL3 (GOWN DISPOSABLE) ×4
KIT TURNOVER KIT A (KITS) ×2 IMPLANT
LABEL OR SOLS (LABEL) ×2 IMPLANT
NDL HYPO 25X1 1.5 SAFETY (NEEDLE) ×4 IMPLANT
NDL SAFETY ECLIPSE 18X1.5 (NEEDLE) ×4 IMPLANT
NEEDLE HYPO 18GX1.5 SHARP (NEEDLE) ×8
NEEDLE HYPO 25X1 1.5 SAFETY (NEEDLE) ×8 IMPLANT
NS IRRIG 500ML POUR BTL (IV SOLUTION) ×2 IMPLANT
PACK HEAD/NECK (MISCELLANEOUS) ×2 IMPLANT
SCRUB CHG 4% DYNA-HEX 4OZ (MISCELLANEOUS) ×1 IMPLANT
SHEARS HARMONIC 9CM CVD (BLADE) IMPLANT
SPONGE KITTNER 5P (MISCELLANEOUS) ×2 IMPLANT
STRIP CLOSURE SKIN 1/2X4 (GAUZE/BANDAGES/DRESSINGS) ×2 IMPLANT
SURGICEL SNOW 2X4 (HEMOSTASIS) ×2 IMPLANT
SUT MNCRL AB 4-0 PS2 18 (SUTURE) IMPLANT
SUT PROLENE 4 0 PS 2 18 (SUTURE) ×2 IMPLANT
SUT SILK 2 0 (SUTURE) ×2
SUT SILK 2-0 18XBRD TIE 12 (SUTURE) ×1 IMPLANT
SUT VIC AB 4-0 RB1 27 (SUTURE) ×2
SUT VIC AB 4-0 RB1 27X BRD (SUTURE) ×1 IMPLANT
SYR 3ML LL SCALE MARK (SYRINGE) ×8 IMPLANT

## 2018-10-25 NOTE — Anesthesia Preprocedure Evaluation (Addendum)
Anesthesia Evaluation  Patient identified by MRN, date of birth, ID band Patient awake    Reviewed: Allergy & Precautions, NPO status , Patient's Chart, lab work & pertinent test results  History of Anesthesia Complications (+) PONV and history of anesthetic complications  Airway Mallampati: III       Dental   Pulmonary asthma , sleep apnea (not using CPAP) ,           Cardiovascular (-) hypertension(-) Past MI and (-) CHF (-) dysrhythmias (-) Valvular Problems/Murmurs     Neuro/Psych neg Seizures    GI/Hepatic Neg liver ROS, hiatal hernia, GERD  Medicated and Poorly Controlled,  Endo/Other  diabetes, Type 2, Oral Hypoglycemic Agents  Renal/GU negative Renal ROS     Musculoskeletal   Abdominal   Peds  Hematology   Anesthesia Other Findings   Reproductive/Obstetrics                           Anesthesia Physical Anesthesia Plan  ASA: III  Anesthesia Plan: General   Post-op Pain Management:    Induction: Intravenous  PONV Risk Score and Plan: 4 or greater and Dexamethasone, Ondansetron, Midazolam and Treatment may vary due to age or medical condition  Airway Management Planned: Oral ETT  Additional Equipment:   Intra-op Plan:   Post-operative Plan:   Informed Consent: I have reviewed the patients History and Physical, chart, labs and discussed the procedure including the risks, benefits and alternatives for the proposed anesthesia with the patient or authorized representative who has indicated his/her understanding and acceptance.       Plan Discussed with:   Anesthesia Plan Comments:         Anesthesia Quick Evaluation

## 2018-10-25 NOTE — Anesthesia Postprocedure Evaluation (Signed)
Anesthesia Post Note  Patient: Ashley [name]  Procedure(s) Performed: PARATHYROIDECTOMY, DIABETIC, SLEEP APNEA (N/A )  Patient location during evaluation: PACU Anesthesia Type: General Level of consciousness: awake and alert Pain management: pain level controlled Vital Signs Assessment: post-procedure vital signs reviewed and stable Respiratory status: spontaneous breathing, nonlabored ventilation, respiratory function stable and patient connected to nasal cannula oxygen Cardiovascular status: blood pressure returned to baseline and stable Postop Assessment: no apparent nausea or vomiting Anesthetic complications: no     Last Vitals:  Vitals:   10/25/18 1040 10/25/18 1153  BP: 109/68 98/61  Pulse: 72 65  Resp: 16 16  Temp: (!) 36.3 C   SpO2: 99% 98%    Last Pain:  Vitals:   10/25/18 1153  TempSrc:   PainSc: 2                  Matina Rodier S

## 2018-10-25 NOTE — Discharge Instructions (Signed)
Post-operative Home Care After Thyroid or Parathyroid Surgery  What should I expect after my operation?    You will see swelling under the incision and/or bruising under it in a few days. This is usually greatest on the second or third day after the operation. You may also feel the sensation of swelling and/or of firmness that can last for a month or more.     Neck incisions heal rapidly, within a week or two. You can get them damp after about 24 hours, however do not submerge the incision or allow it to become soaked or saturated with water or sweat for 2 weeks.    Your scar will be most visible 1-2 months after the operation and gradually fade over the next 6-8 months. As it heals, a scar looks more pink or red than the skin around it.    You may feel a firm healing ridge directly under the incision. This is normal and will soften and go away when healing is complete within 3-6 months.    All incisions are sensitive to sunlight.  For one year after surgery, you should use sunscreen when outdoors for long periods to prevent darkening of the scar area.     We recommend that you not expose the incision to the ultraviolet lights used in tanning booths.    Will my neck hurt?   Most patients experience very little pain, but you may feel some neck stiffness/soreness in your shoulder, back or neck and tension headache that takes a few days or weeks to go away completely.     Some patients also notice minor changes in swallowing, which improve over time.    The skin just above and below your incision will feel numb. This will improve over several months, but some persons may have a longterm decrease in sensation.     You may apply cold pack over your incision to improve the pain.    How will I manage my pain at home?   Take NSAIDS like ibuprofen (Motrin, Advil), naproxen (Naprosyn, Aleve) or acetaminophen (Tylenol) every 6 hours for the first 3-5 days after operation. This will  minimize the pain you feel.      ? To prevent Tylenol overdose, do not take a Tylenol doses at the same time as a combination narcotic dose that contains Tylenol, like Vicodin and Percocet. However, You may take them 4-6 hours apart.      If you have sore/stiff muscles in your back, shoulder or neck, you may use moist warm heat or heating pad on the affected areas 15-20 minutes at a time several times a day.    Gently massaging your neck muscles will improve the neck stiffness.     Do not be afraid to move your neck. Gently flexing and stretching your neck muscles will prevent stiffness.     Stronger pain medication or narcotic (like Vicodin, Percocet) for severe pain is rarely needed. If it is, however, DO NOT drive a car or drink alcohol while taking these medications.     Narcotics also cause constipation. Stool softeners (Colace) and fiber (fruits, bran, vegetables) and extra fluid intake helps. A stimulant laxative (Milk of Magnesia, Senokot) may be needed as well.    Will my voice be affected?  Your voice may be hoarse or weak at first, because the surgery took place near the voice box, but usually recovers within weeks. Some patients also notice a change in the pitch of their voices that affects  singing. Rarely, these changes can be permanent.   Are there any diet restrictions?  No, return to your previous diet and always eat a well balanced diet, low in fat, etc.    How will I care for my incision?   If you have paper steri strips on your incision, leave them in place until they begin to fall off naturally. If they become discolored or messy, you may remove them 7-10 days after your operation.    If you have a skin glue (Dermabond) closure, you may notice tiny pieces of yellow material on your washcloth. Any sutures (stitches) you may have are dissolvable and do not need to be removed.   You may shower then gently pat dry your incision.    Do not apply ointments or  powders.    Avoid using Vitamin E cream or other moisturizers on the incision until after your first follow-up visit.   What new medications might I take home?   ? Calcium supplement:  Your bodys calcium levels may fall after a total thyroidectomy or parathyroid operation. We recommend you purchase Os-Cal 500 (one tablet equals 500 mg of elemental calcium) or Citracal (2 tablets equal 630 mg of elemental calcium; the "Petites" version contains 500 mg elemental calcium per 2 tablets). You may be taking 3-6 (or more) tablets per day, depending on your doctors recommendation. You will need to take calcium at different times to avoid medication interaction. Ask your pharmacists, nurse, or doctor about specific interactions.   ? Thyroid Hormone:  If you have had a thyroid operation, you may be prescribed thyroid hormone replacement, called levothyroxine (Synthroid, Levothroid, etc.). A blood test will be done in 6-8 weeks to ensure the dosage is correct  by your doctor or your surgeon.   ? Vitamin D:  If your doctor has prescribed a Vitamin D supplement, like Calcitriol (Rocaltrol), try to get it filled at the hospital pharmacy before you leave.  Sometimes regular pharmacies do not stock it and will need to order it in.  When can I go back to normal activities?   You may return to work in 5-7 days or sooner if desired. Contact the clinic coordinator if you need employer forms completed.    You may drive as long as you are not taking any narcotics and your neck stiffness is resolved    Can I resume my previous medications?    Yes, unless directed not to by your doctor.    Before discharge, be sure to review your previous medications with your doctor or inpatient medical team.    When do I call for advice?   - If your temperature > 101.5  - You have trouble talking or breathing  - Your fingers/ hands or face or around your lips becomes numb and tingling. (This may mean your calcium level is  low.)  Chew 2 Tums every 30 minutes until it goes away, but please contact our office so that we may adjust your calcium supplementation. - You have trouble swallowing  - Your incision becomes swollen, red or drainage occurs.      Please start taking 1 tablets of either Os-Cal 500 or Citrical 2 times daily. You may use Tums, instead, but please be sure to get the "ultra" variety.  Calcium can be quite constipating, so be sure to drink at least 64 oz of water or other non-caffeinated beverage daily. You can still drink caffeine in addition to this.  If necessary, you may use  over the counter stool softeners or fiber supplements to help with constipation.  You may continue taking your home vitamin D supplement.   AMBULATORY SURGERY  DISCHARGE INSTRUCTIONS   1) The drugs that you were given will stay in your system until tomorrow so for the next 24 hours you should not:  A) Drive an automobile B) Make any legal decisions C) Drink any alcoholic beverage   2) You may resume regular meals tomorrow.  Today it is better to start with liquids and gradually work up to solid foods.  You may eat anything you prefer, but it is better to start with liquids, then soup and crackers, and gradually work up to solid foods.   3) Please notify your doctor immediately if you have any unusual bleeding, trouble breathing, redness and pain at the surgery site, drainage, fever, or pain not relieved by medication.    4) Additional Instructions:        Please contact your physician with any problems or Same Day Surgery at [phone], Monday through Friday 6 am to 4 pm, or [place] at [facility] Main number at [phone]. .Marland Kitchen

## 2018-10-25 NOTE — Anesthesia Procedure Notes (Signed)
Procedure Name: Intubation Date/Time: 10/25/2018 7:39 AM Performed by: Leander Rams, CRNA Pre-anesthesia Checklist: Patient identified, Emergency Drugs available, Suction available, Patient being monitored and Timeout performed Patient Re-evaluated:Patient Re-evaluated prior to induction Oxygen Delivery Method: Circle system utilized Preoxygenation: Pre-oxygenation with 100% oxygen Induction Type: IV induction Ventilation: Mask ventilation without difficulty Laryngoscope Size: McGraph and 3 Grade View: Grade I Tube type: Oral Tube size: 7.0 mm Number of attempts: 1 Airway Equipment and Method: Stylet Placement Confirmation: ETT inserted through vocal cords under direct vision,  CO2 detector,  positive ETCO2 and breath sounds checked- equal and bilateral Secured at: 20 cm Tube secured with: Tape Dental Injury: Teeth and Oropharynx as per pre-operative assessment

## 2018-10-25 NOTE — Transfer of Care (Signed)
Immediate Anesthesia Transfer of Care Note  Patient: Ashley [name]  Procedure(s) Performed: PARATHYROIDECTOMY, DIABETIC, SLEEP APNEA (N/A )  Patient Location: PACU  Anesthesia Type:General  Level of Consciousness: awake  Airway & Oxygen Therapy: Patient Spontanous Breathing  Post-op Assessment: Report given to RN  Post vital signs: stable  Last Vitals:  Vitals Value Taken Time  BP 109/66 10/25/18 0945  Temp 36.1 C 10/25/18 0945  Pulse 75 10/25/18 0948  Resp 15 10/25/18 0948  SpO2 100 % 10/25/18 0948  Vitals shown include unvalidated device data.  Last Pain:  Vitals:   10/25/18 0646  TempSrc: Oral  PainSc: 0-No pain         Complications: No apparent anesthesia complications

## 2018-10-25 NOTE — Op Note (Signed)
Operative Note  Preoperative Diagnosis:  primary hyperparathyroidism  Postoperative Diagnosis:  primary hyperparathyroidism due to multigland hyperplasia  Operation:  parathyroidectomy (4-gland exploration) with removal of less than 4 glands  Surgeon: Fredirick Maudlin, MD  Assistant: Loel Dubonnet, PA-C  Anesthesia: General endotracheal  Findings: All 4 parathyroid glands were identified.  None of them appeared particularly abnormal.  A 4 gland exploration was performed.  All 4 parathyroid glands were identified.  I selected the right superior for preservation.  It was marked with 2 ligaclips.  The remaining 3 were removed and confirmed by frozen section.  Intraoperative PTH levels are as follows: Baseline: 61 Pre-excision: 38 5-minute: 32 10-minute: 26  Indications: Ashley [name] is a 53 year old woman with biochemical evidence of primary hyperparathyroidism.  She has a number of secondary symptoms and desired surgical treatment.  Based upon her young age, she met criteria.  Preoperative imaging was nonlocalizing.  Procedure In Detail: The patient was identified in the preoperative holding area and brought to the operating room where she was placed supine on the OR table.  All bony prominences were padded and bilateral sequential compression devices were placed on the lower extremities.  General endotracheal anesthesia was induced without incident.  She was positioned appropriately for the operation and sterilely prepped and draped in standard fashion.  A timeout was performed confirming her identity, the procedure being performed, her allergies, all necessary equipment was available, and that maintenance anesthesia was adequate.  A 5 cm transverse incision was made in a natural skin fold that was appropriately positioned for the operation.  This was carried down through the subcutaneous tissues and platysma using electrocautery.  Subplatysmal flaps were elevated, and the strap  muscles were divided in the median raphae.  We elevated the sternohyoid off of the sternothyroid on the left to expose the internal jugular vein.  A baseline PTH level was drawn and sent to the lab.  We then elevated the strap muscles off of the thyroid tissue.  I explored the inferior pole, as this is been the only site of potential localization seen on our imaging studies.  I identified a relatively normal, although perhaps slightly full-appearing inferior parathyroid gland.  Based upon its appearance, I felt that multi-gland hyperplasia was the most likely diagnosis.  Leaving it in place, I explored the superior pole and identified a similar-appearing parathyroid gland in this location.  I elected to explore the right side.  Both parathyroid glands were identified, having a similar appearance of those on the left.  After evaluating all 4 glands, I selected the right superior as the gland to be left in situ.  I marked it with 2 small ligaclips.  I then sequentially resected the remaining 3 glands, carefully dissecting them free from their surrounding tissues, elevating them on their vascular pedicles, and then dividing the pedicle.  Each gland was biopsied for frozen section and placed in cold saline for potential autotransplantation at the end of the case.  After the third gland was removed, I drew a PTH level.  5 and 10 minutes post excision, additional PTH levels were drawn and sent to the lab.  We received reports back from the pathology department that all tissue submitted was confirmed to be parathyroid.  We irrigated the wound beds and obtain good hemostasis.  Valsalva maneuvers from the anesthesia team confirmed no ongoing surgical bleeding.  We applied snow for additional hemostasis.  The strap muscles were closed in the midline with running 4-0 Vicryl and the  platysma was closed with interrupted Vicryl.  The skin was closed with running subcuticular Prolene.  We awaited the results for PTH levels.  These  returned as discussed under findings, confirming adequate excision of all hypersecretory tissue.  The skin was cleaned, and Dermabond and Steri-Strips were applied.  The Prolene suture was removed.  The patient was awakened, extubated, and taken to the postanesthesia care unit in good condition.    EBL: Less than 5 cc  IVF: Please see anesthesia record  Specimen(s): Left superior, left inferior, and right inferior parathyroid glands to pathology for permanent section  Complications: none immediately apparent.   Counts: all needles, instruments, and sponges were counted and reported to be correct in number at the end of the case.   I was present for and participated in the entire operation.  Fredirick Maudlin 9:58 AM

## 2018-10-25 NOTE — Anesthesia Post-op Follow-up Note (Signed)
Anesthesia QCDR form completed.        

## 2018-10-25 NOTE — Interval H&P Note (Signed)
History and Physical Interval Note:  10/25/2018 7:18 AM  Ashley [name]  has presented today for surgery, with the diagnosis of HYPERPARATHYROIDISM.  The various methods of treatment have been discussed with the patient and family. After consideration of risks, benefits and other options for treatment, the patient has consented to  Procedure(s): PARATHYROIDECTOMY, DIABETIC, SLEEP APNEA (N/A) as a surgical intervention.  The patient's history has been reviewed, patient examined, no change in status, stable for surgery.  I have reviewed the patient's chart and labs.  Questions were answered to the patient's satisfaction.     Fredirick Maudlin

## 2018-10-26 LAB — SURGICAL PATHOLOGY

## 2018-11-08 ENCOUNTER — Other Ambulatory Visit: Payer: Self-pay

## 2018-11-08 ENCOUNTER — Ambulatory Visit (INDEPENDENT_AMBULATORY_CARE_PROVIDER_SITE_OTHER): Payer: No Typology Code available for payment source | Admitting: General Surgery

## 2018-11-08 VITALS — BP 118/80 | HR 91 | Temp 97.5°F | Ht 67.0 in | Wt 243.0 lb

## 2018-11-08 DIAGNOSIS — E892 Postprocedural hypoparathyroidism: Secondary | ICD-10-CM

## 2018-11-08 DIAGNOSIS — Z9889 Other specified postprocedural states: Secondary | ICD-10-CM

## 2018-11-08 NOTE — Patient Instructions (Addendum)
Return as needed. The patient is aware to call back for any questions or concerns. Warm compress 4 times daily.Wear sun screen. Calcium one time daily than after that week stop it.

## 2018-11-08 NOTE — Progress Notes (Signed)
Ashley [name] is here today for a postop visit after undergoing parathyroidectomy.  She had multi-gland hyperplasia and thus 3 glands were removed.  Her parathyroid hormone level dropped appropriately during the surgery, from a baseline of 61 to a 10-minute post-excision level of 27.  Overall, she has done well since her surgery.  She had a small amount of tingling in her fingertips for a day or so after surgery.  This responded well to Tums.  She has not had any tingling since.  Her voice and swallowing are normal.  She has noticed some swelling, tenderness, and a knotty sensation in her right forearm, starting at the insertion site of her peripheral IV and running up to about mid elbow.  She has not had any fevers or chills.  She has been taking 1 tablet of Os-Cal twice daily since her operation.  Today's Vitals   11/08/18 1124  BP: 118/80  Pulse: 91  Temp: (!) 97.5 F (36.4 C)  TempSrc: Skin  SpO2: 98%  Weight: 243 lb (110.2 kg)  Height: 5\' 7"  (1.702 m)   Body mass index is 38.06 kg/m. Focused exam: Her transverse cervical incision is well approximated.  There is no erythema, induration, or drainage present.  There is a faint healing ridge beneath the surface.  Her right arm was also examined.  She pointed out the site where her IV had been.  She has swelling and tenderness extending from the site up to about mid forearm.  As I run my fingers along her skin, there are several knots or lumps present.  Impression and plan: Ms. Ashley [name] is doing well from a parathyroidectomy standpoint, however she appears to have superficial thrombophlebitis secondary to her peripheral IV.  She was instructed on scar care today, including massage with an emollient agent to minimize tethering and improve the cosmetic outcome, as well as use of sunscreen for the next year to prevent hyperpigmentation of the site.  She may decrease her calcium to once a day for 1 week.  If she has no further  symptoms, she may discontinue this completely.  As far as her superficial thrombophlebitis is concerned, I have recommended that she apply a moist warm heating pad 4 times daily and take a nonsteroidal anti-inflammatory as needed for discomfort. If she has worsening of her pain or swelling, we may need to get an upper extremity DVT scan.  She will contact us if this occurs.  Otherwise, we will see her on an as-needed basis.

## 2018-11-13 ENCOUNTER — Telehealth: Payer: Self-pay | Admitting: General Surgery

## 2018-11-13 DIAGNOSIS — M79601 Pain in right arm: Secondary | ICD-10-CM

## 2018-11-13 NOTE — Telephone Encounter (Signed)
This is the patient I saw last week.  We should get a DVT scan of her arm.  Can you call her and also put in the order?

## 2018-11-13 NOTE — Telephone Encounter (Signed)
Patient is calling she had surgery on 10/25/2018 with Dr Celine Ahr Patient said she is still having some uncomfortable pain ,she has swelling in middle finger, and another finger said she has tightness in the hand, has some numb feeling all but the pinky finger  that has pain that runs up through  the arm. Please call patient and advise

## 2018-11-13 NOTE — Telephone Encounter (Signed)
[place] scheduled at [facility] on 11/16/2018 @ 300pm

## 2018-11-14 ENCOUNTER — Telehealth: Payer: Self-pay | Admitting: *Deleted

## 2018-11-14 ENCOUNTER — Other Ambulatory Visit: Payer: Self-pay

## 2018-11-14 ENCOUNTER — Ambulatory Visit
Admission: RE | Admit: 2018-11-14 | Discharge: 2018-11-14 | Disposition: A | Discharge status: Home / Self Care | Payer: No Typology Code available for payment source | Source: Ambulatory Visit | Attending: General Surgery | Admitting: General Surgery

## 2018-11-14 ENCOUNTER — Telehealth: Payer: Self-pay | Admitting: General Surgery

## 2018-11-14 DIAGNOSIS — M79601 Pain in right arm: Secondary | ICD-10-CM | POA: Diagnosis not present

## 2018-11-14 NOTE — Telephone Encounter (Signed)
Patient got her right arm ultrasound done today. She just wanted [place] to know.

## 2018-11-14 NOTE — Telephone Encounter (Signed)
Spoke w/pt regarding RUE DVT scan.  No DVT present, just superficial thrombophlebitis. Continue warm compresses, elevation, and NSAIDs.

## 2018-11-14 NOTE — Telephone Encounter (Signed)
Thanks. I've spoken with her.

## 2018-11-16 ENCOUNTER — Ambulatory Visit: Admission: RE | Admit: 2018-11-16 | Payer: No Typology Code available for payment source | Source: Ambulatory Visit

## 2018-11-21 ENCOUNTER — Other Ambulatory Visit: Payer: Self-pay | Admitting: Internal Medicine

## 2018-11-21 DIAGNOSIS — Z1231 Encounter for screening mammogram for malignant neoplasm of breast: Secondary | ICD-10-CM

## 2018-12-11 ENCOUNTER — Other Ambulatory Visit: Payer: Self-pay

## 2018-12-11 ENCOUNTER — Ambulatory Visit
Admission: RE | Admit: 2018-12-11 | Discharge: 2018-12-11 | Disposition: A | Discharge status: Home / Self Care | Payer: No Typology Code available for payment source | Source: Ambulatory Visit | Attending: Internal Medicine | Admitting: Internal Medicine

## 2018-12-11 DIAGNOSIS — Z1231 Encounter for screening mammogram for malignant neoplasm of breast: Secondary | ICD-10-CM | POA: Diagnosis present

## 2019-06-27 ENCOUNTER — Other Ambulatory Visit: Payer: Self-pay | Admitting: Internal Medicine

## 2019-07-26 ENCOUNTER — Other Ambulatory Visit: Payer: Self-pay | Admitting: Internal Medicine

## 2019-10-24 ENCOUNTER — Other Ambulatory Visit: Payer: Self-pay | Admitting: Internal Medicine

## 2019-11-24 ENCOUNTER — Other Ambulatory Visit: Payer: Self-pay | Admitting: "Endocrinology

## 2019-12-20 ENCOUNTER — Other Ambulatory Visit: Payer: Self-pay | Admitting: Internal Medicine

## 2019-12-26 ENCOUNTER — Other Ambulatory Visit: Payer: Self-pay | Admitting: Internal Medicine

## 2020-01-13 ENCOUNTER — Other Ambulatory Visit: Payer: Self-pay | Admitting: Internal Medicine

## 2020-04-01 ENCOUNTER — Other Ambulatory Visit: Payer: Self-pay | Admitting: Dermatology

## 2020-04-01 ENCOUNTER — Ambulatory Visit (INDEPENDENT_AMBULATORY_CARE_PROVIDER_SITE_OTHER): Payer: No Typology Code available for payment source | Admitting: Dermatology

## 2020-04-01 ENCOUNTER — Encounter: Payer: Self-pay | Admitting: Dermatology

## 2020-04-01 ENCOUNTER — Other Ambulatory Visit: Payer: Self-pay

## 2020-04-01 DIAGNOSIS — M67441 Ganglion, right hand: Secondary | ICD-10-CM | POA: Diagnosis not present

## 2020-04-01 DIAGNOSIS — L232 Allergic contact dermatitis due to cosmetics: Secondary | ICD-10-CM | POA: Diagnosis not present

## 2020-04-01 DIAGNOSIS — M67449 Ganglion, unspecified hand: Secondary | ICD-10-CM

## 2020-04-01 MED ORDER — MOMETASONE FUROATE 0.1 % EX OINT: TOPICAL_OINTMENT | CUTANEOUS | 1 refills | Status: DC

## 2020-04-01 NOTE — Progress Notes (Unsigned)
   New Patient Visit  Subjective  Ashley [name] is a 56 y.o. female who presents for the following: Skin Problem (Had artificial nails put on 2 years ago. Few days later fingers and nails were painful and peeling. Nails are now darker. Cuticles are thicker. Only uses gel polish now, cuticles and fingers start peeling after a few days and becomes sore. ).  Objective  Well appearing patient in no apparent distress; mood and affect are within normal limits.  Review of Systems: No other skin or systemic complaints except as noted in HPI or Assessment and Plan.  A focused examination was performed including face, hands. Relevant physical exam findings are noted in the Assessment and Plan.  Objective  Right Distal 2nd Finger at proximal nail fold: Solitary, smooth skin colored to papule.   Objective  Right Dorsal Mid 4th Finger: With dyschromia. Secondary to nail care products.   Discoloration at all fingers.   Images      Assessment & Plan  Digital mucinous cyst Right Distal 2nd Finger at proximal nail fold Benign, observe.    Discussed IL steroid injections or IL Asclera at next visit, per patient preference.  Allergic contact dermatitis due to nail procedures - most likely UV cured nail polish Right Dorsal Mid 4th Finger Discontinue all nail products. Start Mometasone oint BID for 2 weeks then QOHS   Avoid applying any type of polish or hardener to nails.  mometasone (ELOCON) 0.1 % ointment - Right Dorsal Mid 4th Finger  Return in about 2 months (around 05/30/2020) for nail recheck.   I, Lawson Radar, CMA, am acting as scribe for Armida Sans, MD. Documentation: I have reviewed the above documentation for accuracy and completeness, and I agree with the above.  Armida Sans, MD

## 2020-04-01 NOTE — Patient Instructions (Signed)
Topical steroids (such as triamcinolone, fluocinolone, fluocinonide, mometasone, clobetasol, halobetasol, betamethasone, hydrocortisone) can cause thinning and lightening of the skin if they are used for too long in the same area. Your physician has selected the right strength medicine for your problem and area affected on the body. Please use your medication only as directed by your physician to prevent side effects.   . 

## 2020-04-02 ENCOUNTER — Encounter: Payer: Self-pay | Admitting: Dermatology

## 2020-04-09 ENCOUNTER — Other Ambulatory Visit: Payer: Self-pay | Admitting: "Endocrinology

## 2020-05-01 ENCOUNTER — Other Ambulatory Visit: Payer: Self-pay | Admitting: Internal Medicine

## 2020-05-01 DIAGNOSIS — R600 Localized edema: Secondary | ICD-10-CM

## 2020-05-07 ENCOUNTER — Other Ambulatory Visit: Payer: Self-pay

## 2020-05-07 ENCOUNTER — Ambulatory Visit
Admission: RE | Admit: 2020-05-07 | Discharge: 2020-05-07 | Disposition: A | Discharge status: Home / Self Care | Payer: No Typology Code available for payment source | Source: Ambulatory Visit | Attending: Internal Medicine | Admitting: Internal Medicine

## 2020-05-07 DIAGNOSIS — R600 Localized edema: Secondary | ICD-10-CM | POA: Insufficient documentation

## 2020-05-08 ENCOUNTER — Other Ambulatory Visit: Payer: Self-pay | Admitting: Student

## 2020-05-11 ENCOUNTER — Other Ambulatory Visit: Payer: Self-pay | Admitting: Student

## 2020-06-03 ENCOUNTER — Ambulatory Visit: Payer: No Typology Code available for payment source | Admitting: Dermatology

## 2020-06-17 ENCOUNTER — Ambulatory Visit (INDEPENDENT_AMBULATORY_CARE_PROVIDER_SITE_OTHER): Payer: No Typology Code available for payment source | Admitting: Dermatology

## 2020-06-17 ENCOUNTER — Other Ambulatory Visit: Payer: Self-pay

## 2020-06-17 DIAGNOSIS — M67441 Ganglion, right hand: Secondary | ICD-10-CM

## 2020-06-17 DIAGNOSIS — L2389 Allergic contact dermatitis due to other agents: Secondary | ICD-10-CM

## 2020-06-17 DIAGNOSIS — M67449 Ganglion, unspecified hand: Secondary | ICD-10-CM

## 2020-06-17 NOTE — Progress Notes (Signed)
   Follow-Up Visit   Subjective  Ashley [name] is a 55 y.o. female who presents for the following: allergic contact dermatitis (Of the right dorsal mid 4th finger patient currently using Mometasone 0.1% QD-BID PRN and avoiding UV treated nails ) and digital mucous cyst (Of the Right Distal 2nd Finger at proximal nail fold - patient states that cyst is about the same ).  The following portions of the chart were reviewed this encounter and updated as appropriate:   Tobacco  Allergies  Meds  Problems  Med Hx  Surg Hx  Fam Hx     Review of Systems:  No other skin or systemic complaints except as noted in HPI or Assessment and Plan.  Objective  Well appearing patient in no apparent distress; mood and affect are within normal limits.  A focused examination was performed including the hands. Relevant physical exam findings are noted in the Assessment and Plan.  Objective  Right Dorsal Mid 4th Finger: Pinkness   Objective  R distal 2nd finger at prox nail fold: Cyst   Assessment & Plan  Allergic contact dermatitis due to UV nail polish treatments - avoid this in future. Right Dorsal Mid 4th Finger due to nail procedures - most likely UV cured nail polish Continue Mometasone 0.1% ointment to aa's QD 4-5 days per week and PRN flares and continue to avoid UV cured nail polish.   Digital mucous cyst of finger R distal 2nd finger at prox nail fold Benign-appearing.  Observation.  Call clinic for new or changing lesions.  Recommend daily use of broad spectrum spf 30+ sunscreen to sun-exposed areas.  Previously discuss ILK injections vs Asclera injection if bothersome to patient.   Return in about 2 months (around 08/17/2020).  Maylene Roes, CMA, am acting as scribe for Armida Sans, MD .  Documentation: I have reviewed the above documentation for accuracy and completeness, and I agree with the above.  Armida Sans, MD

## 2020-06-17 NOTE — Patient Instructions (Signed)

## 2020-06-23 ENCOUNTER — Encounter: Payer: Self-pay | Admitting: Dermatology

## 2020-07-06 ENCOUNTER — Other Ambulatory Visit: Payer: Self-pay

## 2020-07-06 MED FILL — Liraglutide (Weight Mngmt) Soln Pen-Inj 18 MG/3ML (6 MG/ML): SUBCUTANEOUS | 90 days supply | Qty: 90 | Fill #0 | Status: CN

## 2020-07-08 ENCOUNTER — Other Ambulatory Visit: Payer: Self-pay

## 2020-07-08 MED FILL — Liraglutide (Weight Mngmt) Soln Pen-Inj 18 MG/3ML (6 MG/ML): SUBCUTANEOUS | 90 days supply | Qty: 45 | Fill #0 | Status: AC

## 2020-07-09 ENCOUNTER — Other Ambulatory Visit: Payer: Self-pay

## 2020-07-14 ENCOUNTER — Other Ambulatory Visit: Payer: Self-pay

## 2020-07-14 ENCOUNTER — Other Ambulatory Visit: Payer: Self-pay | Admitting: Internal Medicine

## 2020-07-15 ENCOUNTER — Other Ambulatory Visit: Payer: Self-pay

## 2020-07-17 ENCOUNTER — Other Ambulatory Visit: Payer: Self-pay

## 2020-07-17 MED ORDER — ESCITALOPRAM OXALATE 10 MG PO TABS
1.0000 | ORAL_TABLET | Freq: Every day | ORAL | 3 refills | Status: DC
  Filled 2020-07-17: qty 90, 90d supply, fill #0
  Filled 2020-10-19: qty 90, 90d supply, fill #1
  Filled 2021-02-04: qty 90, 90d supply, fill #2
  Filled 2021-05-03: qty 90, 90d supply, fill #3

## 2020-07-20 ENCOUNTER — Other Ambulatory Visit: Payer: Self-pay

## 2020-07-31 ENCOUNTER — Other Ambulatory Visit: Payer: Self-pay

## 2020-08-19 ENCOUNTER — Ambulatory Visit: Payer: No Typology Code available for payment source | Admitting: Dermatology

## 2020-08-26 ENCOUNTER — Other Ambulatory Visit: Payer: Self-pay | Admitting: Internal Medicine

## 2020-08-26 DIAGNOSIS — Z1231 Encounter for screening mammogram for malignant neoplasm of breast: Secondary | ICD-10-CM

## 2020-08-31 ENCOUNTER — Other Ambulatory Visit: Payer: Self-pay

## 2020-08-31 ENCOUNTER — Ambulatory Visit
Admission: RE | Admit: 2020-08-31 | Discharge: 2020-08-31 | Disposition: A | Discharge status: Home / Self Care | Payer: No Typology Code available for payment source | Source: Ambulatory Visit | Attending: Internal Medicine | Admitting: Internal Medicine

## 2020-08-31 DIAGNOSIS — Z1231 Encounter for screening mammogram for malignant neoplasm of breast: Secondary | ICD-10-CM | POA: Insufficient documentation

## 2020-09-25 ENCOUNTER — Other Ambulatory Visit: Payer: Self-pay

## 2020-09-29 ENCOUNTER — Other Ambulatory Visit: Payer: Self-pay

## 2020-09-29 MED ORDER — OMEPRAZOLE 40 MG PO CPDR
DELAYED_RELEASE_CAPSULE | ORAL | 1 refills | Status: DC
  Filled 2020-09-29: qty 180, 90d supply, fill #0
  Filled 2021-03-11: qty 180, 90d supply, fill #1

## 2020-09-30 ENCOUNTER — Other Ambulatory Visit: Payer: Self-pay

## 2020-10-01 ENCOUNTER — Other Ambulatory Visit: Payer: Self-pay

## 2020-10-02 ENCOUNTER — Other Ambulatory Visit: Payer: Self-pay

## 2020-10-02 ENCOUNTER — Other Ambulatory Visit (HOSPITAL_COMMUNITY): Payer: Self-pay

## 2020-10-05 ENCOUNTER — Other Ambulatory Visit (HOSPITAL_COMMUNITY): Payer: Self-pay

## 2020-10-05 ENCOUNTER — Other Ambulatory Visit: Payer: Self-pay

## 2020-10-05 MED ORDER — BUDESONIDE-FORMOTEROL FUMARATE 80-4.5 MCG/ACT IN AERO
INHALATION_SPRAY | RESPIRATORY_TRACT | 6 refills | Status: DC
  Filled 2020-10-05: qty 10.2, 30d supply, fill #0

## 2020-10-05 MED ORDER — ALBUTEROL SULFATE HFA 108 (90 BASE) MCG/ACT IN AERS
INHALATION_SPRAY | RESPIRATORY_TRACT | 12 refills | Status: DC
  Filled 2020-10-05: qty 18, 25d supply, fill #0

## 2020-10-06 ENCOUNTER — Other Ambulatory Visit (HOSPITAL_COMMUNITY): Payer: Self-pay

## 2020-10-19 ENCOUNTER — Other Ambulatory Visit: Payer: Self-pay

## 2020-10-19 MED ORDER — BUSPIRONE HCL 7.5 MG PO TABS
7.5000 mg | ORAL_TABLET | Freq: Two times a day (BID) | ORAL | 1 refills | Status: DC | PRN
  Filled 2020-10-19: qty 180, 90d supply, fill #0
  Filled 2021-05-11: qty 180, 90d supply, fill #1

## 2020-11-11 ENCOUNTER — Other Ambulatory Visit: Payer: Self-pay

## 2020-11-11 MED FILL — Liraglutide (Weight Mngmt) Soln Pen-Inj 18 MG/3ML (6 MG/ML): SUBCUTANEOUS | 90 days supply | Qty: 45 | Fill #1 | Status: AC

## 2020-11-12 ENCOUNTER — Other Ambulatory Visit: Payer: Self-pay

## 2020-11-25 ENCOUNTER — Other Ambulatory Visit: Payer: Self-pay

## 2020-11-25 MED ORDER — VITAMIN D (ERGOCALCIFEROL) 1.25 MG (50000 UNIT) PO CAPS
ORAL_CAPSULE | ORAL | 3 refills | Status: DC
  Filled 2020-11-25: qty 13, 90d supply, fill #0
  Filled 2021-02-04: qty 13, 90d supply, fill #1
  Filled 2021-05-11: qty 13, 90d supply, fill #2
  Filled 2021-09-03: qty 13, 90d supply, fill #3

## 2020-12-21 ENCOUNTER — Other Ambulatory Visit: Payer: Self-pay

## 2020-12-21 MED ORDER — FLUTICASONE PROPIONATE 50 MCG/ACT NA SUSP
NASAL | 11 refills | Status: DC
  Filled 2020-12-21: qty 16, 30d supply, fill #0

## 2020-12-23 ENCOUNTER — Ambulatory Visit: Payer: No Typology Code available for payment source | Admitting: Dermatology

## 2020-12-29 ENCOUNTER — Other Ambulatory Visit: Payer: Self-pay

## 2020-12-29 ENCOUNTER — Encounter (INDEPENDENT_AMBULATORY_CARE_PROVIDER_SITE_OTHER): Payer: Self-pay | Admitting: Vascular Surgery

## 2020-12-29 ENCOUNTER — Ambulatory Visit (INDEPENDENT_AMBULATORY_CARE_PROVIDER_SITE_OTHER): Payer: No Typology Code available for payment source | Admitting: Vascular Surgery

## 2020-12-29 DIAGNOSIS — M7989 Other specified soft tissue disorders: Secondary | ICD-10-CM

## 2020-12-29 DIAGNOSIS — E119 Type 2 diabetes mellitus without complications: Secondary | ICD-10-CM | POA: Diagnosis not present

## 2020-12-29 DIAGNOSIS — I890 Lymphedema, not elsewhere classified: Secondary | ICD-10-CM

## 2020-12-29 NOTE — Progress Notes (Signed)
Patient ID: Ashley [name], female   DOB: June 26, 1965, 55 y.o.   MRN: [id number]  Chief Complaint  Patient presents with   New Patient (Initial Visit)    Ref Graciela Husbands ble lymphedema consult    HPI Ashley [name] is a 55 y.o. female.  I am asked to see the patient by Dr. Graciela Husbands for evaluation of significant lower extremity edema.  This has been going on for almost a year.  She has been given Lasix on a couple of occasions which may have helped the swelling a little, but not significantly.  She has had multiple ankle and hip surgeries on the left leg many years ago.  The swelling started more in the left leg but both legs are actually affected at this point.  No open wounds or ulceration.  No fevers or chills.  No chest pain or shortness of breath.  No previous history of DVT or superficial thrombophlebitis to her knowledge.  She has tried compression socks without much improvement.  She has been doing this for many months.  She has been elevating her legs.  She did have a DVT study earlier this year did not show any evidence of DVT, but had not had any functional venous assessment of the lower extremities.     Past Medical History:  Diagnosis Date   Allergy    Anemia    Arthritis    back, left elbow, left hip, left ankle    Asthma    Blood transfusion without reported diagnosis    Diabetes mellitus without complication (HCC)    diet controlled   GERD (gastroesophageal reflux disease)    History of hiatal hernia    History of kidney stones    PONV (postoperative nausea and vomiting)    Sleep apnea    Thyroid nodule 06/20/2018    Past Surgical History:  Procedure Laterality Date   ACETABULUM FRACTURE SURGERY Left    CESAREAN SECTION     CHOLECYSTECTOMY     ELBOW FRACTURE SURGERY  1995   FRACTURE SURGERY     left ankle complete repair, left elbow   HERNIA REPAIR     incisional   LAPAROSCOPIC GASTRIC SLEEVE RESECTION  2014   PARATHYROIDECTOMY N/A 10/25/2018    Procedure: PARATHYROIDECTOMY, DIABETIC, SLEEP APNEA;  Surgeon: Duanne Guess, MD;  Location: [facility];  Service: General;  Laterality: N/A;   pelvic cap Left 1995   TONSILLECTOMY     TONSILLECTOMY AND ADENOIDECTOMY N/A 12/08/2015   Procedure: TONSILLECTOMY AND ADENOIDECTOMY;  Surgeon: Linus Salmons, MD;  Location: [facility];  Service: ENT;  Laterality: N/A;   TUBAL LIGATION       Family History  Problem Relation Age of Onset   Diabetes Mother    Hypertension Mother    Cancer Mother    Asthma Mother    Diabetes Father    Hyperlipidemia Father    Hypertension Father    Kidney disease Father    Heart disease Father    Diabetes Maternal Aunt    Diabetes Maternal Uncle    Diabetes Paternal Aunt    Diabetes Paternal Uncle    Breast cancer Maternal Grandmother 68   Breast cancer Paternal Grandmother       Social History   Tobacco Use   Smoking status: Never   Smokeless tobacco: Never  Vaping Use   Vaping Use: Never used  Substance Use Topics   Alcohol use: No    Alcohol/week: 0.0 standard drinks   Drug  use: No     Allergies  Allergen Reactions   Bee Venom Anaphylaxis   Influenza Vac Split [Influenza Virus Vaccine] Anaphylaxis   Metformin Diarrhea   Orange Fruit [Citrus] Itching   Dilaudid [Hydromorphone] Itching   Penicillins Nausea And Vomiting, Rash and Other (See Comments)    Has patient had a PCN reaction causing immediate rash, facial/tongue/throat swelling, SOB or lightheadedness with hypotension: Yes Has patient had a PCN reaction causing severe rash involving mucus membranes or skin necrosis: No Has patient had a PCN reaction that required hospitalization No Has patient had a PCN reaction occurring within the last 10 years: No If all of the above answers are "NO", then may proceed with Cephalosporin use.     Current Outpatient Medications  Medication Sig Dispense Refill   albuterol (PROVENTIL HFA;VENTOLIN HFA) 108 (90 BASE) MCG/ACT inhaler Inhale 2  puffs into the lungs every 6 (six) hours as needed for wheezing or shortness of breath.     albuterol (VENTOLIN HFA) 108 (90 Base) MCG/ACT inhaler INHALE 2 PUFFS BY MOUTH EVERY 6 HOURS AS NEEDED FOR WHEEZING 18 g 12   Artificial Tear Solution (SOOTHE XP OP) Place 1 drop into both eyes daily.      Biotin 2500 MCG CAPS Take 5,000 mcg by mouth daily.     budesonide-formoterol (SYMBICORT) 80-4.5 MCG/ACT inhaler Inhale 2 puffs into the lungs daily as needed (shortness of breath).      budesonide-formoterol (SYMBICORT) 80-4.5 MCG/ACT inhaler INHALE 2 PUFFS BY MOUTH 2 TIMES DAILY 10.2 g 6   busPIRone (BUSPAR) 5 MG tablet Take 5 mg by mouth 2 (two) times daily.      busPIRone (BUSPAR) 7.5 MG tablet TAKE 1 TABLET BY MOUTH TWICE DAILY AS NEEDED 180 tablet 1   EMGALITY 120 MG/ML SOSY Inject 120 mg into the muscle every 30 (thirty) days.      EPINEPHrine 0.3 mg/0.3 mL IJ SOAJ injection Inject 0.3 mg into the muscle as needed (for anaphylaxis).     escitalopram (LEXAPRO) 10 MG tablet Take 10 mg by mouth daily.     escitalopram (LEXAPRO) 10 MG tablet TAKE 1 TABLET BY MOUTH ONCE DAILY 90 tablet 3   ferrous sulfate 325 (65 FE) MG tablet Take 325 mg by mouth daily with breakfast.      fluticasone (FLONASE) 50 MCG/ACT nasal spray Place 1 spray into both nostrils daily.      fluticasone (FLONASE) 50 MCG/ACT nasal spray Place 2 sprays into both nostrils once daily 16 g 11   furosemide (LASIX) 20 MG tablet TAKE 1 TABLET (20 MG TOTAL) BY MOUTH ONCE DAILY AS NEEDED FOR EDEMA 20 tablet 4   ibuprofen (ADVIL) 800 MG tablet Take 1 tablet (800 mg total) by mouth every 8 (eight) hours as needed. 30 tablet 0   Insulin Pen Needle 32G X 4 MM MISC USE AS DIRECTED 100 each 0   Lifitegrast 5 % SOLN Place 1 drop into both eyes 2 (two) times a day.      Liraglutide -Weight Management 18 MG/3ML SOPN INJECT 3 MG SUBCUTANEOUSLY ONCE DAILY 90 mL 11   meclizine (ANTIVERT) 25 MG tablet Take 25 mg by mouth 3 (three) times daily as needed  for dizziness.     methocarbamol (ROBAXIN) 500 MG tablet TAKE 1 TABLET BY MOUTH TWICE DAILY AS NEEDED FOR MUSCLE SPASMS FOR 15 DAYS 30 tablet 0   mometasone (ELOCON) 0.1 % ointment Apply BID for 2 weeks then QOHS 45 g 1   mometasone (  ELOCON) 0.1 % ointment APPLY TWICE A DAY FOR 2 WEEKS THEN EVERY OTHER NIGHT AT BEDTIME 45 g 1   Multiple Vitamins-Minerals (EMERGEN-C IMMUNE PO) Take 1 Package by mouth 3 (three) times a week.      Multiple Vitamins-Minerals (MULTIVITAMIN WITH MINERALS) tablet Take 1 tablet by mouth daily.     omeprazole (PRILOSEC) 40 MG capsule Take 40 mg by mouth daily.     omeprazole (PRILOSEC) 40 MG capsule Take 1 to 2 capsules by mouth once daily 180 capsule 1   polycarbophil (FIBERCON) 625 MG tablet Take 625 mg by mouth daily.     potassium chloride (KLOR-CON) 10 MEQ tablet TAKE 1 TABLET (10 MEQ TOTAL) BY MOUTH ONCE DAILY AS NEEDED (WITH FUROSEMIDE) 20 tablet 4   prochlorperazine (COMPAZINE) 10 MG tablet Take 10 mg by mouth every 6 (six) hours as needed for nausea or vomiting.     tiZANidine (ZANAFLEX) 4 MG tablet Take 4 mg by mouth every 6 (six) hours as needed for muscle spasms.     topiramate (TOPAMAX) 50 MG tablet Take 50 mg by mouth at bedtime.      Vitamin D, Ergocalciferol, (DRISDOL) 1.25 MG (50000 UNIT) CAPS capsule TAKE 1 CAPSULE BY MOUTH ONCE A WEEK 13 capsule 3   Vitamin D, Ergocalciferol, (DRISDOL) 1.25 MG (50000 UNIT) CAPS capsule TAKE ONE CAPSULE BY MOUTH ONCE A WEEK 13 capsule 3   vitamin E 180 MG (400 UNITS) capsule Take 400 Units by mouth daily.     albuterol (VENTOLIN HFA) 108 (90 Base) MCG/ACT inhaler INHALE 2 PUFFS BY MOUTH EVERY 6 HOURS AS NEEDED FOR WHEEZING 18 g 12   budesonide-formoterol (SYMBICORT) 80-4.5 MCG/ACT inhaler INHALE 2 PUFFS BY MOUTH 2 TIMES DAILY 10.2 g 6   Cholecalciferol 125 MCG (5000 UT) capsule Take 5,000 Units by mouth daily.  (Patient not taking: No sig reported)     escitalopram (LEXAPRO) 10 MG tablet TAKE 1 TABLET BY MOUTH ONCE DAILY  30 tablet 11   HYDROcodone-acetaminophen (NORCO/VICODIN) 5-325 MG tablet Take 1 tablet by mouth every 6 (six) hours as needed for moderate pain. (Patient not taking: No sig reported) 15 tablet 0   methylPREDNISolone (MEDROL DOSEPAK) 4 MG TBPK tablet TAKE 6 TABS BY MOUTH ON DAY 1; 5 TABS ON DAY 2; 4 TABS ON DAY 3; 3 TABS ON DAY 4; 2 TABS ON DAY 5; 1 TAB ON DAY 6 THEN STOP (Patient not taking: Reported on 12/29/2020) 21 each 0   No current facility-administered medications for this visit.      REVIEW OF SYSTEMS (Negative unless checked)  Constitutional: [] Weight loss  [] Fever  [] Chills Cardiac: [] Chest pain   [] Chest pressure   [] Palpitations   [] Shortness of breath when laying flat   [] Shortness of breath at rest   [] Shortness of breath with exertion. Vascular:  [x] Pain in legs with walking   [x] Pain in legs at rest   [] Pain in legs when laying flat   [] Claudication   [] Pain in feet when walking  [] Pain in feet at rest  [] Pain in feet when laying flat   [] History of DVT   [] Phlebitis   [x] Swelling in legs   [] Varicose veins   [] Non-healing ulcers Pulmonary:   [] Uses home oxygen   [] Productive cough   [] Hemoptysis   [] Wheeze  [] COPD   [x] Asthma Neurologic:  [] Dizziness  [] Blackouts   [] Seizures   [] History of stroke   [] History of TIA  [] Aphasia   [] Temporary blindness   [] Dysphagia   []   Weakness or numbness in arms   [] Weakness or numbness in legs Musculoskeletal:  [x] Arthritis   [] Joint swelling   [x] Joint pain   [] Low back pain Hematologic:  [] Easy bruising  [] Easy bleeding   [] Hypercoagulable state   [x] Anemic  [] Hepatitis Gastrointestinal:  [] Blood in stool   [] Vomiting blood  [x] Gastroesophageal reflux/heartburn   [] Abdominal pain Genitourinary:  [] Chronic kidney disease   [] Difficult urination  [] Frequent urination  [] Burning with urination   [] Hematuria Skin:  [] Rashes   [] Ulcers   [] Wounds Psychological:  [x] History of anxiety   []  History of major depression.    Physical Exam BP  120/79 (BP Location: Right Arm)   Pulse 87   Resp 16   Ht 5' 5.5" (1.664 m)   Wt 255 lb 3.2 oz (115.8 kg)   BMI 41.82 kg/m  Gen:  WD/WN, NAD Head: [place]/AT, No temporalis wasting.  Ear/Nose/Throat: Hearing grossly intact, nares w/o erythema or drainage, oropharynx w/o Erythema/Exudate Eyes: Conjunctiva clear, sclera non-icteric  Neck: trachea midline.  No JVD.  Pulmonary:  Good air movement, respirations not labored, no use of accessory muscles  Cardiac: RRR, no JVD Vascular:  Vessel Right Left  Radial Palpable Palpable                          DP 1+ 1+  PT NP NP   Gastrointestinal:. No masses, surgical incisions, or scars. Musculoskeletal: M/S 5/5 throughout.  Extremities without ischemic changes.  No deformity or atrophy. 2+ BLE edema. Neurologic: Sensation grossly intact in extremities.  Symmetrical.  Speech is fluent. Motor exam as listed above. Psychiatric: Judgment intact, Mood & affect appropriate for pt's clinical situation. Dermatologic: No rashes or ulcers noted.  No cellulitis or open wounds.    Radiology No results found.  Labs No results found for this or any previous visit (from the past 2160 hour(s)).  Assessment/Plan:  Swelling of limb I have had a long discussion with the patient regarding swelling and why it  causes symptoms.  Patient will continue wearing graduated compression stockings class 1 (20-30 mmHg) on a daily basis. The patient will  beginning wearing the stockings first thing in the morning and removing them in the evening. The patient is instructed specifically not to sleep in the stockings.   In addition, behavioral modification will be initiated.  This will include frequent elevation, use of over the counter pain medications and exercise such as walking.  I have reviewed systemic causes for chronic edema such as liver, kidney and cardiac etiologies.  The patient denies problems with these organ systems.    Consideration for a lymph pump  will also be made based upon the effectiveness of conservative therapy.  This would help to improve the edema control and prevent sequela such as ulcers and infections   Patient should undergo duplex ultrasound of the venous system to ensure that DVT or reflux is not present.  The patient will follow-up with me after the ultrasound.    Diabetes (HCC) blood glucose control important in reducing the progression of atherosclerotic disease. Also, involved in wound healing. Currently diet controlled.   Lymphedema The patient is clearly developed significant lymphedema with her multiple previous major leg surgeries.  She would have at least stage II lymphedema with swelling refractory to compression and elevation.  She is already wearing compression stockings.  We are doing a venous evaluation in the near future at her convenience.  A lymphedema pump would be a good adjuvant  therapy to improve her symptoms.      Festus Barren 12/30/2020, 2:39 PM   This note was created with Dragon medical transcription system.  Any errors from dictation are unintentional.

## 2020-12-30 DIAGNOSIS — I890 Lymphedema, not elsewhere classified: Secondary | ICD-10-CM | POA: Insufficient documentation

## 2020-12-30 DIAGNOSIS — E119 Type 2 diabetes mellitus without complications: Secondary | ICD-10-CM | POA: Insufficient documentation

## 2020-12-30 DIAGNOSIS — M7989 Other specified soft tissue disorders: Secondary | ICD-10-CM | POA: Insufficient documentation

## 2020-12-30 NOTE — Assessment & Plan Note (Signed)
I have had a long discussion with the patient regarding swelling and why it  causes symptoms.  Patient will continue wearing graduated compression stockings class 1 (20-30 mmHg) on a daily basis. The patient will  beginning wearing the stockings first thing in the morning and removing them in the evening. The patient is instructed specifically not to sleep in the stockings.   In addition, behavioral modification will be initiated.  This will include frequent elevation, use of over the counter pain medications and exercise such as walking.  I have reviewed systemic causes for chronic edema such as liver, kidney and cardiac etiologies.  The patient denies problems with these organ systems.    Consideration for a lymph pump will also be made based upon the effectiveness of conservative therapy.  This would help to improve the edema control and prevent sequela such as ulcers and infections   Patient should undergo duplex ultrasound of the venous system to ensure that DVT or reflux is not present.  The patient will follow-up with me after the ultrasound.   

## 2020-12-30 NOTE — Patient Instructions (Signed)
Lymphedema Lymphedema is swelling that is caused by the abnormal collection of lymph in the tissues under the skin. Lymph is excess fluid from the tissues in your body that is removed through the lymphatic system. This system is part of your body's defense system (immune system) and includes lymph nodes and lymph vessels. The lymph vessels collect and carry the excess fluid, fats, proteins, and waste from the tissues of the body to the bloodstream. This system also works to clean and remove bacteria and waste products from the body. Lymphedema occurs when the lymphatic system is blocked. When the lymph vessels or lymph nodes are blocked or damaged, lymph does not drain properly. This causes an abnormal buildup of lymph, which leads to swelling in the affected area. This may include the trunk area, or an arm or leg. Lymphedema cannot be cured by medicines, but various methods can be used to help reduce the swelling. What are the causes? The cause of this condition depends on the type of lymphedema that you have. Primary lymphedema is caused by the absence of lymph vessels or having abnormal lymph vessels at birth. Secondary lymphedema occurs when lymph vessels are blocked or damaged. Secondary lymphedema is more common. Common causes of lymph vessel blockage include: Skin infection, such as cellulitis. Infection by parasites (filariasis). Injury. Radiation therapy. Cancer. Formation of scar tissue. Surgery. What are the signs or symptoms? Symptoms of this condition include: Swelling of the arm or leg. A heavy or tight feeling in the arm or leg. Swelling of the feet, toes, or fingers. Shoes or rings may fit more tightly than before. Redness of the skin over the affected area. Limited movement of the affected limb. Sensitivity to touch or discomfort in the affected limb. How is this diagnosed? This condition may be diagnosed based on: Your symptoms and medical history. A physical  exam. Bioimpedance spectroscopy. In this test, painless electrical currents are used to measure fluid levels in your body. Imaging tests, such as: MRI. CT scan. Duplex ultrasound. This test uses sound waves to produce images of the vessels and the blood flow on a screen. Lymphoscintigraphy. In this test, a low dose of a radioactive substance is injected to trace the flow of lymph through your lymph vessels. Lymphangiography. In this test, a contrast dye is injected into the lymph vessel to help show blockages. How is this treated? If an underlying condition is causing the lymphedema, that condition will be treated. For example, antibiotic medicines may be used to treat an infection. Treatment for this condition will depend on the cause of your lymphedema. Treatment may include: Complete decongestive therapy (CDT). This is done by a certified lymphedema therapist to reduce fluid congestion. This therapy includes: Skin care. Compression wrapping of the affected area. Manual lymph drainage. This is a special massage technique that promotes lymph drainage out of a limb. Specific exercises. Certain exercises can help fluid move out of the affected limb. Compression. Various methods may be used to apply pressure to the affected limb to reduce the swelling. They include: Wearing compression stockings or sleeves on the affected limb. Wrapping the affected limb with special bandages. Surgery. This is usually done for severe cases only. For example, surgery may be done if you have trouble moving the limb or if the swelling does not get better with other treatments. Follow these instructions at home: Self-care The affected area is more likely to become injured or infected. Take these steps to help prevent infection: Keep the affected area clean   and dry. Use approved creams or lotions to keep the skin moisturized. Protect your skin from cuts: Use gloves while cooking or gardening. Do not walk  barefoot. If you shave the affected area, use an electric razor. Do not wear tight clothes, shoes, or jewelry. Eat a healthy diet that includes a lot of fruits and vegetables. Activity Do exercises as told by your health care provider. Do not sit with your legs crossed. When possible, keep the affected limb raised (elevated) above the level of your heart. Avoid carrying things with an arm that is affected by lymphedema. General instructions Wear compression stockings or sleeves as told by your health care provider. Note any changes in size of the affected limb. You may be instructed to take regular measurements and keep track of them. Take over-the-counter and prescription medicines only as told by your health care provider. If you were prescribed an antibiotic medicine, take or apply it as told by your health care provider. Do not stop using the antibiotic even if you start to feel better or if your condition improves. Do not use heating pads or ice packs on the affected area. Avoid having blood draws, IV insertions, or blood pressure checks on the affected limb. Keep all follow-up visits. This is important. Contact a health care provider if you: Continue to have swelling in your limb. Have fluid leaking from the skin of your swollen limb. Have a cut that does not heal. Have redness or pain in the affected area. Develop purplish spots, rash, blisters, or sores (lesions) on your affected limb. Get help right away if you: Have new swelling in your limb that starts suddenly. Have shortness of breath or chest pain. Have a fever or chills. These symptoms may represent a serious problem that is an emergency. Do not wait to see if the symptoms will go away. Get medical help right away. Call your local emergency services (911 in the [place] Do not drive yourself to the hospital. Summary Lymphedema is swelling that is caused by the abnormal collection of lymph in the tissues under the  skin. Lymph is fluid from the tissues in your body that is removed through the lymphatic system. This system collects and carries excess fluid, fats, proteins, and wastes from the tissues of the body to the bloodstream. Lymphedema causes swelling, pain, and redness in the affected area. This may include the trunk area, or an arm or leg. Treatment for this condition may depend on the cause of your lymphedema. Treatment may include treating the underlying cause, complete decongestive therapy (CDT), compression methods, or surgery. This information is not intended to replace advice given to you by your health care provider. Make sure you discuss any questions you have with your health care provider. Document Revised: 01/08/2020 Document Reviewed: 01/08/2020 Elsevier Patient Education  2022 [employer]  

## 2020-12-30 NOTE — Assessment & Plan Note (Addendum)
blood glucose control important in reducing the progression of atherosclerotic disease. Also, involved in wound healing. Currently diet controlled.

## 2020-12-30 NOTE — Assessment & Plan Note (Signed)
The patient is clearly developed significant lymphedema with her multiple previous major leg surgeries.  She would have at least stage II lymphedema with swelling refractory to compression and elevation.  She is already wearing compression stockings.  We are doing a venous evaluation in the near future at her convenience.  A lymphedema pump would be a good adjuvant therapy to improve her symptoms.

## 2021-01-01 ENCOUNTER — Encounter: Payer: Self-pay | Admitting: General Surgery

## 2021-01-04 ENCOUNTER — Other Ambulatory Visit (INDEPENDENT_AMBULATORY_CARE_PROVIDER_SITE_OTHER): Payer: Self-pay | Admitting: Vascular Surgery

## 2021-01-04 DIAGNOSIS — I890 Lymphedema, not elsewhere classified: Secondary | ICD-10-CM

## 2021-01-05 ENCOUNTER — Ambulatory Visit (INDEPENDENT_AMBULATORY_CARE_PROVIDER_SITE_OTHER): Payer: No Typology Code available for payment source

## 2021-01-05 ENCOUNTER — Other Ambulatory Visit: Payer: Self-pay

## 2021-01-05 ENCOUNTER — Ambulatory Visit (INDEPENDENT_AMBULATORY_CARE_PROVIDER_SITE_OTHER): Payer: No Typology Code available for payment source | Admitting: Vascular Surgery

## 2021-01-05 VITALS — BP 120/68 | HR 128 | Ht 65.0 in | Wt 258.0 lb

## 2021-01-05 DIAGNOSIS — E119 Type 2 diabetes mellitus without complications: Secondary | ICD-10-CM | POA: Diagnosis not present

## 2021-01-05 DIAGNOSIS — I890 Lymphedema, not elsewhere classified: Secondary | ICD-10-CM

## 2021-01-05 DIAGNOSIS — I83893 Varicose veins of bilateral lower extremities with other complications: Secondary | ICD-10-CM | POA: Diagnosis not present

## 2021-01-05 NOTE — Progress Notes (Signed)
MRN : [id number]  Ashley [name] is a 55 y.o. (Jan 16, 1966) female who presents with chief complaint of  Chief Complaint  Patient presents with   Follow-up    Fol pt conv ble ven reflux   .  History of Present Illness: Patient returns today in follow up of her leg swelling.  She continues to wear her compression stockings daily, elevate her legs, and try to be as active as possible.  She has been doing this for over 6 months on the direction of her primary care physician without significant improvement.  No major changes since her last visit in the office here. Duplex today shows bilateral significant great saphenous vein reflux as well as deep venous reflux.  No DVT or superficial thrombophlebitis identified.   Current Outpatient Medications  Medication Sig Dispense Refill   albuterol (PROVENTIL HFA;VENTOLIN HFA) 108 (90 BASE) MCG/ACT inhaler Inhale 2 puffs into the lungs every 6 (six) hours as needed for wheezing or shortness of breath.     albuterol (VENTOLIN HFA) 108 (90 Base) MCG/ACT inhaler INHALE 2 PUFFS BY MOUTH EVERY 6 HOURS AS NEEDED FOR WHEEZING 18 g 12   Artificial Tear Solution (SOOTHE XP OP) Place 1 drop into both eyes daily.      Biotin 2500 MCG CAPS Take 5,000 mcg by mouth daily.     budesonide-formoterol (SYMBICORT) 80-4.5 MCG/ACT inhaler Inhale 2 puffs into the lungs daily as needed (shortness of breath).      budesonide-formoterol (SYMBICORT) 80-4.5 MCG/ACT inhaler INHALE 2 PUFFS BY MOUTH 2 TIMES DAILY 10.2 g 6   busPIRone (BUSPAR) 5 MG tablet Take 5 mg by mouth 2 (two) times daily.      busPIRone (BUSPAR) 7.5 MG tablet TAKE 1 TABLET BY MOUTH TWICE DAILY AS NEEDED 180 tablet 1   Cholecalciferol 125 MCG (5000 UT) capsule Take 5,000 Units by mouth daily.     EMGALITY 120 MG/ML SOSY Inject 120 mg into the muscle every 30 (thirty) days.      EPINEPHrine 0.3 mg/0.3 mL IJ SOAJ injection Inject 0.3 mg into the muscle as needed (for anaphylaxis).     escitalopram  (LEXAPRO) 10 MG tablet Take 10 mg by mouth daily.     escitalopram (LEXAPRO) 10 MG tablet TAKE 1 TABLET BY MOUTH ONCE DAILY 90 tablet 3   ferrous sulfate 325 (65 FE) MG tablet Take 325 mg by mouth daily with breakfast.      fluticasone (FLONASE) 50 MCG/ACT nasal spray Place 1 spray into both nostrils daily.      fluticasone (FLONASE) 50 MCG/ACT nasal spray Place 2 sprays into both nostrils once daily 16 g 11   furosemide (LASIX) 20 MG tablet TAKE 1 TABLET (20 MG TOTAL) BY MOUTH ONCE DAILY AS NEEDED FOR EDEMA 20 tablet 4   HYDROcodone-acetaminophen (NORCO/VICODIN) 5-325 MG tablet Take 1 tablet by mouth every 6 (six) hours as needed for moderate pain. 15 tablet 0   ibuprofen (ADVIL) 800 MG tablet Take 1 tablet (800 mg total) by mouth every 8 (eight) hours as needed. 30 tablet 0   Insulin Pen Needle 32G X 4 MM MISC USE AS DIRECTED 100 each 0   Lifitegrast 5 % SOLN Place 1 drop into both eyes 2 (two) times a day.      Liraglutide -Weight Management 18 MG/3ML SOPN INJECT 3 MG SUBCUTANEOUSLY ONCE DAILY 90 mL 11   meclizine (ANTIVERT) 25 MG tablet Take 25 mg by mouth 3 (three) times daily as needed for dizziness.  methocarbamol (ROBAXIN) 500 MG tablet TAKE 1 TABLET BY MOUTH TWICE DAILY AS NEEDED FOR MUSCLE SPASMS FOR 15 DAYS 30 tablet 0   methylPREDNISolone (MEDROL DOSEPAK) 4 MG TBPK tablet TAKE 6 TABS BY MOUTH ON DAY 1; 5 TABS ON DAY 2; 4 TABS ON DAY 3; 3 TABS ON DAY 4; 2 TABS ON DAY 5; 1 TAB ON DAY 6 THEN STOP 21 each 0   mometasone (ELOCON) 0.1 % ointment Apply BID for 2 weeks then QOHS 45 g 1   mometasone (ELOCON) 0.1 % ointment APPLY TWICE A DAY FOR 2 WEEKS THEN EVERY OTHER NIGHT AT BEDTIME 45 g 1   Multiple Vitamins-Minerals (EMERGEN-C IMMUNE PO) Take 1 Package by mouth 3 (three) times a week.      Multiple Vitamins-Minerals (MULTIVITAMIN WITH MINERALS) tablet Take 1 tablet by mouth daily.     omeprazole (PRILOSEC) 40 MG capsule Take 40 mg by mouth daily.     omeprazole (PRILOSEC) 40 MG capsule  Take 1 to 2 capsules by mouth once daily 180 capsule 1   polycarbophil (FIBERCON) 625 MG tablet Take 625 mg by mouth daily.     potassium chloride (KLOR-CON) 10 MEQ tablet TAKE 1 TABLET (10 MEQ TOTAL) BY MOUTH ONCE DAILY AS NEEDED (WITH FUROSEMIDE) 20 tablet 4   prochlorperazine (COMPAZINE) 10 MG tablet Take 10 mg by mouth every 6 (six) hours as needed for nausea or vomiting.     tiZANidine (ZANAFLEX) 4 MG tablet Take 4 mg by mouth every 6 (six) hours as needed for muscle spasms.     topiramate (TOPAMAX) 50 MG tablet Take 50 mg by mouth at bedtime.      Vitamin D, Ergocalciferol, (DRISDOL) 1.25 MG (50000 UNIT) CAPS capsule TAKE 1 CAPSULE BY MOUTH ONCE A WEEK 13 capsule 3   Vitamin D, Ergocalciferol, (DRISDOL) 1.25 MG (50000 UNIT) CAPS capsule TAKE ONE CAPSULE BY MOUTH ONCE A WEEK 13 capsule 3   vitamin E 180 MG (400 UNITS) capsule Take 400 Units by mouth daily.     albuterol (VENTOLIN HFA) 108 (90 Base) MCG/ACT inhaler INHALE 2 PUFFS BY MOUTH EVERY 6 HOURS AS NEEDED FOR WHEEZING 18 g 12   budesonide-formoterol (SYMBICORT) 80-4.5 MCG/ACT inhaler INHALE 2 PUFFS BY MOUTH 2 TIMES DAILY 10.2 g 6   escitalopram (LEXAPRO) 10 MG tablet TAKE 1 TABLET BY MOUTH ONCE DAILY 30 tablet 11   No current facility-administered medications for this visit.    Past Medical History:  Diagnosis Date   Allergy    Anemia    Arthritis    back, left elbow, left hip, left ankle    Asthma    Blood transfusion without reported diagnosis    Diabetes mellitus without complication (HCC)    diet controlled   GERD (gastroesophageal reflux disease)    History of hiatal hernia    History of kidney stones    PONV (postoperative nausea and vomiting)    Sleep apnea    Thyroid nodule 06/20/2018    Past Surgical History:  Procedure Laterality Date   ACETABULUM FRACTURE SURGERY Left    CESAREAN SECTION     CHOLECYSTECTOMY     ELBOW FRACTURE SURGERY  1995   FRACTURE SURGERY     left ankle complete repair, left elbow    HERNIA REPAIR     incisional   LAPAROSCOPIC GASTRIC SLEEVE RESECTION  2014   PARATHYROIDECTOMY N/A 10/25/2018   Procedure: PARATHYROIDECTOMY, DIABETIC, SLEEP APNEA;  Surgeon: Duanne Guess, MD;  Location: [facility];  Service: General;  Laterality: N/A;   pelvic cap Left 1995   TONSILLECTOMY     TONSILLECTOMY AND ADENOIDECTOMY N/A 12/08/2015   Procedure: TONSILLECTOMY AND ADENOIDECTOMY;  Surgeon: Linus Salmons, MD;  Location: [facility];  Service: ENT;  Laterality: N/A;   TUBAL LIGATION       Social History   Tobacco Use   Smoking status: Never   Smokeless tobacco: Never  Vaping Use   Vaping Use: Never used  Substance Use Topics   Alcohol use: No    Alcohol/week: 0.0 standard drinks   Drug use: No      Family History  Problem Relation Age of Onset   Diabetes Mother    Hypertension Mother    Cancer Mother    Asthma Mother    Diabetes Father    Hyperlipidemia Father    Hypertension Father    Kidney disease Father    Heart disease Father    Diabetes Maternal Aunt    Diabetes Maternal Uncle    Diabetes Paternal Aunt    Diabetes Paternal Uncle    Breast cancer Maternal Grandmother 20   Breast cancer Paternal Grandmother      Allergies  Allergen Reactions   Bee Venom Anaphylaxis   Influenza Vac Split [Influenza Virus Vaccine] Anaphylaxis   Metformin Diarrhea   Orange Fruit [Citrus] Itching   Dilaudid [Hydromorphone] Itching   Penicillins Nausea And Vomiting, Rash and Other (See Comments)    Has patient had a PCN reaction causing immediate rash, facial/tongue/throat swelling, SOB or lightheadedness with hypotension: Yes Has patient had a PCN reaction causing severe rash involving mucus membranes or skin necrosis: No Has patient had a PCN reaction that required hospitalization No Has patient had a PCN reaction occurring within the last 10 years: No If all of the above answers are "NO", then may proceed with Cephalosporin use.       REVIEW OF SYSTEMS (Negative  unless checked)   Constitutional: [] Weight loss  [] Fever  [] Chills Cardiac: [] Chest pain   [] Chest pressure   [] Palpitations   [] Shortness of breath when laying flat   [] Shortness of breath at rest   [] Shortness of breath with exertion. Vascular:  [x] Pain in legs with walking   [x] Pain in legs at rest   [] Pain in legs when laying flat   [] Claudication   [] Pain in feet when walking  [] Pain in feet at rest  [] Pain in feet when laying flat   [] History of DVT   [] Phlebitis   [x] Swelling in legs   [] Varicose veins   [] Non-healing ulcers Pulmonary:   [] Uses home oxygen   [] Productive cough   [] Hemoptysis   [] Wheeze  [] COPD   [x] Asthma Neurologic:  [] Dizziness  [] Blackouts   [] Seizures   [] History of stroke   [] History of TIA  [] Aphasia   [] Temporary blindness   [] Dysphagia   [] Weakness or numbness in arms   [] Weakness or numbness in legs Musculoskeletal:  [x] Arthritis   [] Joint swelling   [x] Joint pain   [] Low back pain Hematologic:  [] Easy bruising  [] Easy bleeding   [] Hypercoagulable state   [x] Anemic  [] Hepatitis Gastrointestinal:  [] Blood in stool   [] Vomiting blood  [x] Gastroesophageal reflux/heartburn   [] Abdominal pain Genitourinary:  [] Chronic kidney disease   [] Difficult urination  [] Frequent urination  [] Burning with urination   [] Hematuria Skin:  [] Rashes   [] Ulcers   [] Wounds Psychological:  [x] History of anxiety   []  History of major depression.  Physical Examination  BP 120/68   Pulse (!) 128  Ht 5\' 5"  (1.651 m)   Wt 258 lb (117 kg)   BMI 42.93 kg/m  Gen:  WD/WN, NAD Head: [place]/AT, No temporalis wasting. Ear/Nose/Throat: Hearing grossly intact, nares w/o erythema or drainage Eyes: Conjunctiva clear. Sclera non-icteric Neck: Supple.  Trachea midline Pulmonary:  Good air movement, no use of accessory muscles.  Cardiac: RRR, no JVD Vascular:  Vessel Right Left  Radial Palpable Palpable           Musculoskeletal: M/S 5/5 throughout.  No deformity or atrophy. 2+ BLE  edema. Neurologic: Sensation grossly intact in extremities.  Symmetrical.  Speech is fluent.  Psychiatric: Judgment intact, Mood & affect appropriate for pt's clinical situation. Dermatologic: No rashes or ulcers noted.  No cellulitis or open wounds.      Labs No results found for this or any previous visit (from the past 2160 hour(s)).  Radiology No results found.  Assessment/Plan Diabetes (HCC) blood glucose control important in reducing the progression of atherosclerotic disease. Also, involved in wound healing. Currently diet controlled.     Lymphedema The patient is clearly developed significant lymphedema with her multiple previous major leg surgeries.  She would have at least stage II lymphedema with swelling refractory to compression and elevation.  She is already wearing compression stockings.   A lymphedema pump would be a good adjuvant therapy to improve her symptoms and we can pursue this after her venous interventions  Varicose veins of both legs with edema Duplex today shows bilateral significant great saphenous vein reflux as well as deep venous reflux.  No DVT or superficial thrombophlebitis identified.  The patient has been wearing compression socks and elevating her legs under the direction of her primary care physician for about 6 to 7 months now.  This has not resulted in significant improvement.  Although there may be a component of lymphedema as well, she clearly has significant venous insufficiency that needs to be addressed.  I discussed laser ablation.  I discussed the pathophysiology and natural history of venous disease and why laser ablation will be helpful.  She does still have significant deep venous reflux, so she will need to continue to wear compression socks and elevate her legs and likely will still benefit from a lymphedema pump after her venous intervention.  I have discussed the risks and benefits of the procedure and she is agreeable to  proceed.    [id number], MD  01/05/2021 9:59 AM    This note was created with Dragon medical transcription system.  Any errors from dictation are purely unintentional

## 2021-01-05 NOTE — Assessment & Plan Note (Signed)
Duplex today shows bilateral significant great saphenous vein reflux as well as deep venous reflux.  No DVT or superficial thrombophlebitis identified.  The patient has been wearing compression socks and elevating her legs under the direction of her primary care physician for about 6 to 7 months now.  This has not resulted in significant improvement.  Although there may be a component of lymphedema as well, she clearly has significant venous insufficiency that needs to be addressed.  I discussed laser ablation.  I discussed the pathophysiology and natural history of venous disease and why laser ablation will be helpful.  She does still have significant deep venous reflux, so she will need to continue to wear compression socks and elevate her legs and likely will still benefit from a lymphedema pump after her venous intervention.  I have discussed the risks and benefits of the procedure and she is agreeable to proceed.

## 2021-02-05 ENCOUNTER — Other Ambulatory Visit: Payer: Self-pay

## 2021-03-11 ENCOUNTER — Other Ambulatory Visit: Payer: Self-pay

## 2021-03-17 ENCOUNTER — Other Ambulatory Visit: Payer: Self-pay

## 2021-03-17 MED ORDER — PEG 3350-KCL-NA BICARB-NACL 420 G PO SOLR
ORAL | 0 refills | Status: DC
  Filled 2021-03-17 – 2021-05-28 (×2): qty 4000, 1d supply, fill #0

## 2021-05-03 ENCOUNTER — Other Ambulatory Visit: Payer: Self-pay

## 2021-05-11 ENCOUNTER — Other Ambulatory Visit: Payer: Self-pay

## 2021-05-28 ENCOUNTER — Encounter: Payer: Self-pay | Admitting: *Deleted

## 2021-05-28 ENCOUNTER — Other Ambulatory Visit: Payer: Self-pay

## 2021-05-31 ENCOUNTER — Ambulatory Visit: Payer: No Typology Code available for payment source | Admitting: Anesthesiology

## 2021-05-31 ENCOUNTER — Ambulatory Visit
Admission: RE | Admit: 2021-05-31 | Discharge: 2021-05-31 | Disposition: A | Discharge status: Home / Self Care | Payer: No Typology Code available for payment source | Attending: Gastroenterology | Admitting: Gastroenterology

## 2021-05-31 ENCOUNTER — Encounter
Admission: RE | Disposition: A | Discharge status: Home / Self Care | Payer: Self-pay | Source: Home / Self Care | Attending: Gastroenterology

## 2021-05-31 DIAGNOSIS — M1990 Unspecified osteoarthritis, unspecified site: Secondary | ICD-10-CM | POA: Insufficient documentation

## 2021-05-31 DIAGNOSIS — K5730 Diverticulosis of large intestine without perforation or abscess without bleeding: Secondary | ICD-10-CM | POA: Diagnosis not present

## 2021-05-31 DIAGNOSIS — Z9884 Bariatric surgery status: Secondary | ICD-10-CM | POA: Insufficient documentation

## 2021-05-31 DIAGNOSIS — K635 Polyp of colon: Secondary | ICD-10-CM | POA: Diagnosis not present

## 2021-05-31 DIAGNOSIS — J45909 Unspecified asthma, uncomplicated: Secondary | ICD-10-CM | POA: Diagnosis not present

## 2021-05-31 DIAGNOSIS — K2100 Gastro-esophageal reflux disease with esophagitis, without bleeding: Secondary | ICD-10-CM | POA: Insufficient documentation

## 2021-05-31 DIAGNOSIS — K640 First degree hemorrhoids: Secondary | ICD-10-CM | POA: Diagnosis not present

## 2021-05-31 DIAGNOSIS — K2970 Gastritis, unspecified, without bleeding: Secondary | ICD-10-CM | POA: Diagnosis not present

## 2021-05-31 DIAGNOSIS — E119 Type 2 diabetes mellitus without complications: Secondary | ICD-10-CM | POA: Diagnosis not present

## 2021-05-31 DIAGNOSIS — Z1211 Encounter for screening for malignant neoplasm of colon: Secondary | ICD-10-CM | POA: Diagnosis not present

## 2021-05-31 DIAGNOSIS — E669 Obesity, unspecified: Secondary | ICD-10-CM | POA: Diagnosis not present

## 2021-05-31 DIAGNOSIS — K449 Diaphragmatic hernia without obstruction or gangrene: Secondary | ICD-10-CM | POA: Diagnosis not present

## 2021-05-31 DIAGNOSIS — K319 Disease of stomach and duodenum, unspecified: Secondary | ICD-10-CM | POA: Insufficient documentation

## 2021-05-31 DIAGNOSIS — Z9049 Acquired absence of other specified parts of digestive tract: Secondary | ICD-10-CM | POA: Diagnosis not present

## 2021-05-31 DIAGNOSIS — G4730 Sleep apnea, unspecified: Secondary | ICD-10-CM | POA: Insufficient documentation

## 2021-05-31 HISTORY — PX: COLONOSCOPY WITH PROPOFOL: SHX5780

## 2021-05-31 HISTORY — PX: ESOPHAGOGASTRODUODENOSCOPY (EGD) WITH PROPOFOL: SHX5813

## 2021-05-31 HISTORY — DX: Headache, unspecified: R51.9

## 2021-05-31 SURGERY — COLONOSCOPY WITH PROPOFOL
Anesthesia: General

## 2021-05-31 MED ORDER — PROPOFOL 500 MG/50ML IV EMUL
INTRAVENOUS | Status: DC | PRN
  Administered 2021-05-31: 175 ug/kg/min via INTRAVENOUS

## 2021-05-31 MED ORDER — SODIUM CHLORIDE 0.9 % IV SOLN: INTRAVENOUS | Status: DC

## 2021-05-31 MED ORDER — LIDOCAINE HCL (CARDIAC) PF 100 MG/5ML IV SOSY
PREFILLED_SYRINGE | INTRAVENOUS | Status: DC | PRN
  Administered 2021-05-31: 100 mg via INTRAVENOUS

## 2021-05-31 MED ORDER — PROPOFOL 10 MG/ML IV BOLUS
INTRAVENOUS | Status: AC
  Filled 2021-05-31: qty 20

## 2021-05-31 MED ORDER — PROPOFOL 500 MG/50ML IV EMUL
INTRAVENOUS | Status: AC
  Filled 2021-05-31: qty 50

## 2021-05-31 MED ORDER — PROPOFOL 10 MG/ML IV BOLUS
INTRAVENOUS | Status: DC | PRN
Start: 2021-05-31 — End: 2021-05-31
  Administered 2021-05-31: 100 mg via INTRAVENOUS

## 2021-05-31 NOTE — H&P (Signed)
Outpatient short stay form Pre-procedure ?05/31/2021  ?Lesly Rubenstein, MD ? ?Primary Physician: Adin Hector, MD ? ?Reason for visit:  GERD/Colon cancer screening ? ?History of present illness:   ? ?56 y/o lady with history of obesity s/p gastric sleeve, arthritis, and possible BE's here for EGD/Colonoscopy. Last colonoscopy was in 2014 with diverticulosis. No blood thinners. History of sleeve gastrectomy, umbilical hernia repair, and cholecystectomy. No family history of colon or esophageal cancer but multiple different types of cancer in her family including gallbladder and pancreas. ? ? ? ?Current Facility-Administered Medications:  ?  0.9 %  sodium chloride infusion, , Intravenous, Continuous, Kenzo Ozment, Hilton Cork, MD, Last Rate: 20 mL/hr at 05/31/21 0939, New Bag at 05/31/21 0939 ? ?Medications Prior to Admission  ?Medication Sig Dispense Refill Last Dose  ? Liraglutide -Weight Management (SAXENDA) 18 MG/3ML SOPN Inject into the skin daily.     ? omeprazole (PRILOSEC) 40 MG capsule Take 1 to 2 capsules by mouth once daily 180 capsule 1 05/30/2021  ? albuterol (PROVENTIL HFA;VENTOLIN HFA) 108 (90 BASE) MCG/ACT inhaler Inhale 2 puffs into the lungs every 6 (six) hours as needed for wheezing or shortness of breath.     ? albuterol (VENTOLIN HFA) 108 (90 Base) MCG/ACT inhaler INHALE 2 PUFFS BY MOUTH EVERY 6 HOURS AS NEEDED FOR WHEEZING 18 g 12   ? Artificial Tear Solution (SOOTHE XP OP) Place 1 drop into both eyes daily.      ? Biotin 2500 MCG CAPS Take 5,000 mcg by mouth daily.     ? budesonide-formoterol (SYMBICORT) 80-4.5 MCG/ACT inhaler INHALE 2 PUFFS BY MOUTH 2 TIMES DAILY 10.2 g 6   ? busPIRone (BUSPAR) 5 MG tablet Take 5 mg by mouth 2 (two) times daily.      ? Cholecalciferol 125 MCG (5000 UT) capsule Take 5,000 Units by mouth daily.     ? EMGALITY 120 MG/ML SOSY Inject 120 mg into the muscle every 30 (thirty) days.  (Patient not taking: Reported on 05/31/2021)   Not Taking  ? EPINEPHrine 0.3 mg/0.3 mL IJ  SOAJ injection Inject 0.3 mg into the muscle as needed (for anaphylaxis).     ? escitalopram (LEXAPRO) 10 MG tablet Take 10 mg by mouth daily.     ? ferrous sulfate 325 (65 FE) MG tablet Take 325 mg by mouth daily with breakfast.      ? fluticasone (FLONASE) 50 MCG/ACT nasal spray Place 1 spray into both nostrils daily.      ? furosemide (LASIX) 20 MG tablet TAKE 1 TABLET (20 MG TOTAL) BY MOUTH ONCE DAILY AS NEEDED FOR EDEMA 20 tablet 4   ? HYDROcodone-acetaminophen (NORCO/VICODIN) 5-325 MG tablet Take 1 tablet by mouth every 6 (six) hours as needed for moderate pain. (Patient not taking: Reported on 05/31/2021) 15 tablet 0 Not Taking  ? ibuprofen (ADVIL) 800 MG tablet Take 1 tablet (800 mg total) by mouth every 8 (eight) hours as needed. 30 tablet 0   ? Lifitegrast 5 % SOLN Place 1 drop into both eyes 2 (two) times a day.      ? meclizine (ANTIVERT) 25 MG tablet Take 25 mg by mouth 3 (three) times daily as needed for dizziness.     ? mometasone (ELOCON) 0.1 % ointment Apply BID for 2 weeks then QOHS 45 g 1   ? Multiple Vitamins-Minerals (EMERGEN-C IMMUNE PO) Take 1 Package by mouth 3 (three) times a week.      ? Multiple Vitamins-Minerals (MULTIVITAMIN WITH  MINERALS) tablet Take 1 tablet by mouth daily.     ? polycarbophil (FIBERCON) 625 MG tablet Take 625 mg by mouth daily.     ? polyethylene glycol-electrolytes (NULYTELY) 420 g solution Used as directed. 4000 mL 0   ? potassium chloride (KLOR-CON) 10 MEQ tablet TAKE 1 TABLET (10 MEQ TOTAL) BY MOUTH ONCE DAILY AS NEEDED (WITH FUROSEMIDE) 20 tablet 4   ? prochlorperazine (COMPAZINE) 10 MG tablet Take 10 mg by mouth every 6 (six) hours as needed for nausea or vomiting.     ? tiZANidine (ZANAFLEX) 4 MG tablet Take 4 mg by mouth every 6 (six) hours as needed for muscle spasms.     ? topiramate (TOPAMAX) 50 MG tablet Take 50 mg by mouth at bedtime.      ? Vitamin D, Ergocalciferol, (DRISDOL) 1.25 MG (50000 UNIT) CAPS capsule TAKE ONE CAPSULE BY MOUTH ONCE A WEEK 13  capsule 3   ? vitamin E 180 MG (400 UNITS) capsule Take 400 Units by mouth daily.     ? ? ? ?Allergies  ?Allergen Reactions  ? Bee Venom Anaphylaxis  ? Influenza Vac Split [Influenza Virus Vaccine] Anaphylaxis  ? Dilaudid [Hydromorphone] Itching  ? Metformin Diarrhea  ? Orange Fruit [Citrus] Itching  ? Penicillins Nausea And Vomiting, Rash and Other (See Comments)  ?  Has patient had a PCN reaction causing immediate rash, facial/tongue/throat swelling, SOB or lightheadedness with hypotension: Yes ?Has patient had a PCN reaction causing severe rash involving mucus membranes or skin necrosis: No ?Has patient had a PCN reaction that required hospitalization No ?Has patient had a PCN reaction occurring within the last 10 years: No ?If all of the above answers are "NO", then may proceed with Cephalosporin use. ?  ? ? ? ?Past Medical History:  ?Diagnosis Date  ? Allergy   ? Anemia   ? Arthritis   ? back, left elbow, left hip, left ankle   ? Asthma   ? Blood transfusion without reported diagnosis   ? Diabetes mellitus without complication ([place])   ? diet controlled  ? GERD (gastroesophageal reflux disease)   ? Headache   ? History of hiatal hernia   ? History of kidney stones   ? PONV (postoperative nausea and vomiting)   ? Sleep apnea   ? Thyroid nodule 06/20/2018  ? ? ?Review of systems:  Otherwise negative.  ? ? ?Physical Exam ? ?Gen: Alert, oriented. Appears stated age.  ?HEENT: PERRLA. ?Lungs: No respiratory distress ?CV: RRR ?Abd: soft, benign, no masses ?Ext: No edema ? ? ? ?Planned procedures: Proceed with EGD/colonoscopy. The patient understands the nature of the planned procedure, indications, risks, alternatives and potential complications including but not limited to bleeding, infection, perforation, damage to internal organs and possible oversedation/side effects from anesthesia. The patient agrees and gives consent to proceed.  ?Please refer to procedure notes for findings, recommendations and patient  disposition/instructions.  ? ? ? ?Lesly Rubenstein, MD ?[facility] ? ? ? ?  ? ?

## 2021-05-31 NOTE — Transfer of Care (Signed)
Immediate Anesthesia Transfer of Care Note ? ?Patient: Ashley [name] ? ?Procedure(s) Performed: COLONOSCOPY WITH PROPOFOL ?ESOPHAGOGASTRODUODENOSCOPY (EGD) WITH PROPOFOL ? ?Patient Location: Endoscopy Unit ? ?Anesthesia Type:General ? ?Level of Consciousness: awake ? ?Airway & Oxygen Therapy: Patient Spontanous Breathing ? ?Post-op Assessment: Report given to RN and Post -op Vital signs reviewed and stable ? ?Post vital signs: Reviewed and stable ? ?Last Vitals:  ?Vitals Value Taken Time  ?BP 114/86 05/31/21 1032  ?Temp    ?Pulse 79 05/31/21 1033  ?Resp 16 05/31/21 1033  ?SpO2 98 % 05/31/21 1033  ?Vitals shown include unvalidated device data. ? ?Last Pain:  ?Vitals:  ? 05/31/21 1031  ?TempSrc:   ?PainSc: 0-No pain  ?   ? ?  ? ?Complications: No notable events documented. ?

## 2021-05-31 NOTE — Interval H&P Note (Signed)
History and Physical Interval Note: ? ?05/31/2021 ?9:47 AM ? ?Ashley [name]  has presented today for surgery, with the diagnosis of Colon Cancer Screening Z12.11 ?GERD K21.9 ?History of Barrett's esophagus Z87.19 ?Dysphagia R13.19 ?S/P bariatric surgery Z98.84.  The various methods of treatment have been discussed with the patient and family. After consideration of risks, benefits and other options for treatment, the patient has consented to  Procedure(s): ?COLONOSCOPY WITH PROPOFOL (N/A) ?ESOPHAGOGASTRODUODENOSCOPY (EGD) WITH PROPOFOL (N/A) as a surgical intervention.  The patient's history has been reviewed, patient examined, no change in status, stable for surgery.  I have reviewed the patient's chart and labs.  Questions were answered to the patient's satisfaction.   ? ? ?Rossie Muskrat [name] ? ?Ok to proceed with EGD/Colonoscopy ?

## 2021-05-31 NOTE — Anesthesia Postprocedure Evaluation (Signed)
Anesthesia Post Note ? ?Patient: Ashley [name] ? ?Procedure(s) Performed: COLONOSCOPY WITH PROPOFOL ?ESOPHAGOGASTRODUODENOSCOPY (EGD) WITH PROPOFOL ? ?Patient location during evaluation: PACU ?Anesthesia Type: General ?Level of consciousness: awake and alert ?Pain management: pain level controlled ?Vital Signs Assessment: post-procedure vital signs reviewed and stable ?Respiratory status: spontaneous breathing, nonlabored ventilation, respiratory function stable and patient connected to nasal cannula oxygen ?Cardiovascular status: blood pressure returned to baseline and stable ?Postop Assessment: no apparent nausea or vomiting ?Anesthetic complications: no ? ? ?No notable events documented. ? ? ?Last Vitals:  ?Vitals:  ? 05/31/21 1032 05/31/21 1051  ?BP: 114/86 (!) 132/96  ?Pulse: 84   ?Resp: (!) 21   ?Temp:    ?SpO2: 100%   ?  ?Last Pain:  ?Vitals:  ? 05/31/21 1051  ?TempSrc:   ?PainSc: 0-No pain  ? ? ?  ?  ?  ?  ?  ?  ? ?Yevette Edwards ? ? ? ? ?

## 2021-05-31 NOTE — Op Note (Signed)
[facility] ?Gastroenterology ?Patient Name: Ashley [name] ?Procedure Date: 05/31/2021 9:50 AM ?MRN: [id number] ?Account #: [id number] ?Date of Birth: 1965-09-08 ?Admit Type: Outpatient ?Age: 56 ?Room: [facility] ENDO ROOM 3 ?Gender: Female ?Note Status: Finalized ?Instrument Name: Colonoscope [id number] ?Procedure:             Colonoscopy ?Indications:           Screening for colorectal malignant neoplasm ?Providers:             Andrey Farmer MD, MD ?Referring MD:          Ramonita Lab, MD (Referring MD) ?Medicines:             Monitored Anesthesia Care ?Complications:         No immediate complications. Estimated blood loss:  ?                       Minimal. ?Procedure:             Pre-Anesthesia Assessment: ?                       - Prior to the procedure, a History and Physical was  ?                       performed, and patient medications and allergies were  ?                       reviewed. The patient is competent. The risks and  ?                       benefits of the procedure and the sedation options and  ?                       risks were discussed with the patient. All questions  ?                       were answered and informed consent was obtained.  ?                       Patient identification and proposed procedure were  ?                       verified by the physician, the nurse, the  ?                       anesthesiologist, the anesthetist and the technician  ?                       in the endoscopy suite. Mental Status Examination:  ?                       alert and oriented. Airway Examination: normal  ?                       oropharyngeal airway and neck mobility. Respiratory  ?                       Examination: clear to auscultation. CV Examination:  ?  normal. Prophylactic Antibiotics: The patient does not  ?                       require prophylactic antibiotics. Prior  ?                       Anticoagulants: The patient has taken no previous  ?                        anticoagulant or antiplatelet agents. ASA Grade  ?                       Assessment: III - A patient with severe systemic  ?                       disease. After reviewing the risks and benefits, the  ?                       patient was deemed in satisfactory condition to  ?                       undergo the procedure. The anesthesia plan was to use  ?                       monitored anesthesia care (MAC). Immediately prior to  ?                       administration of medications, the patient was  ?                       re-assessed for adequacy to receive sedatives. The  ?                       heart rate, respiratory rate, oxygen saturations,  ?                       blood pressure, adequacy of pulmonary ventilation, and  ?                       response to care were monitored throughout the  ?                       procedure. The physical status of the patient was  ?                       re-assessed after the procedure. ?                       After obtaining informed consent, the colonoscope was  ?                       passed under direct vision. Throughout the procedure,  ?                       the patient's blood pressure, pulse, and oxygen  ?                       saturations were monitored continuously. The  ?  Colonoscope was introduced through the anus and  ?                       advanced to the the terminal ileum. The colonoscopy  ?                       was performed without difficulty. The patient  ?                       tolerated the procedure well. The quality of the bowel  ?                       preparation was adequate to identify polyps. ?Findings: ?     The perianal and digital rectal examinations were normal. ?     The terminal ileum appeared normal. ?     A 3 mm polyp was found in the proximal transverse colon. The polyp was  ?     sessile. The polyp was removed with a cold snare. Resection and  ?     retrieval were complete. Estimated blood loss  was minimal. ?     Multiple small and large-mouthed diverticula were found in the sigmoid  ?     colon, descending colon and ascending colon. ?     Internal hemorrhoids were found during retroflexion. The hemorrhoids  ?     were Grade I (internal hemorrhoids that do not prolapse). ?     The exam was otherwise without abnormality on direct and retroflexion  ?     views. ?Impression:            - The examined portion of the ileum was normal. ?                       - One 3 mm polyp in the proximal transverse colon,  ?                       removed with a cold snare. Resected and retrieved. ?                       - Diverticulosis in the sigmoid colon, in the  ?                       descending colon and in the ascending colon. ?                       - Internal hemorrhoids. ?                       - The examination was otherwise normal on direct and  ?                       retroflexion views. ?Recommendation:        - Discharge patient to home. ?                       - Resume previous diet. ?                       - Continue present medications. ?                       -  Await pathology results. ?                       - Repeat colonoscopy for surveillance based on  ?                       pathology results. ?                       - Return to referring physician as previously  ?                       scheduled. ?Procedure Code(s):     --- Professional --- ?                       [phone], Colonoscopy, flexible; with removal of  ?                       tumor(s), polyp(s), or other lesion(s) by snare  ?                       technique ?Diagnosis Code(s):     --- Professional --- ?                       Z12.11, Encounter for screening for malignant neoplasm  ?                       of colon ?                       K64.0, First degree hemorrhoids ?                       K63.5, Polyp of colon ?                       K57.30, Diverticulosis of large intestine without  ?                       perforation or abscess without  bleeding ?CPT copyright 2019 [employer]. All rights reserved. ?The codes documented in this report are preliminary and upon coder review may  ?be revised to meet current compliance requirements. ?Andrey Farmer MD, MD ?05/31/2021 10:35:24 AM ?Number of Addenda: 0 ?Note Initiated On: 05/31/2021 9:50 AM ?Scope Withdrawal Time: 0 hours 10 minutes 28 seconds  ?Total Procedure Duration: 0 hours 16 minutes 10 seconds  ?Estimated Blood Loss:  Estimated blood loss was minimal. ?     [facility] ?

## 2021-05-31 NOTE — Anesthesia Preprocedure Evaluation (Signed)
Anesthesia Evaluation  ?Patient identified by MRN, date of birth, ID band ?Patient awake ? ? ? ?Reviewed: ?Allergy & Precautions, H&P , NPO status , Patient's Chart, lab work & pertinent test results, reviewed documented beta blocker date and time  ? ?History of Anesthesia Complications ?(+) PONV and history of anesthetic complications ? ?Airway ?Mallampati: III ? ? ?Neck ROM: full ? ? ? Dental ? ?(+) Poor Dentition, Edentulous Upper ?  ?Pulmonary ?asthma , sleep apnea and Continuous Positive Airway Pressure Ventilation ,  ?  ?Pulmonary exam normal ? ? ? ? ? ? ? Cardiovascular ?Exercise Tolerance: Good ?negative cardio ROS ?Normal cardiovascular exam ?Rhythm:regular Rate:Normal ? ? ?  ?Neuro/Psych ? Headaches, negative psych ROS  ? GI/Hepatic ?Neg liver ROS, hiatal hernia, GERD  Medicated,  ?Endo/Other  ?negative endocrine ROSdiabetes, Well Controlled ? Renal/GU ?negative Renal ROS  ?negative genitourinary ?  ?Musculoskeletal ? ? Abdominal ?  ?Peds ? Hematology ? ?(+) Blood dyscrasia, anemia ,   ?Anesthesia Other Findings ?Past Medical History: ?No date: Allergy ?No date: Anemia ?No date: Arthritis ?    Comment:  back, left elbow, left hip, left ankle  ?No date: Asthma ?No date: Blood transfusion without reported diagnosis ?No date: Diabetes mellitus without complication ([place]) ?    Comment:  diet controlled ?No date: GERD (gastroesophageal reflux disease) ?No date: Headache ?No date: History of hiatal hernia ?No date: History of kidney stones ?No date: PONV (postoperative nausea and vomiting) ?No date: Sleep apnea ?06/20/2018: Thyroid nodule ?Past Surgical History: ?No date: ACETABULUM FRACTURE SURGERY; Left ?No date: CESAREAN SECTION ?No date: CHOLECYSTECTOMY ?1995: ELBOW FRACTURE SURGERY ?No date: FRACTURE SURGERY ?    Comment:  left ankle complete repair, left elbow ?No date: HERNIA REPAIR ?    Comment:  incisional ?2014: LAPAROSCOPIC GASTRIC SLEEVE RESECTION ?10/25/2018:  PARATHYROIDECTOMY; N/A ?    Comment:  Procedure: PARATHYROIDECTOMY, DIABETIC, SLEEP APNEA;   ?             Surgeon: Fredirick Maudlin, MD;  Location: [facility] ORS;   ?             Service: General;  Laterality: N/A; ?1995: pelvic cap; Left ?No date: TONSILLECTOMY ?12/08/2015: TONSILLECTOMY AND ADENOIDECTOMY; N/A ?    Comment:  Procedure: TONSILLECTOMY AND ADENOIDECTOMY;  Surgeon:  ?             Beverly Gust, MD;  Location: [facility] ORS;  Service: ENT;  ?             Laterality: N/A; ?No date: TUBAL LIGATION ?BMI   ? Body Mass Index: 42.43 kg/m?  ?  ? Reproductive/Obstetrics ?negative OB ROS ? ?  ? ? ? ? ? ? ? ? ? ? ? ? ? ?  ?  ? ? ? ? ? ? ? ? ?Anesthesia Physical ?Anesthesia Plan ? ?ASA: 3 ? ?Anesthesia Plan: General  ? ?Post-op Pain Management:   ? ?Induction:  ? ?PONV Risk Score and Plan:  ? ?Airway Management Planned:  ? ?Additional Equipment:  ? ?Intra-op Plan:  ? ?Post-operative Plan:  ? ?Informed Consent: I have reviewed the patients History and Physical, chart, labs and discussed the procedure including the risks, benefits and alternatives for the proposed anesthesia with the patient or authorized representative who has indicated his/her understanding and acceptance.  ? ? ? ?Dental Advisory Given ? ?Plan Discussed with: CRNA ? ?Anesthesia Plan Comments:   ? ? ? ? ? ? ?Anesthesia Quick Evaluation ? ?

## 2021-05-31 NOTE — Op Note (Signed)
[facility] ?Gastroenterology ?Patient Name: Ashley [name] ?Procedure Date: 05/31/2021 9:51 AM ?MRN: [id number] ?Account #: [id number] ?Date of Birth: [birth year] ?Admit Type: Outpatient ?Age: 56 ?Room: [facility] ENDO ROOM 3 ?Gender: Female ?Note Status: Finalized ?Instrument Name: Upper Endoscope [id number] ?Procedure:             Upper GI endoscopy ?Indications:           Gastro-esophageal reflux disease ?Providers:             Andrey Farmer MD, MD ?Referring MD:          Ramonita Lab, MD (Referring MD) ?Medicines:             Monitored Anesthesia Care ?Complications:         No immediate complications. Estimated blood loss:  ?                       Minimal. ?Procedure:             Pre-Anesthesia Assessment: ?                       - Prior to the procedure, a History and Physical was  ?                       performed, and patient medications and allergies were  ?                       reviewed. The patient is competent. The risks and  ?                       benefits of the procedure and the sedation options and  ?                       risks were discussed with the patient. All questions  ?                       were answered and informed consent was obtained.  ?                       Patient identification and proposed procedure were  ?                       verified by the physician, the nurse, the  ?                       anesthesiologist, the anesthetist and the technician  ?                       in the endoscopy suite. Mental Status Examination:  ?                       alert and oriented. Airway Examination: normal  ?                       oropharyngeal airway and neck mobility. Respiratory  ?                       Examination: clear to auscultation. CV Examination:  ?  normal. Prophylactic Antibiotics: The patient does not  ?                       require prophylactic antibiotics. Prior  ?                       Anticoagulants: The patient has taken no previous  ?                        anticoagulant or antiplatelet agents. ASA Grade  ?                       Assessment: III - A patient with severe systemic  ?                       disease. After reviewing the risks and benefits, the  ?                       patient was deemed in satisfactory condition to  ?                       undergo the procedure. The anesthesia plan was to use  ?                       monitored anesthesia care (MAC). Immediately prior to  ?                       administration of medications, the patient was  ?                       re-assessed for adequacy to receive sedatives. The  ?                       heart rate, respiratory rate, oxygen saturations,  ?                       blood pressure, adequacy of pulmonary ventilation, and  ?                       response to care were monitored throughout the  ?                       procedure. The physical status of the patient was  ?                       re-assessed after the procedure. ?                       After obtaining informed consent, the endoscope was  ?                       passed under direct vision. Throughout the procedure,  ?                       the patient's blood pressure, pulse, and oxygen  ?                       saturations were monitored continuously. The Endoscope  ?  was introduced through the mouth, and advanced to the  ?                       second part of duodenum. The upper GI endoscopy was  ?                       accomplished without difficulty. The patient tolerated  ?                       the procedure well. ?Findings: ?     A 2 cm hiatal hernia was present. ?     The esophagus and gastroesophageal junction were examined with white  ?     light and narrow band imaging (NBI). There was no visual evidence of  ?     Barrett's esophagus. ?     LA Grade A (one or more mucosal breaks less than 5 mm, not extending  ?     between tops of 2 mucosal folds) esophagitis with no bleeding was found. ?     Evidence  of a sleeve gastrectomy was found in the stomach. ?     Patchy mild inflammation characterized by erythema was found in the  ?     gastric antrum. Biopsies were taken with a cold forceps for Helicobacter  ?     pylori testing. Estimated blood loss was minimal. ?     Bilious fluid was found in the gastric body. ?     The examined duodenum was normal. ?Impression:            - 2 cm hiatal hernia. ?                       - There is no endoscopic evidence of Barrett's  ?                       esophagus. ?                       - LA Grade A reflux esophagitis with no bleeding. ?                       - A sleeve gastrectomy was found. ?                       - Gastritis. Biopsied. ?                       - Bilious gastric fluid. ?                       - Normal examined duodenum. ?Recommendation:        - Discharge patient to home. ?                       - Resume previous diet. ?                       - Continue present medications. ?                       - Await pathology results. ?                       -  Return to referring physician as previously  ?                       scheduled. ?Procedure Code(s):     --- Professional --- ?                       [phone], Esophagogastroduodenoscopy, flexible,  ?                       transoral; with biopsy, single or multiple ?Diagnosis Code(s):     --- Professional --- ?                       K44.9, Diaphragmatic hernia without obstruction or  ?                       gangrene ?                       K21.00, Gastro-esophageal reflux disease with  ?                       esophagitis, without bleeding ?                       Z98.84, Bariatric surgery status ?                       K29.70, Gastritis, unspecified, without bleeding ?CPT copyright 2019 American Medical Association. All rights reserved. ?The codes documented in this report are preliminary and upon coder review may  ?be revised to meet current compliance requirements. ?Andrey Farmer MD, MD ?05/31/2021 10:31:35 AM ?Number  of Addenda: 0 ?Note Initiated On: 05/31/2021 9:51 AM ?Estimated Blood Loss:  Estimated blood loss was minimal. ?     [facility] ?

## 2021-06-01 ENCOUNTER — Encounter: Payer: Self-pay | Admitting: Gastroenterology

## 2021-06-01 LAB — SURGICAL PATHOLOGY

## 2021-06-23 ENCOUNTER — Other Ambulatory Visit: Payer: Self-pay

## 2021-06-23 MED ORDER — MOUNJARO 2.5 MG/0.5ML ~~LOC~~ SOAJ
SUBCUTANEOUS | 11 refills | Status: DC
  Filled 2021-06-23: qty 2, 30d supply, fill #0
  Filled 2021-06-25 – 2021-07-05 (×4): qty 2, 28d supply, fill #0
  Filled 2021-07-28 – 2021-08-30 (×2): qty 2, 28d supply, fill #1

## 2021-06-25 ENCOUNTER — Other Ambulatory Visit: Payer: Self-pay

## 2021-06-30 ENCOUNTER — Other Ambulatory Visit: Payer: Self-pay

## 2021-07-02 ENCOUNTER — Other Ambulatory Visit: Payer: Self-pay

## 2021-07-05 ENCOUNTER — Other Ambulatory Visit: Payer: Self-pay

## 2021-07-06 ENCOUNTER — Other Ambulatory Visit: Payer: Self-pay

## 2021-07-20 ENCOUNTER — Other Ambulatory Visit: Payer: Self-pay

## 2021-07-20 MED ORDER — OMEPRAZOLE 40 MG PO CPDR
DELAYED_RELEASE_CAPSULE | ORAL | 1 refills | Status: DC
Start: 2021-07-20 — End: 2022-03-15
  Filled 2021-07-20: qty 180, 90d supply, fill #0
  Filled 2021-12-16: qty 180, 90d supply, fill #1

## 2021-07-21 ENCOUNTER — Other Ambulatory Visit: Payer: Self-pay

## 2021-07-28 ENCOUNTER — Other Ambulatory Visit: Payer: Self-pay

## 2021-07-28 MED ORDER — MOUNJARO 5 MG/0.5ML ~~LOC~~ SOAJ
SUBCUTANEOUS | 11 refills | Status: DC
  Filled 2021-07-28: qty 2, 28d supply, fill #0

## 2021-08-30 ENCOUNTER — Other Ambulatory Visit: Payer: Self-pay

## 2021-08-30 MED ORDER — MOUNJARO 7.5 MG/0.5ML ~~LOC~~ SOAJ
SUBCUTANEOUS | 11 refills | Status: DC
  Filled 2021-08-30: qty 2, 28d supply, fill #0
  Filled 2021-09-23: qty 2, 28d supply, fill #1
  Filled 2021-10-25: qty 2, 28d supply, fill #2

## 2021-09-03 ENCOUNTER — Other Ambulatory Visit: Payer: Self-pay

## 2021-09-03 MED ORDER — ESCITALOPRAM OXALATE 10 MG PO TABS
10.0000 mg | ORAL_TABLET | Freq: Every day | ORAL | 3 refills | Status: DC
  Filled 2021-09-03: qty 90, 90d supply, fill #0
  Filled 2021-12-16: qty 90, 90d supply, fill #1
  Filled 2022-05-04: qty 90, 90d supply, fill #2
  Filled 2022-08-09: qty 90, 90d supply, fill #3

## 2021-09-04 ENCOUNTER — Other Ambulatory Visit: Payer: Self-pay

## 2021-09-06 ENCOUNTER — Other Ambulatory Visit: Payer: Self-pay

## 2021-09-23 ENCOUNTER — Other Ambulatory Visit: Payer: Self-pay

## 2021-09-23 MED ORDER — MOUNJARO 7.5 MG/0.5ML ~~LOC~~ SOAJ: SUBCUTANEOUS | 11 refills | Status: DC

## 2021-09-27 ENCOUNTER — Other Ambulatory Visit: Payer: Self-pay

## 2021-10-20 ENCOUNTER — Other Ambulatory Visit: Payer: Self-pay | Admitting: Internal Medicine

## 2021-10-20 DIAGNOSIS — Z1231 Encounter for screening mammogram for malignant neoplasm of breast: Secondary | ICD-10-CM

## 2021-10-25 ENCOUNTER — Other Ambulatory Visit: Payer: Self-pay

## 2021-10-25 MED ORDER — MOUNJARO 7.5 MG/0.5ML ~~LOC~~ SOAJ
SUBCUTANEOUS | 11 refills | Status: DC
  Filled 2021-10-25: qty 2, 28d supply, fill #0

## 2021-10-26 ENCOUNTER — Other Ambulatory Visit: Payer: Self-pay

## 2021-10-27 ENCOUNTER — Other Ambulatory Visit: Payer: Self-pay

## 2021-10-27 MED ORDER — MOUNJARO 10 MG/0.5ML ~~LOC~~ SOAJ
SUBCUTANEOUS | 3 refills | Status: DC
  Filled 2021-10-27: qty 2, 28d supply, fill #0
  Filled 2021-11-19 – 2021-11-24 (×2): qty 2, 28d supply, fill #1

## 2021-11-16 ENCOUNTER — Ambulatory Visit
Admission: RE | Admit: 2021-11-16 | Discharge: 2021-11-16 | Disposition: A | Discharge status: Home / Self Care | Payer: No Typology Code available for payment source | Source: Ambulatory Visit | Attending: Internal Medicine | Admitting: Internal Medicine

## 2021-11-16 DIAGNOSIS — Z1231 Encounter for screening mammogram for malignant neoplasm of breast: Secondary | ICD-10-CM | POA: Insufficient documentation

## 2021-11-19 ENCOUNTER — Other Ambulatory Visit: Payer: Self-pay

## 2021-11-19 MED ORDER — BUSPIRONE HCL 7.5 MG PO TABS
7.5000 mg | ORAL_TABLET | Freq: Two times a day (BID) | ORAL | 1 refills | Status: DC | PRN
  Filled 2021-11-19: qty 180, 90d supply, fill #0
  Filled 2022-08-10: qty 180, 90d supply, fill #1

## 2021-11-24 ENCOUNTER — Other Ambulatory Visit: Payer: Self-pay

## 2021-12-01 ENCOUNTER — Other Ambulatory Visit: Payer: Self-pay

## 2021-12-01 MED ORDER — VITAMIN D (ERGOCALCIFEROL) 1.25 MG (50000 UNIT) PO CAPS
ORAL_CAPSULE | ORAL | 4 refills | Status: DC
  Filled 2021-12-01: qty 13, 90d supply, fill #0
  Filled 2022-03-14: qty 13, 90d supply, fill #1
  Filled 2022-07-06: qty 13, 90d supply, fill #2
  Filled 2022-10-24: qty 13, 90d supply, fill #3

## 2021-12-16 ENCOUNTER — Other Ambulatory Visit: Payer: Self-pay

## 2021-12-20 ENCOUNTER — Other Ambulatory Visit: Payer: Self-pay

## 2021-12-20 MED ORDER — BUDESONIDE-FORMOTEROL FUMARATE 80-4.5 MCG/ACT IN AERO
INHALATION_SPRAY | RESPIRATORY_TRACT | 1 refills | Status: AC
  Filled 2021-12-20: qty 10.2, 30d supply, fill #0

## 2021-12-20 MED ORDER — MOUNJARO 12.5 MG/0.5ML ~~LOC~~ SOAJ
SUBCUTANEOUS | 11 refills | Status: DC
  Filled 2021-12-20: qty 2, 28d supply, fill #0
  Filled 2022-01-12: qty 2, 28d supply, fill #1

## 2021-12-21 ENCOUNTER — Other Ambulatory Visit: Payer: Self-pay

## 2022-01-13 ENCOUNTER — Other Ambulatory Visit: Payer: Self-pay

## 2022-01-24 ENCOUNTER — Encounter (INDEPENDENT_AMBULATORY_CARE_PROVIDER_SITE_OTHER): Payer: Self-pay

## 2022-02-15 ENCOUNTER — Other Ambulatory Visit: Payer: Self-pay

## 2022-02-15 MED ORDER — MOUNJARO 15 MG/0.5ML ~~LOC~~ SOAJ
SUBCUTANEOUS | 3 refills | Status: DC
  Filled 2022-02-15: qty 2, 28d supply, fill #0
  Filled 2022-03-10: qty 2, 28d supply, fill #1
  Filled 2022-04-14: qty 2, 28d supply, fill #2
  Filled 2022-05-10: qty 2, 28d supply, fill #3
  Filled 2022-06-01: qty 2, 28d supply, fill #4
  Filled 2022-07-06: qty 2, 28d supply, fill #5
  Filled 2022-08-02: qty 2, 28d supply, fill #6
  Filled 2022-09-01: qty 2, 28d supply, fill #7
  Filled 2022-09-30: qty 2, 28d supply, fill #8
  Filled 2022-10-26 (×2): qty 2, 28d supply, fill #9
  Filled 2022-11-29: qty 2, 28d supply, fill #10
  Filled 2022-12-21: qty 2, 28d supply, fill #11

## 2022-02-23 ENCOUNTER — Other Ambulatory Visit: Payer: Self-pay | Admitting: Certified Nurse Midwife

## 2022-02-23 DIAGNOSIS — Z1231 Encounter for screening mammogram for malignant neoplasm of breast: Secondary | ICD-10-CM

## 2022-03-10 ENCOUNTER — Other Ambulatory Visit: Payer: Self-pay

## 2022-03-11 ENCOUNTER — Other Ambulatory Visit: Payer: Self-pay

## 2022-03-14 ENCOUNTER — Other Ambulatory Visit: Payer: Self-pay

## 2022-03-15 ENCOUNTER — Other Ambulatory Visit: Payer: Self-pay

## 2022-03-15 MED ORDER — OMEPRAZOLE 40 MG PO CPDR
DELAYED_RELEASE_CAPSULE | ORAL | 1 refills | Status: DC
  Filled 2022-03-15: qty 180, 90d supply, fill #0
  Filled 2022-08-09: qty 180, 90d supply, fill #1

## 2022-04-06 DIAGNOSIS — M1652 Unilateral post-traumatic osteoarthritis, left hip: Secondary | ICD-10-CM | POA: Diagnosis not present

## 2022-04-07 ENCOUNTER — Other Ambulatory Visit: Payer: Self-pay | Admitting: Orthopedic Surgery

## 2022-04-07 DIAGNOSIS — M1652 Unilateral post-traumatic osteoarthritis, left hip: Secondary | ICD-10-CM

## 2022-04-13 ENCOUNTER — Ambulatory Visit
Admission: RE | Admit: 2022-04-13 | Discharge: 2022-04-13 | Disposition: A | Discharge status: Home / Self Care | Payer: 59 | Source: Ambulatory Visit | Attending: Orthopedic Surgery | Admitting: Orthopedic Surgery

## 2022-04-13 DIAGNOSIS — M1652 Unilateral post-traumatic osteoarthritis, left hip: Secondary | ICD-10-CM | POA: Diagnosis not present

## 2022-04-13 DIAGNOSIS — R937 Abnormal findings on diagnostic imaging of other parts of musculoskeletal system: Secondary | ICD-10-CM | POA: Diagnosis not present

## 2022-04-13 DIAGNOSIS — Z01818 Encounter for other preprocedural examination: Secondary | ICD-10-CM | POA: Diagnosis not present

## 2022-04-14 ENCOUNTER — Other Ambulatory Visit: Payer: Self-pay

## 2022-04-18 DIAGNOSIS — M1612 Unilateral primary osteoarthritis, left hip: Secondary | ICD-10-CM | POA: Diagnosis not present

## 2022-05-10 ENCOUNTER — Other Ambulatory Visit: Payer: Self-pay

## 2022-06-02 ENCOUNTER — Other Ambulatory Visit: Payer: Self-pay

## 2022-06-23 DIAGNOSIS — E119 Type 2 diabetes mellitus without complications: Secondary | ICD-10-CM | POA: Diagnosis not present

## 2022-06-23 DIAGNOSIS — E559 Vitamin D deficiency, unspecified: Secondary | ICD-10-CM | POA: Diagnosis not present

## 2022-06-23 DIAGNOSIS — E785 Hyperlipidemia, unspecified: Secondary | ICD-10-CM | POA: Diagnosis not present

## 2022-06-30 DIAGNOSIS — Z9884 Bariatric surgery status: Secondary | ICD-10-CM | POA: Diagnosis not present

## 2022-06-30 DIAGNOSIS — Z9889 Other specified postprocedural states: Secondary | ICD-10-CM | POA: Diagnosis not present

## 2022-06-30 DIAGNOSIS — D649 Anemia, unspecified: Secondary | ICD-10-CM | POA: Diagnosis not present

## 2022-06-30 DIAGNOSIS — E785 Hyperlipidemia, unspecified: Secondary | ICD-10-CM | POA: Diagnosis not present

## 2022-06-30 DIAGNOSIS — J4520 Mild intermittent asthma, uncomplicated: Secondary | ICD-10-CM | POA: Diagnosis not present

## 2022-06-30 DIAGNOSIS — E559 Vitamin D deficiency, unspecified: Secondary | ICD-10-CM | POA: Diagnosis not present

## 2022-06-30 DIAGNOSIS — G4733 Obstructive sleep apnea (adult) (pediatric): Secondary | ICD-10-CM | POA: Diagnosis not present

## 2022-06-30 DIAGNOSIS — E119 Type 2 diabetes mellitus without complications: Secondary | ICD-10-CM | POA: Diagnosis not present

## 2022-06-30 DIAGNOSIS — Z0000 Encounter for general adult medical examination without abnormal findings: Secondary | ICD-10-CM | POA: Diagnosis not present

## 2022-06-30 DIAGNOSIS — K219 Gastro-esophageal reflux disease without esophagitis: Secondary | ICD-10-CM | POA: Diagnosis not present

## 2022-07-04 DIAGNOSIS — E119 Type 2 diabetes mellitus without complications: Secondary | ICD-10-CM | POA: Diagnosis not present

## 2022-07-04 DIAGNOSIS — H2513 Age-related nuclear cataract, bilateral: Secondary | ICD-10-CM | POA: Diagnosis not present

## 2022-07-06 ENCOUNTER — Other Ambulatory Visit: Payer: Self-pay

## 2022-07-13 DIAGNOSIS — M12552 Traumatic arthropathy, left hip: Secondary | ICD-10-CM | POA: Diagnosis not present

## 2022-07-13 DIAGNOSIS — M25552 Pain in left hip: Secondary | ICD-10-CM | POA: Diagnosis not present

## 2022-08-02 ENCOUNTER — Other Ambulatory Visit: Payer: Self-pay

## 2022-08-02 MED ORDER — METRONIDAZOLE 500 MG PO TABS
500.0000 mg | ORAL_TABLET | Freq: Three times a day (TID) | ORAL | 0 refills | Status: DC
  Filled 2022-08-02: qty 21, 7d supply, fill #0

## 2022-08-02 MED ORDER — CIPROFLOXACIN HCL 500 MG PO TABS
500.0000 mg | ORAL_TABLET | Freq: Two times a day (BID) | ORAL | 0 refills | Status: DC
  Filled 2022-08-02: qty 14, 7d supply, fill #0

## 2022-08-09 ENCOUNTER — Other Ambulatory Visit: Payer: Self-pay

## 2022-08-10 ENCOUNTER — Other Ambulatory Visit: Payer: Self-pay

## 2022-08-16 ENCOUNTER — Ambulatory Visit
Admission: EM | Admit: 2022-08-16 | Discharge: 2022-08-16 | Disposition: A | Discharge status: Home / Self Care | Payer: 59 | Attending: Internal Medicine | Admitting: Internal Medicine

## 2022-08-16 ENCOUNTER — Other Ambulatory Visit: Payer: Self-pay | Admitting: Orthopedic Surgery

## 2022-08-16 DIAGNOSIS — R109 Unspecified abdominal pain: Secondary | ICD-10-CM

## 2022-08-16 DIAGNOSIS — M7918 Myalgia, other site: Secondary | ICD-10-CM | POA: Diagnosis not present

## 2022-08-16 LAB — URINALYSIS, W/ REFLEX TO CULTURE (INFECTION SUSPECTED)
Bilirubin Urine: NEGATIVE
Glucose, UA: NEGATIVE mg/dL
Hgb urine dipstick: NEGATIVE
Ketones, ur: NEGATIVE mg/dL
Leukocytes,Ua: NEGATIVE
Nitrite: NEGATIVE
Protein, ur: NEGATIVE mg/dL
RBC / HPF: NONE SEEN RBC/hpf (ref 0–5)
Specific Gravity, Urine: 1.025 (ref 1.005–1.030)
pH: 6.5 (ref 5.0–8.0)

## 2022-08-16 MED ORDER — TIZANIDINE HCL 4 MG PO TABS
4.0000 mg | ORAL_TABLET | Freq: Three times a day (TID) | ORAL | 0 refills | Status: AC | PRN
  Filled 2022-08-16: qty 15, 5d supply, fill #0

## 2022-08-16 MED ORDER — KETOROLAC TROMETHAMINE 30 MG/ML IJ SOLN
30.0000 mg | Freq: Once | INTRAMUSCULAR | Status: AC
  Administered 2022-08-16: 30 mg via INTRAMUSCULAR

## 2022-08-16 NOTE — ED Triage Notes (Signed)
Patient with c/o left side and back pain. Patient states she has a hx of diverticulitis and her doctor gave her a course of cipro and flagyl but the pain has continued.

## 2022-08-16 NOTE — Discharge Instructions (Addendum)
Follow up with Dr. Graciela Husbands regarding outpt Ct scan. Take Zanaflex aqs prescribed. Your urine was negative for acute UTI, you received Toradol in urgent care. Go to ER for new or worsening issues or concerns.

## 2022-08-16 NOTE — ED Provider Notes (Signed)
[facility]    CSN: [id number] Arrival date & time: 08/16/22  1810      History   Chief Complaint Chief Complaint  Patient presents with   Back Pain    HPI Ashley [name] is a 57 y.o. female.   57 year old female, Ashley [name], presents to urgent care with chief complaint of left side back pain x 3 weeks.  Patient states she has a history of diverticulitis and Dr. Graciela Husbands gave her Cipro and Flagyl but the pain has continued pain worsens with movement.  Patient has not tried any muscle relaxers, no dysuria, no fever, no nausea vomiting or diarrhea  The history is provided by the patient. No language interpreter was used.    Past Medical History:  Diagnosis Date   Allergy    Anemia    Arthritis    back, left elbow, left hip, left ankle    Asthma    Blood transfusion without reported diagnosis    Diabetes mellitus without complication (HCC)    diet controlled   GERD (gastroesophageal reflux disease)    Headache    History of hiatal hernia    History of kidney stones    PONV (postoperative nausea and vomiting)    Sleep apnea    Thyroid nodule 06/20/2018    Patient Active Problem List   Diagnosis Date Noted   Side pain 08/16/2022   Musculoskeletal pain 08/16/2022   Varicose veins of both legs with edema 01/05/2021   Swelling of limb 12/30/2020   Diabetes (HCC) 12/30/2020   Lymphedema 12/30/2020   S/P subtotal parathyroidectomy 11/08/2018   Hyperparathyroidism (HCC) 06/20/2018   Thyroid nodule 06/20/2018   Intractable pain 04/12/2015    Past Surgical History:  Procedure Laterality Date   ACETABULUM FRACTURE SURGERY Left    CESAREAN SECTION     CHOLECYSTECTOMY     COLONOSCOPY WITH PROPOFOL N/A 05/31/2021   Procedure: COLONOSCOPY WITH PROPOFOL;  Surgeon: Regis Bill, MD;  Location: [facility] ENDOSCOPY;  Service: Endoscopy;  Laterality: N/A;   ELBOW FRACTURE SURGERY  1995   ESOPHAGOGASTRODUODENOSCOPY (EGD) WITH PROPOFOL N/A  05/31/2021   Procedure: ESOPHAGOGASTRODUODENOSCOPY (EGD) WITH PROPOFOL;  Surgeon: Regis Bill, MD;  Location: [facility] ENDOSCOPY;  Service: Endoscopy;  Laterality: N/A;   FRACTURE SURGERY     left ankle complete repair, left elbow   HERNIA REPAIR     incisional   LAPAROSCOPIC GASTRIC SLEEVE RESECTION  2014   PARATHYROIDECTOMY N/A 10/25/2018   Procedure: PARATHYROIDECTOMY, DIABETIC, SLEEP APNEA;  Surgeon: Duanne Guess, MD;  Location: [facility] ORS;  Service: General;  Laterality: N/A;   pelvic cap Left 1995   TONSILLECTOMY     TONSILLECTOMY AND ADENOIDECTOMY N/A 12/08/2015   Procedure: TONSILLECTOMY AND ADENOIDECTOMY;  Surgeon: Linus Salmons, MD;  Location: [facility] ORS;  Service: ENT;  Laterality: N/A;   TUBAL LIGATION      OB History   No obstetric history on file.      Home Medications    Prior to Admission medications   Medication Sig Start Date End Date Taking? Authorizing Provider  tiZANidine (ZANAFLEX) 4 MG tablet Take 1 tablet (4 mg total) by mouth every 8 (eight) hours as needed for up to 5 days for muscle spasms. 08/16/22 08/21/22 Yes Read Bonelli, Para March, NP  albuterol (PROVENTIL HFA;VENTOLIN HFA) 108 (90 BASE) MCG/ACT inhaler Inhale 2 puffs into the lungs every 6 (six) hours as needed for wheezing or shortness of breath.    [provider]  albuterol (VENTOLIN HFA) 108 (  90 Base) MCG/ACT inhaler INHALE 2 PUFFS BY MOUTH EVERY 6 HOURS AS NEEDED FOR WHEEZING 07/26/19 07/25/20  Curtis Sites III, MD  Artificial Tear Solution (SOOTHE XP OP) Place 1 drop into both eyes daily.     [provider]  Biotin 2500 MCG CAPS Take 5,000 mcg by mouth daily.    [provider]  budesonide-formoterol (SYMBICORT) 80-4.5 MCG/ACT inhaler INHALE 2 PUFFS BY MOUTH 2 TIMES DAILY 07/26/19 07/25/20  Curtis Sites III, MD  budesonide-formoterol [facility]) 80-4.5 MCG/ACT inhaler Inhale into the lungs. 12/20/21     busPIRone (BUSPAR) 5 MG tablet Take 5 mg by mouth 2 (two) times daily.      [provider]  busPIRone (BUSPAR) 7.5 MG tablet Take 1 tablet (7.5 mg total) by mouth 2 (two) times daily as needed. 11/19/21     Cholecalciferol 125 MCG (5000 UT) capsule Take 5,000 Units by mouth daily.    [provider]  EMGALITY 120 MG/ML SOSY Inject 120 mg into the muscle every 30 (thirty) days.  Patient not taking: Reported on 05/31/2021 10/05/18   [provider]  EPINEPHrine 0.3 mg/0.3 mL IJ SOAJ injection Inject 0.3 mg into the muscle as needed (for anaphylaxis).    [provider]  escitalopram (LEXAPRO) 10 MG tablet Take 10 mg by mouth daily.    [provider]  escitalopram (LEXAPRO) 10 MG tablet Take 1 tablet (10 mg total) by mouth daily. 09/03/21     ferrous sulfate 325 (65 FE) MG tablet Take 325 mg by mouth daily with breakfast.     [provider]  fluticasone (FLONASE) 50 MCG/ACT nasal spray Place 1 spray into both nostrils daily.     [provider]  furosemide (LASIX) 20 MG tablet TAKE 1 TABLET (20 MG TOTAL) BY MOUTH ONCE DAILY AS NEEDED FOR EDEMA 05/01/20 05/01/21  Curtis Sites III, MD  HYDROcodone-acetaminophen (NORCO/VICODIN) 5-325 MG tablet Take 1 tablet by mouth every 6 (six) hours as needed for moderate pain. Patient not taking: Reported on 05/31/2021 10/25/18   Duanne Guess, MD  ibuprofen (ADVIL) 800 MG tablet Take 1 tablet (800 mg total) by mouth every 8 (eight) hours as needed. 10/25/18   Duanne Guess, MD  Lifitegrast 5 % SOLN Place 1 drop into both eyes 2 (two) times a day.     [provider]  Liraglutide -Weight Management (SAXENDA) 18 MG/3ML SOPN Inject into the skin daily.    [provider]  meclizine (ANTIVERT) 25 MG tablet Take 25 mg by mouth 3 (three) times daily as needed for dizziness.    [provider]  metroNIDAZOLE (FLAGYL) 500 MG tablet Take 1 tablet (500 mg total) by mouth 3 (three) times daily for 7 days 08/02/22     mometasone (ELOCON) 0.1 % ointment Apply BID for 2  weeks then QOHS 04/01/20   Deirdre Evener, MD  Multiple Vitamins-Minerals (EMERGEN-C IMMUNE PO) Take 1 Package by mouth 3 (three) times a week.     [provider]  Multiple Vitamins-Minerals (MULTIVITAMIN WITH MINERALS) tablet Take 1 tablet by mouth daily.    [provider]  omeprazole (PRILOSEC) 40 MG capsule Take 1 to 2 capsules by mouth once daily 03/15/22 03/15/23    polycarbophil (FIBERCON) 625 MG tablet Take 625 mg by mouth daily.    [provider]  polyethylene glycol-electrolytes (NULYTELY) 420 g solution Used as directed. 03/17/21     potassium chloride (KLOR-CON) 10 MEQ tablet TAKE 1 TABLET (10  MEQ TOTAL) BY MOUTH ONCE DAILY AS NEEDED (WITH FUROSEMIDE) 05/01/20 05/01/21  Curtis Sites III, MD  prochlorperazine (COMPAZINE) 10 MG tablet Take 10 mg by mouth every 6 (six) hours as needed for nausea or vomiting.    [provider]  tirzepatide [facility]) 10 MG/0.5ML Pen Inject 10 mg subcutaneously every 7 (seven) days 10/27/21     tirzepatide (MOUNJARO) 12.5 MG/0.5ML Pen Inject 12.5 mg subcutaneously every 7 (seven) days 12/20/21     tirzepatide [facility]) 15 MG/0.5ML Pen Inject 15 mg subcutaneously every 7 (seven) days 02/15/22     tirzepatide [facility]) 2.5 MG/0.5ML Pen Inject 2.5 mg subcutaneously every 7 (seven) days 06/23/21     tirzepatide (MOUNJARO) 5 MG/0.5ML Pen Inject 0.5 mLs (5 mg total) subcutaneously every 7 (seven) days 07/28/21     tirzepatide (MOUNJARO) 7.5 MG/0.5ML Pen Inject 0.5 mLs (7.5 mg total) subcutaneously every 7 (seven) days 08/30/21     tirzepatide (MOUNJARO) 7.5 MG/0.5ML Pen Inject 0.5 mLs (7.5 mg total) subcutaneously every 7 (seven) days 09/23/21     tirzepatide (MOUNJARO) 7.5 MG/0.5ML Pen Inject 0.5 mLs (7.5 mg total) subcutaneously every 7 (seven) days 10/25/21     topiramate (TOPAMAX) 50 MG tablet Take 50 mg by mouth at bedtime.  08/22/18   [provider]  Vitamin D, Ergocalciferol, (DRISDOL) 1.25 MG (50000 UNIT) CAPS capsule  Take by mouth. 12/01/21     vitamin E 180 MG (400 UNITS) capsule Take 400 Units by mouth daily.    [provider]    Family History Family History  Problem Relation Age of Onset   Diabetes Mother    Hypertension Mother    Cancer Mother    Asthma Mother    Diabetes Father    Hyperlipidemia Father    Hypertension Father    Kidney disease Father    Heart disease Father    Diabetes Maternal Aunt    Diabetes Maternal Uncle    Diabetes Paternal Aunt    Diabetes Paternal Uncle    Breast cancer Maternal Grandmother 50   Breast cancer Paternal Grandmother     Social History Social History   Tobacco Use   Smoking status: Never   Smokeless tobacco: Never  Vaping Use   Vaping Use: Never used  Substance Use Topics   Alcohol use: No    Alcohol/week: 0.0 standard drinks of alcohol   Drug use: No     Allergies   Bee venom, Influenza vac split [influenza virus vaccine], Dilaudid [hydromorphone], Metformin, Orange fruit [citrus], and Penicillins   Review of Systems Review of Systems  Genitourinary:  Positive for flank pain.  Musculoskeletal:  Positive for back pain and myalgias.  All other systems reviewed and are negative.    Physical Exam Triage Vital Signs ED Triage Vitals  Enc Vitals Group     BP 08/16/22 1820 119/77     Pulse Rate 08/16/22 1820 93     Resp 08/16/22 1820 18     Temp 08/16/22 1820 98.8 F (37.1 C)     Temp Source 08/16/22 1820 Oral     SpO2 08/16/22 1820 96 %     Weight --      Height --      Head Circumference --      Peak Flow --      Pain Score 08/16/22 1818 3     Pain Loc --      Pain Edu? --      Excl. in GC? --  No data found.  Updated Vital Signs BP 119/77 (BP Location: Left Arm)   Pulse 93   Temp 98.8 F (37.1 C) (Oral)   Resp 18   SpO2 96%   Visual Acuity Right Eye Distance:   Left Eye Distance:   Bilateral Distance:    Right Eye Near:   Left Eye Near:    Bilateral Near:     Physical Exam Vitals and  nursing note reviewed.  Constitutional:      General: She is not in acute distress.    Appearance: She is well-developed.  HENT:     Head: Normocephalic and atraumatic.  Eyes:     Conjunctiva/sclera: Conjunctivae normal.  Cardiovascular:     Rate and Rhythm: Normal rate and regular rhythm.     Heart sounds: No murmur heard. Pulmonary:     Effort: Pulmonary effort is normal. No respiratory distress.     Breath sounds: Normal breath sounds.  Abdominal:     Palpations: Abdomen is soft.     Tenderness: There is no abdominal tenderness.  Musculoskeletal:        General: No swelling.       Arms:     Cervical back: Neck supple.  Skin:    General: Skin is warm and dry.     Capillary Refill: Capillary refill takes less than 2 seconds.  Neurological:     General: No focal deficit present.     Mental Status: She is alert and oriented to person, place, and time.     GCS: GCS eye subscore is 4. GCS verbal subscore is 5. GCS motor subscore is 6.  Psychiatric:        Attention and Perception: Attention normal.        Mood and Affect: Mood normal.        Speech: Speech normal.        Behavior: Behavior normal.      UC Treatments / Results  Labs (all labs ordered are listed, but only abnormal results are displayed) Labs Reviewed  URINALYSIS, W/ REFLEX TO CULTURE (INFECTION SUSPECTED) - Abnormal; Notable for the following components:      Result Value   Bacteria, UA FEW (*)    All other components within normal limits    EKG   Radiology No results found.  Procedures Procedures (including critical care time)  Medications Ordered in UC Medications  ketorolac (TORADOL) 30 MG/ML injection 30 mg (30 mg Intramuscular Given 08/16/22 1849)    Initial Impression / Assessment and Plan / UC Course  I have reviewed the triage vital signs and the nursing notes.  Pertinent labs & imaging results that were available during my care of the patient were reviewed by me and considered in my  medical decision making (see chart for details).  Clinical Course as of 08/16/22 1954  Tue Aug 16, 2022  1845 TORADOL ORDERED FOR PAIN [JD]  1910 Pt improved after toradol,will d/c home [JD]    Clinical Course User Index [JD] Xzayvier Fagin, Para March, NP    Ddx: Musculoskeletal pain, kidney stone, diverticulosis Final Clinical Impressions(s) / UC Diagnoses   Final diagnoses:  Side pain  Musculoskeletal pain     Discharge Instructions      Follow up with Dr. Graciela Husbands regarding outpt Ct scan. Take Zanaflex aqs prescribed. Your urine was negative for acute UTI, you received Toradol in urgent care. Go to ER for new or worsening issues or concerns.    ED Prescriptions     Medication  Sig Dispense Auth. Provider   tiZANidine (ZANAFLEX) 4 MG tablet Take 1 tablet (4 mg total) by mouth every 8 (eight) hours as needed for up to 5 days for muscle spasms. 15 tablet Jaden Batchelder, Para March, NP      PDMP not reviewed this encounter.   Clancy Gourd, NP 08/16/22 1954

## 2022-08-17 ENCOUNTER — Other Ambulatory Visit: Payer: Self-pay

## 2022-08-23 ENCOUNTER — Other Ambulatory Visit: Payer: Self-pay

## 2022-08-23 DIAGNOSIS — J4520 Mild intermittent asthma, uncomplicated: Secondary | ICD-10-CM | POA: Diagnosis not present

## 2022-08-23 DIAGNOSIS — R1012 Left upper quadrant pain: Secondary | ICD-10-CM | POA: Diagnosis not present

## 2022-08-23 DIAGNOSIS — E119 Type 2 diabetes mellitus without complications: Secondary | ICD-10-CM | POA: Diagnosis not present

## 2022-08-23 DIAGNOSIS — M5414 Radiculopathy, thoracic region: Secondary | ICD-10-CM | POA: Diagnosis not present

## 2022-08-23 MED ORDER — PREGABALIN 25 MG PO CAPS
25.0000 mg | ORAL_CAPSULE | Freq: Two times a day (BID) | ORAL | 5 refills | Status: DC
  Filled 2022-08-23: qty 60, 30d supply, fill #0
  Filled 2022-10-09: qty 60, 30d supply, fill #1
  Filled 2022-11-16 (×2): qty 30, 15d supply, fill #2
  Filled 2023-01-03: qty 60, 30d supply, fill #3
  Filled 2023-03-06: qty 60, 30d supply, fill #4
  Filled 2023-05-16: qty 60, 30d supply, fill #5

## 2022-08-26 ENCOUNTER — Other Ambulatory Visit: Payer: Self-pay | Admitting: Internal Medicine

## 2022-08-26 DIAGNOSIS — R1012 Left upper quadrant pain: Secondary | ICD-10-CM

## 2022-08-29 ENCOUNTER — Ambulatory Visit
Admission: RE | Admit: 2022-08-29 | Discharge: 2022-08-29 | Disposition: A | Discharge status: Home / Self Care | Payer: 59 | Source: Ambulatory Visit | Attending: Internal Medicine | Admitting: Internal Medicine

## 2022-08-29 DIAGNOSIS — R109 Unspecified abdominal pain: Secondary | ICD-10-CM | POA: Diagnosis not present

## 2022-08-29 DIAGNOSIS — R1012 Left upper quadrant pain: Secondary | ICD-10-CM | POA: Diagnosis not present

## 2022-08-29 DIAGNOSIS — K760 Fatty (change of) liver, not elsewhere classified: Secondary | ICD-10-CM | POA: Diagnosis not present

## 2022-08-29 LAB — POCT I-STAT CREATININE: Creatinine, Ser: 0.8 mg/dL (ref 0.44–1.00)

## 2022-08-29 MED ORDER — IOHEXOL 300 MG/ML  SOLN
100.0000 mL | Freq: Once | INTRAMUSCULAR | Status: AC | PRN
  Administered 2022-08-29: 100 mL via INTRAVENOUS

## 2022-08-29 MED ORDER — BARIUM SULFATE 2 % PO SUSP
450.0000 mL | Freq: Once | ORAL | Status: AC
  Administered 2022-08-29 (×2): 450 mL via ORAL

## 2022-09-02 ENCOUNTER — Other Ambulatory Visit: Payer: Self-pay

## 2022-09-08 NOTE — Patient Instructions (Addendum)
Your procedure is scheduled on:09-22-22 Thursday Report to the Registration Desk on the 1st floor of the [facility].Then proceed to the 2nd floor Surgery Desk To find out your arrival time, please call [phone] between 1PM - 3PM on:09-21-22 Wednesday If your arrival time is 6:00 am, do not arrive before that time as the [facility] entrance doors do not open until 6:00 am.  REMEMBER: Instructions that are not followed completely may result in serious medical risk, up to and including death; or upon the discretion of your surgeon and anesthesiologist your surgery may need to be rescheduled.  Do not eat food after midnight the night before surgery.  No gum chewing or hard candies.  You may however, drink Water up to 2 hours before you are scheduled to arrive for your surgery. Do not drink anything within 2 hours of your scheduled arrival time.  In addition, your doctor has ordered for you to drink the provided:  Gatorade G2 Drinking this carbohydrate drink up to two hours before surgery helps to reduce insulin resistance and improve patient outcomes. Please complete drinking 2 hours before scheduled arrival time.  One week prior to surgery:Last dose will be on 09-14-22 Stop Anti-inflammatories (NSAIDS) such as Advil, Aleve, Ibuprofen, Motrin, Naproxen, Naprosyn and Aspirin based products such as Excedrin, Goody's Powder, BC Powder.You may however, take Tylenol if needed for pain up until the day of surgery. Stop ANY OVER THE COUNTER supplements/vitamins 7 days prior to surgery (Vitamin E, D, Multivitamin, ferrous sulfate, biotin)-you may continue your Benefiber  Continue taking all prescribed medications with the exception of the following: -Stop your tirzepatide [facility]) 7 days prior to surgery-Last dose will be on 09-14-22 Wednesday -Do NOT take again until AFTER your surgery  TAKE ONLY THESE MEDICATIONS THE MORNING OF SURGERY WITH A SIP OF WATER: -busPIRone (BUSPAR)  -escitalopram  (LEXAPRO)  -omeprazole (PRILOSEC)-take one the night before and one the morning of surgery -fluticasone (FLONASE) nasal spray  Use your albuterol (VENTOLIN HFA) the morning of surgery and bring your Inhaler to the hospital  No Alcohol for 24 hours before or after surgery.  No Smoking including e-cigarettes for 24 hours before surgery.  No chewable tobacco products for at least 6 hours before surgery.  No nicotine patches on the day of surgery.  Do not use any "recreational" drugs for at least a week (preferably 2 weeks) before your surgery.  Please be advised that the combination of cocaine and anesthesia may have negative outcomes, up to and including death. If you test positive for cocaine, your surgery will be cancelled.  On the morning of surgery brush your teeth with toothpaste and water, you may rinse your mouth with mouthwash if you wish. Do not swallow any toothpaste or mouthwash.  Use CHG Soap or wipes as directed on instruction sheet.  Do not wear jewelry, make-up, hairpins, clips or nail polish.  Do not wear lotions, powders, or perfumes.   Do not shave body hair from the neck down 48 hours before surgery.  Contact lenses, hearing aids and dentures may not be worn into surgery.  Do not bring valuables to the hospital. [facility] is not responsible for any missing/lost belongings or valuables.   Total Shoulder Arthroplasty:  use Benzoyl Peroxide 5% Gel as directed on instruction sheet.  Bring your C-PAP to the hospital in case you may have to spend the night.   Notify your doctor if there is any change in your medical condition (cold, fever, infection).  Wear comfortable clothing (specific to your surgery type) to the hospital.  After surgery, you can help prevent lung complications by doing breathing exercises.  Take deep breaths and cough every 1-2 hours. Your doctor may order a device called an Incentive Spirometer to help you take deep breaths. When coughing or  sneezing, hold a pillow firmly against your incision with both hands. This is called "splinting." Doing this helps protect your incision. It also decreases belly discomfort.  If you are being admitted to the hospital overnight, leave your suitcase in the car. After surgery it may be brought to your room.  In case of increased patient census, it may be necessary for you, the patient, to continue your postoperative care in the Same Day Surgery department.  If you are being discharged the day of surgery, you will not be allowed to drive home. You will need a responsible individual to drive you home and stay with you for 24 hours after surgery.   If you are taking public transportation, you will need to have a responsible individual with you.  Please call the Pre-admissions Testing Dept. at [phone] if you have any questions about these instructions.  Surgery Visitation Policy:  Patients having surgery or a procedure may have two visitors.  Children under the age of 57 must have an adult with them who is not the patient.  Inpatient Visitation:    Visiting hours are 7 a.m. to 8 p.m. Up to four visitors are allowed at one time in a patient room. The visitors may rotate out with other people during the day.  One visitor age 97 or older may stay with the patient overnight and must be in the room by 8 p.m.    Pre-operative 5 CHG Bath Instructions   You can play a key role in reducing the risk of infection after surgery. Your skin needs to be as free of germs as possible. You can reduce the number of germs on your skin by washing with CHG (chlorhexidine gluconate) soap before surgery. CHG is an antiseptic soap that kills germs and continues to kill germs even after washing.   DO NOT use if you have an allergy to chlorhexidine/CHG or antibacterial soaps. If your skin becomes reddened or irritated, stop using the CHG and notify one of our RNs at [phone].   Please shower with the CHG  soap starting 4 days before surgery using the following schedule:     Please keep in mind the following:  DO NOT shave, including legs and underarms, starting the day of your first shower.   You may shave your face at any point before/day of surgery.  Place clean sheets on your bed the day you start using CHG soap. Use a clean washcloth (not used since being washed) for each shower. DO NOT sleep with pets once you start using the CHG.   CHG Shower Instructions:  If you choose to wash your hair and private area, wash first with your normal shampoo/soap.  After you use shampoo/soap, rinse your hair and body thoroughly to remove shampoo/soap residue.  Turn the water OFF and apply about 3 tablespoons (45 ml) of CHG soap to a CLEAN washcloth.  Apply CHG soap ONLY FROM YOUR NECK DOWN TO YOUR TOES (washing for 3-5 minutes)  DO NOT use CHG soap on face, private areas, open wounds, or sores.  Pay special attention to the area where your surgery is being performed.  If you are having back surgery, having  someone wash your back for you may be helpful. Wait 2 minutes after CHG soap is applied, then you may rinse off the CHG soap.  Pat dry with a clean towel  Put on clean clothes/pajamas   If you choose to wear lotion, please use ONLY the CHG-compatible lotions on the back of this paper.     Additional instructions for the day of surgery: DO NOT APPLY any lotions, deodorants, cologne, or perfumes.   Put on clean/comfortable clothes.  Brush your teeth.  Ask your nurse before applying any prescription medications to the skin.      CHG Compatible Lotions   Aveeno Moisturizing lotion  Cetaphil Moisturizing Cream  Cetaphil Moisturizing Lotion  Clairol Herbal Essence Moisturizing Lotion, Dry Skin  Clairol Herbal Essence Moisturizing Lotion, Extra Dry Skin  Clairol Herbal Essence Moisturizing Lotion, Normal Skin  Curel Age Defying Therapeutic Moisturizing Lotion with Alpha Hydroxy  Curel  Extreme Care Body Lotion  Curel Soothing Hands Moisturizing Hand Lotion  Curel Therapeutic Moisturizing Cream, Fragrance-Free  Curel Therapeutic Moisturizing Lotion, Fragrance-Free  Curel Therapeutic Moisturizing Lotion, Original Formula  Eucerin Daily Replenishing Lotion  Eucerin Dry Skin Therapy Plus Alpha Hydroxy Crme  Eucerin Dry Skin Therapy Plus Alpha Hydroxy Lotion  Eucerin Original Crme  Eucerin Original Lotion  Eucerin Plus Crme Eucerin Plus Lotion  Eucerin TriLipid Replenishing Lotion  Keri Anti-Bacterial Hand Lotion  Keri Deep Conditioning Original Lotion Dry Skin Formula Softly Scented  Keri Deep Conditioning Original Lotion, Fragrance Free Sensitive Skin Formula  Keri Lotion Fast Absorbing Fragrance Free Sensitive Skin Formula  Keri Lotion Fast Absorbing Softly Scented Dry Skin Formula  Keri Original Lotion  Keri Skin Renewal Lotion Keri Silky Smooth Lotion  Keri Silky Smooth Sensitive Skin Lotion  Nivea Body Creamy Conditioning Oil  Nivea Body Extra Enriched Lotion  Nivea Body Original Lotion  Nivea Body Sheer Moisturizing Lotion Nivea Crme  Nivea Skin Firming Lotion  NutraDerm 30 Skin Lotion  NutraDerm Skin Lotion  NutraDerm Therapeutic Skin Cream  NutraDerm Therapeutic Skin Lotion  ProShield Protective Hand Cream  Provon moisturizing lotion  Pre-operative 5 CHG Bath Instructions   You can play a key role in reducing the risk of infection after surgery. Your skin needs to be as free of germs as possible. You can reduce the number of germs on your skin by washing with CHG (chlorhexidine gluconate) soap before surgery. CHG is an antiseptic soap that kills germs and continues to kill germs even after washing.   DO NOT use if you have an allergy to chlorhexidine/CHG or antibacterial soaps. If your skin becomes reddened or irritated, stop using the CHG and notify one of our RNs at [phone].   Please shower with the CHG soap starting 4 days before surgery using  the following schedule:     Please keep in mind the following:  DO NOT shave, including legs and underarms, starting the day of your first shower.   You may shave your face at any point before/day of surgery.  Place clean sheets on your bed the day you start using CHG soap. Use a clean washcloth (not used since being washed) for each shower. DO NOT sleep with pets once you start using the CHG.   CHG Shower Instructions:  If you choose to wash your hair and private area, wash first with your normal shampoo/soap.  After you use shampoo/soap, rinse your hair and body thoroughly to remove shampoo/soap residue.  Turn the water OFF and apply about 3 tablespoons (45 ml)  of CHG soap to a CLEAN washcloth.  Apply CHG soap ONLY FROM YOUR NECK DOWN TO YOUR TOES (washing for 3-5 minutes)  DO NOT use CHG soap on face, private areas, open wounds, or sores.  Pay special attention to the area where your surgery is being performed.  If you are having back surgery, having someone wash your back for you may be helpful. Wait 2 minutes after CHG soap is applied, then you may rinse off the CHG soap.  Pat dry with a clean towel  Put on clean clothes/pajamas   If you choose to wear lotion, please use ONLY the CHG-compatible lotions on the back of this paper.     Additional instructions for the day of surgery: DO NOT APPLY any lotions, deodorants, cologne, or perfumes.   Put on clean/comfortable clothes.  Brush your teeth.  Ask your nurse before applying any prescription medications to the skin.      CHG Compatible Lotions   Aveeno Moisturizing lotion  Cetaphil Moisturizing Cream  Cetaphil Moisturizing Lotion  Clairol Herbal Essence Moisturizing Lotion, Dry Skin  Clairol Herbal Essence Moisturizing Lotion, Extra Dry Skin  Clairol Herbal Essence Moisturizing Lotion, Normal Skin  Curel Age Defying Therapeutic Moisturizing Lotion with Alpha Hydroxy  Curel Extreme Care Body Lotion  Curel Soothing Hands  Moisturizing Hand Lotion  Curel Therapeutic Moisturizing Cream, Fragrance-Free  Curel Therapeutic Moisturizing Lotion, Fragrance-Free  Curel Therapeutic Moisturizing Lotion, Original Formula  Eucerin Daily Replenishing Lotion  Eucerin Dry Skin Therapy Plus Alpha Hydroxy Crme  Eucerin Dry Skin Therapy Plus Alpha Hydroxy Lotion  Eucerin Original Crme  Eucerin Original Lotion  Eucerin Plus Crme Eucerin Plus Lotion  Eucerin TriLipid Replenishing Lotion  Keri Anti-Bacterial Hand Lotion  Keri Deep Conditioning Original Lotion Dry Skin Formula Softly Scented  Keri Deep Conditioning Original Lotion, Fragrance Free Sensitive Skin Formula  Keri Lotion Fast Absorbing Fragrance Free Sensitive Skin Formula  Keri Lotion Fast Absorbing Softly Scented Dry Skin Formula  Keri Original Lotion  Keri Skin Renewal Lotion Keri Silky Smooth Lotion  Keri Silky Smooth Sensitive Skin Lotion  Nivea Body Creamy Conditioning Oil  Nivea Body Extra Enriched Lotion  Nivea Body Original Lotion  Nivea Body Sheer Moisturizing Lotion Nivea Crme  Nivea Skin Firming Lotion  NutraDerm 30 Skin Lotion  NutraDerm Skin Lotion  NutraDerm Therapeutic Skin Cream  NutraDerm Therapeutic Skin Lotion  ProShield Protective Hand Cream  Provon moisturizing lotion  How to Use an Incentive Spirometer An incentive spirometer is a tool that measures how well you are filling your lungs with each breath. Learning to take long, deep breaths using this tool can help you keep your lungs clear and active. This may help to reverse or lessen your chance of developing breathing (pulmonary) problems, especially infection. You may be asked to use a spirometer: After a surgery. If you have a lung problem or a history of smoking. After a long period of time when you have been unable to move or be active. If the spirometer includes an indicator to show the highest number that you have reached, your health care provider or respiratory therapist  will help you set a goal. Keep a log of your progress as told by your health care provider. What are the risks? Breathing too quickly may cause dizziness or cause you to pass out. Take your time so you do not get dizzy or light-headed. If you are in pain, you may need to take pain medicine before doing incentive spirometry. It is harder to  take a deep breath if you are having pain. How to use your incentive spirometer  Sit up on the edge of your bed or on a chair. Hold the incentive spirometer so that it is in an upright position. Before you use the spirometer, breathe out normally. Place the mouthpiece in your mouth. Make sure your lips are closed tightly around it. Breathe in slowly and as deeply as you can through your mouth, causing the piston or the ball to rise toward the top of the chamber. Hold your breath for 3-5 seconds, or for as long as possible. If the spirometer includes a coach indicator, use this to guide you in breathing. Slow down your breathing if the indicator goes above the marked areas. Remove the mouthpiece from your mouth and breathe out normally. The piston or ball will return to the bottom of the chamber. Rest for a few seconds, then repeat the steps 10 or more times. Take your time and take a few normal breaths between deep breaths so that you do not get dizzy or light-headed. Do this every 1-2 hours when you are awake. If the spirometer includes a goal marker to show the highest number you have reached (best effort), use this as a goal to work toward during each repetition. After each set of 10 deep breaths, cough a few times. This will help to make sure that your lungs are clear. If you have an incision on your chest or abdomen from surgery, place a pillow or a rolled-up towel firmly against the incision when you cough. This can help to reduce pain while taking deep breaths and coughing. General tips When you are able to get out of bed: Walk around often. Continue  to take deep breaths and cough in order to clear your lungs. Keep using the incentive spirometer until your health care provider says it is okay to stop using it. If you have been in the hospital, you may be told to keep using the spirometer at home. Contact a health care provider if: You are having difficulty using the spirometer. You have trouble using the spirometer as often as instructed. Your pain medicine is not giving enough relief for you to use the spirometer as told. You have a fever. Get help right away if: You develop shortness of breath. You develop a cough with bloody mucus from the lungs. You have fluid or blood coming from an incision site after you cough. Summary An incentive spirometer is a tool that can help you learn to take long, deep breaths to keep your lungs clear and active. You may be asked to use a spirometer after a surgery, if you have a lung problem or a history of smoking, or if you have been inactive for a long period of time. Use your incentive spirometer as instructed every 1-2 hours while you are awake. If you have an incision on your chest or abdomen, place a pillow or a rolled-up towel firmly against your incision when you cough. This will help to reduce pain. Get help right away if you have shortness of breath, you cough up bloody mucus, or blood comes from your incision when you cough. This information is not intended to replace advice given to you by your health care provider. Make sure you discuss any questions you have with your health care provider. Document Revised: 06/03/2019 Document Reviewed: 06/03/2019 Elsevier Patient Education  2024 [employer].

## 2022-09-09 ENCOUNTER — Other Ambulatory Visit: Payer: 59

## 2022-09-09 ENCOUNTER — Encounter
Admission: RE | Admit: 2022-09-09 | Discharge: 2022-09-09 | Disposition: A | Discharge status: Home / Self Care | Payer: 59 | Source: Ambulatory Visit | Attending: Orthopedic Surgery | Admitting: Orthopedic Surgery

## 2022-09-09 VITALS — BP 102/77 | HR 90 | Resp 14 | Ht 65.0 in | Wt 205.3 lb

## 2022-09-09 DIAGNOSIS — Z79899 Other long term (current) drug therapy: Secondary | ICD-10-CM | POA: Insufficient documentation

## 2022-09-09 DIAGNOSIS — Z01818 Encounter for other preprocedural examination: Secondary | ICD-10-CM | POA: Insufficient documentation

## 2022-09-09 HISTORY — DX: Unspecified convulsions: R56.9

## 2022-09-09 HISTORY — DX: Acquired absence of other organs: Z90.89

## 2022-09-09 HISTORY — DX: Diverticulosis of intestine, part unspecified, without perforation or abscess without bleeding: K57.90

## 2022-09-09 HISTORY — DX: Other specified postprocedural states: Z98.890

## 2022-09-09 HISTORY — DX: Hyperparathyroidism, unspecified: E21.3

## 2022-09-09 HISTORY — DX: Asymptomatic varicose veins of unspecified lower extremity: I83.90

## 2022-09-09 HISTORY — DX: Lymphedema, not elsewhere classified: I89.0

## 2022-09-09 HISTORY — DX: Other complications of anesthesia, initial encounter: T88.59XA

## 2022-09-09 LAB — TYPE AND SCREEN
ABO/RH(D): O POS
Antibody Screen: NEGATIVE

## 2022-09-09 LAB — SURGICAL PCR SCREEN
MRSA, PCR: NEGATIVE
Staphylococcus aureus: NEGATIVE

## 2022-09-09 LAB — URINALYSIS, ROUTINE W REFLEX MICROSCOPIC
Bilirubin Urine: NEGATIVE
Glucose, UA: NEGATIVE mg/dL
Hgb urine dipstick: NEGATIVE
Ketones, ur: 5 mg/dL — AB
Leukocytes,Ua: NEGATIVE
Nitrite: NEGATIVE
Protein, ur: NEGATIVE mg/dL
Specific Gravity, Urine: 1.029 (ref 1.005–1.030)
pH: 5 (ref 5.0–8.0)

## 2022-09-15 ENCOUNTER — Other Ambulatory Visit: Payer: Self-pay

## 2022-09-22 ENCOUNTER — Other Ambulatory Visit: Payer: Self-pay

## 2022-09-22 ENCOUNTER — Ambulatory Visit: Payer: 59 | Admitting: Urgent Care

## 2022-09-22 ENCOUNTER — Encounter: Payer: Self-pay | Admitting: Orthopedic Surgery

## 2022-09-22 ENCOUNTER — Ambulatory Visit: Payer: 59 | Admitting: Anesthesiology

## 2022-09-22 ENCOUNTER — Encounter
Admission: RE | Disposition: A | Discharge status: Home Health | Payer: Self-pay | Source: Home / Self Care | Attending: Orthopedic Surgery

## 2022-09-22 ENCOUNTER — Ambulatory Visit: Payer: 59

## 2022-09-22 ENCOUNTER — Observation Stay
Admission: RE | Admit: 2022-09-22 | Discharge: 2022-09-23 | Disposition: A | Discharge status: Home Health | Payer: 59 | Attending: Orthopedic Surgery | Admitting: Orthopedic Surgery

## 2022-09-22 DIAGNOSIS — M1612 Unilateral primary osteoarthritis, left hip: Secondary | ICD-10-CM | POA: Insufficient documentation

## 2022-09-22 DIAGNOSIS — Z79899 Other long term (current) drug therapy: Secondary | ICD-10-CM | POA: Insufficient documentation

## 2022-09-22 DIAGNOSIS — E119 Type 2 diabetes mellitus without complications: Secondary | ICD-10-CM | POA: Diagnosis not present

## 2022-09-22 DIAGNOSIS — M12552 Traumatic arthropathy, left hip: Secondary | ICD-10-CM | POA: Diagnosis not present

## 2022-09-22 DIAGNOSIS — M1652 Unilateral post-traumatic osteoarthritis, left hip: Secondary | ICD-10-CM | POA: Diagnosis not present

## 2022-09-22 DIAGNOSIS — Z01818 Encounter for other preprocedural examination: Secondary | ICD-10-CM

## 2022-09-22 DIAGNOSIS — Z96642 Presence of left artificial hip joint: Secondary | ICD-10-CM | POA: Diagnosis not present

## 2022-09-22 DIAGNOSIS — J45909 Unspecified asthma, uncomplicated: Secondary | ICD-10-CM | POA: Insufficient documentation

## 2022-09-22 HISTORY — PX: TOTAL HIP ARTHROPLASTY: SHX124

## 2022-09-22 LAB — GLUCOSE, CAPILLARY
Glucose-Capillary: 102 mg/dL — ABNORMAL HIGH (ref 70–99)
Glucose-Capillary: 94 mg/dL (ref 70–99)

## 2022-09-22 LAB — ABO/RH: ABO/RH(D): O POS

## 2022-09-22 SURGERY — ARTHROPLASTY, HIP, TOTAL,POSTERIOR APPROACH
Anesthesia: Spinal | Site: Hip | Laterality: Left

## 2022-09-22 MED ORDER — METOCLOPRAMIDE HCL 5 MG PO TABS: 5.0000 mg | ORAL_TABLET | Freq: Three times a day (TID) | ORAL | Status: DC | PRN

## 2022-09-22 MED ORDER — DOCUSATE SODIUM 100 MG PO CAPS
ORAL_CAPSULE | ORAL | Status: AC
  Filled 2022-09-22: qty 1

## 2022-09-22 MED ORDER — BUPIVACAINE HCL (PF) 0.5 % IJ SOLN
INTRAMUSCULAR | Status: AC
  Filled 2022-09-22: qty 10

## 2022-09-22 MED ORDER — CHLORHEXIDINE GLUCONATE 0.12 % MT SOLN
OROMUCOSAL | Status: AC
  Filled 2022-09-22: qty 15

## 2022-09-22 MED ORDER — FERROUS SULFATE 325 (65 FE) MG PO TABS
325.0000 mg | ORAL_TABLET | Freq: Every day | ORAL | Status: DC
  Administered 2022-09-23: 325 mg via ORAL

## 2022-09-22 MED ORDER — BUPIVACAINE HCL (PF) 0.5 % IJ SOLN
INTRAMUSCULAR | Status: DC | PRN
  Administered 2022-09-22: 3 mL

## 2022-09-22 MED ORDER — TRANEXAMIC ACID-NACL 1000-0.7 MG/100ML-% IV SOLN
INTRAVENOUS | Status: AC
  Filled 2022-09-22: qty 100

## 2022-09-22 MED ORDER — CEFAZOLIN SODIUM-DEXTROSE 2-4 GM/100ML-% IV SOLN
INTRAVENOUS | Status: AC
  Filled 2022-09-22: qty 100

## 2022-09-22 MED ORDER — PHENYLEPHRINE 80 MCG/ML (10ML) SYRINGE FOR IV PUSH (FOR BLOOD PRESSURE SUPPORT)
PREFILLED_SYRINGE | INTRAVENOUS | Status: AC
  Filled 2022-09-22: qty 10

## 2022-09-22 MED ORDER — MIDAZOLAM HCL 5 MG/5ML IJ SOLN
INTRAMUSCULAR | Status: DC | PRN
  Administered 2022-09-22: 2 mg via INTRAVENOUS

## 2022-09-22 MED ORDER — CEFAZOLIN SODIUM-DEXTROSE 2-4 GM/100ML-% IV SOLN
2.0000 g | INTRAVENOUS | Status: AC
  Administered 2022-09-22: 2 g via INTRAVENOUS

## 2022-09-22 MED ORDER — ACETAMINOPHEN 500 MG PO TABS: 1000.0000 mg | ORAL_TABLET | Freq: Three times a day (TID) | ORAL | Status: DC

## 2022-09-22 MED ORDER — ALBUTEROL SULFATE (2.5 MG/3ML) 0.083% IN NEBU: 3.0000 mL | INHALATION_SOLUTION | Freq: Four times a day (QID) | RESPIRATORY_TRACT | Status: DC | PRN

## 2022-09-22 MED ORDER — OXYCODONE HCL 5 MG PO TABS: 5.0000 mg | ORAL_TABLET | Freq: Once | ORAL | Status: DC | PRN

## 2022-09-22 MED ORDER — HYDROCODONE-ACETAMINOPHEN 5-325 MG PO TABS
ORAL_TABLET | ORAL | Status: AC
  Filled 2022-09-22: qty 1

## 2022-09-22 MED ORDER — BUSPIRONE HCL 15 MG PO TABS
7.5000 mg | ORAL_TABLET | Freq: Every day | ORAL | Status: DC
  Administered 2022-09-23: 7.5 mg via ORAL
  Filled 2022-09-22: qty 1

## 2022-09-22 MED ORDER — TRAMADOL HCL 50 MG PO TABS
ORAL_TABLET | ORAL | Status: AC
  Filled 2022-09-22: qty 1

## 2022-09-22 MED ORDER — METOCLOPRAMIDE HCL 5 MG/ML IJ SOLN: 5.0000 mg | Freq: Three times a day (TID) | INTRAMUSCULAR | Status: DC | PRN

## 2022-09-22 MED ORDER — ACETAMINOPHEN 10 MG/ML IV SOLN
1000.0000 mg | Freq: Once | INTRAVENOUS | Status: DC | PRN
  Administered 2022-09-22: 1000 mg via INTRAVENOUS

## 2022-09-22 MED ORDER — SODIUM CHLORIDE 0.9 % IV SOLN: INTRAVENOUS | Status: DC

## 2022-09-22 MED ORDER — ORAL CARE MOUTH RINSE: 15.0000 mL | Freq: Once | OROMUCOSAL | Status: AC

## 2022-09-22 MED ORDER — SODIUM CHLORIDE FLUSH 0.9 % IV SOLN
INTRAVENOUS | Status: AC
  Filled 2022-09-22: qty 30

## 2022-09-22 MED ORDER — FENTANYL CITRATE (PF) 100 MCG/2ML IJ SOLN
INTRAMUSCULAR | Status: DC | PRN
  Administered 2022-09-22: 25 ug via INTRAVENOUS

## 2022-09-22 MED ORDER — TRAMADOL HCL 50 MG PO TABS
50.0000 mg | ORAL_TABLET | Freq: Four times a day (QID) | ORAL | Status: DC | PRN
  Administered 2022-09-22: 50 mg via ORAL

## 2022-09-22 MED ORDER — POLYVINYL ALCOHOL 1.4 % OP SOLN
1.0000 [drp] | Freq: Every day | OPHTHALMIC | Status: DC
  Filled 2022-09-22: qty 15

## 2022-09-22 MED ORDER — KETOROLAC TROMETHAMINE 15 MG/ML IJ SOLN
INTRAMUSCULAR | Status: AC
  Filled 2022-09-22: qty 1

## 2022-09-22 MED ORDER — PHENYLEPHRINE HCL (PRESSORS) 10 MG/ML IV SOLN
INTRAVENOUS | Status: AC
  Filled 2022-09-22: qty 1

## 2022-09-22 MED ORDER — 0.9 % SODIUM CHLORIDE (POUR BTL) OPTIME
TOPICAL | Status: DC | PRN
  Administered 2022-09-22: 500 mL

## 2022-09-22 MED ORDER — CEFAZOLIN SODIUM-DEXTROSE 2-4 GM/100ML-% IV SOLN
2.0000 g | Freq: Four times a day (QID) | INTRAVENOUS | Status: AC
  Administered 2022-09-22 (×2): 2 g via INTRAVENOUS
  Filled 2022-09-22: qty 100

## 2022-09-22 MED ORDER — ACETAMINOPHEN 10 MG/ML IV SOLN
INTRAVENOUS | Status: AC
  Filled 2022-09-22: qty 100

## 2022-09-22 MED ORDER — HYDROCODONE-ACETAMINOPHEN 5-325 MG PO TABS
1.0000 | ORAL_TABLET | ORAL | Status: DC | PRN
  Administered 2022-09-22: 1 via ORAL
  Administered 2022-09-23 (×2): 2 via ORAL

## 2022-09-22 MED ORDER — SURGIPHOR WOUND IRRIGATION SYSTEM - OPTIME: TOPICAL | Status: DC | PRN

## 2022-09-22 MED ORDER — DROPERIDOL 2.5 MG/ML IJ SOLN: 0.6250 mg | Freq: Once | INTRAMUSCULAR | Status: DC | PRN

## 2022-09-22 MED ORDER — EPINEPHRINE PF 1 MG/ML IJ SOLN
INTRAMUSCULAR | Status: AC
  Filled 2022-09-22: qty 2

## 2022-09-22 MED ORDER — DEXAMETHASONE SODIUM PHOSPHATE 10 MG/ML IJ SOLN
8.0000 mg | Freq: Once | INTRAMUSCULAR | Status: AC
  Administered 2022-09-22: 10 mg via INTRAVENOUS

## 2022-09-22 MED ORDER — FENTANYL CITRATE (PF) 100 MCG/2ML IJ SOLN
INTRAMUSCULAR | Status: AC
  Filled 2022-09-22: qty 2

## 2022-09-22 MED ORDER — KETOROLAC TROMETHAMINE 15 MG/ML IJ SOLN
15.0000 mg | Freq: Four times a day (QID) | INTRAMUSCULAR | Status: AC
  Administered 2022-09-22 – 2022-09-23 (×4): 15 mg via INTRAVENOUS

## 2022-09-22 MED ORDER — PHENOL 1.4 % MT LIQD: 1.0000 | OROMUCOSAL | Status: DC | PRN

## 2022-09-22 MED ORDER — ENOXAPARIN SODIUM 40 MG/0.4ML IJ SOSY
40.0000 mg | PREFILLED_SYRINGE | INTRAMUSCULAR | Status: DC
  Administered 2022-09-23: 40 mg via SUBCUTANEOUS

## 2022-09-22 MED ORDER — OXYCODONE HCL 5 MG/5ML PO SOLN: 5.0000 mg | Freq: Once | ORAL | Status: DC | PRN

## 2022-09-22 MED ORDER — PREGABALIN 25 MG PO CAPS
25.0000 mg | ORAL_CAPSULE | Freq: Every day | ORAL | Status: DC
  Administered 2022-09-22: 25 mg via ORAL
  Filled 2022-09-22: qty 1

## 2022-09-22 MED ORDER — PROPOFOL 500 MG/50ML IV EMUL
INTRAVENOUS | Status: DC | PRN
  Administered 2022-09-22: 45 ug/kg/min via INTRAVENOUS

## 2022-09-22 MED ORDER — FENTANYL CITRATE (PF) 100 MCG/2ML IJ SOLN: 25.0000 ug | INTRAMUSCULAR | Status: DC | PRN

## 2022-09-22 MED ORDER — ESCITALOPRAM OXALATE 10 MG PO TABS
10.0000 mg | ORAL_TABLET | Freq: Every day | ORAL | Status: DC
  Administered 2022-09-23: 10 mg via ORAL
  Filled 2022-09-22: qty 1

## 2022-09-22 MED ORDER — MIDAZOLAM HCL 2 MG/2ML IJ SOLN
INTRAMUSCULAR | Status: AC
  Filled 2022-09-22: qty 2

## 2022-09-22 MED ORDER — BUPIVACAINE LIPOSOME 1.3 % IJ SUSP
INTRAMUSCULAR | Status: AC
  Filled 2022-09-22: qty 40

## 2022-09-22 MED ORDER — DOCUSATE SODIUM 100 MG PO CAPS
100.0000 mg | ORAL_CAPSULE | Freq: Two times a day (BID) | ORAL | Status: DC
  Administered 2022-09-22 – 2022-09-23 (×2): 100 mg via ORAL

## 2022-09-22 MED ORDER — DEXAMETHASONE SODIUM PHOSPHATE 10 MG/ML IJ SOLN
INTRAMUSCULAR | Status: AC
  Filled 2022-09-22: qty 1

## 2022-09-22 MED ORDER — SODIUM CHLORIDE 0.9 % IR SOLN
Status: DC | PRN
  Administered 2022-09-22: 250 mL
  Administered 2022-09-22: 3000 mL

## 2022-09-22 MED ORDER — PANTOPRAZOLE SODIUM 40 MG PO TBEC
40.0000 mg | DELAYED_RELEASE_TABLET | Freq: Every day | ORAL | Status: DC
  Administered 2022-09-23: 40 mg via ORAL

## 2022-09-22 MED ORDER — PHENYLEPHRINE HCL (PRESSORS) 10 MG/ML IV SOLN
INTRAVENOUS | Status: DC | PRN
  Administered 2022-09-22: 160 ug via INTRAVENOUS

## 2022-09-22 MED ORDER — PROPOFOL 1000 MG/100ML IV EMUL
INTRAVENOUS | Status: AC
  Filled 2022-09-22: qty 100

## 2022-09-22 MED ORDER — TRANEXAMIC ACID-NACL 1000-0.7 MG/100ML-% IV SOLN
1000.0000 mg | INTRAVENOUS | Status: AC
  Administered 2022-09-22 (×2): 1000 mg via INTRAVENOUS

## 2022-09-22 MED ORDER — SODIUM CHLORIDE (PF) 0.9 % IJ SOLN
INTRAMUSCULAR | Status: DC | PRN
  Administered 2022-09-22: 50 mL via INTRAMUSCULAR

## 2022-09-22 MED ORDER — EPHEDRINE 5 MG/ML INJ
INTRAVENOUS | Status: AC
  Filled 2022-09-22: qty 5

## 2022-09-22 MED ORDER — BUPIVACAINE HCL (PF) 0.25 % IJ SOLN
INTRAMUSCULAR | Status: AC
  Filled 2022-09-22: qty 60

## 2022-09-22 MED ORDER — LIFITEGRAST 5 % OP SOLN: 1.0000 [drp] | Freq: Every day | OPHTHALMIC | Status: DC

## 2022-09-22 MED ORDER — MORPHINE SULFATE (PF) 4 MG/ML IV SOLN
0.5000 mg | INTRAVENOUS | Status: DC | PRN
  Administered 2022-09-23: 1 mg via INTRAVENOUS

## 2022-09-22 MED ORDER — CHLORHEXIDINE GLUCONATE 0.12 % MT SOLN
15.0000 mL | Freq: Once | OROMUCOSAL | Status: AC
  Administered 2022-09-22: 15 mL via OROMUCOSAL

## 2022-09-22 MED ORDER — MENTHOL 3 MG MT LOZG: 1.0000 | LOZENGE | OROMUCOSAL | Status: DC | PRN

## 2022-09-22 MED ORDER — PANTOPRAZOLE SODIUM 40 MG PO TBEC
DELAYED_RELEASE_TABLET | ORAL | Status: AC
  Filled 2022-09-22: qty 1

## 2022-09-22 MED ORDER — TRANEXAMIC ACID 1000 MG/10ML IV SOLN
INTRAVENOUS | Status: AC
  Filled 2022-09-22: qty 20

## 2022-09-22 MED ORDER — EPHEDRINE SULFATE (PRESSORS) 50 MG/ML IJ SOLN
INTRAMUSCULAR | Status: DC | PRN
  Administered 2022-09-22: 10 mg via INTRAVENOUS

## 2022-09-22 SURGICAL SUPPLY — 79 items
ADH SKN CLS APL DERMABOND .7 (GAUZE/BANDAGES/DRESSINGS) ×1
APL PRP STRL LF DISP 70% ISPRP (MISCELLANEOUS) ×2
BLADE SAGITTAL AGGR TOOTH XLG (BLADE) ×1 IMPLANT
BNDG CMPR 5X6 CHSV STRCH STRL (GAUZE/BANDAGES/DRESSINGS) ×1
BNDG COHESIVE 6X5 TAN ST LF (GAUZE/BANDAGES/DRESSINGS) ×1 IMPLANT
CHLORAPREP W/TINT 26 (MISCELLANEOUS) ×2 IMPLANT
COVER BACK TABLE REUSABLE LG (DRAPES) ×1 IMPLANT
COVER SET STULBERG POSITIONER (MISCELLANEOUS) ×1 IMPLANT
DERMABOND ADVANCED .7 DNX12 (GAUZE/BANDAGES/DRESSINGS) ×1 IMPLANT
DRAPE 3/4 80X56 (DRAPES) ×1 IMPLANT
DRAPE IMP U-DRAPE 54X76 (DRAPES) ×1 IMPLANT
DRAPE INCISE IOBAN 66X60 STRL (DRAPES) ×1 IMPLANT
DRAPE POUCH INSTRU U-SHP 10X18 (DRAPES) ×1 IMPLANT
DRAPE U-SHAPE 47X51 STRL (DRAPES) ×1 IMPLANT
DRSG MEPILEX SACRM 8.7X9.8 (GAUZE/BANDAGES/DRESSINGS) ×1 IMPLANT
DRSG OPSITE POSTOP 4X10 (GAUZE/BANDAGES/DRESSINGS) IMPLANT
DRSG OPSITE POSTOP 4X12 (GAUZE/BANDAGES/DRESSINGS) IMPLANT
DRSG OPSITE POSTOP 4X8 (GAUZE/BANDAGES/DRESSINGS) IMPLANT
ELECT REM PT RETURN 9FT ADLT (ELECTROSURGICAL) ×1
ELECTRODE REM PT RTRN 9FT ADLT (ELECTROSURGICAL) ×1 IMPLANT
GLOVE BIO SURGEON STRL SZ8 (GLOVE) ×1 IMPLANT
GLOVE BIOGEL PI IND STRL 8 (GLOVE) ×1 IMPLANT
GLOVE PI ORTHO PRO STRL 7.5 (GLOVE) ×2 IMPLANT
GLOVE PI ORTHO PRO STRL SZ8 (GLOVE) ×2 IMPLANT
GLOVE SURG SYN 7.5 E (GLOVE) ×2 IMPLANT
GLOVE SURG SYN 7.5 PF PI (GLOVE) ×2 IMPLANT
GOWN SRG XL LVL 3 NONREINFORCE (GOWNS) ×1 IMPLANT
GOWN STRL NON-REIN TWL XL LVL3 (GOWNS) ×1
GOWN STRL REUS W/ TWL LRG LVL3 (GOWN DISPOSABLE) ×1 IMPLANT
GOWN STRL REUS W/ TWL XL LVL3 (GOWN DISPOSABLE) ×1 IMPLANT
GOWN STRL REUS W/TWL LRG LVL3 (GOWN DISPOSABLE) ×1
GOWN STRL REUS W/TWL XL LVL3 (GOWN DISPOSABLE) ×1
HANDLE YANKAUER SUCT OPEN TIP (MISCELLANEOUS) ×1 IMPLANT
HEAD CERAMIC FEMORAL 36MM (Head) IMPLANT
HOOD PEEL AWAY T7 (MISCELLANEOUS) ×2 IMPLANT
INSERT 0 DEGREE 36 (Miscellaneous) IMPLANT
IV NS 100ML SINGLE PACK (IV SOLUTION) ×1 IMPLANT
IV NS 250ML (IV SOLUTION) ×1
IV NS 250ML BAXH (IV SOLUTION) IMPLANT
IV NS IRRIG 3000ML ARTHROMATIC (IV SOLUTION) ×1 IMPLANT
KIT TURNOVER KIT A (KITS) ×1 IMPLANT
MANIFOLD NEPTUNE II (INSTRUMENTS) ×1 IMPLANT
MARKER SKIN DUAL TIP RULER LAB (MISCELLANEOUS) ×1 IMPLANT
MAT ABSORB FLUID 56X50 GRAY (MISCELLANEOUS) ×1 IMPLANT
NDL FILTER BLUNT 18X1 1/2 (NEEDLE) ×1 IMPLANT
NDL SAFETY ECLIP 18X1.5 (MISCELLANEOUS) ×1 IMPLANT
NDL SPNL 20GX3.5 QUINCKE YW (NEEDLE) ×1 IMPLANT
NEEDLE FILTER BLUNT 18X1 1/2 (NEEDLE) ×1 IMPLANT
NEEDLE SPNL 20GX3.5 QUINCKE YW (NEEDLE) ×1 IMPLANT
NS IRRIG 500ML POUR BTL (IV SOLUTION) IMPLANT
PACK HIP PROSTHESIS (MISCELLANEOUS) ×1 IMPLANT
PAD ARMBOARD 7.5X6 YLW CONV (MISCELLANEOUS) ×2 IMPLANT
PENCIL SMOKE EVACUATOR (MISCELLANEOUS) ×1 IMPLANT
PILLOW ABDUCTION FOAM SM (MISCELLANEOUS) ×1 IMPLANT
PULSAVAC PLUS IRRIG FAN TIP (DISPOSABLE) ×1
RETRIEVER SUT HEWSON (MISCELLANEOUS) ×1 IMPLANT
SCREW HEX LP 6.5X25 (Screw) IMPLANT
SCREW HEX LP 6.5X30 (Screw) IMPLANT
SCREW HEX LP 6.5X35 (Screw) IMPLANT
SHELL MULTIHOLE ACETAB 48D (Shell) IMPLANT
SLEEVE SCD COMPRESS KNEE MED (STOCKING) ×1 IMPLANT
SOLUTION IRRIG SURGIPHOR (IV SOLUTION) ×1 IMPLANT
STEM HIGH OFFSET SZ3 36X101 (Stem) IMPLANT
SUT BONE WAX W31G (SUTURE) ×1 IMPLANT
SUT DVC 2 QUILL PDO T11 36X36 (SUTURE) ×1 IMPLANT
SUT ETHIBOND #5 BRAIDED 30INL (SUTURE) ×1 IMPLANT
SUT QUILL MONODERM 3-0 PS-2 (SUTURE) ×1 IMPLANT
SUT VIC AB 0 CT1 36 (SUTURE) ×1 IMPLANT
SUT VIC AB 1 CT1 36 (SUTURE) IMPLANT
SUT VIC AB 2-0 CT2 27 (SUTURE) ×2 IMPLANT
SUT VICRYL 1-0 27IN ABS (SUTURE) ×1
SUTURE VICRYL 1-0 27IN ABS (SUTURE) ×1 IMPLANT
SYR 30ML LL (SYRINGE) ×2 IMPLANT
SYR TB 1ML LL NO SAFETY (SYRINGE) ×1 IMPLANT
TIP FAN IRRIG PULSAVAC PLUS (DISPOSABLE) ×1 IMPLANT
TOWEL OR 17X26 4PK STRL BLUE (TOWEL DISPOSABLE) IMPLANT
TRAP FLUID SMOKE EVACUATOR (MISCELLANEOUS) ×1 IMPLANT
WAND WEREWOLF FASTSEAL 6.0 (MISCELLANEOUS) ×1 IMPLANT
WATER STERILE IRR 1000ML POUR (IV SOLUTION) ×1 IMPLANT

## 2022-09-22 NOTE — Anesthesia Preprocedure Evaluation (Addendum)
Anesthesia Evaluation  Patient identified by MRN, date of birth, ID band Patient awake    Reviewed: Allergy & Precautions, H&P , NPO status , Patient's Chart, lab work & pertinent test results  History of Anesthesia Complications (+) PONV, PROLONGED EMERGENCE and history of anesthetic complications  Airway Mallampati: II  TM Distance: >3 FB Neck ROM: full    Dental  (+) Chipped   Pulmonary asthma , sleep apnea    Pulmonary exam normal        Cardiovascular negative cardio ROS Normal cardiovascular exam     Neuro/Psych negative neurological ROS  negative psych ROS   GI/Hepatic Neg liver ROS, hiatal hernia (small to moderate s/p gastric sleeve surgery),GERD  ,,  Endo/Other  diabetes, Type 2    Renal/GU      Musculoskeletal  (+) Arthritis ,    Abdominal  (+) + obese  Peds  Hematology  (+) Blood dyscrasia, anemia   Anesthesia Other Findings Past Medical History: No date: Allergy No date: Anemia No date: Arthritis     Comment:  back, left elbow, left hip, left ankle  No date: Asthma No date: Blood transfusion without reported diagnosis No date: Complication of anesthesia     Comment:  delayed emergence No date: Diabetes mellitus without complication (HCC)     Comment:  diet controlled No date: Diverticulosis No date: GERD (gastroesophageal reflux disease) No date: Headache No date: History of hiatal hernia No date: History of kidney stones No date: Hyperparathyroidism (HCC) No date: Lymphedema No date: PONV (postoperative nausea and vomiting) No date: S/P subtotal parathyroidectomy No date: Seizure [facility])     Comment:  as a child No date: Sleep apnea     Comment:  cannot tolerate mask 06/20/2018: Thyroid nodule No date: Varicose vein of leg  Past Surgical History: No date: ACETABULUM FRACTURE SURGERY; Left No date: CESAREAN SECTION No date: CHOLECYSTECTOMY 05/31/2021: COLONOSCOPY WITH PROPOFOL;  N/A     Comment:  Procedure: COLONOSCOPY WITH PROPOFOL;  Surgeon:               Regis Bill, MD;  Location: [facility] ENDOSCOPY;                Service: Endoscopy;  Laterality: N/A; 1995: ELBOW FRACTURE SURGERY 05/31/2021: ESOPHAGOGASTRODUODENOSCOPY (EGD) WITH PROPOFOL; N/A     Comment:  Procedure: ESOPHAGOGASTRODUODENOSCOPY (EGD) WITH               PROPOFOL;  Surgeon: Regis Bill, MD;  Location:               [facility] ENDOSCOPY;  Service: Endoscopy;  Laterality: N/A; No date: FRACTURE SURGERY     Comment:  left ankle complete repair, left elbow No date: HERNIA REPAIR     Comment:  incisional 2014: LAPAROSCOPIC GASTRIC SLEEVE RESECTION 10/25/2018: PARATHYROIDECTOMY; N/A     Comment:  Procedure: PARATHYROIDECTOMY, DIABETIC, SLEEP APNEA;                Surgeon: Duanne Guess, MD;  Location: [facility] ORS;                Service: General;  Laterality: N/A; 12/08/2015: TONSILLECTOMY AND ADENOIDECTOMY; N/A     Comment:  Procedure: TONSILLECTOMY AND ADENOIDECTOMY;  Surgeon:               Linus Salmons, MD;  Location: [facility] ORS;  Service: ENT;               Laterality: N/A; No date: TUBAL LIGATION  Reproductive/Obstetrics negative OB ROS                             Anesthesia Physical Anesthesia Plan  ASA: 2  Anesthesia Plan: Spinal   Post-op Pain Management: Regional block* and Ofirmev IV (intra-op)*   Induction: Intravenous  PONV Risk Score and Plan: 2 and Propofol infusion and Dexamethasone  Airway Management Planned: Natural Airway  Additional Equipment:   Intra-op Plan:   Post-operative Plan:   Informed Consent: I have reviewed the patients History and Physical, chart, labs and discussed the procedure including the risks, benefits and alternatives for the proposed anesthesia with the patient or authorized representative who has indicated his/her understanding and acceptance.     Dental Advisory Given  Plan Discussed with: CRNA and  Surgeon  Anesthesia Plan Comments: (Ancef okay)        Anesthesia Quick Evaluation

## 2022-09-22 NOTE — Anesthesia Procedure Notes (Signed)
Spinal  Patient location during procedure: OR Start time: 09/22/2022 7:35 AM End time: 09/22/2022 7:39 AM Reason for block: surgical anesthesia Staffing Performed: resident/CRNA  Anesthesiologist: Foye Deer, MD Resident/CRNA: Berniece Pap, CRNA Performed by: Berniece Pap, CRNA Authorized by: Foye Deer, MD   Preanesthetic Checklist Completed: patient identified, IV checked, site marked, risks and benefits discussed, surgical consent, monitors and equipment checked, pre-op evaluation and timeout performed Spinal Block Patient position: sitting Prep: DuraPrep Patient monitoring: heart rate, cardiac monitor, continuous pulse ox and blood pressure Approach: midline Location: L3-4 Injection technique: single-shot Needle Needle type: Sprotte  Needle gauge: 24 G Needle length: 9 cm Assessment Sensory level: T4 Events: CSF return

## 2022-09-22 NOTE — Anesthesia Postprocedure Evaluation (Signed)
Anesthesia Post Note  Patient: Ashley [name]  Procedure(s) Performed: Left posterior total hip arthroplasty (Left: Hip)  Patient location during evaluation: PACU Anesthesia Type: Spinal Level of consciousness: awake and alert Pain management: pain level controlled Vital Signs Assessment: post-procedure vital signs reviewed and stable Respiratory status: spontaneous breathing, nonlabored ventilation and respiratory function stable Cardiovascular status: blood pressure returned to baseline and stable Postop Assessment: no apparent nausea or vomiting Anesthetic complications: no   No notable events documented.   Last Vitals:  Vitals:   09/22/22 1359 09/22/22 1400  BP: 107/66   Pulse: 73   Resp:  18  Temp:    SpO2: 100%     Last Pain:  Vitals:   09/22/22 1345  TempSrc:   PainSc: 2                  Foye Deer
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
## 2022-09-22 NOTE — Interval H&P Note (Signed)
Patient history and physical updated. Consent reviewed including risks, benefits, and alternatives to surgery. Patient agrees with above plan to proceed with left posterior total hip arthroplasty with possible removal of hardware.

## 2022-09-22 NOTE — Evaluation (Signed)
Physical Therapy Evaluation Patient Details Name: Ashley [name] MRN: [id number] DOB: [birth year] Today's Date: 09/22/2022  History of Present Illness  Ashley [name] was admitted s/p L THA. pmh of asthma, arthritis, GERD, diverticulitis, kidney stones, hyperparathyroidism, and lymphedema.  Clinical Impression  Pt is a pleasant 57 year old female who was admitted s/p L THA. Pt performs bed mobility supervision, transfers min guard, and ambulation with min guard. Patient supine<>sit out of bed. Sit<>stand min guard with RW. Ambulated 100 ft with a stop at the bathroom. Pt independent for hygiene. Returned to chair with adductor wedge, call bell and family in room. Pt demonstrates deficits with balance, mobility, strength, ROM, pain and endurance. Pt will continue to receive skilled PT services while admitted and will defer to TOC/care team for updates regarding disposition planning.      Recommendations for follow up therapy are one component of a multi-disciplinary discharge planning process, led by the attending physician.  Recommendations may be updated based on patient status, additional functional criteria and insurance authorization.  Follow Up Recommendations       Assistance Recommended at Discharge Intermittent Supervision/Assistance  Patient can return home with the following  A little help with walking and/or transfers;A little help with bathing/dressing/bathroom;Assistance with cooking/housework;Help with stairs or ramp for entrance;Assist for transportation    Equipment Recommendations Rolling walker (2 wheels)  Recommendations for Other Services       Functional Status Assessment Patient has had a recent decline in their functional status and demonstrates the ability to make significant improvements in function in a reasonable and predictable amount of time.     Precautions / Restrictions Precautions Precautions: Posterior Hip Precaution Booklet  Issued: No Precaution Comments: no flex >90, no IR, no adduction Restrictions Weight Bearing Restrictions: No      Mobility  Bed Mobility Overal bed mobility: Needs Assistance Bed Mobility: Supine to Sit     Supine to sit: Supervision     General bed mobility comments: Assistance for removing adduction wedge. HOB elevated.    Transfers Overall transfer level: Needs assistance Equipment used: Rolling walker (2 wheels) Transfers: Sit to/from Stand Sit to Stand: Min guard           General transfer comment: patient follows cues for sequencing well. min guard for patient safety with sit<>stand.    Ambulation/Gait Ambulation/Gait assistance: Min guard Gait Distance (Feet): 100 Feet Assistive device: Rolling walker (2 wheels) Gait Pattern/deviations: Step-through pattern, Decreased stride length       General Gait Details: ambulates with appropriate distance with the walker, step through pattern, with decreased stride length. Patient noted that it felt leg her leg length discrepancy was improved post op.  Stairs            Wheelchair Mobility    Modified Rankin (Stroke Patients Only)       Balance Overall balance assessment: Needs assistance Sitting-balance support: Feet supported, Bilateral upper extremity supported Sitting balance-Leahy Scale: Normal     Standing balance support: Bilateral upper extremity supported, During functional activity, Reliant on assistive device for balance Standing balance-Leahy Scale: Good                               Pertinent Vitals/Pain Pain Assessment Pain Assessment: No/denies pain    Home Living Family/patient expects to be discharged to:: Private residence Living Arrangements: Spouse/significant other Available Help at Discharge: Family Type of Home: Mobile home Home Access: Stairs to enter  Entrance Stairs-Rails: Can reach both Entrance Stairs-Number of Steps: 5   Home Layout: One level Home  Equipment: BSC/3in1;Rollator (4 wheels)      Prior Function Prior Level of Function : Independent/Modified Independent             Mobility Comments: IND ADLs Comments: IND     Hand Dominance        Extremity/Trunk Assessment   Upper Extremity Assessment Upper Extremity Assessment: Overall WFL for tasks assessed    Lower Extremity Assessment Lower Extremity Assessment: Overall WFL for tasks assessed       Communication   Communication: No difficulties  Cognition Arousal/Alertness: Awake/alert Behavior During Therapy: WFL for tasks assessed/performed Overall Cognitive Status: Within Functional Limits for tasks assessed                                          General Comments      Exercises Other Exercises Other Exercises: Sit<>stand at toilet min guard, independent for hygeine.   Assessment/Plan    PT Assessment Patient needs continued PT services  PT Problem List Decreased strength;Decreased range of motion;Decreased balance;Decreased mobility;Pain       PT Treatment Interventions DME instruction;Gait training;Stair training;Functional mobility training;Therapeutic activities;Therapeutic exercise;Balance training;Patient/family education    PT Goals (Current goals can be found in the Care Plan section)  Acute Rehab PT Goals Patient Stated Goal: To go home PT Goal Formulation: With patient/family Time For Goal Achievement: 10/06/22 Potential to Achieve Goals: Good    Frequency BID     Co-evaluation               AM-PAC PT "6 Clicks" Mobility  Outcome Measure Help needed turning from your back to your side while in a flat bed without using bedrails?: A Little Help needed moving from lying on your back to sitting on the side of a flat bed without using bedrails?: A Little Help needed moving to and from a bed to a chair (including a wheelchair)?: A Little Help needed standing up from a chair using your arms (e.g., wheelchair  or bedside chair)?: A Little Help needed to walk in hospital room?: A Little Help needed climbing 3-5 steps with a railing? : A Little 6 Click Score: 18    End of Session Equipment Utilized During Treatment: Gait belt Activity Tolerance: Patient tolerated treatment well Patient left: in chair;with call bell/phone within reach Nurse Communication: Mobility status PT Visit Diagnosis: Unsteadiness on feet (R26.81);Other abnormalities of gait and mobility (R26.89);Pain Pain - Right/Left: Left Pain - part of body: Hip    Time: 8657-8469 PT Time Calculation (min) (ACUTE ONLY): 29 min   Charges:              Malachi Carl, SPT   Malachi Carl 09/22/2022, 4:29 PM

## 2022-09-22 NOTE — Op Note (Addendum)
Patient Name: Ashley [name]  ZOX:[id number]  Pre-Operative Diagnosis: Left hip posttraumatic arthritis  Post-Operative Diagnosis: (same)  Procedure: Left Total Hip Arthroplasty  Components/Implants: Cup Trident Tritanium multi hole 78mm/D cup with x2 screws    Liner: Neutral X3 Poly 36/D  Stem Insignia  #3 high offset  Head Biolox Ceramic 36mm +0  Date of Surgery: 09/22/2022  Surgeon: Reinaldo Berber MD  Assistant: Amador Cunas pA (present and scrubbed throughout the case, critical for assistance with exposure, retraction, instrumentation, and closure)   Anesthesiologist: Laural Benes  Anesthesia: Spinal   IVF:1300cc  EBL: 300cc  Complications: None   Brief history: The patient is a 57 year old female with a history of posttraumatic left hip arthritis after a hip dislocation in 1995 which was reduced and had a posterior wall plate applied to an acetabular fracture on the left hip.  She presented with pain limiting her range of motion and her activities of daily living which had failed multiple attempts at conservative therapy.  The risks and benefits of total hip arthroplasty as definitive surgical treatment were discussed with the patient, who opted to proceed with the operation.  After outpatient medical clearance and optimization was completed the patient was admitted to [facility] for the procedure.  All preoperative films were reviewed and an appropriate surgical plan was made prior to surgery.   Description of procedure: The patient was brought to the operating room where laterality was confirmed by all those present to be the left side.  The patient was moved to the table and administered spinal anesthesia. Patient was given an intravenous dose of antibiotics for surgical prophylaxis and TXA. The patient was positioned in lateral decubitus position with all bony prominences well-padded.  Surgical site was prepped with alcohol and chlorhexidine.   Surgical site over the hip was draped in typical sterile fashion with multiple layers of adhesive and nonadhesive drapes.  The incision site over the greater trochanter posteriorly was marked out with a sterile marker in line with the previous incision.   Surgical timeout was then called with participation of all staff in the room the patient was confirmed and laterality again confirmed.  An incision was made over the lateral aspect of the hip cheating posteriorly on the proximal aspect following the prior incision.  Careful soft tissue dissection and coagulation of all bleeders was carried out down to the level of the glut max fascia.  The fascia was carefully incised in line with the femur.  A Charnley retractor was placed deep to the fascia with care taken to ensure that there was no nerve entrapment in the retractor.  The bursa and previous scar tissue was taken down over the posterior femur exposing a large wall of posterior capsular scar with no identifiable discrete external rotators or piriformis.  A dull Cobra retractor was placed under the abductor mechanism to protect the mechanism and fully expose the scarred capsule.  The capsule was carefully incised in a T shaped fashion with retraction stitch placed.  At this time we encountered the posterior wall plate however any further dissection posteriorly with electrocautery was found to be too close to the sciatic nerve and further posterior dissection was avoided.  The femur was then carefully dislocated and cobra retractors were placed superior and inferior to the femoral neck.  The x-ray template was reviewed and electrocautery was used to mark out the femoral neck resection.   After the femoral neck and head were resected attention was then turned to the acetabulum.  An anterior acetabular retractor was placed and posterior retractor was placed to fully visualize the acetabulum.  The labrum and scar tissue was removed with electrocautery and the  pulnivar was removed.  The acetabulum was then reamed sequentially up to a size 48 cup which allowed for good bony coverage and stability.  The size 48 acetabular component was then opened and implanted.  A drill was used to carefully place the 2 screws into the acetabular component.  A trial acetabular liner was then placed.  Attention was turned back to the femur and a femoral neck retractor was placed.  The femur was opened with a box osteotome and canal finder.  The femur was then sequentially broached up to a size 3 broach which allowed for good fit and fill.  A calcar planer was then used to smooth out the calcar.  A trial neck and head were then attached and the hip was reduced.  The hip was found to be stable on reduction with full range of motion without subluxation or dislocation and leg lengths felt equal.  An intraoperative x-ray was then performed which showed the cup and femoral stem to be in appropriate appearing positions with no evidence of any periprosthetic fractures or other issues.  The hip was then carefully dislocated head and neck trial was removed.  The acetabulum was then exposed the trial liner removed and a real acetabular liner was implanted and checked for stability.    The femur was then exposed the broach was removed the canal was irrigated and a real size 3 femoral implant was impacted into place.  The real ball was then placed on the femoral component.  The acetabulum was irrigated and the hip was reduced.  The hip showed good range of motion and stability on testing with both stability in flexion internal rotation and extension external rotation.  The hip was then irrigated with betadine based surgiphor solution and pulsatile lavage with saline.  A 2 mm drill bit was then used to make 2 holes in the greater trochanter the capsular tissue and scar tissue were reapproximated and passed through the drill holes and tied over the greater trochanter.  The fascia was then approximated  with #1 Vicryl and #2 barbed suture.  The subcutaneous tissues and skin were closed with 0 Vicryl 2-0 Vicryl and 3-0 V lock suture and the skin closed with Dermabond.  A sterile dressing was then applied.  Lap, sharps, and sponge counts were correct at the end of the case.   The patient was then rolled supine and an x-ray was taken in the operating room. Leg lengths were clinically equal on examination with a good distal pulse. Components appeared in good position with no fractures noted on x-ray.  Patient was then transferred to a hospital bed and transferred to the recovery room in stable condition.

## 2022-09-22 NOTE — Transfer of Care (Signed)
Immediate Anesthesia Transfer of Care Note  Patient: Ashley [name]  Procedure(s) Performed: Left posterior total hip arthroplasty (Left: Hip)  Patient Location: PACU  Anesthesia Type:Spinal  Level of Consciousness: awake, alert , and oriented  Airway & Oxygen Therapy: Patient Spontanous Breathing and Patient connected to nasal cannula oxygen  Post-op Assessment: Report given to RN and Post -op Vital signs reviewed and stable  Post vital signs: Reviewed and stable  Last Vitals:  Vitals Value Taken Time  BP 96/58 09/22/22 1015  Temp    Pulse 73 09/22/22 1016  Resp 17 09/22/22 1016  SpO2 100 % 09/22/22 1016  Vitals shown include unvalidated device data.  Last Pain:  Vitals:   09/22/22 0644  TempSrc: Temporal  PainSc: 2          Complications: No notable events documented.

## 2022-09-23 ENCOUNTER — Other Ambulatory Visit: Payer: Self-pay

## 2022-09-23 ENCOUNTER — Encounter: Payer: Self-pay | Admitting: Orthopedic Surgery

## 2022-09-23 DIAGNOSIS — M1652 Unilateral post-traumatic osteoarthritis, left hip: Secondary | ICD-10-CM | POA: Diagnosis not present

## 2022-09-23 DIAGNOSIS — M1612 Unilateral primary osteoarthritis, left hip: Secondary | ICD-10-CM | POA: Diagnosis not present

## 2022-09-23 DIAGNOSIS — E119 Type 2 diabetes mellitus without complications: Secondary | ICD-10-CM | POA: Diagnosis not present

## 2022-09-23 DIAGNOSIS — J45909 Unspecified asthma, uncomplicated: Secondary | ICD-10-CM | POA: Diagnosis not present

## 2022-09-23 DIAGNOSIS — Z79899 Other long term (current) drug therapy: Secondary | ICD-10-CM | POA: Diagnosis not present

## 2022-09-23 LAB — BASIC METABOLIC PANEL WITH GFR
Anion gap: 9 (ref 5–15)
BUN: 12 mg/dL (ref 6–20)
CO2: 25 mmol/L (ref 22–32)
Calcium: 9 mg/dL (ref 8.9–10.3)
Chloride: 104 mmol/L (ref 98–111)
Creatinine, Ser: 0.84 mg/dL (ref 0.44–1.00)
GFR, Estimated: >60 mL/min
Glucose, Bld: 125 mg/dL — ABNORMAL HIGH (ref 70–99)
Potassium: 4 mmol/L (ref 3.5–5.1)
Sodium: 138 mmol/L (ref 135–145)

## 2022-09-23 LAB — CBC
HCT: 27.7 % — ABNORMAL LOW (ref 36.0–46.0)
Hemoglobin: 9 g/dL — ABNORMAL LOW (ref 12.0–15.0)
MCH: 28.4 pg (ref 26.0–34.0)
MCHC: 32.5 g/dL (ref 30.0–36.0)
MCV: 87.4 fL (ref 80.0–100.0)
Platelets: 216 K/uL (ref 150–400)
RBC: 3.17 MIL/uL — ABNORMAL LOW (ref 3.87–5.11)
RDW: 13.1 % (ref 11.5–15.5)
WBC: 15.2 K/uL — ABNORMAL HIGH (ref 4.0–10.5)
nRBC: 0 % (ref 0.0–0.2)

## 2022-09-23 MED ORDER — DOCUSATE SODIUM 100 MG PO CAPS
100.0000 mg | ORAL_CAPSULE | Freq: Two times a day (BID) | ORAL | 0 refills | Status: AC
  Filled 2022-09-23: qty 10, 5d supply, fill #0

## 2022-09-23 MED ORDER — ACETAMINOPHEN 500 MG PO TABS
1000.0000 mg | ORAL_TABLET | Freq: Three times a day (TID) | ORAL | 0 refills | Status: AC
  Filled 2022-09-23: qty 30, 5d supply, fill #0

## 2022-09-23 MED ORDER — PANTOPRAZOLE SODIUM 40 MG PO TBEC
DELAYED_RELEASE_TABLET | ORAL | Status: AC
  Filled 2022-09-23: qty 1

## 2022-09-23 MED ORDER — ENOXAPARIN SODIUM 40 MG/0.4ML IJ SOSY
40.0000 mg | PREFILLED_SYRINGE | INTRAMUSCULAR | 0 refills | Status: DC
  Filled 2022-09-23: qty 5.6, 14d supply, fill #0

## 2022-09-23 MED ORDER — DOCUSATE SODIUM 100 MG PO CAPS
ORAL_CAPSULE | ORAL | Status: AC
  Filled 2022-09-23: qty 1

## 2022-09-23 MED ORDER — HYDROCODONE-ACETAMINOPHEN 5-325 MG PO TABS
ORAL_TABLET | ORAL | Status: AC
  Filled 2022-09-23: qty 2

## 2022-09-23 MED ORDER — MORPHINE SULFATE (PF) 4 MG/ML IV SOLN
INTRAVENOUS | Status: AC
  Filled 2022-09-23: qty 1

## 2022-09-23 MED ORDER — KETOROLAC TROMETHAMINE 15 MG/ML IJ SOLN
INTRAMUSCULAR | Status: AC
  Filled 2022-09-23: qty 1

## 2022-09-23 MED ORDER — FERROUS SULFATE 325 (65 FE) MG PO TABS
ORAL_TABLET | ORAL | Status: AC
  Filled 2022-09-23: qty 1

## 2022-09-23 MED ORDER — CELECOXIB 200 MG PO CAPS
200.0000 mg | ORAL_CAPSULE | Freq: Two times a day (BID) | ORAL | 0 refills | Status: AC
  Filled 2022-09-23: qty 28, 14d supply, fill #0

## 2022-09-23 MED ORDER — TRAMADOL HCL 50 MG PO TABS
50.0000 mg | ORAL_TABLET | Freq: Four times a day (QID) | ORAL | 0 refills | Status: AC | PRN
  Filled 2022-09-23: qty 30, 8d supply, fill #0

## 2022-09-23 MED ORDER — ENOXAPARIN SODIUM 40 MG/0.4ML IJ SOSY
PREFILLED_SYRINGE | INTRAMUSCULAR | Status: AC
  Filled 2022-09-23: qty 0.4

## 2022-09-23 MED ORDER — OXYCODONE HCL 5 MG PO TABS
2.5000 mg | ORAL_TABLET | Freq: Three times a day (TID) | ORAL | 0 refills | Status: AC | PRN
  Filled 2022-09-23: qty 10, 7d supply, fill #0

## 2022-09-23 MED ORDER — METOCLOPRAMIDE HCL 5 MG PO TABS
5.0000 mg | ORAL_TABLET | Freq: Three times a day (TID) | ORAL | 0 refills | Status: AC | PRN
  Filled 2022-09-23: qty 20, 4d supply, fill #0

## 2022-09-23 MED ORDER — ENOXAPARIN SODIUM 40 MG/0.4ML IJ SOSY
40.0000 mg | PREFILLED_SYRINGE | INTRAMUSCULAR | 0 refills | Status: AC
  Filled 2022-09-23: qty 4, 10d supply, fill #0
  Filled 2022-09-23: qty 1.6, 4d supply, fill #0
  Filled 2022-09-23: qty 4, 4d supply, fill #0
  Filled 2022-09-23: qty 1.6, 10d supply, fill #0

## 2022-09-23 NOTE — Progress Notes (Signed)
Patient is not able to walk the distance required to go the bathroom, or she is unable to safely negotiate stairs required to access the bathroom.  A 3in1 BSC will alleviate this problem.       T. Chris Hara [place], PA-C [facility] 

## 2022-09-23 NOTE — TOC Initial Note (Addendum)
Transition of Care [facility]) - Initial/Assessment Note    Patient Details  Name: Ashley [name] MRN: [id number] Date of Birth: Jan 19, 1966  Transition of [facility]) CM/SW Contact:    Liliana Cline, LCSW Phone Number: 09/23/2022, 8:33 AM  Clinical Narrative:                 CSW spoke with patient. Patient to DC from post op today.  Patient states she already has a RW at home. Patient was prearranged with [facility] by [employer], patient is agreeable. Barbara Cower with [facility] notified of DC today. Per Barbara Cower with [facility], they will see patient on 7/1 and Dr. Audelia Acton is aware.   Expected Discharge Plan: Home w Home Health Services Barriers to Discharge: Barriers Resolved   Patient Goals and CMS Choice            Expected Discharge Plan and Services         Expected Discharge Date: 09/23/22                         HH Arranged: PT HH Agency: [facility] Date HH Agency Contacted: 09/23/22   Representative spoke with at [facility] Agency: Barbara Cower  Prior Living Arrangements/Services     Patient language and need for interpreter reviewed:: Yes Do you feel safe going back to the place where you live?: Yes      Need for Family Participation in Patient Care: Yes (Comment) Care giver support system in place?: Yes (comment) Current home services: DME Criminal Activity/Legal Involvement Pertinent to Current Situation/Hospitalization: No - Comment as needed  Activities of Daily Living Home Assistive Devices/Equipment: Eyeglasses, Dentures (specify type) ADL Screening (condition at time of admission) Patient's cognitive ability adequate to safely complete daily activities?: Yes Is the patient deaf or have difficulty hearing?: No Does the patient have difficulty seeing, even when wearing glasses/contacts?: No Does the patient have difficulty concentrating, remembering, or making decisions?: No Patient able to express need for assistance with ADLs?:  Yes Does the patient have difficulty dressing or bathing?: No Independently performs ADLs?: Yes (appropriate for developmental age) Does the patient have difficulty walking or climbing stairs?: Yes Weakness of Legs: Left Weakness of Arms/Hands: None  Permission Sought/Granted Permission sought to share information with : Facility Industrial/product designer granted to share information with : Yes, Verbal Permission Granted              Emotional Assessment       Orientation: : Oriented to Self, Oriented to Situation, Oriented to Place, Oriented to  Time Alcohol / Substance Use: Not Applicable Psych Involvement: No (comment)  Admission diagnosis:  Osteoarthritis of left hip [M16.12] Patient Active Problem List   Diagnosis Date Noted   Osteoarthritis of left hip 09/22/2022   Side pain 08/16/2022   Musculoskeletal pain 08/16/2022   Varicose veins of both legs with edema 01/05/2021   Swelling of limb 12/30/2020   Diabetes (HCC) 12/30/2020   Lymphedema 12/30/2020   S/P subtotal parathyroidectomy 11/08/2018   Hyperparathyroidism (HCC) 06/20/2018   Thyroid nodule 06/20/2018   Intractable pain 04/12/2015   PCP:  Lynnea Ferrier, MD Pharmacy:   [facility] [address] [place] [address] Phone: [phone] Fax: [phone]     Social Determinants of Health (SDOH) Social History: SDOH Screenings   Food Insecurity: No Food Insecurity (09/22/2022)  Housing: Low Risk  (09/22/2022)  Transportation Needs: No Transportation Needs (  09/22/2022)  Utilities: Not At Risk (09/22/2022)  Tobacco Use: Low Risk  (09/22/2022)   SDOH Interventions:     Readmission Risk Interventions     No data to display

## 2022-09-23 NOTE — Discharge Summary (Addendum)
Physician Discharge Summary  Patient ID: FINOLA STATZER MRN: [id number] DOB/AGE: 1965/03/31 57 y.o.  Admit date: 09/22/2022 Discharge date: 09/23/2022  Admission Diagnoses:  Osteoarthritis of left hip [M16.12]   Discharge Diagnoses: Patient Active Problem List   Diagnosis Date Noted   Osteoarthritis of left hip 09/22/2022   Side pain 08/16/2022   Musculoskeletal pain 08/16/2022   Varicose veins of both legs with edema 01/05/2021   Swelling of limb 12/30/2020   Diabetes (HCC) 12/30/2020   Lymphedema 12/30/2020   S/P subtotal parathyroidectomy 11/08/2018   Hyperparathyroidism (HCC) 06/20/2018   Thyroid nodule 06/20/2018   Intractable pain 04/12/2015    Past Medical History:  Diagnosis Date   Allergy    Anemia    Arthritis    back, left elbow, left hip, left ankle    Asthma    Blood transfusion without reported diagnosis    Complication of anesthesia    delayed emergence   Diabetes mellitus without complication (HCC)    diet controlled   Diverticulosis    GERD (gastroesophageal reflux disease)    Headache    History of hiatal hernia    History of kidney stones    Hyperparathyroidism (HCC)    Lymphedema    PONV (postoperative nausea and vomiting)    S/P subtotal parathyroidectomy    Seizure (HCC)    as a child   Sleep apnea    cannot tolerate mask   Thyroid nodule 06/20/2018   Varicose vein of leg      Transfusion: none   Consultants (if any):   Discharged Condition: Improved  Hospital Course: Taya Engelhardt Calloway-Nibblett is an 57 y.o. female who was admitted 09/22/2022 with a diagnosis of Osteoarthritis of left hip and went to the operating room on 09/22/2022 and underwent the above named procedures.    Surgeries: Procedure(s): Left posterior total hip arthroplasty on 09/22/2022 Patient tolerated the surgery well. Taken to PACU where she was stabilized and then transferred to the orthopedic floor.  Started on Lovenox 40 mg q 12 hrs. TEDs and SCDs  applied bilaterally. Heels elevated on bed. No evidence of DVT. Negative Homan. Physical therapy started on day #1 for gait training and transfer. OT started day #1 for ADL and assisted devices.  Patient's IV  was d/c on day #1. Patient was able to safely and independently complete all PT goals. PT recommending discharge to home.    On post op day #1 patient was stable and ready for discharge to home with HHPT.  Implants: Cup Trident Tritanium multi hole 76mm/D cup with x2 screws    Liner: Neutral X3 Poly 36/D  Stem Insignia  #3 high offset  Head Biolox Ceramic 36mm +0  She was given perioperative antibiotics:  Anti-infectives (From admission, onward)    Start     Dose/Rate Route Frequency Ordered Stop   09/22/22 1400  ceFAZolin (ANCEF) IVPB 2g/100 mL premix        2 g 200 mL/hr over 30 Minutes Intravenous Every 6 hours 09/22/22 1132 09/22/22 2025   09/22/22 0600  ceFAZolin (ANCEF) IVPB 2g/100 mL premix        2 g 200 mL/hr over 30 Minutes Intravenous On call to O.R. 09/22/22 0123 09/22/22 0750     .  She was given sequential compression devices, early ambulation, and Lovenox TEDs for DVT prophylaxis.  She benefited maximally from the hospital stay and there were no complications.    Recent vital signs:  Vitals:   09/22/22 1933 09/23/22 0339  BP: 98/66 (!) 85/66  Pulse: 97 85  Resp: 18 18  Temp: 97.9 F (36.6 C) (!) 97.5 F (36.4 C)  SpO2: 100% 97%    Recent laboratory studies:  Lab Results  Component Value Date   HGB 9.0 (L) 09/23/2022   HGB 12.7 08/10/2018   HGB 12.3 07/31/2016   Lab Results  Component Value Date   WBC 15.2 (H) 09/23/2022   PLT 216 09/23/2022   Lab Results  Component Value Date   INR 1.0 08/09/2012   Lab Results  Component Value Date   NA 138 09/23/2022   K 4.0 09/23/2022   CL 104 09/23/2022   CO2 25 09/23/2022   BUN 12 09/23/2022   CREATININE 0.84 09/23/2022   GLUCOSE 125 (H) 09/23/2022    Discharge Medications:   Allergies as of  09/23/2022       Reactions   Bee Venom Anaphylaxis   Influenza Vac Split [influenza Virus Vaccine] Anaphylaxis   Phenergan [promethazine]    Pt states does not work after surgery-Needs Reglan   Zofran [ondansetron]    "Does not work after surgery" Pt states she needs Reglan   Dilaudid [hydromorphone] Itching   Metformin Diarrhea   Orange Fruit [citrus] Itching   Other Rash   Iron infusions-has to be premedicated with Benadryl   Penicillins Nausea And Vomiting, Rash, Other (See Comments)   Has patient had a PCN reaction causing immediate rash, facial/tongue/throat swelling, SOB or lightheadedness with hypotension: Yes Has patient had a PCN reaction causing severe rash involving mucus membranes or skin necrosis: No Has patient had a PCN reaction that required hospitalization No Has patient had a PCN reaction occurring within the last 10 years: No If all of the above answers are "NO", then may proceed with Cephalosporin use.        Medication List     TAKE these medications    acetaminophen 500 MG tablet Commonly known as: TYLENOL Take 2 tablets (1,000 mg total) by mouth every 8 (eight) hours.   albuterol 108 (90 Base) MCG/ACT inhaler Commonly known as: VENTOLIN HFA Inhale 2 puffs into the lungs every 6 (six) hours as needed for wheezing or shortness of breath.   Benefiber Chew Chew 2 tablets by mouth daily.   Biotin 2500 MCG Caps Take 5,000 mcg by mouth daily.   busPIRone 7.5 MG tablet Commonly known as: BUSPAR Take 1 tablet (7.5 mg total) by mouth 2 (two) times daily as needed. What changed: when to take this   celecoxib 200 MG capsule Commonly known as: CeleBREX Take 1 capsule (200 mg total) by mouth 2 (two) times daily for 14 days.   Cholecalciferol 125 MCG (5000 UT) capsule Take 5,000 Units by mouth once a week. mondays   docusate sodium 100 MG capsule Commonly known as: COLACE Take 1 capsule (100 mg total) by mouth 2 (two) times daily.   enoxaparin 40  MG/0.4ML injection Commonly known as: LOVENOX Inject 0.4 mLs (40 mg total) into the skin daily for 14 days.   EPINEPHrine 0.3 mg/0.3 mL Soaj injection Commonly known as: EPI-PEN Inject 0.3 mg into the muscle as needed (for anaphylaxis).   escitalopram 10 MG tablet Commonly known as: LEXAPRO Take 10 mg by mouth every morning.   ferrous sulfate 325 (65 FE) MG tablet Take 325 mg by mouth daily with breakfast.   fluticasone 50 MCG/ACT nasal spray Commonly known as: FLONASE Place 1 spray into both nostrils every morning.   Lifitegrast 5 % Soln Place  1 drop into both eyes at bedtime.   meclizine 25 MG tablet Commonly known as: ANTIVERT Take 25 mg by mouth 3 (three) times daily as needed for dizziness.   metoCLOPramide 5 MG tablet Commonly known as: REGLAN Take 1-2 tablets (5-10 mg total) by mouth every 8 (eight) hours as needed for nausea (if ondansetron (ZOFRAN) ineffective.).   Mounjaro 15 MG/0.5ML Pen Generic drug: tirzepatide Inject 15 mg subcutaneously every 7 (seven) days What changed:  how much to take when to take this additional instructions   multivitamin with minerals tablet Take 1 tablet by mouth daily.   omeprazole 40 MG capsule Commonly known as: PRILOSEC Take 1 to 2 capsules by mouth once daily What changed:  how much to take when to take this   oxyCODONE 5 MG immediate release tablet Commonly known as: Roxicodone Take 0.5 tablets (2.5 mg total) by mouth every 8 (eight) hours as needed for breakthrough pain.   pregabalin 25 MG capsule Commonly known as: LYRICA Take 1 capsule (25 mg total) by mouth 2 (two) times daily. What changed: when to take this   prochlorperazine 10 MG tablet Commonly known as: COMPAZINE Take 10 mg by mouth every 6 (six) hours as needed for nausea or vomiting.   SOOTHE XP OP Place 1 drop into both eyes daily.   Symbicort 80-4.5 MCG/ACT inhaler Generic drug: budesonide-formoterol Inhale 2 puffs by mouth twice a  day (Inhale into the lungs.) What changed:  how much to take when to take this reasons to take this   topiramate 50 MG tablet Commonly known as: TOPAMAX Take 50 mg by mouth as needed.   traMADol 50 MG tablet Commonly known as: ULTRAM Take 1 tablet (50 mg total) by mouth every 6 (six) hours as needed for moderate pain.   vitamin E 180 MG (400 UNITS) capsule Take 400 Units by mouth daily.               Durable Medical Equipment  (From admission, onward)           Start     Ordered   09/22/22 1650  For home use only DME Walker rolling  Once       Question Answer Comment  Walker: With 5 Inch Wheels   Patient needs a walker to treat with the following condition Weakness generalized      09/22/22 1649            Diagnostic Studies: DG Pelvis Portable  Result Date: 09/22/2022 CLINICAL DATA:  Intraoperative PA pelvis.  Elective surgery. EXAM: PORTABLE PELVIS 1-2 VIEWS COMPARISON:  CT abdomen and pelvis 08/29/2022 FINDINGS: Two frontal views of the pelvis centered on the left hip. Redemonstration of prior posterior left acetabular malleable plate and screw fixation. The patient is undergoing new total left hip arthroplasty. No hardware complication is seen. Lucency from lateral proximal thigh surgical approach soft tissue defect. A single surgical clip overlies the left hemipelvis. IMPRESSION: Intraoperative radiograph during total left hip arthroplasty without complicating feature. Electronically Signed   By: Neita Garnet M.D.   On: 09/22/2022 11:54   DG Pelvis Portable  Result Date: 09/22/2022 CLINICAL DATA:  Elective surgery. EXAM: PORTABLE PELVIS 1-2 VIEWS COMPARISON:  CT left hip 04/13/2022, CT abdomen pelvis 08/29/2022 FINDINGS: Redemonstration of posterior left acetabular level plate fixation, remote. New total left hip arthroplasty. No perihardware lucency seen to indicate hardware failure or loosening. Expected postoperative left hip subcutaneous air. Mild  bilateral sacroiliac subchondral sclerosis. No acute fracture or  dislocation. IMPRESSION: Interval total left hip arthroplasty without evidence of hardware failure. Electronically Signed   By: Neita Garnet M.D.   On: 09/22/2022 11:18   CT ABDOMEN PELVIS W CONTRAST  Result Date: 09/02/2022 CLINICAL DATA:  Left upper quadrant and periumbilical pain, history of gastric sleeve surgery and cholecystectomy. EXAM: CT ABDOMEN AND PELVIS WITH CONTRAST TECHNIQUE: Multidetector CT imaging of the abdomen and pelvis was performed using the standard protocol following bolus administration of intravenous contrast. RADIATION DOSE REDUCTION: This exam was performed according to the departmental dose-optimization program which includes automated exposure control, adjustment of the mA and/or kV according to patient size and/or use of iterative reconstruction technique. CONTRAST:  OMNIPAQUE IOHEXOL 300 MG/ML  SOLN COMPARISON:  08/10/2018 FINDINGS: Lower chest: Minor dependent bibasilar atelectasis. Normal heart size. No pericardial or pleural effusion. Similar small to moderate hiatal hernia. Hepatobiliary: Slight decreased attenuation of the liver suggesting minor hepatic steatosis. No large focal hepatic abnormality or biliary obstruction pattern. Remote cholecystectomy noted. Common bile duct nondilated. Pancreas: Unremarkable. No pancreatic ductal dilatation or surrounding inflammatory changes. Spleen: Normal in size without focal abnormality. Adrenals/Urinary Tract: Adrenal glands are unremarkable. Kidneys are normal, without renal calculi, focal lesion, or hydronephrosis. Bladder is unremarkable. Stomach/Bowel: Remote gastric sleeve surgery noted. Negative for bowel obstruction, significant dilatation, ileus, or free air. Scattered colonic diverticulosis without acute inflammatory process. Normal appendix demonstrated. No free fluid, fluid collection, hemorrhage, hematoma, abscess or ascites. Vascular/Lymphatic: No  significant vascular findings are present. No enlarged abdominal or pelvic lymph nodes. Reproductive: Uterus and bilateral adnexa are unremarkable. Other: Intact abdominal wall with midline diastasis noted. No abdominal wall or ventral hernia. No inguinal abnormality. No abdominopelvic ascites. Musculoskeletal: Postop changes of remote left pelvic acetabular fixation. Degenerative changes noted of the left hip and lumbosacral spine. Lower lumbar multilevel facet arthropathy present. No acute osseous finding. Stable appearance of the lumbar spine with remote compression deformities at L1 and L3. Stable trace anterolisthesis of L4 on L5. IMPRESSION: 1. No acute intra-abdominal or pelvic finding by CT. 2. Remote gastric sleeve surgery and cholecystectomy. 3. Minor hepatic steatosis. 4. Colonic diverticulosis without acute inflammatory process. Electronically Signed   By: Judie Petit.  Shick M.D.   On: 09/02/2022 08:21    Disposition: Discharge disposition: 06-Home-Health Care Svc          Follow-up Information     Evon Slack, PA-C Follow up.   Specialties: Orthopedic Surgery, Emergency Medicine Contact information: [address] [place] [address] [phone]                  Signed: Patience Musca 09/23/2022, 8:20 AM

## 2022-09-23 NOTE — Progress Notes (Addendum)
Subjective: 1 Day Post-Op Procedure(s) (LRB): Left posterior total hip arthroplasty (Left) Patient reports pain as mild.   Patient is well, and has had no acute complaints or problems Denies any CP, SOB, ABD pain. We will continue therapy today.  Patient has been ambulatory in the room with no complications.  Pain well-controlled with ambulation. Plan is to go Home after hospital stay.  Objective: Vital signs in last 24 hours: Temp:  [97.1 F (36.2 C)-98.2 F (36.8 C)] 97.5 F (36.4 C) (06/28 0339) Pulse Rate:  [63-97] 85 (06/28 0339) Resp:  [9-24] 18 (06/28 0339) BP: (77-107)/(51-67) 85/66 (06/28 0339) SpO2:  [97 %-100 %] 97 % (06/28 0339) Weight:  [93.1 kg] 93.1 kg (06/27 1219)  Intake/Output from previous day: 06/27 0701 - 06/28 0700 In: 2003.3 [I.V.:1603.3; IV Piggyback:400] Out: 600 [Urine:300; Blood:300] Intake/Output this shift: No intake/output data recorded.  Recent Labs    09/23/22 0537  HGB 9.0*   Recent Labs    09/23/22 0537  WBC 15.2*  RBC 3.17*  HCT 27.7*  PLT 216   Recent Labs    09/23/22 0537  NA 138  K 4.0  CL 104  CO2 25  BUN 12  CREATININE 0.84  GLUCOSE 125*  CALCIUM 9.0   No results for input(s): "LABPT", "INR" in the last 72 hours.  EXAM General - Patient is Alert, Appropriate, and Oriented Extremity - Neurovascular intact Sensation intact distally Intact pulses distally Dorsiflexion/Plantar flexion intact Dressing - dressing C/D/I and no drainage Motor Function - intact, moving foot and toes well on exam.   Past Medical History:  Diagnosis Date   Allergy    Anemia    Arthritis    back, left elbow, left hip, left ankle    Asthma    Blood transfusion without reported diagnosis    Complication of anesthesia    delayed emergence   Diabetes mellitus without complication (HCC)    diet controlled   Diverticulosis    GERD (gastroesophageal reflux disease)    Headache    History of hiatal hernia    History of kidney  stones    Hyperparathyroidism (HCC)    Lymphedema    PONV (postoperative nausea and vomiting)    S/P subtotal parathyroidectomy    Seizure (HCC)    as a child   Sleep apnea    cannot tolerate mask   Thyroid nodule 06/20/2018   Varicose vein of leg     Assessment/Plan:   1 Day Post-Op Procedure(s) (LRB): Left posterior total hip arthroplasty (Left) Principal Problem:   Osteoarthritis of left hip  Estimated body mass index is 34.16 kg/m as calculated from the following:   Height as of this encounter: 5\' 5"  (1.651 m).   Weight as of this encounter: 93.1 kg. Advance diet Up with therapy Patient doing well this morning.  Pain well-controlled.  Ambulatory in the room, to the restroom with no complications.  Vital signs stable, BP soft.  Blood pressure close to baseline.  Continue with gentle IV fluid hydration this morning.  No history of hypertension.  Patient asymptomatic with standing, ambulating in the room.  Labs are stable.  Hemoglobin 9.0.  Continue with home iron supplement.  History of iron deficiency anemia  Care management to assist with discharge to home with home health PT today pending safe completion of PT goals    DVT Prophylaxis - Lovenox, TED hose, and SCDs Weight-Bearing as tolerated to left leg   T. Cranston Neighbor, PA-C [facility]  09/23/2022, 7:59 AM   Patient seen and examined, agree with above plan.  The patient is doing well status post left posterior total hip arthroplasty, no concerns at this time.  Pain is controlled.  Discussed DVT prophylaxis, pain medication use, and safe transition to home.  All questions answered the patient agrees with above plan will go home after clears PT.   Reinaldo Berber MD

## 2022-09-23 NOTE — Discharge Instructions (Addendum)
Instructions after Posterior Total Hip Replacement        Dr. Zachary Aberman, Jr., M.D.      Dept. of Orthopaedics & Sports Medicine  [facility]  [address]  [place], [place]  [address]  Phone: [phone]   Fax: [phone]    DIET: Drink plenty of non-alcoholic fluids. Resume your normal diet. Include foods high in fiber.  ACTIVITY:  You may use crutches or a walker with weight-bearing as tolerated, unless instructed otherwise. You may be weaned off of the walker or crutches by your Physical Therapist.  Do NOT reach below the level of your knees or cross your legs until allowed.    Continue doing gentle exercises. Exercising will reduce the pain and swelling, increase motion, and prevent muscle weakness.   Please continue to use the TED compression stockings for 2 weeks. You may remove the stockings at night, but should reapply them in the morning. Do not drive or operate any equipment until instructed.  WOUND CARE:  Continue to use ice packs periodically to reduce pain and swelling. You may shower with honeycomb dressing 3 days after surgery. Do not submerge incision site under water. Remove honeycomb dressing 7 days after surgery and allow dermabond to fall off on its own.   MEDICATIONS: You may resume your regular medications. Please take the pain medication as prescribed on the medication list. Do not take pain medication on an empty stomach. You have been given a prescription for a blood thinner to prevent blood clots. Please take the medication as instructed. (NOTE: After completing a 2 week course of Lovenox, take one Enteric-coated 81 mg aspirin twice a day for 3 additional weeks.) Pain medications and iron supplements can cause constipation. Use a stool softener (Senokot or Colace) on a daily basis and a laxative (dulcolax or miralax) as needed. Do not drive or drink alcoholic beverages when taking pain medications.   POSTOPERATIVE CONSTIPATION  PROTOCOL Constipation - defined medically as fewer than three stools per week and severe constipation as less than one stool per week.  One of the most common issues patients have following surgery is constipation.  Even if you have a regular bowel pattern at home, your normal regimen is likely to be disrupted due to multiple reasons following surgery.  Combination of anesthesia, postoperative narcotics, change in appetite and fluid intake all can affect your bowels.  In order to avoid complications following surgery, here are some recommendations in order to help you during your recovery period.  Colace (docusate) - Pick up an over-the-counter form of Colace or another stool softener and take twice a day as long as you are requiring postoperative pain medications.  Take with a full glass of water daily.  If you experience loose stools or diarrhea, hold the colace until you stool forms back up.  If your symptoms do not get better within 1 week or if they get worse, check with your doctor.  Dulcolax (bisacodyl) - Pick up over-the-counter and take as directed by the product packaging as needed to assist with the movement of your bowels.  Take with a full glass of water.  Use this product as needed if not relieved by Colace only.   MiraLax (polyethylene glycol) - Pick up over-the-counter to have on hand.  MiraLax is a solution that will increase the amount of water in your bowels to assist with bowel movements.  Take as directed and can mix with a glass of water, juice, soda, coffee, or tea.    Take if you go more than two days without a movement. Do not use MiraLax more than once per day. Call your doctor if you are still constipated or irregular after using this medication for 7 days in a row.  If you continue to have problems with postoperative constipation, please contact the office for further assistance and recommendations.  If you experience "the worst abdominal pain ever" or develop nausea or  vomiting, please contact the office immediatly for further recommendations for treatment.   CALL THE OFFICE FOR: Temperature above 101 degrees Excessive bleeding or drainage on the dressing. Excessive swelling, coldness, or paleness of the toes. Persistent nausea and vomiting.  FOLLOW-UP:  You should have an appointment to return to the office in 2 weeks after surgery. Arrangements have been made for continuation of Physical Therapy (either home therapy or outpatient therapy).  

## 2022-09-23 NOTE — Progress Notes (Signed)
Physical Therapy Treatment Patient Details Name: Ashley [name] MRN: [id number] DOB: 08-20-1965 Today's Date: 09/23/2022   History of Present Illness Ashley [name] was admitted s/p L THA. pmh of asthma, arthritis, GERD, diverticulitis, kidney stones, hyperparathyroidism, and lymphedema.    PT Comments    Patient sitting up in bed with daughter in room preparing for a bath. Patient supervision for supine<>sit and supervision for sit<>stand.  Ambulated 200 ft with RW, step through pattern with decreased stride length, min guard for safety. Navigated 4 steps min guard with b/l UE support on rails. Demonstrated ankle pumps x 10 and gluteal sets x 5 while seated. Discussed exercise program and Posterior hip precautions in booklet provided.Patient returned to recliner with call bell in reach. Patients PT goals can be met at her next venue of care.   Recommendations for follow up therapy are one component of a multi-disciplinary discharge planning process, led by the attending physician.  Recommendations may be updated based on patient status, additional functional criteria and insurance authorization.  Follow Up Recommendations       Assistance Recommended at Discharge Intermittent Supervision/Assistance  Patient can return home with the following A little help with walking and/or transfers;A little help with bathing/dressing/bathroom;Assistance with cooking/housework;Help with stairs or ramp for entrance;Assist for transportation   Equipment Recommendations       Recommendations for Other Services       Precautions / Restrictions Precautions Precautions: Posterior Hip Precaution Booklet Issued: Yes (comment) Precaution Comments: no flex >90, no IR, no adduction Restrictions Weight Bearing Restrictions: Yes LLE Weight Bearing: Weight bearing as tolerated     Mobility  Bed Mobility Overal bed mobility: Needs Assistance Bed Mobility: Supine to Sit     Supine  to sit: Supervision          Transfers Overall transfer level: Needs assistance Equipment used: Rolling walker (2 wheels) Transfers: Sit to/from Stand Sit to Stand: Supervision           General transfer comment: Patient able to demonstrate precautions and proper sequencing under supervison.    Ambulation/Gait Ambulation/Gait assistance: Min guard Gait Distance (Feet): 200 Feet Assistive device: Rolling walker (2 wheels) Gait Pattern/deviations: Step-through pattern, Decreased stride length       General Gait Details: ambulates with appropriate distance with the walker, step through pattern, with decreased stride length. Patient noted that it felt leg her leg length discrepancy was improved post op.   Stairs Stairs: Yes Stairs assistance: Min guard Stair Management: Two rails, Step to pattern, Forwards Number of Stairs: 4     Wheelchair Mobility    Modified Rankin (Stroke Patients Only)       Balance Overall balance assessment: Needs assistance Sitting-balance support: Feet supported, Bilateral upper extremity supported       Standing balance support: Bilateral upper extremity supported, During functional activity, Reliant on assistive device for balance Standing balance-Leahy Scale: Normal                              Cognition Arousal/Alertness: Awake/alert Behavior During Therapy: WFL for tasks assessed/performed Overall Cognitive Status: Within Functional Limits for tasks assessed                                          Exercises Total Joint Exercises Ankle Circles/Pumps: AROM, 10 reps, Seated Gluteal Sets: AROM, 5 reps,  Seated    General Comments        Pertinent Vitals/Pain Pain Assessment Pain Assessment: No/denies pain    Home Living                          Prior Function            PT Goals (current goals can now be found in the care plan section) Acute Rehab PT Goals Patient Stated  Goal: To go home PT Goal Formulation: With patient/family Time For Goal Achievement: 10/06/22 Potential to Achieve Goals: Good Progress towards PT goals: Goals met/education completed, patient discharged from PT    Frequency    BID      PT Plan Current plan remains appropriate    Co-evaluation              AM-PAC PT "6 Clicks" Mobility   Outcome Measure  Help needed turning from your back to your side while in a flat bed without using bedrails?: None Help needed moving from lying on your back to sitting on the side of a flat bed without using bedrails?: None Help needed moving to and from a bed to a chair (including a wheelchair)?: A Little Help needed standing up from a chair using your arms (e.g., wheelchair or bedside chair)?: None Help needed to walk in hospital room?: A Little Help needed climbing 3-5 steps with a railing? : A Little 6 Click Score: 21    End of Session Equipment Utilized During Treatment: Gait belt Activity Tolerance: Patient tolerated treatment well Patient left: in chair;with call bell/phone within reach Nurse Communication: Mobility status PT Visit Diagnosis: Unsteadiness on feet (R26.81);Other abnormalities of gait and mobility (R26.89);Pain Pain - Right/Left: Left Pain - part of body: Hip     Time: 1610-9604 PT Time Calculation (min) (ACUTE ONLY): 23 min  Charges:                        Malachi Carl, SPT    Malachi Carl 09/23/2022, 10:34 AM

## 2022-09-23 NOTE — Plan of Care (Signed)
  Problem: Education: Goal: Knowledge of the prescribed therapeutic regimen will improve Outcome: Adequate for Discharge Goal: Understanding of discharge needs will improve Outcome: Adequate for Discharge Goal: Individualized Educational Video(s) Outcome: Adequate for Discharge   Problem: Activity: Goal: Ability to avoid complications of mobility impairment will improve Outcome: Adequate for Discharge Goal: Ability to tolerate increased activity will improve Outcome: Adequate for Discharge   Problem: Clinical Measurements: Goal: Postoperative complications will be avoided or minimized Outcome: Adequate for Discharge   Problem: Pain Management: Goal: Pain level will decrease with appropriate interventions Outcome: Adequate for Discharge   Problem: Skin Integrity: Goal: Will show signs of wound healing Outcome: Adequate for Discharge   

## 2022-09-23 NOTE — Progress Notes (Signed)
Patient discharged via wheelchair with all personal belongings. Patient's daughter at bedside when discharge instructions reviewed. Patient and daughter verbalized understanding. All questions answered. Patient discharged home and transported by friend in personal vehicle.

## 2022-09-24 DIAGNOSIS — J4520 Mild intermittent asthma, uncomplicated: Secondary | ICD-10-CM | POA: Diagnosis not present

## 2022-09-24 DIAGNOSIS — D649 Anemia, unspecified: Secondary | ICD-10-CM | POA: Diagnosis not present

## 2022-09-24 DIAGNOSIS — Z6832 Body mass index (BMI) 32.0-32.9, adult: Secondary | ICD-10-CM | POA: Diagnosis not present

## 2022-09-24 DIAGNOSIS — E119 Type 2 diabetes mellitus without complications: Secondary | ICD-10-CM | POA: Diagnosis not present

## 2022-09-24 DIAGNOSIS — Z791 Long term (current) use of non-steroidal anti-inflammatories (NSAID): Secondary | ICD-10-CM | POA: Diagnosis not present

## 2022-09-24 DIAGNOSIS — E669 Obesity, unspecified: Secondary | ICD-10-CM | POA: Diagnosis not present

## 2022-09-24 DIAGNOSIS — Z96642 Presence of left artificial hip joint: Secondary | ICD-10-CM | POA: Diagnosis not present

## 2022-09-24 DIAGNOSIS — Z79899 Other long term (current) drug therapy: Secondary | ICD-10-CM | POA: Diagnosis not present

## 2022-09-24 DIAGNOSIS — Z471 Aftercare following joint replacement surgery: Secondary | ICD-10-CM | POA: Diagnosis not present

## 2022-09-24 DIAGNOSIS — I890 Lymphedema, not elsewhere classified: Secondary | ICD-10-CM | POA: Diagnosis not present

## 2022-09-26 NOTE — Progress Notes (Signed)
  Perioperative Services Pre-Admission/Anesthesia Testing    Date: 09/22/2022  Name: Ashley [name] MRN:   [id number]  Re: GLP-1 clearance and provider recommendations   Planned Surgical Procedure(s):    Case: [id number] Anesthesia Start Date/Time: 09/22/22 0730   Procedure: Left posterior total hip arthroplasty (Left: Hip)   Anesthesia type: Spinal   Pre-op diagnosis:      Traumatic arthritis of hip, left M12.552     Left hip pain M25.552   Location: [facility] OR ROOM 01 / [facility]   Surgeons: Reinaldo Berber, MD      Clinical Notes:  Patient is scheduled for the above procedure with the indicated provider/surgeon. In review of her medication reconciliation it was noted that patient is on a prescribed GLP-1 medication. Per guidelines issued by the American Society of Anesthesiologists (ASA), it is recommended that these medications be held for 7 days prior to the patient undergoing any type of elective surgical procedure. The patient is taking the following GLP-1 medication:  []  SEMAGLUTIDE   []  EXENATIDE  []  LIRAGLUTIDE   []  LIXISENATIDE  []  DULAGLUTIDE     [x]  TIRZEPATIDE (GLP-1/GIP)  Reached out to prescribing provider Graciela Husbands, MD) to make them aware of the guidelines from anesthesia. Given that this patient takes the prescribed GLP-1 medication for her  diabetes diagnosis, rather than for weight loss, recommendations from the prescribing provider were solicited. Prescribing provider made aware of the following so that informed decision/POC can be developed for this patient that may be taking medications belonging to these drug classes:  Oral GLP-1 medications will be held 1 day prior to surgery.  Injectable GLP-1 medications will be held 7 days prior to surgery.  Metformin is routinely held 48 hours prior to surgery due to renal concerns, potential need for contrasted imaging perioperatively, and the potential for tissue hypoxia leading to drug  induced lactic acidosis.  All SGLT2i medications are held 72 hours prior to surgery as they can be associated with the increased potential for developing euglycemic diabetic ketoacidosis (EDKA).   Impression and Plan:  Ashley [name] is on a prescribed GLP-1 medication, which induces the known side effect of decreased gastric emptying. Efforts are bring made to mitigate the risk of perioperative hyperglycemic events, as elevated blood glucose levels have been found to contribute to intra/postoperative complications. Additionally, hyperglycemic extremes can potentially necessitate the postponing of a patient's elective case in order to better optimize perioperative glycemic control, again with the aforementioned guidelines in place. With this in mind, recommendations have been sought from the prescribing provider, who has cleared patient to proceed with holding the prescribed GLP-1/GIP as per the guidelines from the ASA.   Provider recommending: no further recommendations received from the prescribing provider.  Copy of signed clearance and recommendations placed on patient's chart for inclusion in their medical record and for review by the surgical/anesthetic team on the day of her procedure.   Quentin Mulling, MSN, APRN, FNP-C, CEN  [facility]  Peri-operative Services Nurse Practitioner Phone: [phone]  NOTE: This note has been prepared using Dragon dictation software. Despite my best ability to proofread, there is always the potential that unintentional transcriptional errors may still occur from this process.

## 2022-09-28 DIAGNOSIS — J4520 Mild intermittent asthma, uncomplicated: Secondary | ICD-10-CM | POA: Diagnosis not present

## 2022-09-28 DIAGNOSIS — E669 Obesity, unspecified: Secondary | ICD-10-CM | POA: Diagnosis not present

## 2022-09-28 DIAGNOSIS — Z96642 Presence of left artificial hip joint: Secondary | ICD-10-CM | POA: Diagnosis not present

## 2022-09-28 DIAGNOSIS — E119 Type 2 diabetes mellitus without complications: Secondary | ICD-10-CM | POA: Diagnosis not present

## 2022-09-28 DIAGNOSIS — Z6832 Body mass index (BMI) 32.0-32.9, adult: Secondary | ICD-10-CM | POA: Diagnosis not present

## 2022-09-28 DIAGNOSIS — Z791 Long term (current) use of non-steroidal anti-inflammatories (NSAID): Secondary | ICD-10-CM | POA: Diagnosis not present

## 2022-09-28 DIAGNOSIS — D649 Anemia, unspecified: Secondary | ICD-10-CM | POA: Diagnosis not present

## 2022-09-28 DIAGNOSIS — I890 Lymphedema, not elsewhere classified: Secondary | ICD-10-CM | POA: Diagnosis not present

## 2022-09-28 DIAGNOSIS — Z79899 Other long term (current) drug therapy: Secondary | ICD-10-CM | POA: Diagnosis not present

## 2022-09-28 DIAGNOSIS — Z471 Aftercare following joint replacement surgery: Secondary | ICD-10-CM | POA: Diagnosis not present

## 2022-09-30 DIAGNOSIS — Z791 Long term (current) use of non-steroidal anti-inflammatories (NSAID): Secondary | ICD-10-CM | POA: Diagnosis not present

## 2022-09-30 DIAGNOSIS — J4520 Mild intermittent asthma, uncomplicated: Secondary | ICD-10-CM | POA: Diagnosis not present

## 2022-09-30 DIAGNOSIS — I890 Lymphedema, not elsewhere classified: Secondary | ICD-10-CM | POA: Diagnosis not present

## 2022-09-30 DIAGNOSIS — Z471 Aftercare following joint replacement surgery: Secondary | ICD-10-CM | POA: Diagnosis not present

## 2022-09-30 DIAGNOSIS — E669 Obesity, unspecified: Secondary | ICD-10-CM | POA: Diagnosis not present

## 2022-09-30 DIAGNOSIS — Z6832 Body mass index (BMI) 32.0-32.9, adult: Secondary | ICD-10-CM | POA: Diagnosis not present

## 2022-09-30 DIAGNOSIS — Z96642 Presence of left artificial hip joint: Secondary | ICD-10-CM | POA: Diagnosis not present

## 2022-09-30 DIAGNOSIS — Z79899 Other long term (current) drug therapy: Secondary | ICD-10-CM | POA: Diagnosis not present

## 2022-09-30 DIAGNOSIS — D649 Anemia, unspecified: Secondary | ICD-10-CM | POA: Diagnosis not present

## 2022-09-30 DIAGNOSIS — E119 Type 2 diabetes mellitus without complications: Secondary | ICD-10-CM | POA: Diagnosis not present

## 2022-10-03 DIAGNOSIS — D649 Anemia, unspecified: Secondary | ICD-10-CM | POA: Diagnosis not present

## 2022-10-03 DIAGNOSIS — E119 Type 2 diabetes mellitus without complications: Secondary | ICD-10-CM | POA: Diagnosis not present

## 2022-10-03 DIAGNOSIS — Z471 Aftercare following joint replacement surgery: Secondary | ICD-10-CM | POA: Diagnosis not present

## 2022-10-03 DIAGNOSIS — I890 Lymphedema, not elsewhere classified: Secondary | ICD-10-CM | POA: Diagnosis not present

## 2022-10-03 DIAGNOSIS — Z96642 Presence of left artificial hip joint: Secondary | ICD-10-CM | POA: Diagnosis not present

## 2022-10-03 DIAGNOSIS — J4520 Mild intermittent asthma, uncomplicated: Secondary | ICD-10-CM | POA: Diagnosis not present

## 2022-10-03 DIAGNOSIS — E669 Obesity, unspecified: Secondary | ICD-10-CM | POA: Diagnosis not present

## 2022-10-03 DIAGNOSIS — Z791 Long term (current) use of non-steroidal anti-inflammatories (NSAID): Secondary | ICD-10-CM | POA: Diagnosis not present

## 2022-10-03 DIAGNOSIS — Z6832 Body mass index (BMI) 32.0-32.9, adult: Secondary | ICD-10-CM | POA: Diagnosis not present

## 2022-10-03 DIAGNOSIS — Z79899 Other long term (current) drug therapy: Secondary | ICD-10-CM | POA: Diagnosis not present

## 2022-10-06 DIAGNOSIS — J4520 Mild intermittent asthma, uncomplicated: Secondary | ICD-10-CM | POA: Diagnosis not present

## 2022-10-06 DIAGNOSIS — Z471 Aftercare following joint replacement surgery: Secondary | ICD-10-CM | POA: Diagnosis not present

## 2022-10-06 DIAGNOSIS — E119 Type 2 diabetes mellitus without complications: Secondary | ICD-10-CM | POA: Diagnosis not present

## 2022-10-06 DIAGNOSIS — E669 Obesity, unspecified: Secondary | ICD-10-CM | POA: Diagnosis not present

## 2022-10-06 DIAGNOSIS — Z96642 Presence of left artificial hip joint: Secondary | ICD-10-CM | POA: Diagnosis not present

## 2022-10-06 DIAGNOSIS — Z6832 Body mass index (BMI) 32.0-32.9, adult: Secondary | ICD-10-CM | POA: Diagnosis not present

## 2022-10-06 DIAGNOSIS — Z791 Long term (current) use of non-steroidal anti-inflammatories (NSAID): Secondary | ICD-10-CM | POA: Diagnosis not present

## 2022-10-06 DIAGNOSIS — I890 Lymphedema, not elsewhere classified: Secondary | ICD-10-CM | POA: Diagnosis not present

## 2022-10-06 DIAGNOSIS — Z79899 Other long term (current) drug therapy: Secondary | ICD-10-CM | POA: Diagnosis not present

## 2022-10-06 DIAGNOSIS — D649 Anemia, unspecified: Secondary | ICD-10-CM | POA: Diagnosis not present

## 2022-10-10 DIAGNOSIS — Z79899 Other long term (current) drug therapy: Secondary | ICD-10-CM | POA: Diagnosis not present

## 2022-10-10 DIAGNOSIS — D649 Anemia, unspecified: Secondary | ICD-10-CM | POA: Diagnosis not present

## 2022-10-10 DIAGNOSIS — Z6832 Body mass index (BMI) 32.0-32.9, adult: Secondary | ICD-10-CM | POA: Diagnosis not present

## 2022-10-10 DIAGNOSIS — E119 Type 2 diabetes mellitus without complications: Secondary | ICD-10-CM | POA: Diagnosis not present

## 2022-10-10 DIAGNOSIS — Z471 Aftercare following joint replacement surgery: Secondary | ICD-10-CM | POA: Diagnosis not present

## 2022-10-10 DIAGNOSIS — I890 Lymphedema, not elsewhere classified: Secondary | ICD-10-CM | POA: Diagnosis not present

## 2022-10-10 DIAGNOSIS — Z791 Long term (current) use of non-steroidal anti-inflammatories (NSAID): Secondary | ICD-10-CM | POA: Diagnosis not present

## 2022-10-10 DIAGNOSIS — E669 Obesity, unspecified: Secondary | ICD-10-CM | POA: Diagnosis not present

## 2022-10-10 DIAGNOSIS — J4520 Mild intermittent asthma, uncomplicated: Secondary | ICD-10-CM | POA: Diagnosis not present

## 2022-10-10 DIAGNOSIS — Z96642 Presence of left artificial hip joint: Secondary | ICD-10-CM | POA: Diagnosis not present

## 2022-10-14 DIAGNOSIS — I890 Lymphedema, not elsewhere classified: Secondary | ICD-10-CM | POA: Diagnosis not present

## 2022-10-14 DIAGNOSIS — D649 Anemia, unspecified: Secondary | ICD-10-CM | POA: Diagnosis not present

## 2022-10-14 DIAGNOSIS — Z96642 Presence of left artificial hip joint: Secondary | ICD-10-CM | POA: Diagnosis not present

## 2022-10-14 DIAGNOSIS — Z6832 Body mass index (BMI) 32.0-32.9, adult: Secondary | ICD-10-CM | POA: Diagnosis not present

## 2022-10-14 DIAGNOSIS — Z791 Long term (current) use of non-steroidal anti-inflammatories (NSAID): Secondary | ICD-10-CM | POA: Diagnosis not present

## 2022-10-14 DIAGNOSIS — E119 Type 2 diabetes mellitus without complications: Secondary | ICD-10-CM | POA: Diagnosis not present

## 2022-10-14 DIAGNOSIS — E669 Obesity, unspecified: Secondary | ICD-10-CM | POA: Diagnosis not present

## 2022-10-14 DIAGNOSIS — Z79899 Other long term (current) drug therapy: Secondary | ICD-10-CM | POA: Diagnosis not present

## 2022-10-14 DIAGNOSIS — Z471 Aftercare following joint replacement surgery: Secondary | ICD-10-CM | POA: Diagnosis not present

## 2022-10-14 DIAGNOSIS — J4520 Mild intermittent asthma, uncomplicated: Secondary | ICD-10-CM | POA: Diagnosis not present

## 2022-10-18 DIAGNOSIS — I890 Lymphedema, not elsewhere classified: Secondary | ICD-10-CM | POA: Diagnosis not present

## 2022-10-18 DIAGNOSIS — Z791 Long term (current) use of non-steroidal anti-inflammatories (NSAID): Secondary | ICD-10-CM | POA: Diagnosis not present

## 2022-10-18 DIAGNOSIS — E669 Obesity, unspecified: Secondary | ICD-10-CM | POA: Diagnosis not present

## 2022-10-18 DIAGNOSIS — J4520 Mild intermittent asthma, uncomplicated: Secondary | ICD-10-CM | POA: Diagnosis not present

## 2022-10-18 DIAGNOSIS — Z96642 Presence of left artificial hip joint: Secondary | ICD-10-CM | POA: Diagnosis not present

## 2022-10-18 DIAGNOSIS — E119 Type 2 diabetes mellitus without complications: Secondary | ICD-10-CM | POA: Diagnosis not present

## 2022-10-18 DIAGNOSIS — Z6832 Body mass index (BMI) 32.0-32.9, adult: Secondary | ICD-10-CM | POA: Diagnosis not present

## 2022-10-18 DIAGNOSIS — D649 Anemia, unspecified: Secondary | ICD-10-CM | POA: Diagnosis not present

## 2022-10-18 DIAGNOSIS — Z79899 Other long term (current) drug therapy: Secondary | ICD-10-CM | POA: Diagnosis not present

## 2022-10-18 DIAGNOSIS — Z471 Aftercare following joint replacement surgery: Secondary | ICD-10-CM | POA: Diagnosis not present

## 2022-10-19 DIAGNOSIS — I890 Lymphedema, not elsewhere classified: Secondary | ICD-10-CM | POA: Diagnosis not present

## 2022-10-19 DIAGNOSIS — Z6832 Body mass index (BMI) 32.0-32.9, adult: Secondary | ICD-10-CM | POA: Diagnosis not present

## 2022-10-19 DIAGNOSIS — Z79899 Other long term (current) drug therapy: Secondary | ICD-10-CM | POA: Diagnosis not present

## 2022-10-19 DIAGNOSIS — J4520 Mild intermittent asthma, uncomplicated: Secondary | ICD-10-CM | POA: Diagnosis not present

## 2022-10-19 DIAGNOSIS — E119 Type 2 diabetes mellitus without complications: Secondary | ICD-10-CM | POA: Diagnosis not present

## 2022-10-19 DIAGNOSIS — Z791 Long term (current) use of non-steroidal anti-inflammatories (NSAID): Secondary | ICD-10-CM | POA: Diagnosis not present

## 2022-10-19 DIAGNOSIS — D649 Anemia, unspecified: Secondary | ICD-10-CM | POA: Diagnosis not present

## 2022-10-19 DIAGNOSIS — Z96642 Presence of left artificial hip joint: Secondary | ICD-10-CM | POA: Diagnosis not present

## 2022-10-19 DIAGNOSIS — Z471 Aftercare following joint replacement surgery: Secondary | ICD-10-CM | POA: Diagnosis not present

## 2022-10-19 DIAGNOSIS — E669 Obesity, unspecified: Secondary | ICD-10-CM | POA: Diagnosis not present

## 2022-10-21 DIAGNOSIS — Z96642 Presence of left artificial hip joint: Secondary | ICD-10-CM | POA: Diagnosis not present

## 2022-10-24 ENCOUNTER — Other Ambulatory Visit: Payer: Self-pay

## 2022-10-24 DIAGNOSIS — Z96642 Presence of left artificial hip joint: Secondary | ICD-10-CM | POA: Diagnosis not present

## 2022-10-26 ENCOUNTER — Other Ambulatory Visit: Payer: Self-pay

## 2022-10-26 DIAGNOSIS — Z96642 Presence of left artificial hip joint: Secondary | ICD-10-CM | POA: Diagnosis not present

## 2022-10-31 DIAGNOSIS — M25552 Pain in left hip: Secondary | ICD-10-CM | POA: Diagnosis not present

## 2022-10-31 DIAGNOSIS — Z96642 Presence of left artificial hip joint: Secondary | ICD-10-CM | POA: Diagnosis not present

## 2022-11-01 ENCOUNTER — Other Ambulatory Visit: Payer: Self-pay

## 2022-11-01 MED ORDER — OMEPRAZOLE 40 MG PO CPDR
40.0000 mg | DELAYED_RELEASE_CAPSULE | Freq: Every day | ORAL | 1 refills | Status: DC
  Filled 2022-11-01: qty 180, 90d supply, fill #0
  Filled 2023-03-06: qty 180, 90d supply, fill #1

## 2022-11-02 DIAGNOSIS — Z96642 Presence of left artificial hip joint: Secondary | ICD-10-CM | POA: Diagnosis not present

## 2022-11-02 DIAGNOSIS — M25552 Pain in left hip: Secondary | ICD-10-CM | POA: Diagnosis not present

## 2022-11-04 DIAGNOSIS — Z96642 Presence of left artificial hip joint: Secondary | ICD-10-CM | POA: Diagnosis not present

## 2022-11-07 DIAGNOSIS — Z96642 Presence of left artificial hip joint: Secondary | ICD-10-CM | POA: Diagnosis not present

## 2022-11-08 ENCOUNTER — Other Ambulatory Visit (HOSPITAL_COMMUNITY): Payer: Self-pay

## 2022-11-09 DIAGNOSIS — Z96642 Presence of left artificial hip joint: Secondary | ICD-10-CM | POA: Diagnosis not present

## 2022-11-14 DIAGNOSIS — Z96642 Presence of left artificial hip joint: Secondary | ICD-10-CM | POA: Diagnosis not present

## 2022-11-16 ENCOUNTER — Other Ambulatory Visit: Payer: Self-pay

## 2022-11-16 DIAGNOSIS — Z96642 Presence of left artificial hip joint: Secondary | ICD-10-CM | POA: Diagnosis not present

## 2022-11-22 DIAGNOSIS — Z96642 Presence of left artificial hip joint: Secondary | ICD-10-CM | POA: Diagnosis not present

## 2022-11-25 DIAGNOSIS — Z96642 Presence of left artificial hip joint: Secondary | ICD-10-CM | POA: Diagnosis not present

## 2022-11-29 ENCOUNTER — Other Ambulatory Visit: Payer: Self-pay

## 2022-11-29 DIAGNOSIS — Z96642 Presence of left artificial hip joint: Secondary | ICD-10-CM | POA: Diagnosis not present

## 2022-11-30 DIAGNOSIS — M25521 Pain in right elbow: Secondary | ICD-10-CM | POA: Diagnosis not present

## 2022-11-30 DIAGNOSIS — M19121 Post-traumatic osteoarthritis, right elbow: Secondary | ICD-10-CM | POA: Diagnosis not present

## 2022-11-30 DIAGNOSIS — M24021 Loose body in right elbow: Secondary | ICD-10-CM | POA: Diagnosis not present

## 2022-11-30 DIAGNOSIS — G5601 Carpal tunnel syndrome, right upper limb: Secondary | ICD-10-CM | POA: Diagnosis not present

## 2022-11-30 DIAGNOSIS — E042 Nontoxic multinodular goiter: Secondary | ICD-10-CM | POA: Diagnosis not present

## 2022-11-30 DIAGNOSIS — M75101 Unspecified rotator cuff tear or rupture of right shoulder, not specified as traumatic: Secondary | ICD-10-CM | POA: Diagnosis not present

## 2022-12-08 DIAGNOSIS — Z96642 Presence of left artificial hip joint: Secondary | ICD-10-CM | POA: Diagnosis not present

## 2022-12-20 DIAGNOSIS — Z96642 Presence of left artificial hip joint: Secondary | ICD-10-CM | POA: Diagnosis not present

## 2022-12-21 DIAGNOSIS — E042 Nontoxic multinodular goiter: Secondary | ICD-10-CM | POA: Diagnosis not present

## 2022-12-21 DIAGNOSIS — E559 Vitamin D deficiency, unspecified: Secondary | ICD-10-CM | POA: Diagnosis not present

## 2022-12-21 DIAGNOSIS — E213 Hyperparathyroidism, unspecified: Secondary | ICD-10-CM | POA: Diagnosis not present

## 2022-12-22 ENCOUNTER — Other Ambulatory Visit: Payer: Self-pay

## 2022-12-23 DIAGNOSIS — Z96642 Presence of left artificial hip joint: Secondary | ICD-10-CM | POA: Diagnosis not present

## 2022-12-26 DIAGNOSIS — H903 Sensorineural hearing loss, bilateral: Secondary | ICD-10-CM | POA: Diagnosis not present

## 2022-12-26 DIAGNOSIS — Z96642 Presence of left artificial hip joint: Secondary | ICD-10-CM | POA: Diagnosis not present

## 2022-12-28 ENCOUNTER — Other Ambulatory Visit: Payer: Self-pay

## 2022-12-28 MED ORDER — BUSPIRONE HCL 7.5 MG PO TABS
7.5000 mg | ORAL_TABLET | Freq: Two times a day (BID) | ORAL | 3 refills | Status: DC | PRN
  Filled 2022-12-28: qty 180, 90d supply, fill #0
  Filled 2023-09-19 (×2): qty 180, 90d supply, fill #1

## 2022-12-28 MED ORDER — ESCITALOPRAM OXALATE 10 MG PO TABS
10.0000 mg | ORAL_TABLET | Freq: Every day | ORAL | 3 refills | Status: DC
  Filled 2022-12-28: qty 90, 90d supply, fill #0
  Filled 2023-04-12: qty 90, 90d supply, fill #1
  Filled 2023-07-24: qty 90, 90d supply, fill #2
  Filled 2023-12-01: qty 90, 90d supply, fill #3

## 2023-01-03 ENCOUNTER — Other Ambulatory Visit: Payer: Self-pay

## 2023-01-03 DIAGNOSIS — E119 Type 2 diabetes mellitus without complications: Secondary | ICD-10-CM | POA: Diagnosis not present

## 2023-01-03 DIAGNOSIS — Z96642 Presence of left artificial hip joint: Secondary | ICD-10-CM | POA: Diagnosis not present

## 2023-01-03 DIAGNOSIS — E785 Hyperlipidemia, unspecified: Secondary | ICD-10-CM | POA: Diagnosis not present

## 2023-01-03 DIAGNOSIS — E559 Vitamin D deficiency, unspecified: Secondary | ICD-10-CM | POA: Diagnosis not present

## 2023-01-04 DIAGNOSIS — Z96642 Presence of left artificial hip joint: Secondary | ICD-10-CM | POA: Diagnosis not present

## 2023-01-05 DIAGNOSIS — Z96642 Presence of left artificial hip joint: Secondary | ICD-10-CM | POA: Diagnosis not present

## 2023-01-10 DIAGNOSIS — K219 Gastro-esophageal reflux disease without esophagitis: Secondary | ICD-10-CM | POA: Diagnosis not present

## 2023-01-10 DIAGNOSIS — Z9884 Bariatric surgery status: Secondary | ICD-10-CM | POA: Diagnosis not present

## 2023-01-10 DIAGNOSIS — E559 Vitamin D deficiency, unspecified: Secondary | ICD-10-CM | POA: Diagnosis not present

## 2023-01-10 DIAGNOSIS — E785 Hyperlipidemia, unspecified: Secondary | ICD-10-CM | POA: Diagnosis not present

## 2023-01-10 DIAGNOSIS — D649 Anemia, unspecified: Secondary | ICD-10-CM | POA: Diagnosis not present

## 2023-01-10 DIAGNOSIS — J4520 Mild intermittent asthma, uncomplicated: Secondary | ICD-10-CM | POA: Diagnosis not present

## 2023-01-10 DIAGNOSIS — Z96642 Presence of left artificial hip joint: Secondary | ICD-10-CM | POA: Diagnosis not present

## 2023-01-10 DIAGNOSIS — G4733 Obstructive sleep apnea (adult) (pediatric): Secondary | ICD-10-CM | POA: Diagnosis not present

## 2023-01-10 DIAGNOSIS — E119 Type 2 diabetes mellitus without complications: Secondary | ICD-10-CM | POA: Diagnosis not present

## 2023-01-10 DIAGNOSIS — Z9889 Other specified postprocedural states: Secondary | ICD-10-CM | POA: Diagnosis not present

## 2023-01-12 DIAGNOSIS — Z96642 Presence of left artificial hip joint: Secondary | ICD-10-CM | POA: Diagnosis not present

## 2023-01-18 ENCOUNTER — Other Ambulatory Visit: Payer: Self-pay

## 2023-01-19 ENCOUNTER — Other Ambulatory Visit: Payer: Self-pay

## 2023-01-19 MED ORDER — TIRZEPATIDE 15 MG/0.5ML ~~LOC~~ SOAJ
15.0000 mg | SUBCUTANEOUS | 3 refills | Status: DC
  Filled 2023-01-19: qty 2, 28d supply, fill #0
  Filled 2023-02-15: qty 2, 28d supply, fill #1
  Filled 2023-03-24: qty 2, 28d supply, fill #2
  Filled 2023-04-19: qty 2, 28d supply, fill #3
  Filled 2023-05-16: qty 2, 28d supply, fill #4
  Filled 2023-06-14: qty 2, 28d supply, fill #5
  Filled 2023-07-17: qty 2, 28d supply, fill #6
  Filled 2023-08-18: qty 2, 28d supply, fill #7
  Filled 2023-09-19 (×2): qty 2, 28d supply, fill #8
  Filled 2023-10-19: qty 2, 28d supply, fill #9
  Filled 2023-12-01: qty 2, 28d supply, fill #10
  Filled 2023-12-28: qty 2, 28d supply, fill #11

## 2023-01-23 ENCOUNTER — Other Ambulatory Visit: Payer: Self-pay

## 2023-01-23 DIAGNOSIS — Z96642 Presence of left artificial hip joint: Secondary | ICD-10-CM | POA: Diagnosis not present

## 2023-01-31 DIAGNOSIS — E119 Type 2 diabetes mellitus without complications: Secondary | ICD-10-CM | POA: Diagnosis not present

## 2023-02-15 ENCOUNTER — Other Ambulatory Visit: Payer: Self-pay

## 2023-02-15 MED ORDER — VITAMIN D (ERGOCALCIFEROL) 1.25 MG (50000 UNIT) PO CAPS
ORAL_CAPSULE | ORAL | 4 refills | Status: DC
  Filled 2023-02-15: qty 13, fill #0

## 2023-02-15 MED ORDER — VITAMIN D (ERGOCALCIFEROL) 1.25 MG (50000 UNIT) PO CAPS
50000.0000 [IU] | ORAL_CAPSULE | ORAL | 4 refills | Status: AC
  Filled 2023-02-15: qty 13, 90d supply, fill #0
  Filled 2023-05-16: qty 13, 90d supply, fill #1
  Filled 2023-09-19 (×2): qty 13, 90d supply, fill #2
  Filled 2023-12-01: qty 13, 90d supply, fill #3

## 2023-03-07 ENCOUNTER — Other Ambulatory Visit: Payer: Self-pay

## 2023-03-13 ENCOUNTER — Other Ambulatory Visit: Payer: Self-pay

## 2023-03-24 ENCOUNTER — Other Ambulatory Visit: Payer: Self-pay

## 2023-04-13 ENCOUNTER — Other Ambulatory Visit: Payer: Self-pay

## 2023-04-19 ENCOUNTER — Other Ambulatory Visit: Payer: Self-pay

## 2023-05-16 ENCOUNTER — Other Ambulatory Visit: Payer: Self-pay

## 2023-05-31 ENCOUNTER — Encounter: Payer: Self-pay | Admitting: Physician Assistant

## 2023-05-31 ENCOUNTER — Ambulatory Visit
Admission: EM | Admit: 2023-05-31 | Discharge: 2023-05-31 | Disposition: A | Discharge status: Home / Self Care | Attending: Physician Assistant | Admitting: Physician Assistant

## 2023-05-31 ENCOUNTER — Other Ambulatory Visit: Payer: Self-pay

## 2023-05-31 DIAGNOSIS — R0981 Nasal congestion: Secondary | ICD-10-CM | POA: Diagnosis not present

## 2023-05-31 DIAGNOSIS — R051 Acute cough: Secondary | ICD-10-CM | POA: Diagnosis not present

## 2023-05-31 DIAGNOSIS — J069 Acute upper respiratory infection, unspecified: Secondary | ICD-10-CM | POA: Diagnosis not present

## 2023-05-31 LAB — SARS CORONAVIRUS 2 BY RT PCR: SARS Coronavirus 2 by RT PCR: NEGATIVE

## 2023-05-31 MED ORDER — IPRATROPIUM BROMIDE 0.06 % NA SOLN
2.0000 | Freq: Four times a day (QID) | NASAL | 0 refills | Status: AC
  Filled 2023-05-31: qty 15, 19d supply, fill #0

## 2023-05-31 MED ORDER — PSEUDOEPH-BROMPHEN-DM 30-2-10 MG/5ML PO SYRP
10.0000 mL | ORAL_SOLUTION | Freq: Four times a day (QID) | ORAL | 0 refills | Status: AC | PRN
  Filled 2023-05-31: qty 118, 3d supply, fill #0

## 2023-05-31 NOTE — ED Triage Notes (Signed)
 Pt c/o HA & sinus congestion x3 days. Has tried mucinex & Excedrin w/o relief.

## 2023-05-31 NOTE — ED Provider Notes (Signed)
 [facility]    CSN: [id number] Arrival date & time: 05/31/23  0818      History   Chief Complaint Chief Complaint  Patient presents with   Headache   Sinus Problem    HPI Ashley [name] is a 58 y.o. female presenting for fatigue, headaches, post nasal drainage, sneezing, slight cough, nasal congestion and runny nose x 3 days.  Denies fever, sore throat, productive cough, chest pain/tightness, wheezing, shortness of breath.  Unsure of any sick contacts.  Has been taking Mucinex and Excedrin without relief.  Medical history is significant for asthma, diabetes, GERD, sleep apnea, thyroid nodules, polyarthritis, and anemia.  HPI  Past Medical History:  Diagnosis Date   Allergy    Anemia    Arthritis    back, left elbow, left hip, left ankle    Asthma    Blood transfusion without reported diagnosis    Complication of anesthesia    delayed emergence   Diabetes mellitus without complication (HCC)    diet controlled   Diverticulosis    GERD (gastroesophageal reflux disease)    Headache    History of hiatal hernia    History of kidney stones    Hyperparathyroidism (HCC)    Lymphedema    PONV (postoperative nausea and vomiting)    S/P subtotal parathyroidectomy    Seizure (HCC)    as a child   Sleep apnea    cannot tolerate mask   Thyroid nodule 06/20/2018   Varicose vein of leg     Patient Active Problem List   Diagnosis Date Noted   Osteoarthritis of left hip 09/22/2022   Side pain 08/16/2022   Musculoskeletal pain 08/16/2022   Varicose veins of both legs with edema 01/05/2021   Swelling of limb 12/30/2020   Diabetes (HCC) 12/30/2020   Lymphedema 12/30/2020   S/P subtotal parathyroidectomy 11/08/2018   Hyperparathyroidism (HCC) 06/20/2018   Thyroid nodule 06/20/2018   Intractable pain 04/12/2015    Past Surgical History:  Procedure Laterality Date   ACETABULUM FRACTURE SURGERY Left    CESAREAN SECTION     CHOLECYSTECTOMY      COLONOSCOPY WITH PROPOFOL N/A 05/31/2021   Procedure: COLONOSCOPY WITH PROPOFOL;  Surgeon: Regis Bill, MD;  Location: [facility];  Service: Endoscopy;  Laterality: N/A;   ELBOW FRACTURE SURGERY  1995   ESOPHAGOGASTRODUODENOSCOPY (EGD) WITH PROPOFOL N/A 05/31/2021   Procedure: ESOPHAGOGASTRODUODENOSCOPY (EGD) WITH PROPOFOL;  Surgeon: Regis Bill, MD;  Location: [facility];  Service: Endoscopy;  Laterality: N/A;   FRACTURE SURGERY     left ankle complete repair, left elbow   HERNIA REPAIR     incisional   LAPAROSCOPIC GASTRIC SLEEVE RESECTION  2014   PARATHYROIDECTOMY N/A 10/25/2018   Procedure: PARATHYROIDECTOMY, DIABETIC, SLEEP APNEA;  Surgeon: Duanne Guess, MD;  Location: [facility];  Service: General;  Laterality: N/A;   TONSILLECTOMY AND ADENOIDECTOMY N/A 12/08/2015   Procedure: TONSILLECTOMY AND ADENOIDECTOMY;  Surgeon: Linus Salmons, MD;  Location: [facility];  Service: ENT;  Laterality: N/A;   TOTAL HIP ARTHROPLASTY Left 09/22/2022   Procedure: Left posterior total hip arthroplasty;  Surgeon: Reinaldo Berber, MD;  Location: [facility];  Service: Orthopedics;  Laterality: Left;   TUBAL LIGATION      OB History   No obstetric history on file.      Home Medications    Prior to Admission medications   Medication Sig Start Date End Date Taking? Authorizing Provider  brompheniramine-pseudoephedrine-DM 30-2-10 MG/5ML syrup Take 10 mLs by  mouth 4 (four) times daily as needed for up to 7 days. 05/31/23 06/07/23 Yes Eusebio Friendly B, PA-C  ipratropium (ATROVENT) 0.06 % nasal spray Place 2 sprays into both nostrils 4 (four) times daily. 05/31/23  Yes Shirlee Latch, PA-C  acetaminophen (TYLENOL) 500 MG tablet Take 2 tablets (1,000 mg total) by mouth every 8 (eight) hours. 09/23/22   Evon Slack, PA-C  albuterol (PROVENTIL HFA;VENTOLIN HFA) 108 (90 BASE) MCG/ACT inhaler Inhale 2 puffs into the lungs every 6 (six) hours as needed for wheezing or shortness of breath.     [provider]  Artificial Tear Solution (SOOTHE XP OP) Place 1 drop into both eyes daily.     [provider]  Biotin 2500 MCG CAPS Take 5,000 mcg by mouth daily.    [provider]  budesonide-formoterol (SYMBICORT) 80-4.5 MCG/ACT inhaler Inhale into the lungs. Patient taking differently: Inhale 2 puffs into the lungs as needed. 12/20/21     busPIRone (BUSPAR) 7.5 MG tablet Take 1 tablet (7.5 mg total) by mouth 2 (two) times daily as needed. 12/28/22     Cholecalciferol 125 MCG (5000 UT) capsule Take 5,000 Units by mouth once a week. mondays    [provider]  docusate sodium (COLACE) 100 MG capsule Take 1 capsule (100 mg total) by mouth 2 (two) times daily. 09/23/22   Evon Slack, PA-C  enoxaparin (LOVENOX) 40 MG/0.4ML injection Inject 0.4 mLs (40 mg total) into the skin daily for 14 days. 09/23/22 10/07/22  Evon Slack, PA-C  EPINEPHrine 0.3 mg/0.3 mL IJ SOAJ injection Inject 0.3 mg into the muscle as needed (for anaphylaxis).    [provider]  escitalopram (LEXAPRO) 10 MG tablet Take 10 mg by mouth every morning.    [provider]  escitalopram (LEXAPRO) 10 MG tablet Take 1 tablet (10 mg total) by mouth daily. 12/28/22     ferrous sulfate 325 (65 FE) MG tablet Take 325 mg by mouth daily with breakfast.     [provider]  fluticasone (FLONASE) 50 MCG/ACT nasal spray Place 1 spray into both nostrils every morning.    [provider]  Lifitegrast 5 % SOLN Place 1 drop into both eyes at bedtime.    [provider]  meclizine (ANTIVERT) 25 MG tablet Take 25 mg by mouth 3 (three) times daily as needed for dizziness.    [provider]  metoCLOPramide (REGLAN) 5 MG tablet Take 1-2 tablets (5-10 mg total) by mouth every 8 (eight) hours as needed for nausea (if ondansetron (ZOFRAN) ineffective.). 09/23/22   Evon Slack, PA-C  Multiple Vitamins-Minerals (MULTIVITAMIN WITH MINERALS) tablet Take 1  tablet by mouth daily.    [provider]  omeprazole (PRILOSEC) 40 MG capsule Take 1-2 capsules (40-80 mg total) by mouth daily. 11/01/22     oxyCODONE (ROXICODONE) 5 MG immediate release tablet Take 0.5 tablets (2.5 mg total) by mouth every 8 (eight) hours as needed for breakthrough pain. 09/23/22 09/23/23  Evon Slack, PA-C  pregabalin (LYRICA) 25 MG capsule Take 1 capsule (25 mg total) by mouth 2 (two) times daily. Patient taking differently: Take 25 mg by mouth at bedtime. 08/23/22     prochlorperazine (COMPAZINE) 10 MG tablet Take 10 mg by mouth every 6 (six) hours as needed for nausea or vomiting.    [provider]  tirzepatide Greggory Keen) 15 MG/0.5ML Pen Inject 15 mg into the skin once a week. 01/19/23     topiramate (TOPAMAX) 50  MG tablet Take 50 mg by mouth as needed. 08/22/18   [provider]  traMADol (ULTRAM) 50 MG tablet Take 1 tablet (50 mg total) by mouth every 6 (six) hours as needed for moderate pain. 09/23/22   Evon Slack, PA-C  Vitamin D, Ergocalciferol, (DRISDOL) 1.25 MG (50000 UNIT) CAPS capsule Take 1 capsule (50,000 Units total) by mouth once a week. 02/15/23   Sherlon Handing, MD  vitamin E 180 MG (400 UNITS) capsule Take 400 Units by mouth daily.    [provider]  Wheat Dextrin (BENEFIBER) CHEW Chew 2 tablets by mouth daily.    [provider]    Family History Family History  Problem Relation Age of Onset   Diabetes Mother    Hypertension Mother    Cancer Mother    Asthma Mother    Diabetes Father    Hyperlipidemia Father    Hypertension Father    Kidney disease Father    Heart disease Father    Diabetes Maternal Aunt    Diabetes Maternal Uncle    Diabetes Paternal Aunt    Diabetes Paternal Uncle    Breast cancer Maternal Grandmother 71   Breast cancer Paternal Grandmother     Social History Social History   Tobacco Use   Smoking status: Never   Smokeless tobacco: Never  Vaping Use   Vaping  status: Never Used  Substance Use Topics   Alcohol use: No    Alcohol/week: 0.0 standard drinks of alcohol   Drug use: No     Allergies   Bee venom, Influenza vac split [influenza virus vaccine], Phenergan [promethazine], Zofran [ondansetron], Dilaudid [hydromorphone], Metformin, Orange fruit [citrus], Other, and Penicillins   Review of Systems Review of Systems  Constitutional:  Positive for fatigue. Negative for chills, diaphoresis and fever.  HENT:  Positive for congestion, rhinorrhea, sinus pressure and sneezing. Negative for ear pain and sore throat.   Respiratory:  Positive for cough. Negative for shortness of breath.   Cardiovascular:  Negative for chest pain.  Gastrointestinal:  Negative for abdominal pain, nausea and vomiting.  Musculoskeletal:  Positive for arthralgias (chronic).  Skin:  Negative for rash.  Neurological:  Positive for headaches. Negative for weakness.  Hematological:  Negative for adenopathy.     Physical Exam Triage Vital Signs ED Triage Vitals  Encounter Vitals Group     BP      Systolic BP Percentile      Diastolic BP Percentile      Pulse      Resp      Temp      Temp src      SpO2      Weight      Height      Head Circumference      Peak Flow      Pain Score      Pain Loc      Pain Education      Exclude from Growth Chart    No data found.  Updated Vital Signs BP 121/79 (BP Location: Left Arm)   Pulse (!) 102   Temp 99.2 F (37.3 C) (Oral)   Resp 16   Ht 5\' 6"  (1.676 m)   Wt 180 lb (81.6 kg)   SpO2 100%   BMI 29.05 kg/m    Physical Exam Vitals and nursing note reviewed.  Constitutional:      General: She is not in acute distress.    Appearance: Normal appearance. She is not  ill-appearing or toxic-appearing.  HENT:     Head: Normocephalic and atraumatic.     Right Ear: Tympanic membrane, ear canal and external ear normal.     Left Ear: Tympanic membrane, ear canal and external ear normal.     Nose: Congestion  present.     Mouth/Throat:     Mouth: Mucous membranes are moist.     Pharynx: Oropharynx is clear.  Eyes:     General: No scleral icterus.       Right eye: No discharge.        Left eye: No discharge.     Conjunctiva/sclera: Conjunctivae normal.  Cardiovascular:     Rate and Rhythm: Regular rhythm. Tachycardia present.     Heart sounds: Normal heart sounds.  Pulmonary:     Effort: Pulmonary effort is normal. No respiratory distress.     Breath sounds: Normal breath sounds.  Musculoskeletal:     Cervical back: Neck supple.  Skin:    General: Skin is dry.  Neurological:     General: No focal deficit present.     Mental Status: She is alert. Mental status is at baseline.     Motor: No weakness.     Gait: Gait normal.  Psychiatric:        Mood and Affect: Mood normal.        Behavior: Behavior normal.      UC Treatments / Results  Labs (all labs ordered are listed, but only abnormal results are displayed) Labs Reviewed  SARS CORONAVIRUS 2 BY RT PCR    EKG   Radiology No results found.  Procedures Procedures (including critical care time)  Medications Ordered in UC Medications - No data to display  Initial Impression / Assessment and Plan / UC Course  I have reviewed the triage vital signs and the nursing notes.  Pertinent labs & imaging results that were available during my care of the patient were reviewed by me and considered in my medical decision making (see chart for details).   58 year old female presents for 3-day history of fatigue, nasal congestion, sinus pressure , slight cough, and headaches.  Denies fever, fatigue, cough, sore throat, chest pain/tightness, wheezing or shortness of breath.  Patient is overall well-appearing and afebrile.  On exam no evidence of ear infection.  Has mild nasal congestion.  Throat clear.  Chest clear auscultation.  Heart regular rhythm but mildly tachycardic at 102 bpm.  PCR COVID test obtained.  Viral URI.   Supportive care discussed. Prescribed Bromfed DM and Atrovent nasal spray.  Reviewed typical course of most viral illnesses.  Reviewed return precautions.  Work note given.  Negative COVID test.   Final Clinical Impressions(s) / UC Diagnoses   Final diagnoses:  Acute upper respiratory infection  Nasal congestion  Acute cough     Discharge Instructions      URI/COLD SYMPTOMS: Your exam today is consistent with a viral illness. Antibiotics are not indicated at this time. Use medications as directed, including cough syrup, nasal saline, and decongestants. Your symptoms should improve over the next few days and resolve within 7-10 days. Increase rest and fluids. F/u if symptoms worsen or predominate such as sore throat, ear pain, productive cough, shortness of breath, or if you develop high fevers or worsening fatigue over the next several days.       ED Prescriptions     Medication Sig Dispense Auth. Provider   brompheniramine-pseudoephedrine-DM 30-2-10 MG/5ML syrup Take 10 mLs by mouth 4 (four) times  daily as needed for up to 7 days. 150 mL Eusebio Friendly B, PA-C   ipratropium (ATROVENT) 0.06 % nasal spray Place 2 sprays into both nostrils 4 (four) times daily. 15 mL Shirlee Latch, PA-C      PDMP not reviewed this encounter.   Shirlee Latch, PA-C 05/31/23 [phone]

## 2023-05-31 NOTE — Discharge Instructions (Signed)

## 2023-06-05 ENCOUNTER — Other Ambulatory Visit: Payer: Self-pay

## 2023-06-06 ENCOUNTER — Other Ambulatory Visit: Payer: Self-pay

## 2023-06-06 MED ORDER — PREGABALIN 25 MG PO CAPS
25.0000 mg | ORAL_CAPSULE | Freq: Two times a day (BID) | ORAL | 5 refills | Status: AC
  Filled 2023-07-24: qty 60, 30d supply, fill #0
  Filled 2023-09-19 (×2): qty 60, 30d supply, fill #1
  Filled 2023-10-30: qty 60, 30d supply, fill #2
  Filled 2023-12-26: qty 60, 30d supply, fill #3

## 2023-07-05 DIAGNOSIS — Z9889 Other specified postprocedural states: Secondary | ICD-10-CM | POA: Diagnosis not present

## 2023-07-05 DIAGNOSIS — E119 Type 2 diabetes mellitus without complications: Secondary | ICD-10-CM | POA: Diagnosis not present

## 2023-07-05 DIAGNOSIS — E559 Vitamin D deficiency, unspecified: Secondary | ICD-10-CM | POA: Diagnosis not present

## 2023-07-05 DIAGNOSIS — Z9089 Acquired absence of other organs: Secondary | ICD-10-CM | POA: Diagnosis not present

## 2023-07-05 DIAGNOSIS — G932 Benign intracranial hypertension: Secondary | ICD-10-CM | POA: Diagnosis not present

## 2023-07-05 DIAGNOSIS — H2513 Age-related nuclear cataract, bilateral: Secondary | ICD-10-CM | POA: Diagnosis not present

## 2023-07-05 DIAGNOSIS — E785 Hyperlipidemia, unspecified: Secondary | ICD-10-CM | POA: Diagnosis not present

## 2023-07-05 DIAGNOSIS — H04123 Dry eye syndrome of bilateral lacrimal glands: Secondary | ICD-10-CM | POA: Diagnosis not present

## 2023-07-17 ENCOUNTER — Other Ambulatory Visit: Payer: Self-pay

## 2023-07-25 ENCOUNTER — Other Ambulatory Visit: Payer: Self-pay

## 2023-08-04 DIAGNOSIS — E559 Vitamin D deficiency, unspecified: Secondary | ICD-10-CM | POA: Diagnosis not present

## 2023-08-04 DIAGNOSIS — E785 Hyperlipidemia, unspecified: Secondary | ICD-10-CM | POA: Diagnosis not present

## 2023-08-04 DIAGNOSIS — E119 Type 2 diabetes mellitus without complications: Secondary | ICD-10-CM | POA: Diagnosis not present

## 2023-08-10 DIAGNOSIS — Z0000 Encounter for general adult medical examination without abnormal findings: Secondary | ICD-10-CM | POA: Diagnosis not present

## 2023-08-10 DIAGNOSIS — E119 Type 2 diabetes mellitus without complications: Secondary | ICD-10-CM | POA: Diagnosis not present

## 2023-08-10 DIAGNOSIS — J4520 Mild intermittent asthma, uncomplicated: Secondary | ICD-10-CM | POA: Diagnosis not present

## 2023-08-10 DIAGNOSIS — D649 Anemia, unspecified: Secondary | ICD-10-CM | POA: Diagnosis not present

## 2023-08-10 DIAGNOSIS — E559 Vitamin D deficiency, unspecified: Secondary | ICD-10-CM | POA: Diagnosis not present

## 2023-08-10 DIAGNOSIS — G4733 Obstructive sleep apnea (adult) (pediatric): Secondary | ICD-10-CM | POA: Diagnosis not present

## 2023-08-10 DIAGNOSIS — Z9884 Bariatric surgery status: Secondary | ICD-10-CM | POA: Diagnosis not present

## 2023-08-10 DIAGNOSIS — Z9889 Other specified postprocedural states: Secondary | ICD-10-CM | POA: Diagnosis not present

## 2023-08-10 DIAGNOSIS — E785 Hyperlipidemia, unspecified: Secondary | ICD-10-CM | POA: Diagnosis not present

## 2023-08-10 DIAGNOSIS — K219 Gastro-esophageal reflux disease without esophagitis: Secondary | ICD-10-CM | POA: Diagnosis not present

## 2023-08-18 ENCOUNTER — Other Ambulatory Visit: Payer: Self-pay

## 2023-08-30 ENCOUNTER — Emergency Department: Payer: Worker's Compensation

## 2023-08-30 ENCOUNTER — Emergency Department
Admission: EM | Admit: 2023-08-30 | Discharge: 2023-08-30 | Disposition: A | Discharge status: Home / Self Care | Payer: Worker's Compensation | Attending: Emergency Medicine | Admitting: Emergency Medicine

## 2023-08-30 ENCOUNTER — Other Ambulatory Visit: Payer: Self-pay

## 2023-08-30 DIAGNOSIS — W19XXXA Unspecified fall, initial encounter: Secondary | ICD-10-CM

## 2023-08-30 DIAGNOSIS — W010XXA Fall on same level from slipping, tripping and stumbling without subsequent striking against object, initial encounter: Secondary | ICD-10-CM | POA: Diagnosis not present

## 2023-08-30 DIAGNOSIS — Z043 Encounter for examination and observation following other accident: Secondary | ICD-10-CM | POA: Diagnosis not present

## 2023-08-30 DIAGNOSIS — S40012A Contusion of left shoulder, initial encounter: Secondary | ICD-10-CM | POA: Diagnosis not present

## 2023-08-30 DIAGNOSIS — M25512 Pain in left shoulder: Secondary | ICD-10-CM | POA: Diagnosis present

## 2023-08-30 NOTE — Discharge Instructions (Signed)
 Feel free to use your existing shoulder sling as needed for pain relief as you are shoulder injury heals.  You can use over-the-counter ibuprofen  and/or Tylenol  as needed according to label instructions unless another physician has told you not to use either one of those medications.  Consider using cold packs on your shoulder for the next day or 2, and then transition to heating pads.  Follow-up with one of your regular doctors as needed.

## 2023-08-30 NOTE — ED Provider Notes (Signed)
 [facility] Provider Note    Event Date/Time   First MD Initiated Contact with Patient 08/30/23 [phone]     (approximate)   History   Fall   HPI Ashley [name] is a 58 y.o. female who presents for pain in her left shoulder after a fall.  She was at work here in the emergency department and states that she slipped on a wet floor.  She tried to protect her left hip, on which she had an operation about a year ago, by placing her left arm down and she landed on it.  This caused some pain and contusion to her left shoulder.  She is able to range the arm including reaching across to her right shoulder, but she has gradually worsening pain in the shoulder and wanted checked out to make sure it was okay.  No numbness or weakness or tingling in the arm.  She is right hand dominant.     Physical Exam   ED Triage Vitals  Encounter Vitals Group     BP 08/30/23 0414 111/74     Systolic BP Percentile --      Diastolic BP Percentile --      Pulse Rate 08/30/23 0414 74     Resp 08/30/23 0414 18     Temp 08/30/23 0414 97.9 F (36.6 C)     Temp Source 08/30/23 0414 Oral     SpO2 08/30/23 0414 100 %     Weight 08/30/23 0415 83 kg (183 lb)     Height 08/30/23 0415 1.676 m (5\' 6" )     Head Circumference --      Peak Flow --      Pain Score 08/30/23 0414 1     Pain Loc --      Pain Education --      Exclude from Growth Chart --       Most recent vital signs: Vitals:   08/30/23 0414  BP: 111/74  Pulse: 74  Resp: 18  Temp: 97.9 F (36.6 C)  SpO2: 100%    General: Awake, no distress.  CV:  Good peripheral perfusion.  Resp:  Normal effort. Speaking easily and comfortably, no accessory muscle usage nor intercostal retractions.   Abd:  No distention.  Other:  Small amount of bruising on the anterior left shoulder /proximal humerus.  Tender to palpation.  No palpable deformity.  Patient has full passive and active range of motion including reaching to  the contralateral shoulder.  Well-perfused.   ED Results / Procedures / Treatments   Labs (all labs ordered are listed, but only abnormal results are displayed) Labs Reviewed - No data to display    RADIOLOGY See ED course for details   PROCEDURES:  Critical Care performed: No  Procedures    IMPRESSION / MDM / ASSESSMENT AND PLAN / ED COURSE  I reviewed the triage vital signs and the nursing notes.                              Differential diagnosis includes, but is not limited to, contusion, hematoma, fracture, dislocation.  Patient's presentation is most consistent with acute complicated illness / injury requiring diagnostic workup.  Labs/studies ordered: Left humerus x-rays  Interventions/Medications given:  Medications - No data to display  (Note:  hospital course my include additional interventions and/or labs/studies not listed above.)   Physical exam is generally reassuring.  Shoulder joint  itself has normal mobility and I am not concerned about dislocation.  No evidence of clavicular injury.  Mild proximal humerus fracture is possible and the patient is concerned about the fact that she has had multiple prior orthopedic injuries/fractures due to relatively minor falls.  We will proceed with x-rays and anticipate restricted movement with shoulder sling, RICE, etc.     Clinical Course as of 08/30/23 0440  Wed Aug 30, 2023  0439 DG Humerus Left I independently viewed and interpreted the patient's humerus x-rays and I see no evidence of fracture nor dislocation.  Existing hardware in elbow is well-appearing.  Radiology report agrees.  I updated the patient and she agrees with the plan for outpatient symptom management and follow-up.  She will use a shoulder sling that she already has as needed for pain relief but is not strictly nonweightbearing.  I gave my usual management recommendations and return precautions [CF]    Clinical Course User Index [CF]  Lynnda Sas, MD     FINAL CLINICAL IMPRESSION(S) / ED DIAGNOSES   Final diagnoses:  Fall, initial encounter  Contusion of left shoulder, initial encounter     Rx / DC Orders   ED Discharge Orders     None        Note:  This document was prepared using Dragon voice recognition software and may include unintentional dictation errors.   Lynnda Sas, MD 08/30/23 [phone]

## 2023-08-30 NOTE — ED Triage Notes (Signed)
 Patient sustained mechanical fall after tripping. She is now C/O left shoulder and left hip pain.

## 2023-09-19 ENCOUNTER — Other Ambulatory Visit: Payer: Self-pay

## 2023-09-19 MED ORDER — TIZANIDINE HCL 2 MG PO TABS
2.0000 mg | ORAL_TABLET | Freq: Three times a day (TID) | ORAL | 0 refills | Status: DC | PRN
  Filled 2023-09-19: qty 30, 10d supply, fill #0

## 2023-09-20 ENCOUNTER — Other Ambulatory Visit: Payer: Self-pay

## 2023-09-28 ENCOUNTER — Ambulatory Visit

## 2023-10-03 ENCOUNTER — Ambulatory Visit: Attending: Internal Medicine

## 2023-10-03 DIAGNOSIS — M25562 Pain in left knee: Secondary | ICD-10-CM | POA: Insufficient documentation

## 2023-10-03 DIAGNOSIS — M25512 Pain in left shoulder: Secondary | ICD-10-CM | POA: Diagnosis present

## 2023-10-03 DIAGNOSIS — M6281 Muscle weakness (generalized): Secondary | ICD-10-CM | POA: Diagnosis present

## 2023-10-03 NOTE — Therapy (Signed)
 OUTPATIENT PHYSICAL THERAPY SHOULDER/KNEE EVALUATION   Patient Name: Ashley [name] MRN: [id number] DOB:May 17, 1965, 58 y.o., female Today's Date: 10/03/2023  END OF SESSION:  PT End of Session - 10/03/23 0720     Visit Number 1    Number of Visits 16    Date for PT Re-Evaluation 11/28/23    Authorization Type 8 approved WC treatments    PT Start Time 0720    PT Stop Time 0758    PT Time Calculation (min) 38 min    Activity Tolerance Patient tolerated treatment well;Patient limited by pain    Behavior During Therapy [facility] for tasks assessed/performed          Past Medical History:  Diagnosis Date   Allergy    Anemia    Arthritis    back, left elbow, left hip, left ankle    Asthma    Blood transfusion without reported diagnosis    Complication of anesthesia    delayed emergence   Diabetes mellitus without complication (HCC)    diet controlled   Diverticulosis    GERD (gastroesophageal reflux disease)    Headache    History of hiatal hernia    History of kidney stones    Hyperparathyroidism (HCC)    Lymphedema    PONV (postoperative nausea and vomiting)    S/P subtotal parathyroidectomy    Seizure (HCC)    as a child   Sleep apnea    cannot tolerate mask   Thyroid  nodule 06/20/2018   Varicose vein of leg    Past Surgical History:  Procedure Laterality Date   ACETABULUM FRACTURE SURGERY Left    CESAREAN SECTION     CHOLECYSTECTOMY     COLONOSCOPY WITH PROPOFOL  N/A 05/31/2021   Procedure: COLONOSCOPY WITH PROPOFOL ;  Surgeon: Maryruth Ole DASEN, MD;  Location: [facility];  Service: Endoscopy;  Laterality: N/A;   ELBOW FRACTURE SURGERY  1995   ESOPHAGOGASTRODUODENOSCOPY (EGD) WITH PROPOFOL  N/A 05/31/2021   Procedure: ESOPHAGOGASTRODUODENOSCOPY (EGD) WITH PROPOFOL ;  Surgeon: Maryruth Ole DASEN, MD;  Location: [facility];  Service: Endoscopy;  Laterality: N/A;   FRACTURE SURGERY     left ankle complete repair, left elbow   HERNIA REPAIR      incisional   LAPAROSCOPIC GASTRIC SLEEVE RESECTION  2014   PARATHYROIDECTOMY N/A 10/25/2018   Procedure: PARATHYROIDECTOMY, DIABETIC, SLEEP APNEA;  Surgeon: Marolyn Nest, MD;  Location: [facility];  Service: General;  Laterality: N/A;   TONSILLECTOMY AND ADENOIDECTOMY N/A 12/08/2015   Procedure: TONSILLECTOMY AND ADENOIDECTOMY;  Surgeon: Chinita Hasten, MD;  Location: [facility] OR[facility];  Service: ENT;  Laterality: N/A;   TOTAL HIP ARTHROPLASTY Left 09/22/2022   Procedure: Left posterior total hip arthroplasty;  Surgeon: Lorelle Hussar, MD;  Location: [facility] OR[facility];  Service: Orthopedics;  Laterality: Left;   TUBAL LIGATION     Patient Active Problem List   Diagnosis Date Noted   Osteoarthritis of left hip 09/22/2022   Side pain 08/16/2022   Musculoskeletal pain 08/16/2022   Varicose veins of both legs with edema 01/05/2021   Swelling of limb 12/30/2020   Diabetes (HCC) 12/30/2020   Lymphedema 12/30/2020   S/P subtotal parathyroidectomy 11/08/2018   Hyperparathyroidism (HCC) 06/20/2018   Thyroid  nodule 06/20/2018   Intractable pain 04/12/2015    PCP:  Ophelia Sage, MD  REFERRING PROVIDER: Norleen Gavel, MD  REFERRING DIAG:  [phone] (ICD-10-CM) - Left shoulder pain  M25.562 (ICD-10-CM) - Left knee pain    THERAPY DIAG:  Acute pain of left shoulder  Muscle weakness (  generalized)  Acute pain of left knee  Rationale for Evaluation and Treatment: Rehabilitation  ONSET DATE: 08/30/23  SUBJECTIVE:                                                                                                                                                                                      SUBJECTIVE STATEMENT: Pt reports that she had L THA last year and she was worried about falling onto her hip, so she fell onto her L shoulder and knee to try to protect her hip. Pt states that she has had more difficulty with reaching behind her head due to the shoulder pain. Pt has been using Voltaren, Biofreeze, and  lidocaine  patches for pain management. Pt reports that she had a burning sensation in her knee after the fall, but now she reports that it is more soreness and swelling that bothers her rather than pain. Pt reports intermittent numbness into her L hand that goes away with movement of her L hand. Pt states that her L shoulder pain has not limited her work duties due to her being R handed and from having assistance from co-workers.  Hand dominance: Right  PERTINENT HISTORY: Pt was seen at [facility] ED on 08/30/23 after a fall sustained at work. Pt works in the emergency room and reported that she slipped on a wet spot and fell onto her left shoulder and knee to avoid falling onto her L hip. Per ED note: Physical exam is generally reassuring. Shoulder joint itself has normal mobility and I am not concerned about dislocation. No evidence of clavicular injury. Mild proximal humerus fracture is possible and the patient is concerned about the fact that she has had multiple prior orthopedic injuries/fractures due to relatively minor falls. We will proceed with x-rays and anticipate restricted movement with shoulder sling, RICE, etc. Pt underwent L THA last June and has previous history of elbow surgery that prevents full movement of L wrist. Pt reports that she wore the sling provided in ED for a couple of weeks after the fall but has not worn it since.  PMH includes anemia, arthritis (back, L elbow, L hip, L ankle), asthma, DM, GERD, headache, hyperparathyroidism, lymphedema, seizure, sleep apnea  PAIN: Reported on L shoulder: Are you having pain? Yes: NPRS scale: 3/10 currently, worst 10/10, best  Pain location: anterior shoulder Pain description: Electric shocks, sharp Aggravating factors: reaching overhead/behind head, movement Relieving factors: Voltaren, Biofreeze, lidocaine  patches, rest  PRECAUTIONS: None  RED FLAGS: None   WEIGHT BEARING RESTRICTIONS: No  FALLS:  Has patient fallen in last 6  months? Yes. Number of falls 1- pt reports that this incident is her  only fall  LIVING ENVIRONMENT: Lives with: lives with their spouse Lives in: Mobile home Stairs: Yes: External: 4 steps; can reach both Has following equipment at home: Single point cane and Walker - 2 wheeled  OCCUPATION: Works in ED at [employer]- pt reports that the pain hasn't affected her job duties that much because she is R handed  PLOF: Independent  PATIENT GOALS: Pt wants to be able to hold her 78 month old grandson, get range of motion back to use her L arm normally.  NEXT MD VISIT: Ask next session  OBJECTIVE:  Note: Objective measures were completed at Evaluation unless otherwise noted.  DIAGNOSTIC FINDINGS:  DG Humerus L (08/30/23): FINDINGS: There is no evidence of an acute fracture or dislocation. Radiopaque surgical screws and fixation wires are seen within the left olecranon process and left radial head. Soft tissues are unremarkable.   IMPRESSION: 1. No acute osseous abnormality. 2. Postoperative changes of the left elbow.  PATIENT SURVEYS:    QuickDASH Score: 68.2 / 100 = 68.2 %  LEFS: 31/80 COGNITION: Overall cognitive status: Within functional limits for tasks assessed     SENSATION: WFL- bilateral UE and LE  POSTURE: Pt holds L UE in guarded position at rest. L shoulder shifted anterior causing forward head/rounded shoulders  UPPER EXTREMITY ROM:   Active ROM Right eval Left eval  Shoulder flexion 155 100  Shoulder extension    Shoulder abduction 120 70  Shoulder adduction    Shoulder internal rotation 75 75  Shoulder external rotation 50 35  Elbow flexion    Elbow extension    Wrist flexion    Wrist extension    Wrist ulnar deviation    Wrist radial deviation    Wrist pronation    Wrist supination    (Blank rows = not tested)  UPPER EXTREMITY MMT:  MMT Right eval Left eval  Shoulder flexion 4 NT  Shoulder extension    Shoulder abduction 3+ NT  Shoulder  adduction    Shoulder internal rotation    Shoulder external rotation    Middle trapezius    Lower trapezius    Elbow flexion 4 3-  Elbow extension 4 4  Wrist flexion    Wrist extension    Wrist ulnar deviation    Wrist radial deviation    Wrist pronation    Wrist supination    Grip strength (lbs)    (Blank rows = not tested)  LOWER EXTREMITY MMT:  Assess next session   Right Left  Hip flexion    Hip Abduction    Hip Adduction    Knee Extension     Knee Flexion    DF    PF       SHOULDER SPECIAL TESTS: Impingement tests: Painful arc test: positive    PALPATION:  Painful on light palpation of anterior shoulder at greater tubercle; pt is highly sensitive to even light touch of shoulder girdle  TREATMENT DATE: 10/03/23  Evaluation only today  PATIENT EDUCATION: Education details: Pt provided education on healing timeline/inflammatory response and impact of heightened nervous system on pain response Person educated: Patient Education method: Explanation Education comprehension: verbalized understanding  HOME EXERCISE PROGRAM: HEP to be provided at next session  ASSESSMENT:  CLINICAL IMPRESSION: Patient is a pleasant 58 y.o. female who was seen today for physical therapy evaluation and treatment for L shoulder and knee pain s/p fall on 08/30/23. Examination today focused on L shoulder due to time restraints. Pt presented today with significant AROM and strength deficits due to pain in her L shoulder with increased pain reported with most active movements of her L UE. These deficits have limited her ability to perform ADLs such as grooming/bathing and other functional tasks requiring overhead movements. Pt was receptive to all education provided in-session and is highly motivated to improve her current condition. Pt would benefit from skilled PT  intervention to decrease pain, increase AROM and strength, and improve her ability to perform functional ADLs.  OBJECTIVE IMPAIRMENTS: Abnormal gait, decreased activity tolerance, decreased endurance, decreased mobility, difficulty walking, decreased ROM, decreased strength, hypomobility, increased edema, impaired perceived functional ability, impaired flexibility, impaired UE functional use, improper body mechanics, postural dysfunction, and pain.   ACTIVITY LIMITATIONS: carrying, lifting, bending, sitting, standing, squatting, stairs, transfers, bed mobility, bathing, dressing, self feeding, reach over head, hygiene/grooming, locomotion level, and caring for others  PARTICIPATION LIMITATIONS: meal prep, cleaning, laundry, driving, shopping, community activity, occupation, and yard work  PERSONAL FACTORS: Age, Fitness, Profession, and 3+ comorbidities: anemia, arthritis (back, L elbow, L hip, L ankle), asthma, DM, GERD, headache, hyperparathyroidism, lymphedema, seizure, sleep apnea are also affecting patient's functional outcome.   REHAB POTENTIAL: Good  CLINICAL DECISION MAKING: Evolving/moderate complexity  EVALUATION COMPLEXITY: Moderate   GOALS: Goals reviewed with patient? Yes  SHORT TERM GOALS: Target date: 10/24/2023   Patient will be independent in home exercise program to improve strength/mobility for better functional independence with ADLs. Baseline:HEP to be provided at next session Goal status: INITIAL    LONG TERM GOALS: Target date: 11/28/2023   Patient will decrease Quick DASH score by > 8 points demonstrating reduced self-reported upper extremity disability. Baseline: 68.2/100 Goal status: INITIAL  2.  Patient will improve LEFS score by >9 points to align with MCID value and demonstrate improved functional mobility and increased tolerance with ADLs. Baseline: 31/80 Goal status: INITIAL  3.  Patient will improve L shoulder AROM to > 140 degrees of flexion,  scaption, and abduction for improved ability to perform overhead activities. Baseline: see AROM chart above Goal status: INITIAL  4.  Patient will report a worst pain of 6/10 on VAS in L shoulder to improve tolerance with ADLs and reduced symptoms with activities. Baseline: 10/10 worst pain at eval Goal status: INITIAL  5.  Patient will be able to tolerate holding her young grandson without an increase in L shoulder pain to show improved L UE function and increase pt's ability to participate in family care. Baseline: pt unable to hold grandson at eval Goal status: INITIAL  6. Patient (< 12 years old) will complete five times sit to stand test in < 10 seconds indicating an increased LE strength and improved balance. Baseline: Assess at next session Goal status: INITIAL  PLAN:  PT FREQUENCY: 2x/week  PT DURATION: 8 weeks  PLANNED INTERVENTIONS: 97164- PT Re-evaluation, 97750- Physical Performance Testing, 97110-Therapeutic exercises, 97530- Therapeutic activity, [id number]- Neuromuscular re-education, 97535- Self Care, 02859- Manual therapy, [id number]-  Gait training, 02239- Orthotic Initial, [id number]- Orthotic/Prosthetic subsequent, [id number]- Canalith repositioning, 02239- Splinting, 02967- Electrical stimulation (manual), [id number]- Ultrasound, [id number]- Traction (mechanical), [id number]- Parrafin, 79439 (1-2 muscles), 20561 (3+ muscles)- Dry Needling, Patient/Family education, Balance training, Stair training, Taping, Joint mobilization, Spinal mobilization, Scar mobilization, Vestibular training, DME instructions, Cryotherapy, and Moist heat  PLAN FOR NEXT SESSION: Assess 5xSTS, LE strength, L meniscal testing, develop HEP, begin gentle L UE PROM/AROM, ULTT to assess numbness in L hand  Janell Axe, SPT  This entire session was performed under direct supervision and direction of a licensed Estate agent . I have personally read, edited and approve of the note as written.  Marina  Moser,  PT 10/03/2023, 10:47 AM

## 2023-10-04 NOTE — Therapy (Signed)
 OUTPATIENT PHYSICAL THERAPY SHOULDER/KNEE TREATMENT   Patient Name: Ashley [name] MRN: [id number] DOB:10/21/1965, 58 y.o., female Today's Date: 10/04/2023  END OF SESSION:    Past Medical History:  Diagnosis Date   Allergy    Anemia    Arthritis    back, left elbow, left hip, left ankle    Asthma    Blood transfusion without reported diagnosis    Complication of anesthesia    delayed emergence   Diabetes mellitus without complication (HCC)    diet controlled   Diverticulosis    GERD (gastroesophageal reflux disease)    Headache    History of hiatal hernia    History of kidney stones    Hyperparathyroidism (HCC)    Lymphedema    PONV (postoperative nausea and vomiting)    S/P subtotal parathyroidectomy    Seizure (HCC)    as a child   Sleep apnea    cannot tolerate mask   Thyroid  nodule 06/20/2018   Varicose vein of leg    Past Surgical History:  Procedure Laterality Date   ACETABULUM FRACTURE SURGERY Left    CESAREAN SECTION     CHOLECYSTECTOMY     COLONOSCOPY WITH PROPOFOL  N/A 05/31/2021   Procedure: COLONOSCOPY WITH PROPOFOL ;  Surgeon: Maryruth Ole DASEN, MD;  Location: [facility];  Service: Endoscopy;  Laterality: N/A;   ELBOW FRACTURE SURGERY  1995   ESOPHAGOGASTRODUODENOSCOPY (EGD) WITH PROPOFOL  N/A 05/31/2021   Procedure: ESOPHAGOGASTRODUODENOSCOPY (EGD) WITH PROPOFOL ;  Surgeon: Maryruth Ole DASEN, MD;  Location: [facility];  Service: Endoscopy;  Laterality: N/A;   FRACTURE SURGERY     left ankle complete repair, left elbow   HERNIA REPAIR     incisional   LAPAROSCOPIC GASTRIC SLEEVE RESECTION  2014   PARATHYROIDECTOMY N/A 10/25/2018   Procedure: PARATHYROIDECTOMY, DIABETIC, SLEEP APNEA;  Surgeon: Marolyn Nest, MD;  Location: [facility];  Service: General;  Laterality: N/A;   TONSILLECTOMY AND ADENOIDECTOMY N/A 12/08/2015   Procedure: TONSILLECTOMY AND ADENOIDECTOMY;  Surgeon: Chinita Hasten, MD;  Location: [facility];  Service:  ENT;  Laterality: N/A;   TOTAL HIP ARTHROPLASTY Left 09/22/2022   Procedure: Left posterior total hip arthroplasty;  Surgeon: Lorelle Hussar, MD;  Location: [facility];  Service: Orthopedics;  Laterality: Left;   TUBAL LIGATION     Patient Active Problem List   Diagnosis Date Noted   Osteoarthritis of left hip 09/22/2022   Side pain 08/16/2022   Musculoskeletal pain 08/16/2022   Varicose veins of both legs with edema 01/05/2021   Swelling of limb 12/30/2020   Diabetes (HCC) 12/30/2020   Lymphedema 12/30/2020   S/P subtotal parathyroidectomy 11/08/2018   Hyperparathyroidism (HCC) 06/20/2018   Thyroid  nodule 06/20/2018   Intractable pain 04/12/2015    PCP:  Ophelia Sage, MD  REFERRING PROVIDER: Norleen Gavel, MD  REFERRING DIAG:  [phone] (ICD-10-CM) - Left shoulder pain  M25.562 (ICD-10-CM) - Left knee pain    THERAPY DIAG:  No diagnosis found.  Rationale for Evaluation and Treatment: Rehabilitation  ONSET DATE: 08/30/23  SUBJECTIVE:  SUBJECTIVE STATEMENT: *** Hand dominance: Right  PERTINENT HISTORY: Pt was seen at [facility] ED on 08/30/23 after a fall sustained at work. Pt works in the emergency room and reported that she slipped on a wet spot and fell onto her left shoulder and knee to avoid falling onto her L hip. Per ED note: Physical exam is generally reassuring. Shoulder joint itself has normal mobility and I am not concerned about dislocation. No evidence of clavicular injury. Mild proximal humerus fracture is possible and the patient is concerned about the fact that she has had multiple prior orthopedic injuries/fractures due to relatively minor falls. We will proceed with x-rays and anticipate restricted movement with shoulder sling, RICE, etc. Pt underwent L THA last June and has previous history of  elbow surgery that prevents full movement of L wrist. Pt reports that she wore the sling provided in ED for a couple of weeks after the fall but has not worn it since.  PMH includes anemia, arthritis (back, L elbow, L hip, L ankle), asthma, DM, GERD, headache, hyperparathyroidism, lymphedema, seizure, sleep apnea  PAIN: Reported on L shoulder: Are you having pain? Yes: NPRS scale: 3/10 currently, worst 10/10, best  Pain location: anterior shoulder Pain description: Electric shocks, sharp Aggravating factors: reaching overhead/behind head, movement Relieving factors: Voltaren, Biofreeze, lidocaine  patches, rest  PRECAUTIONS: None  RED FLAGS: None   WEIGHT BEARING RESTRICTIONS: No  FALLS:  Has patient fallen in last 6 months? Yes. Number of falls 1- pt reports that this incident is her only fall  LIVING ENVIRONMENT: Lives with: lives with their spouse Lives in: Mobile home Stairs: Yes: External: 4 steps; can reach both Has following equipment at home: Single point cane and Walker - 2 wheeled  OCCUPATION: Works in ED at [employer]- pt reports that the pain hasn't affected her job duties that much because she is R handed  PLOF: Independent  PATIENT GOALS: Pt wants to be able to hold her 31 month old grandson, get range of motion back to use her L arm normally.  NEXT MD VISIT: ***  OBJECTIVE:  Note: Objective measures were completed at Evaluation unless otherwise noted.  DIAGNOSTIC FINDINGS:  DG Humerus L (08/30/23): FINDINGS: There is no evidence of an acute fracture or dislocation. Radiopaque surgical screws and fixation wires are seen within the left olecranon process and left radial head. Soft tissues are unremarkable.   IMPRESSION: 1. No acute osseous abnormality. 2. Postoperative changes of the left elbow.  PATIENT SURVEYS:    QuickDASH Score: 68.2 / 100 = 68.2 %  LEFS: 31/80 COGNITION: Overall cognitive status: Within functional limits for tasks  assessed     SENSATION: WFL- bilateral UE and LE  POSTURE: Pt holds L UE in guarded position at rest. L shoulder shifted anterior causing forward head/rounded shoulders  UPPER EXTREMITY ROM:   Active ROM Right eval Left eval  Shoulder flexion 155 100  Shoulder extension    Shoulder abduction 120 70  Shoulder adduction    Shoulder internal rotation 75 75  Shoulder external rotation 50 35  Elbow flexion    Elbow extension    Wrist flexion    Wrist extension    Wrist ulnar deviation    Wrist radial deviation    Wrist pronation    Wrist supination    (Blank rows = not tested)  UPPER EXTREMITY MMT:  MMT Right eval Left eval  Shoulder flexion 4 NT  Shoulder extension    Shoulder abduction 3+ NT  Shoulder  adduction    Shoulder internal rotation    Shoulder external rotation    Middle trapezius    Lower trapezius    Elbow flexion 4 3-  Elbow extension 4 4  Wrist flexion    Wrist extension    Wrist ulnar deviation    Wrist radial deviation    Wrist pronation    Wrist supination    Grip strength (lbs)    (Blank rows = not tested)  LOWER EXTREMITY MMT:  ***   Right Left  Hip flexion    Hip Abduction    Hip Adduction    Knee Extension     Knee Flexion    DF    PF       SHOULDER SPECIAL TESTS: Impingement tests: Painful arc test: positive    PALPATION:  Painful on light palpation of anterior shoulder at greater tubercle; pt is highly sensitive to even light touch of shoulder girdle                                                                                                                             TREATMENT DATE: 10/04/23  Physical Performance Measures: 5xSTS: Pt performed 5 time sit<>stand (5xSTS): *** sec (>15 sec indicates increased fall risk)    Manual: - PROM L shoulder flexion/abduction x multiple reps - GHJ A/P gentle grade 1/2 mobilization x20 seconds; 2 sets - GHJ gentle distraction mobilization x30 seconds; 2 sets  Self Care/ Home  Management: - HEP reviewed, demonstrated, and performed in session today to ensure pt able to perform accurately at home  PATIENT EDUCATION: Education details: Pt provided education on healing timeline/inflammatory response and impact of heightened nervous system on pain response Person educated: Patient Education method: Explanation Education comprehension: verbalized understanding  HOME EXERCISE PROGRAM: HEP to be provided at next session  ASSESSMENT:  CLINICAL IMPRESSION: Patient is a pleasant 58 y.o. female who was seen today for physical therapy evaluation and treatment for L shoulder and knee pain s/p fall on 08/30/23. Examination today focused on L shoulder due to time restraints. Pt presented today with significant AROM and strength deficits due to pain in her L shoulder with increased pain reported with most active movements of her L UE. These deficits have limited her ability to perform ADLs such as grooming/bathing and other functional tasks requiring overhead movements. Pt was receptive to all education provided in-session and is highly motivated to improve her current condition. Pt would benefit from skilled PT intervention to decrease pain, increase AROM and strength, and improve her ability to perform functional ADLs.  OBJECTIVE IMPAIRMENTS: Abnormal gait, decreased activity tolerance, decreased endurance, decreased mobility, difficulty walking, decreased ROM, decreased strength, hypomobility, increased edema, impaired perceived functional ability, impaired flexibility, impaired UE functional use, improper body mechanics, postural dysfunction, and pain.   ACTIVITY LIMITATIONS: carrying, lifting, bending, sitting, standing, squatting, stairs, transfers, bed mobility, bathing, dressing, self feeding, reach over head, hygiene/grooming, locomotion level, and caring for others  PARTICIPATION LIMITATIONS: meal prep, cleaning, laundry, driving, shopping, community activity, occupation, and  yard work  PERSONAL FACTORS: Age, Fitness, Profession, and 3+ comorbidities: anemia, arthritis (back, L elbow, L hip, L ankle), asthma, DM, GERD, headache, hyperparathyroidism, lymphedema, seizure, sleep apnea are also affecting patient's functional outcome.   REHAB POTENTIAL: Good  CLINICAL DECISION MAKING: Evolving/moderate complexity  EVALUATION COMPLEXITY: Moderate   GOALS: Goals reviewed with patient? Yes  SHORT TERM GOALS: Target date: 10/24/2023   Patient will be independent in home exercise program to improve strength/mobility for better functional independence with ADLs. Baseline:HEP to be provided at next session Goal status: INITIAL    LONG TERM GOALS: Target date: 11/28/2023   Patient will decrease Quick DASH score by > 8 points demonstrating reduced self-reported upper extremity disability. Baseline: 68.2/100 Goal status: INITIAL  2.  Patient will improve LEFS score by >9 points to align with MCID value and demonstrate improved functional mobility and increased tolerance with ADLs. Baseline: 31/80 Goal status: INITIAL  3.  Patient will improve L shoulder AROM to > 140 degrees of flexion, scaption, and abduction for improved ability to perform overhead activities. Baseline: see AROM chart above Goal status: INITIAL  4.  Patient will report a worst pain of 6/10 on VAS in L shoulder to improve tolerance with ADLs and reduced symptoms with activities. Baseline: 10/10 worst pain at eval Goal status: INITIAL  5.  Patient will be able to tolerate holding her young grandson without an increase in L shoulder pain to show improved L UE function and increase pt's ability to participate in family care. Baseline: pt unable to hold grandson at eval Goal status: INITIAL  6. Patient (< 33 years old) will complete five times sit to stand test in < 10 seconds indicating an increased LE strength and improved balance. Baseline: Assess at next session Goal status:  INITIAL  PLAN:  PT FREQUENCY: 2x/week  PT DURATION: 8 weeks  PLANNED INTERVENTIONS: [id number]- PT Re-evaluation, 97750- Physical Performance Testing, 97110-Therapeutic exercises, 97530- Therapeutic activity, [id number]- Neuromuscular re-education, 97535- Self Care, [id number]- Manual therapy, [id number]- Gait training, [phone]- Orthotic Initial, [id number]- Orthotic/Prosthetic subsequent, [phone]- Canalith repositioning, [id number]- Splinting, [id number]- Electrical stimulation (manual), [id number]- Ultrasound, [id number]- Traction (mechanical), [id number]- Parrafin, 20560 (1-2 muscles), [id number] (3+ muscles)- Dry Needling, Patient/Family education, Balance training, Stair training, Taping, Joint mobilization, Spinal mobilization, Scar mobilization, Vestibular training, DME instructions, Cryotherapy, and Moist heat  PLAN FOR NEXT SESSION: Assess 5xSTS, LE strength, L meniscal testing, develop HEP, begin gentle L UE PROM/AROM, ULTT to assess numbness in L hand  Janell Axe, SPT  This entire session was performed under direct supervision and direction of a licensed Estate agent . I have personally read, edited and approve of the note as written.  Herchel Hopkin, Student-PT 10/04/2023, 3:16 PM

## 2023-10-05 ENCOUNTER — Ambulatory Visit

## 2023-10-05 DIAGNOSIS — M25512 Pain in left shoulder: Secondary | ICD-10-CM

## 2023-10-05 DIAGNOSIS — M6281 Muscle weakness (generalized): Secondary | ICD-10-CM

## 2023-10-05 DIAGNOSIS — M25562 Pain in left knee: Secondary | ICD-10-CM

## 2023-10-09 ENCOUNTER — Ambulatory Visit

## 2023-10-09 DIAGNOSIS — M25512 Pain in left shoulder: Secondary | ICD-10-CM | POA: Diagnosis not present

## 2023-10-09 DIAGNOSIS — M25562 Pain in left knee: Secondary | ICD-10-CM

## 2023-10-09 DIAGNOSIS — M6281 Muscle weakness (generalized): Secondary | ICD-10-CM

## 2023-10-09 NOTE — Therapy (Signed)
 OUTPATIENT PHYSICAL THERAPY SHOULDER/KNEE TREATMENT   Patient Name: Ashley [name] MRN: [id number] DOB:1965/09/18, 58 y.o., female Today's Date: 10/09/2023  END OF SESSION:  PT End of Session - 10/09/23 0720     Visit Number 3    Number of Visits 16    Date for PT Re-Evaluation 11/28/23    Authorization Type 8 approved WC treatments    PT Start Time 0720    PT Stop Time 0759    PT Time Calculation (min) 39 min    Activity Tolerance Patient tolerated treatment well;Patient limited by pain    Behavior During Therapy [facility] for tasks assessed/performed            Past Medical History:  Diagnosis Date   Allergy    Anemia    Arthritis    back, left elbow, left hip, left ankle    Asthma    Blood transfusion without reported diagnosis    Complication of anesthesia    delayed emergence   Diabetes mellitus without complication (HCC)    diet controlled   Diverticulosis    GERD (gastroesophageal reflux disease)    Headache    History of hiatal hernia    History of kidney stones    Hyperparathyroidism (HCC)    Lymphedema    PONV (postoperative nausea and vomiting)    S/P subtotal parathyroidectomy    Seizure (HCC)    as a child   Sleep apnea    cannot tolerate mask   Thyroid  nodule 06/20/2018   Varicose vein of leg    Past Surgical History:  Procedure Laterality Date   ACETABULUM FRACTURE SURGERY Left    CESAREAN SECTION     CHOLECYSTECTOMY     COLONOSCOPY WITH PROPOFOL  N/A 05/31/2021   Procedure: COLONOSCOPY WITH PROPOFOL ;  Surgeon: Maryruth Ole DASEN, MD;  Location: [facility];  Service: Endoscopy;  Laterality: N/A;   ELBOW FRACTURE SURGERY  1995   ESOPHAGOGASTRODUODENOSCOPY (EGD) WITH PROPOFOL  N/A 05/31/2021   Procedure: ESOPHAGOGASTRODUODENOSCOPY (EGD) WITH PROPOFOL ;  Surgeon: Maryruth Ole DASEN, MD;  Location: [facility] END[facility];  Service: Endoscopy;  Laterality: N/A;   FRACTURE SURGERY     left ankle complete repair, left elbow   HERNIA REPAIR      incisional   LAPAROSCOPIC GASTRIC SLEEVE RESECTION  2014   PARATHYROIDECTOMY N/A 10/25/2018   Procedure: PARATHYROIDECTOMY, DIABETIC, SLEEP APNEA;  Surgeon: Marolyn Nest, MD;  Location: [facility] OR[facility];  Service: General;  Laterality: N/A;   TONSILLECTOMY AND ADENOIDECTOMY N/A 12/08/2015   Procedure: TONSILLECTOMY AND ADENOIDECTOMY;  Surgeon: Chinita Hasten, MD;  Location: [facility] OR[facility];  Service: ENT;  Laterality: N/A;   TOTAL HIP ARTHROPLASTY Left 09/22/2022   Procedure: Left posterior total hip arthroplasty;  Surgeon: Lorelle Hussar, MD;  Location: [facility] OR[facility];  Service: Orthopedics;  Laterality: Left;   TUBAL LIGATION     Patient Active Problem List   Diagnosis Date Noted   Osteoarthritis of left hip 09/22/2022   Side pain 08/16/2022   Musculoskeletal pain 08/16/2022   Varicose veins of both legs with edema 01/05/2021   Swelling of limb 12/30/2020   Diabetes (HCC) 12/30/2020   Lymphedema 12/30/2020   S/P subtotal parathyroidectomy 11/08/2018   Hyperparathyroidism (HCC) 06/20/2018   Thyroid  nodule 06/20/2018   Intractable pain 04/12/2015    PCP:  Ophelia Sage, MD  REFERRING PROVIDER: Norleen Gavel, MD  REFERRING DIAG:  [phone] (ICD-10-CM) - Left shoulder pain  M25.562 (ICD-10-CM) - Left knee pain    THERAPY DIAG:  Muscle weakness (generalized)  Acute pain  of left knee  Acute pain of left shoulder  Rationale for Evaluation and Treatment: Rehabilitation  ONSET DATE: 08/30/23  SUBJECTIVE:                                                                                                                                                                                      SUBJECTIVE STATEMENT: Patient reports she hasn't heard from physicians yet. Reports her pain started to worsen over her shift last night; now a 4/10.  Hand dominance: Right  PERTINENT HISTORY: Pt was seen at [facility] ED on 08/30/23 after a fall sustained at work. Pt works in the emergency room and reported that she  slipped on a wet spot and fell onto her left shoulder and knee to avoid falling onto her L hip. Per ED note: Physical exam is generally reassuring. Shoulder joint itself has normal mobility and I am not concerned about dislocation. No evidence of clavicular injury. Mild proximal humerus fracture is possible and the patient is concerned about the fact that she has had multiple prior orthopedic injuries/fractures due to relatively minor falls. We will proceed with x-rays and anticipate restricted movement with shoulder sling, RICE, etc. Pt underwent L THA last June and has previous history of elbow surgery that prevents full movement of L wrist. Pt reports that she wore the sling provided in ED for a couple of weeks after the fall but has not worn it since.  PMH includes anemia, arthritis (back, L elbow, L hip, L ankle), asthma, DM, GERD, headache, hyperparathyroidism, lymphedema, seizure, sleep apnea  PAIN: Reported on L shoulder: Are you having pain? Yes: NPRS scale: 3/10 currently, worst 10/10, best  Pain location: anterior shoulder Pain description: Electric shocks, sharp Aggravating factors: reaching overhead/behind head, movement Relieving factors: Voltaren, Biofreeze, lidocaine  patches, rest  PRECAUTIONS: None  RED FLAGS: None   WEIGHT BEARING RESTRICTIONS: No  FALLS:  Has patient fallen in last 6 months? Yes. Number of falls 1- pt reports that this incident is her only fall  LIVING ENVIRONMENT: Lives with: lives with their spouse Lives in: Mobile home Stairs: Yes: External: 4 steps; can reach both Has following equipment at home: Single point cane and Walker - 2 wheeled  OCCUPATION: Works in ED at [employer]- pt reports that the pain hasn't affected her job duties that much because she is R handed  PLOF: Independent  PATIENT GOALS: Pt wants to be able to hold her 76 month old grandson, get range of motion back to use her L arm normally.  NEXT MD VISIT: 10/12/23  OBJECTIVE:   Note: Objective measures were completed at Evaluation unless otherwise noted.  DIAGNOSTIC FINDINGS:  DG  Humerus L (08/30/23): FINDINGS: There is no evidence of an acute fracture or dislocation. Radiopaque surgical screws and fixation wires are seen within the left olecranon process and left radial head. Soft tissues are unremarkable.   IMPRESSION: 1. No acute osseous abnormality. 2. Postoperative changes of the left elbow.  PATIENT SURVEYS:    QuickDASH Score: 68.2 / 100 = 68.2 %  LEFS: 31/80 COGNITION: Overall cognitive status: Within functional limits for tasks assessed     SENSATION: WFL- bilateral UE and LE  POSTURE: Pt holds L UE in guarded position at rest. L shoulder shifted anterior causing forward head/rounded shoulders  UPPER EXTREMITY ROM:   Active ROM Right eval Left eval  Shoulder flexion 155 100  Shoulder extension    Shoulder abduction 120 70  Shoulder adduction    Shoulder internal rotation 75 75  Shoulder external rotation 50 35  Elbow flexion    Elbow extension    Wrist flexion    Wrist extension    Wrist ulnar deviation    Wrist radial deviation    Wrist pronation    Wrist supination    (Blank rows = not tested)  UPPER EXTREMITY MMT:  MMT Right eval Left eval  Shoulder flexion 4 NT  Shoulder extension    Shoulder abduction 3+ NT  Shoulder adduction    Shoulder internal rotation    Shoulder external rotation    Middle trapezius    Lower trapezius    Elbow flexion 4 3-  Elbow extension 4 4  Wrist flexion    Wrist extension    Wrist ulnar deviation    Wrist radial deviation    Wrist pronation    Wrist supination    Grip strength (lbs)    (Blank rows = not tested)  LOWER EXTREMITY MMT:     Right Left  Hip flexion 4- 4-  Hip Abduction 4+ 4+  Hip Adduction 4+ 4+  Knee Extension  3+ 3+*  Knee Flexion 4+ 4  DF 4+ 4  PF 5 5     SHOULDER SPECIAL TESTS: Impingement tests: Painful arc test: positive    PALPATION:   Painful on light palpation of anterior shoulder at greater tubercle; pt is highly sensitive to even light touch of shoulder girdle                                                                                                                             TREATMENT DATE: 10/09/23  Manual: L AC joint mobilization 30 seconds x 5 trials  Bicep L with movement and lengthening trigger point release x multiple minutes Upper trap stretch 30 seconds  Ther-ex: -shoulder PROM all directions, multiple reps hold 10 seconds -isometric supine position: 10x 5 second holds; cues for 40-60% muscle activation for pain reduction technique: flexion, abduction, adduction, extension.   L quad set 10x; 5 second holds; x 2 sets Scapular retractions 15x   PATIENT EDUCATION: Education details: Pt provided education on  healing timeline/inflammatory response and impact of heightened nervous system on pain response Person educated: Patient Education method: Explanation Education comprehension: verbalized understanding  HOME EXERCISE PROGRAM: Access Code: [email or web address]  URL: [email or web address]  Date: 10/05/2023  Prepared by: Tawnie Ehresman  Exercises  - Standing shoulder flexion wall slides  - 1 x daily - 7 x weekly - 2 sets - 10 reps - 5 hold  - Standing Shoulder Abduction Slides at Wall  - 1 x daily - 7 x weekly - 2 sets - 10 reps - 5 hold  - Standing Isometric Shoulder Flexion with Doorway - Arm Bent  - 1 x daily - 7 x weekly - 2 sets - 10 reps - 5 hold  - Standing Isometric Shoulder Abduction with Doorway - Arm Bent  - 1 x daily - 7 x weekly - 2 sets - 10 reps - 5 hold  - Standing Isometric Shoulder Internal Rotation at Doorway  - 1 x daily - 7 x weekly - 2 sets - 10 reps - 5 hold  - Standing Isometric Shoulder Extension with Doorway - Arm Bent  - 1 x daily - 7 x weekly - 2 sets - 10 reps - 5 hold  - Seated Shoulder Pendulum Exercise  - 1 x daily - 7 x weekly - 2 sets - 10 reps - 5 hold    ASSESSMENT:  CLINICAL IMPRESSION: Still awaiting response from physician in regards to need for follow up imaging. Patient is highly motivated for pain reduction strategies. Patient is compliant with HEP and verbalized understanding of program. Her AC joint continues to be an area of concern that will need follow up. Pt would benefit from skilled PT intervention to decrease pain, increase AROM and strength, and improve her ability to perform functional ADLs.  OBJECTIVE IMPAIRMENTS: Abnormal gait, decreased activity tolerance, decreased endurance, decreased mobility, difficulty walking, decreased ROM, decreased strength, hypomobility, increased edema, impaired perceived functional ability, impaired flexibility, impaired UE functional use, improper body mechanics, postural dysfunction, and pain.   ACTIVITY LIMITATIONS: carrying, lifting, bending, sitting, standing, squatting, stairs, transfers, bed mobility, bathing, dressing, self feeding, reach over head, hygiene/grooming, locomotion level, and caring for others  PARTICIPATION LIMITATIONS: meal prep, cleaning, laundry, driving, shopping, community activity, occupation, and yard work  PERSONAL FACTORS: Age, Fitness, Profession, and 3+ comorbidities: anemia, arthritis (back, L elbow, L hip, L ankle), asthma, DM, GERD, headache, hyperparathyroidism, lymphedema, seizure, sleep apnea are also affecting patient's functional outcome.   REHAB POTENTIAL: Good  CLINICAL DECISION MAKING: Evolving/moderate complexity  EVALUATION COMPLEXITY: Moderate   GOALS: Goals reviewed with patient? Yes  SHORT TERM GOALS: Target date: 10/24/2023   Patient will be independent in home exercise program to improve strength/mobility for better functional independence with ADLs. Baseline:See HEP above Goal status: INITIAL    LONG TERM GOALS: Target date: 11/28/2023   Patient will decrease Quick DASH score by > 8 points demonstrating reduced self-reported upper  extremity disability. Baseline: 68.2/100 Goal status: INITIAL  2.  Patient will improve LEFS score by >9 points to align with MCID value and demonstrate improved functional mobility and increased tolerance with ADLs. Baseline: 31/80 Goal status: INITIAL  3.  Patient will improve L shoulder AROM to > 140 degrees of flexion, scaption, and abduction for improved ability to perform overhead activities. Baseline: see AROM chart above Goal status: INITIAL  4.  Patient will report a worst pain of 6/10 on VAS in L shoulder to improve tolerance with ADLs and reduced symptoms with activities. Baseline: 10/10  worst pain at eval Goal status: INITIAL  5.  Patient will be able to tolerate holding her young grandson without an increase in L shoulder pain to show improved L UE function and increase pt's ability to participate in family care. Baseline: pt unable to hold grandson at eval Goal status: INITIAL  6. Patient (< 22 years old) will complete five times sit to stand test in < 10 seconds indicating an increased LE strength and improved balance. Baseline: 20.56 seconds on 7/10 Goal status: INITIAL  PLAN:  PT FREQUENCY: 2x/week  PT DURATION: 8 weeks  PLANNED INTERVENTIONS: 97164- PT Re-evaluation, 97750- Physical Performance Testing, 97110-Therapeutic exercises, 97530- Therapeutic activity, [id number]- Neuromuscular re-education, 97535- Self Care, [id number]- Manual therapy, [id number]- Gait training, [id number]- Orthotic Initial, [id number]- Orthotic/Prosthetic subsequent, [phone]- Canalith repositioning, [id number]- Splinting, [id number]- Electrical stimulation (manual), [id number]- Ultrasound, [id number]- Traction (mechanical), [id number]- Parrafin, 20560 (1-2 muscles), 20561 (3+ muscles)- Dry Needling, Patient/Family education, Balance training, Stair training, Taping, Joint mobilization, Spinal mobilization, Scar mobilization, Vestibular training, DME instructions, Cryotherapy, and Moist heat  PLAN FOR NEXT SESSION: Progress L UE AROM/PROM, L  knee meniscal testing, LE strengthening exercises, gentle L shoulder mobilizations    Sharalee Witman, PT 10/09/2023, 9:40 AM

## 2023-10-11 ENCOUNTER — Ambulatory Visit

## 2023-10-12 ENCOUNTER — Other Ambulatory Visit: Payer: Self-pay

## 2023-10-12 MED ORDER — TIZANIDINE HCL 2 MG PO TABS
2.0000 mg | ORAL_TABLET | Freq: Three times a day (TID) | ORAL | 0 refills | Status: DC | PRN
  Filled 2023-10-12: qty 30, 10d supply, fill #0

## 2023-10-13 ENCOUNTER — Other Ambulatory Visit: Payer: Self-pay | Admitting: Orthopedic Surgery

## 2023-10-13 DIAGNOSIS — M25512 Pain in left shoulder: Secondary | ICD-10-CM

## 2023-10-13 DIAGNOSIS — M25562 Pain in left knee: Secondary | ICD-10-CM

## 2023-10-17 ENCOUNTER — Ambulatory Visit

## 2023-10-17 DIAGNOSIS — M25512 Pain in left shoulder: Secondary | ICD-10-CM | POA: Diagnosis not present

## 2023-10-17 DIAGNOSIS — M25562 Pain in left knee: Secondary | ICD-10-CM

## 2023-10-17 DIAGNOSIS — M6281 Muscle weakness (generalized): Secondary | ICD-10-CM

## 2023-10-17 NOTE — Therapy (Signed)
 OUTPATIENT PHYSICAL THERAPY SHOULDER/KNEE TREATMENT   Patient Name: Ashley [name] MRN: [id number] DOB:[birth year], 58 y.o., female Today's Date: 10/17/2023  END OF SESSION:  PT End of Session - 10/17/23 0718     Visit Number 4    Number of Visits 16    Date for PT Re-Evaluation 11/28/23    Authorization Type 8 approved WC treatments    PT Start Time 0718    PT Stop Time 0756    PT Time Calculation (min) 38 min    Activity Tolerance Patient tolerated treatment well;Patient limited by pain    Behavior During Therapy [facility] for tasks assessed/performed             Past Medical History:  Diagnosis Date   Allergy    Anemia    Arthritis    back, left elbow, left hip, left ankle    Asthma    Blood transfusion without reported diagnosis    Complication of anesthesia    delayed emergence   Diabetes mellitus without complication (HCC)    diet controlled   Diverticulosis    GERD (gastroesophageal reflux disease)    Headache    History of hiatal hernia    History of kidney stones    Hyperparathyroidism (HCC)    Lymphedema    PONV (postoperative nausea and vomiting)    S/P subtotal parathyroidectomy    Seizure (HCC)    as a child   Sleep apnea    cannot tolerate mask   Thyroid  nodule 06/20/2018   Varicose vein of leg    Past Surgical History:  Procedure Laterality Date   ACETABULUM FRACTURE SURGERY Left    CESAREAN SECTION     CHOLECYSTECTOMY     COLONOSCOPY WITH PROPOFOL  N/A 05/31/2021   Procedure: COLONOSCOPY WITH PROPOFOL ;  Surgeon: Maryruth Ole DASEN, MD;  Location: [facility];  Service: Endoscopy;  Laterality: N/A;   ELBOW FRACTURE SURGERY  1995   ESOPHAGOGASTRODUODENOSCOPY (EGD) WITH PROPOFOL  N/A 05/31/2021   Procedure: ESOPHAGOGASTRODUODENOSCOPY (EGD) WITH PROPOFOL ;  Surgeon: Maryruth Ole DASEN, MD;  Location: [facility];  Service: Endoscopy;  Laterality: N/A;   FRACTURE SURGERY     left ankle complete repair, left elbow   HERNIA REPAIR      incisional   LAPAROSCOPIC GASTRIC SLEEVE RESECTION  2014   PARATHYROIDECTOMY N/A 10/25/2018   Procedure: PARATHYROIDECTOMY, DIABETIC, SLEEP APNEA;  Surgeon: Marolyn Nest, MD;  Location: [facility] ORS;  Service: General;  Laterality: N/A;   TONSILLECTOMY AND ADENOIDECTOMY N/A 12/08/2015   Procedure: TONSILLECTOMY AND ADENOIDECTOMY;  Surgeon: Chinita Hasten, MD;  Location: [facility] ORS;  Service: ENT;  Laterality: N/A;   TOTAL HIP ARTHROPLASTY Left 09/22/2022   Procedure: Left posterior total hip arthroplasty;  Surgeon: Lorelle Hussar, MD;  Location: [facility] OR[facility];  Service: Orthopedics;  Laterality: Left;   TUBAL LIGATION     Patient Active Problem List   Diagnosis Date Noted   Osteoarthritis of left hip 09/22/2022   Side pain 08/16/2022   Musculoskeletal pain 08/16/2022   Varicose veins of both legs with edema 01/05/2021   Swelling of limb 12/30/2020   Diabetes (HCC) 12/30/2020   Lymphedema 12/30/2020   S/P subtotal parathyroidectomy 11/08/2018   Hyperparathyroidism (HCC) 06/20/2018   Thyroid  nodule 06/20/2018   Intractable pain 04/12/2015    PCP:  Ophelia Sage, MD  REFERRING PROVIDER: Norleen Gavel, MD  REFERRING DIAG:  [phone] (ICD-10-CM) - Left shoulder pain  M25.562 (ICD-10-CM) - Left knee pain    THERAPY DIAG:  Muscle weakness (generalized)  Acute  pain of left knee  Acute pain of left shoulder  Rationale for Evaluation and Treatment: Rehabilitation  ONSET DATE: 08/30/23  SUBJECTIVE:                                                                                                                                                                                      SUBJECTIVE STATEMENT: Pt reports that she has an MRI scheduled for Friday. Pt states that she is more achy today because of the rain. Hand dominance: Right  PERTINENT HISTORY: Pt was seen at [facility] ED on 08/30/23 after a fall sustained at work. Pt works in the emergency room and reported that she slipped on a wet spot  and fell onto her left shoulder and knee to avoid falling onto her L hip. Per ED note: Physical exam is generally reassuring. Shoulder joint itself has normal mobility and I am not concerned about dislocation. No evidence of clavicular injury. Mild proximal humerus fracture is possible and the patient is concerned about the fact that she has had multiple prior orthopedic injuries/fractures due to relatively minor falls. We will proceed with x-rays and anticipate restricted movement with shoulder sling, RICE, etc. Pt underwent L THA last June and has previous history of elbow surgery that prevents full movement of L wrist. Pt reports that she wore the sling provided in ED for a couple of weeks after the fall but has not worn it since.  PMH includes anemia, arthritis (back, L elbow, L hip, L ankle), asthma, DM, GERD, headache, hyperparathyroidism, lymphedema, seizure, sleep apnea  PAIN: Reported on L shoulder: Are you having pain? Yes: NPRS scale: 3/10 currently, worst 10/10, best  Pain location: anterior shoulder Pain description: Electric shocks, sharp Aggravating factors: reaching overhead/behind head, movement Relieving factors: Voltaren, Biofreeze, lidocaine  patches, rest  PRECAUTIONS: None  RED FLAGS: None   WEIGHT BEARING RESTRICTIONS: No  FALLS:  Has patient fallen in last 6 months? Yes. Number of falls 1- pt reports that this incident is her only fall  LIVING ENVIRONMENT: Lives with: lives with their spouse Lives in: Mobile home Stairs: Yes: External: 4 steps; can reach both Has following equipment at home: Single point cane and Walker - 2 wheeled  OCCUPATION: Works in ED at [employer]- pt reports that the pain hasn't affected her job duties that much because she is R handed  PLOF: Independent  PATIENT GOALS: Pt wants to be able to hold her 36 month old grandson, get range of motion back to use her L arm normally.  NEXT MD VISIT: 10/12/23  OBJECTIVE:  Note: Objective measures  were completed at Evaluation unless otherwise noted.  DIAGNOSTIC FINDINGS:  DG  Humerus L (08/30/23): FINDINGS: There is no evidence of an acute fracture or dislocation. Radiopaque surgical screws and fixation wires are seen within the left olecranon process and left radial head. Soft tissues are unremarkable.   IMPRESSION: 1. No acute osseous abnormality. 2. Postoperative changes of the left elbow.  PATIENT SURVEYS:    QuickDASH Score: 68.2 / 100 = 68.2 %  LEFS: 31/80 COGNITION: Overall cognitive status: Within functional limits for tasks assessed     SENSATION: WFL- bilateral UE and LE  POSTURE: Pt holds L UE in guarded position at rest. L shoulder shifted anterior causing forward head/rounded shoulders  UPPER EXTREMITY ROM:   Active ROM Right eval Left eval  Shoulder flexion 155 100  Shoulder extension    Shoulder abduction 120 70  Shoulder adduction    Shoulder internal rotation 75 75  Shoulder external rotation 50 35  Elbow flexion    Elbow extension    Wrist flexion    Wrist extension    Wrist ulnar deviation    Wrist radial deviation    Wrist pronation    Wrist supination    (Blank rows = not tested)  UPPER EXTREMITY MMT:  MMT Right eval Left eval  Shoulder flexion 4 NT  Shoulder extension    Shoulder abduction 3+ NT  Shoulder adduction    Shoulder internal rotation    Shoulder external rotation    Middle trapezius    Lower trapezius    Elbow flexion 4 3-  Elbow extension 4 4  Wrist flexion    Wrist extension    Wrist ulnar deviation    Wrist radial deviation    Wrist pronation    Wrist supination    Grip strength (lbs)    (Blank rows = not tested)  LOWER EXTREMITY MMT:     Right Left  Hip flexion 4- 4-  Hip Abduction 4+ 4+  Hip Adduction 4+ 4+  Knee Extension  3+ 3+*  Knee Flexion 4+ 4  DF 4+ 4  PF 5 5     SHOULDER SPECIAL TESTS: Impingement tests: Painful arc test: positive    PALPATION:  Painful on light palpation of  anterior shoulder at greater tubercle; pt is highly sensitive to even light touch of shoulder girdle                                                                                                                             TREATMENT DATE: 10/17/23  Manual: Upper trap stretch 30 seconds; 2 rounds each direction- GHJ depression on R  Suboccipital release x45 seconds Bicep L with movement and lengthening trigger point release x multiple minutes Gentle grade I-II L GHJ distraction 2x30 seconds  Ther-ex: -shoulder PROM all directions, multiple reps hold 10 seconds -isometric supine position: 10x 5 second holds- flexion, abduction, extension.   SAQ over bolster x15 BLE Scapular retractions 15x  Shoulder shrugs x15  PATIENT EDUCATION: Education details: Pt provided education on healing  timeline/inflammatory response and impact of heightened nervous system on pain response Person educated: Patient Education method: Explanation Education comprehension: verbalized understanding  HOME EXERCISE PROGRAM: Access Code: [email or web address]  URL: [email or web address]  Date: 10/05/2023  Prepared by: Marina  Moser  Exercises  - Standing shoulder flexion wall slides  - 1 x daily - 7 x weekly - 2 sets - 10 reps - 5 hold  - Standing Shoulder Abduction Slides at Wall  - 1 x daily - 7 x weekly - 2 sets - 10 reps - 5 hold  - Standing Isometric Shoulder Flexion with Doorway - Arm Bent  - 1 x daily - 7 x weekly - 2 sets - 10 reps - 5 hold  - Standing Isometric Shoulder Abduction with Doorway - Arm Bent  - 1 x daily - 7 x weekly - 2 sets - 10 reps - 5 hold  - Standing Isometric Shoulder Internal Rotation at Doorway  - 1 x daily - 7 x weekly - 2 sets - 10 reps - 5 hold  - Standing Isometric Shoulder Extension with Doorway - Arm Bent  - 1 x daily - 7 x weekly - 2 sets - 10 reps - 5 hold  - Seated Shoulder Pendulum Exercise  - 1 x daily - 7 x weekly - 2 sets - 10 reps - 5 hold   ASSESSMENT:  CLINICAL  IMPRESSION: Pt has MRIs scheduled for this coming Friday, will follow-up with results. Pt continues to have difficulty and pain with most active ranges, especially when returning to neutral from flexion/abduction. Pt remains highly motivated to reduce her pain and improve her shoulder motion/strength. Pt would benefit from skilled PT intervention to decrease pain, increase AROM and strength, and improve her ability to perform functional ADLs.  OBJECTIVE IMPAIRMENTS: Abnormal gait, decreased activity tolerance, decreased endurance, decreased mobility, difficulty walking, decreased ROM, decreased strength, hypomobility, increased edema, impaired perceived functional ability, impaired flexibility, impaired UE functional use, improper body mechanics, postural dysfunction, and pain.   ACTIVITY LIMITATIONS: carrying, lifting, bending, sitting, standing, squatting, stairs, transfers, bed mobility, bathing, dressing, self feeding, reach over head, hygiene/grooming, locomotion level, and caring for others  PARTICIPATION LIMITATIONS: meal prep, cleaning, laundry, driving, shopping, community activity, occupation, and yard work  PERSONAL FACTORS: Age, Fitness, Profession, and 3+ comorbidities: anemia, arthritis (back, L elbow, L hip, L ankle), asthma, DM, GERD, headache, hyperparathyroidism, lymphedema, seizure, sleep apnea are also affecting patient's functional outcome.   REHAB POTENTIAL: Good  CLINICAL DECISION MAKING: Evolving/moderate complexity  EVALUATION COMPLEXITY: Moderate   GOALS: Goals reviewed with patient? Yes  SHORT TERM GOALS: Target date: 10/24/2023   Patient will be independent in home exercise program to improve strength/mobility for better functional independence with ADLs. Baseline:See HEP above Goal status: INITIAL    LONG TERM GOALS: Target date: 11/28/2023   Patient will decrease Quick DASH score by > 8 points demonstrating reduced self-reported upper extremity  disability. Baseline: 68.2/100 Goal status: INITIAL  2.  Patient will improve LEFS score by >9 points to align with MCID value and demonstrate improved functional mobility and increased tolerance with ADLs. Baseline: 31/80 Goal status: INITIAL  3.  Patient will improve L shoulder AROM to > 140 degrees of flexion, scaption, and abduction for improved ability to perform overhead activities. Baseline: see AROM chart above Goal status: INITIAL  4.  Patient will report a worst pain of 6/10 on VAS in L shoulder to improve tolerance with ADLs and reduced symptoms with activities. Baseline: 10/10 worst pain at eval  Goal status: INITIAL  5.  Patient will be able to tolerate holding her young grandson without an increase in L shoulder pain to show improved L UE function and increase pt's ability to participate in family care. Baseline: pt unable to hold grandson at eval Goal status: INITIAL  6. Patient (< 45 years old) will complete five times sit to stand test in < 10 seconds indicating an increased LE strength and improved balance. Baseline: 20.56 seconds on 7/10 Goal status: INITIAL  PLAN:  PT FREQUENCY: 2x/week  PT DURATION: 8 weeks  PLANNED INTERVENTIONS: 97164- PT Re-evaluation, 97750- Physical Performance Testing, 97110-Therapeutic exercises, 97530- Therapeutic activity, [id number]- Neuromuscular re-education, 97535- Self Care, [id number]- Manual therapy, [id number]- Gait training, [id number]- Orthotic Initial, [id number]- Orthotic/Prosthetic subsequent, [phone]- Canalith repositioning, [id number]- Splinting, [id number]- Electrical stimulation (manual), [id number]- Ultrasound, [id number]- Traction (mechanical), [id number]- Parrafin, 20560 (1-2 muscles), 20561 (3+ muscles)- Dry Needling, Patient/Family education, Balance training, Stair training, Taping, Joint mobilization, Spinal mobilization, Scar mobilization, Vestibular training, DME instructions, Cryotherapy, and Moist heat  PLAN FOR NEXT SESSION: Progress L UE AROM/PROM, L knee  meniscal testing, LE strengthening exercises, gentle L shoulder mobilizations  Pancho Rushing, SPT  This entire session was performed under direct supervision and direction of a licensed Estate agent . I have personally read, edited and approve of the note as written.  Marina  Leopoldo KLEIN, DPT Physical Therapist - [facility]  Outpatient Physical Therapy- Main Campus [phone]    10/17/2023, 11:20 AM

## 2023-10-18 ENCOUNTER — Ambulatory Visit

## 2023-10-18 DIAGNOSIS — M25512 Pain in left shoulder: Secondary | ICD-10-CM | POA: Diagnosis not present

## 2023-10-18 DIAGNOSIS — M25562 Pain in left knee: Secondary | ICD-10-CM

## 2023-10-18 DIAGNOSIS — M6281 Muscle weakness (generalized): Secondary | ICD-10-CM

## 2023-10-18 NOTE — Therapy (Signed)
 OUTPATIENT PHYSICAL THERAPY SHOULDER/KNEE TREATMENT   Patient Name: Ashley [name] MRN: [id number] DOB:March 10, 1966, 58 y.o., female Today's Date: 10/18/2023  END OF SESSION:  PT End of Session - 10/18/23 0804     Visit Number 5    Number of Visits 16    Date for PT Re-Evaluation 11/28/23    Authorization Type 8 approved WC treatments    PT Start Time 0802    PT Stop Time 0844    PT Time Calculation (min) 42 min    Activity Tolerance Patient tolerated treatment well;Patient limited by pain    Behavior During Therapy [facility] for tasks assessed/performed             Past Medical History:  Diagnosis Date   Allergy    Anemia    Arthritis    back, left elbow, left hip, left ankle    Asthma    Blood transfusion without reported diagnosis    Complication of anesthesia    delayed emergence   Diabetes mellitus without complication (HCC)    diet controlled   Diverticulosis    GERD (gastroesophageal reflux disease)    Headache    History of hiatal hernia    History of kidney stones    Hyperparathyroidism (HCC)    Lymphedema    PONV (postoperative nausea and vomiting)    S/P subtotal parathyroidectomy    Seizure (HCC)    as a child   Sleep apnea    cannot tolerate mask   Thyroid  nodule 06/20/2018   Varicose vein of leg    Past Surgical History:  Procedure Laterality Date   ACETABULUM FRACTURE SURGERY Left    CESAREAN SECTION     CHOLECYSTECTOMY     COLONOSCOPY WITH PROPOFOL  N/A 05/31/2021   Procedure: COLONOSCOPY WITH PROPOFOL ;  Surgeon: Maryruth Ole DASEN, MD;  Location: [facility];  Service: Endoscopy;  Laterality: N/A;   ELBOW FRACTURE SURGERY  1995   ESOPHAGOGASTRODUODENOSCOPY (EGD) WITH PROPOFOL  N/A 05/31/2021   Procedure: ESOPHAGOGASTRODUODENOSCOPY (EGD) WITH PROPOFOL ;  Surgeon: Maryruth Ole DASEN, MD;  Location: [facility];  Service: Endoscopy;  Laterality: N/A;   FRACTURE SURGERY     left ankle complete repair, left elbow   HERNIA REPAIR      incisional   LAPAROSCOPIC GASTRIC SLEEVE RESECTION  2014   PARATHYROIDECTOMY N/A 10/25/2018   Procedure: PARATHYROIDECTOMY, DIABETIC, SLEEP APNEA;  Surgeon: Marolyn Nest, MD;  Location: [facility] OR[facility];  Service: General;  Laterality: N/A;   TONSILLECTOMY AND ADENOIDECTOMY N/A 12/08/2015   Procedure: TONSILLECTOMY AND ADENOIDECTOMY;  Surgeon: Chinita Hasten, MD;  Location: [facility];  Service: ENT;  Laterality: N/A;   TOTAL HIP ARTHROPLASTY Left 09/22/2022   Procedure: Left posterior total hip arthroplasty;  Surgeon: Lorelle Hussar, MD;  Location: [facility];  Service: Orthopedics;  Laterality: Left;   TUBAL LIGATION     Patient Active Problem List   Diagnosis Date Noted   Osteoarthritis of left hip 09/22/2022   Side pain 08/16/2022   Musculoskeletal pain 08/16/2022   Varicose veins of both legs with edema 01/05/2021   Swelling of limb 12/30/2020   Diabetes (HCC) 12/30/2020   Lymphedema 12/30/2020   S/P subtotal parathyroidectomy 11/08/2018   Hyperparathyroidism (HCC) 06/20/2018   Thyroid  nodule 06/20/2018   Intractable pain 04/12/2015    PCP:  Ophelia Sage, MD  REFERRING PROVIDER: Norleen Gavel, MD  REFERRING DIAG:  [phone] (ICD-10-CM) - Left shoulder pain  M25.562 (ICD-10-CM) - Left knee pain    THERAPY DIAG:  Muscle weakness (generalized)  Acute  pain of left knee  Acute pain of left shoulder  Rationale for Evaluation and Treatment: Rehabilitation  ONSET DATE: 08/30/23  SUBJECTIVE:                                                                                                                                                                                      SUBJECTIVE STATEMENT: Pt reports ongoing left shoulder and left knee pain. States her pain tolerance is fairly high and that she is moving her knee and arm as best she cane. States going to have an MRI scheduled for Friday.   Hand dominance: Right  PERTINENT HISTORY: Pt was seen at [facility] ED on 08/30/23 after a fall  sustained at work. Pt works in the emergency room and reported that she slipped on a wet spot and fell onto her left shoulder and knee to avoid falling onto her L hip. Per ED note: Physical exam is generally reassuring. Shoulder joint itself has normal mobility and I am not concerned about dislocation. No evidence of clavicular injury. Mild proximal humerus fracture is possible and the patient is concerned about the fact that she has had multiple prior orthopedic injuries/fractures due to relatively minor falls. We will proceed with x-rays and anticipate restricted movement with shoulder sling, RICE, etc. Pt underwent L THA last June and has previous history of elbow surgery that prevents full movement of L wrist. Pt reports that she wore the sling provided in ED for a couple of weeks after the fall but has not worn it since.  PMH includes anemia, arthritis (back, L elbow, L hip, L ankle), asthma, DM, GERD, headache, hyperparathyroidism, lymphedema, seizure, sleep apnea  PAIN: Reported on L shoulder: Are you having pain? Yes: NPRS scale: 3/10 currently, worst 10/10, best  Pain location: anterior shoulder Pain description: Electric shocks, sharp Aggravating factors: reaching overhead/behind head, movement Relieving factors: Voltaren, Biofreeze, lidocaine  patches, rest  PRECAUTIONS: None  RED FLAGS: None   WEIGHT BEARING RESTRICTIONS: No  FALLS:  Has patient fallen in last 6 months? Yes. Number of falls 1- pt reports that this incident is her only fall  LIVING ENVIRONMENT: Lives with: lives with their spouse Lives in: Mobile home Stairs: Yes: External: 4 steps; can reach both Has following equipment at home: Single point cane and Walker - 2 wheeled  OCCUPATION: Works in ED at [employer]- pt reports that the pain hasn't affected her job duties that much because she is R handed  PLOF: Independent  PATIENT GOALS: Pt wants to be able to hold her 58 month old grandson, get range of motion back  to use her L arm normally.  NEXT MD VISIT: 10/12/23  OBJECTIVE:  Note: Objective measures were completed at Evaluation unless otherwise noted.  DIAGNOSTIC FINDINGS:  DG Humerus L (08/30/23): FINDINGS: There is no evidence of an acute fracture or dislocation. Radiopaque surgical screws and fixation wires are seen within the left olecranon process and left radial head. Soft tissues are unremarkable.   IMPRESSION: 1. No acute osseous abnormality. 2. Postoperative changes of the left elbow.  PATIENT SURVEYS:    QuickDASH Score: 68.2 / 100 = 68.2 %  LEFS: 31/80 COGNITION: Overall cognitive status: Within functional limits for tasks assessed     SENSATION: WFL- bilateral UE and LE  POSTURE: Pt holds L UE in guarded position at rest. L shoulder shifted anterior causing forward head/rounded shoulders  UPPER EXTREMITY ROM:   Active ROM Right eval Left eval  Shoulder flexion 155 100  Shoulder extension    Shoulder abduction 120 70  Shoulder adduction    Shoulder internal rotation 75 75  Shoulder external rotation 50 35  Elbow flexion    Elbow extension    Wrist flexion    Wrist extension    Wrist ulnar deviation    Wrist radial deviation    Wrist pronation    Wrist supination    (Blank rows = not tested)  UPPER EXTREMITY MMT:  MMT Right eval Left eval  Shoulder flexion 4 NT  Shoulder extension    Shoulder abduction 3+ NT  Shoulder adduction    Shoulder internal rotation    Shoulder external rotation    Middle trapezius    Lower trapezius    Elbow flexion 4 3-  Elbow extension 4 4  Wrist flexion    Wrist extension    Wrist ulnar deviation    Wrist radial deviation    Wrist pronation    Wrist supination    Grip strength (lbs)    (Blank rows = not tested)  LOWER EXTREMITY MMT:     Right Left  Hip flexion 4- 4-  Hip Abduction 4+ 4+  Hip Adduction 4+ 4+  Knee Extension  3+ 3+*  Knee Flexion 4+ 4  DF 4+ 4  PF 5 5     SHOULDER SPECIAL  TESTS: Impingement tests: Painful arc test: positive    PALPATION:  Painful on light palpation of anterior shoulder at greater tubercle; pt is highly sensitive to even light touch of shoulder girdle                                                                                                                             TREATMENT DATE: 10/18/23  Manual: Gentle L shoulder Grade 1-2 GH mobilization in supine- PA/AP/INF glides- several min each. Some pain reported with AP but improved with reps.  Gentle inf long axis distraction L shoulder x 20 - hold 3 sec Supine  L Upper trap stretch 30 seconds with gentle cervical distraction x 3 rounds Suboccipital release x45 seconds    Ther-ex: -shoulder PROM all directions, multiple reps  hold 10-20 seconds  Self care/Home management:  -Instruction in left knee stretching in seated position- Knee flex and knee ext - hold 30 sec x 2 each- instructed to perform at home 1-2x /day.   PATIENT EDUCATION: Education details: Pt provided education on healing timeline/inflammatory response and impact of heightened nervous system on pain response Person educated: Patient Education method: Explanation Education comprehension: verbalized understanding  HOME EXERCISE PROGRAM: Access Code: [email or web address] URL: [email or web address] Date: 10/18/2023 Prepared by: Reyes London  Exercises - Seated Hamstring Stretch  - 1-2 x daily - 3 sets - 30 hold - Seated Knee Flexion Stretch  - 1-2 x daily - 3 sets - 30 sec hold   Access Code: [email or web address]  URL: [email or web address]  Date: 10/05/2023  Prepared by: Clearnce Custard  Exercises  - Standing shoulder flexion wall slides  - 1 x daily - 7 x weekly - 2 sets - 10 reps - 5 hold  - Standing Shoulder Abduction Slides at Wall  - 1 x daily - 7 x weekly - 2 sets - 10 reps - 5 hold  - Standing Isometric Shoulder Flexion with Doorway - Arm Bent  - 1 x daily - 7 x weekly - 2 sets - 10 reps - 5  hold  - Standing Isometric Shoulder Abduction with Doorway - Arm Bent  - 1 x daily - 7 x weekly - 2 sets - 10 reps - 5 hold  - Standing Isometric Shoulder Internal Rotation at Doorway  - 1 x daily - 7 x weekly - 2 sets - 10 reps - 5 hold  - Standing Isometric Shoulder Extension with Doorway - Arm Bent  - 1 x daily - 7 x weekly - 2 sets - 10 reps - 5 hold  - Seated Shoulder Pendulum Exercise  - 1 x daily - 7 x weekly - 2 sets - 10 reps - 5 hold   ASSESSMENT:  CLINICAL IMPRESSION: Treatment focused on pain minimized passive mobility with some manual techniques to mobilize shoulder joint. She responded very well - high pain tolerance so had to give reminders to patient to inform PT if any motions hurt. She initially started off with some muscle guarding but did relax more during session. Overall still very pain limited with active and passive shoulder mobility. Pt has MRIs scheduled for this coming Friday, will follow-up with results. She was educated in some gentle knee stretches today with good return of demonstration for home rehab.  Pt would benefit from skilled PT intervention to decrease pain, increase AROM and strength, and improve her ability to perform functional ADLs.  OBJECTIVE IMPAIRMENTS: Abnormal gait, decreased activity tolerance, decreased endurance, decreased mobility, difficulty walking, decreased ROM, decreased strength, hypomobility, increased edema, impaired perceived functional ability, impaired flexibility, impaired UE functional use, improper body mechanics, postural dysfunction, and pain.   ACTIVITY LIMITATIONS: carrying, lifting, bending, sitting, standing, squatting, stairs, transfers, bed mobility, bathing, dressing, self feeding, reach over head, hygiene/grooming, locomotion level, and caring for others  PARTICIPATION LIMITATIONS: meal prep, cleaning, laundry, driving, shopping, community activity, occupation, and yard work  PERSONAL FACTORS: Age, Fitness, Profession, and  3+ comorbidities: anemia, arthritis (back, L elbow, L hip, L ankle), asthma, DM, GERD, headache, hyperparathyroidism, lymphedema, seizure, sleep apnea are also affecting patient's functional outcome.   REHAB POTENTIAL: Good  CLINICAL DECISION MAKING: Evolving/moderate complexity  EVALUATION COMPLEXITY: Moderate   GOALS: Goals reviewed with patient? Yes  SHORT TERM GOALS: Target date: 10/24/2023   Patient will be independent in home exercise program to  improve strength/mobility for better functional independence with ADLs. Baseline:See HEP above Goal status: INITIAL    LONG TERM GOALS: Target date: 11/28/2023   Patient will decrease Quick DASH score by > 8 points demonstrating reduced self-reported upper extremity disability. Baseline: 68.2/100 Goal status: INITIAL  2.  Patient will improve LEFS score by >9 points to align with MCID value and demonstrate improved functional mobility and increased tolerance with ADLs. Baseline: 31/80 Goal status: INITIAL  3.  Patient will improve L shoulder AROM to > 140 degrees of flexion, scaption, and abduction for improved ability to perform overhead activities. Baseline: see AROM chart above Goal status: INITIAL  4.  Patient will report a worst pain of 6/10 on VAS in L shoulder to improve tolerance with ADLs and reduced symptoms with activities. Baseline: 10/10 worst pain at eval Goal status: INITIAL  5.  Patient will be able to tolerate holding her young grandson without an increase in L shoulder pain to show improved L UE function and increase pt's ability to participate in family care. Baseline: pt unable to hold grandson at eval Goal status: INITIAL  6. Patient (< 93 years old) will complete five times sit to stand test in < 10 seconds indicating an increased LE strength and improved balance. Baseline: 20.56 seconds on 7/10 Goal status: INITIAL  PLAN:  PT FREQUENCY: 2x/week  PT DURATION: 8 weeks  PLANNED INTERVENTIONS: [id number]-  PT Re-evaluation, 97750- Physical Performance Testing, 97110-Therapeutic exercises, 97530- Therapeutic activity, [id number]- Neuromuscular re-education, 97535- Self Care, [id number]- Manual therapy, [id number]- Gait training, [phone]- Orthotic Initial, [id number]- Orthotic/Prosthetic subsequent, [phone]- Canalith repositioning, [phone]- Splinting, [phone]- Electrical stimulation (manual), [id number]- Ultrasound, [id number]- Traction (mechanical), [id number]- Parrafin, 20560 (1-2 muscles), 20561 (3+ muscles)- Dry Needling, Patient/Family education, Balance training, Stair training, Taping, Joint mobilization, Spinal mobilization, Scar mobilization, Vestibular training, DME instructions, Cryotherapy, and Moist heat  PLAN FOR NEXT SESSION: Progress L UE AROM/PROM, L knee meniscal testing, LE strengthening exercises, gentle L shoulder mobilizations    Chyrl London, PT Physical Therapist - [facility]  Outpatient Physical Therapy- Main Campus [phone]    10/18/2023, 9:12 AM

## 2023-10-19 ENCOUNTER — Ambulatory Visit

## 2023-10-20 ENCOUNTER — Ambulatory Visit
Admission: RE | Admit: 2023-10-20 | Discharge: 2023-10-20 | Disposition: A | Discharge status: Home / Self Care | Source: Ambulatory Visit | Attending: Orthopedic Surgery | Admitting: Orthopedic Surgery

## 2023-10-20 DIAGNOSIS — M75122 Complete rotator cuff tear or rupture of left shoulder, not specified as traumatic: Secondary | ICD-10-CM | POA: Diagnosis not present

## 2023-10-20 DIAGNOSIS — M25512 Pain in left shoulder: Secondary | ICD-10-CM

## 2023-10-20 DIAGNOSIS — M25562 Pain in left knee: Secondary | ICD-10-CM | POA: Diagnosis not present

## 2023-10-20 DIAGNOSIS — M19012 Primary osteoarthritis, left shoulder: Secondary | ICD-10-CM | POA: Diagnosis not present

## 2023-10-20 DIAGNOSIS — M25462 Effusion, left knee: Secondary | ICD-10-CM | POA: Diagnosis not present

## 2023-10-20 DIAGNOSIS — S46812A Strain of other muscles, fascia and tendons at shoulder and upper arm level, left arm, initial encounter: Secondary | ICD-10-CM | POA: Diagnosis not present

## 2023-10-20 DIAGNOSIS — M25412 Effusion, left shoulder: Secondary | ICD-10-CM | POA: Diagnosis not present

## 2023-10-24 ENCOUNTER — Ambulatory Visit

## 2023-10-24 DIAGNOSIS — M25512 Pain in left shoulder: Secondary | ICD-10-CM

## 2023-10-24 DIAGNOSIS — M25562 Pain in left knee: Secondary | ICD-10-CM

## 2023-10-24 DIAGNOSIS — M6281 Muscle weakness (generalized): Secondary | ICD-10-CM

## 2023-10-24 NOTE — Therapy (Signed)
 OUTPATIENT PHYSICAL THERAPY SHOULDER/KNEE TREATMENT   Patient Name: Ashley [name] MRN: [id number] DOB:February 22, 1966, 58 y.o., female Today's Date: 10/24/2023  END OF SESSION:  PT End of Session - 10/24/23 0719     Visit Number 6    Number of Visits 16    Date for PT Re-Evaluation 11/28/23    Authorization Type 8 approved WC treatments    PT Start Time 0719    PT Stop Time 0800    PT Time Calculation (min) 41 min    Activity Tolerance Patient tolerated treatment well;Patient limited by pain    Behavior During Therapy [facility] for tasks assessed/performed             Past Medical History:  Diagnosis Date   Allergy    Anemia    Arthritis    back, left elbow, left hip, left ankle    Asthma    Blood transfusion without reported diagnosis    Complication of anesthesia    delayed emergence   Diabetes mellitus without complication (HCC)    diet controlled   Diverticulosis    GERD (gastroesophageal reflux disease)    Headache    History of hiatal hernia    History of kidney stones    Hyperparathyroidism (HCC)    Lymphedema    PONV (postoperative nausea and vomiting)    S/P subtotal parathyroidectomy    Seizure (HCC)    as a child   Sleep apnea    cannot tolerate mask   Thyroid  nodule 06/20/2018   Varicose vein of leg    Past Surgical History:  Procedure Laterality Date   ACETABULUM FRACTURE SURGERY Left    CESAREAN SECTION     CHOLECYSTECTOMY     COLONOSCOPY WITH PROPOFOL  N/A 05/31/2021   Procedure: COLONOSCOPY WITH PROPOFOL ;  Surgeon: Maryruth Ole DASEN, MD;  Location: [facility] END[facility];  Service: Endoscopy;  Laterality: N/A;   ELBOW FRACTURE SURGERY  1995   ESOPHAGOGASTRODUODENOSCOPY (EGD) WITH PROPOFOL  N/A 05/31/2021   Procedure: ESOPHAGOGASTRODUODENOSCOPY (EGD) WITH PROPOFOL ;  Surgeon: Maryruth Ole DASEN, MD;  Location: [facility] ENDOSCOPY;  Service: Endoscopy;  Laterality: N/A;   FRACTURE SURGERY     left ankle complete repair, left elbow   HERNIA REPAIR      incisional   LAPAROSCOPIC GASTRIC SLEEVE RESECTION  2014   PARATHYROIDECTOMY N/A 10/25/2018   Procedure: PARATHYROIDECTOMY, DIABETIC, SLEEP APNEA;  Surgeon: Marolyn Nest, MD;  Location: [facility] ORS;  Service: General;  Laterality: N/A;   TONSILLECTOMY AND ADENOIDECTOMY N/A 12/08/2015   Procedure: TONSILLECTOMY AND ADENOIDECTOMY;  Surgeon: Chinita Hasten, MD;  Location: [facility] ORS;  Service: ENT;  Laterality: N/A;   TOTAL HIP ARTHROPLASTY Left 09/22/2022   Procedure: Left posterior total hip arthroplasty;  Surgeon: Lorelle Hussar, MD;  Location: [facility] ORS;  Service: Orthopedics;  Laterality: Left;   TUBAL LIGATION     Patient Active Problem List   Diagnosis Date Noted   Osteoarthritis of left hip 09/22/2022   Side pain 08/16/2022   Musculoskeletal pain 08/16/2022   Varicose veins of both legs with edema 01/05/2021   Swelling of limb 12/30/2020   Diabetes (HCC) 12/30/2020   Lymphedema 12/30/2020   S/P subtotal parathyroidectomy 11/08/2018   Hyperparathyroidism (HCC) 06/20/2018   Thyroid  nodule 06/20/2018   Intractable pain 04/12/2015    PCP:  Ophelia Sage, MD  REFERRING PROVIDER: Norleen Gavel, MD  REFERRING DIAG:  [phone] (ICD-10-CM) - Left shoulder pain  M25.562 (ICD-10-CM) - Left knee pain    THERAPY DIAG:  Muscle weakness (generalized)  Acute  pain of left knee  Acute pain of left shoulder  Rationale for Evaluation and Treatment: Rehabilitation  ONSET DATE: 08/30/23  SUBJECTIVE:                                                                                                                                                                                      SUBJECTIVE STATEMENT:  Pt reports 2/10 pain in the L shoulder upon arrival.  Pt continue to endorse having a high pain tolerance.  Pt reports she has not received the results from the MRI, however was told that it might take a while for it to be read.    Hand dominance: Right  PERTINENT HISTORY: Pt was seen at  [facility] ED on 08/30/23 after a fall sustained at work. Pt works in the emergency room and reported that she slipped on a wet spot and fell onto her left shoulder and knee to avoid falling onto her L hip. Per ED note: Physical exam is generally reassuring. Shoulder joint itself has normal mobility and I am not concerned about dislocation. No evidence of clavicular injury. Mild proximal humerus fracture is possible and the patient is concerned about the fact that she has had multiple prior orthopedic injuries/fractures due to relatively minor falls. We will proceed with x-rays and anticipate restricted movement with shoulder sling, RICE, etc. Pt underwent L THA last June and has previous history of elbow surgery that prevents full movement of L wrist. Pt reports that she wore the sling provided in ED for a couple of weeks after the fall but has not worn it since.  PMH includes anemia, arthritis (back, L elbow, L hip, L ankle), asthma, DM, GERD, headache, hyperparathyroidism, lymphedema, seizure, sleep apnea  PAIN: Reported on L shoulder: Are you having pain? Yes: NPRS scale: 3/10 currently, worst 10/10, best  Pain location: anterior shoulder Pain description: Electric shocks, sharp Aggravating factors: reaching overhead/behind head, movement Relieving factors: Voltaren, Biofreeze, lidocaine  patches, rest  PRECAUTIONS: None  RED FLAGS: None   WEIGHT BEARING RESTRICTIONS: No  FALLS:  Has patient fallen in last 6 months? Yes. Number of falls 1- pt reports that this incident is her only fall  LIVING ENVIRONMENT: Lives with: lives with their spouse Lives in: Mobile home Stairs: Yes: External: 4 steps; can reach both Has following equipment at home: Single point cane and Walker - 2 wheeled  OCCUPATION: Works in ED at [employer]- pt reports that the pain hasn't affected her job duties that much because she is R handed  PLOF: Independent  PATIENT GOALS: Pt wants to be able to hold her 79 month old  grandson, get range of motion back to  use her L arm normally.  NEXT MD VISIT: 10/12/23  OBJECTIVE:  Note: Objective measures were completed at Evaluation unless otherwise noted.  DIAGNOSTIC FINDINGS:  DG Humerus L (08/30/23): FINDINGS: There is no evidence of an acute fracture or dislocation. Radiopaque surgical screws and fixation wires are seen within the left olecranon process and left radial head. Soft tissues are unremarkable.   IMPRESSION: 1. No acute osseous abnormality. 2. Postoperative changes of the left elbow.  PATIENT SURVEYS:    QuickDASH Score: 68.2 / 100 = 68.2 %  LEFS: 31/80 COGNITION: Overall cognitive status: Within functional limits for tasks assessed     SENSATION: WFL- bilateral UE and LE  POSTURE: Pt holds L UE in guarded position at rest. L shoulder shifted anterior causing forward head/rounded shoulders  UPPER EXTREMITY ROM:   Active ROM Right eval Left eval  Shoulder flexion 155 100  Shoulder extension    Shoulder abduction 120 70  Shoulder adduction    Shoulder internal rotation 75 75  Shoulder external rotation 50 35  Elbow flexion    Elbow extension    Wrist flexion    Wrist extension    Wrist ulnar deviation    Wrist radial deviation    Wrist pronation    Wrist supination    (Blank rows = not tested)  UPPER EXTREMITY MMT:  MMT Right eval Left eval  Shoulder flexion 4 NT  Shoulder extension    Shoulder abduction 3+ NT  Shoulder adduction    Shoulder internal rotation    Shoulder external rotation    Middle trapezius    Lower trapezius    Elbow flexion 4 3-  Elbow extension 4 4  Wrist flexion    Wrist extension    Wrist ulnar deviation    Wrist radial deviation    Wrist pronation    Wrist supination    Grip strength (lbs)    (Blank rows = not tested)  LOWER EXTREMITY MMT:     Right Left  Hip flexion 4- 4-  Hip Abduction 4+ 4+  Hip Adduction 4+ 4+  Knee Extension  3+ 3+*  Knee Flexion 4+ 4  DF 4+ 4  PF 5 5      SHOULDER SPECIAL TESTS: Impingement tests: Painful arc test: positive    PALPATION:  Painful on light palpation of anterior shoulder at greater tubercle; pt is highly sensitive to even light touch of shoulder girdle                                                                                                                             TREATMENT DATE: 10/24/23  Manual:  Supine L shoulder GHJ mobilizations, Grades I-II for pain modulation, 30 sec bouts each direction, PA/AP/Inferior glides, x4 bouts Pt most noted pain with AP glides, but once fully relaxed, pt able to tolerate and note reduction in overall pain Supine gentle long axis distraction of the L shoulder for increased joint spacing of  the L GHJ and pain modulation  Supine STM to the L biceps with TP release technique applied to reduce tissue restrictions  Supine STM to cervical region to increase extensibility of the paraspinals Supine suboccipital release technique to decrease cervicalgia Supine manual traction performed in order to increase joint space in cervical region for pain relief Supine cervical upglides/downglides, 30 sec bouts Supine UT/Levator stretch, 30 sec bouts to increase tissue extensibility of the cervical region     TherEx:  Shoulder PROM in all planes of mobility with end range holds for ~15 seconds to improve overall ROM      PATIENT EDUCATION: Education details: Pt provided education on healing timeline/inflammatory response and impact of heightened nervous system on pain response Person educated: Patient Education method: Explanation Education comprehension: verbalized understanding  HOME EXERCISE PROGRAM: Access Code: [email or web address] URL: [email or web address] Date: 10/18/2023 Prepared by: Reyes London  Exercises - Seated Hamstring Stretch  - 1-2 x daily - 3 sets - 30 hold - Seated Knee Flexion Stretch  - 1-2 x daily - 3 sets - 30 sec hold   Access Code:  [email or web address]  URL: [email or web address]  Date: 10/05/2023  Prepared by: Marina  Moser  Exercises  - Standing shoulder flexion wall slides  - 1 x daily - 7 x weekly - 2 sets - 10 reps - 5 hold  - Standing Shoulder Abduction Slides at Wall  - 1 x daily - 7 x weekly - 2 sets - 10 reps - 5 hold  - Standing Isometric Shoulder Flexion with Doorway - Arm Bent  - 1 x daily - 7 x weekly - 2 sets - 10 reps - 5 hold  - Standing Isometric Shoulder Abduction with Doorway - Arm Bent  - 1 x daily - 7 x weekly - 2 sets - 10 reps - 5 hold  - Standing Isometric Shoulder Internal Rotation at Doorway  - 1 x daily - 7 x weekly - 2 sets - 10 reps - 5 hold  - Standing Isometric Shoulder Extension with Doorway - Arm Bent  - 1 x daily - 7 x weekly - 2 sets - 10 reps - 5 hold  - Seated Shoulder Pendulum Exercise  - 1 x daily - 7 x weekly - 2 sets - 10 reps - 5 hold   ASSESSMENT:  CLINICAL IMPRESSION:  Pt responded well to the exercises today, and noted to have some radicular symptoms when performing manual therapy on the cervical spine.  Pt noted to have tingling in the hands.  Pt may continue to benefit from performing manual therapy on the neck and performing nerve glides to assess which nerves may be contributing to the overall pain levels.  Pt radicular symptoms are not consistent with expected outcomes, such as pt reporting ulnar nerve symptoms (ring finger tingling) when performing upglides to C3, and median nerve symptoms (thumb tingling) when performing the mobilization to C7.  Will continue to monitor as therapy progresses.   Pt will continue to benefit from skilled therapy to address remaining deficits in order to improve overall QoL and return to PLOF.        OBJECTIVE IMPAIRMENTS: Abnormal gait, decreased activity tolerance, decreased endurance, decreased mobility, difficulty walking, decreased ROM, decreased strength, hypomobility, increased edema, impaired perceived functional ability, impaired  flexibility, impaired UE functional use, improper body mechanics, postural dysfunction, and pain.   ACTIVITY LIMITATIONS: carrying, lifting, bending, sitting, standing, squatting, stairs, transfers, bed mobility, bathing, dressing, self feeding, reach over head, hygiene/grooming, locomotion level, and caring  for others  PARTICIPATION LIMITATIONS: meal prep, cleaning, laundry, driving, shopping, community activity, occupation, and yard work  PERSONAL FACTORS: Age, Fitness, Profession, and 3+ comorbidities: anemia, arthritis (back, L elbow, L hip, L ankle), asthma, DM, GERD, headache, hyperparathyroidism, lymphedema, seizure, sleep apnea are also affecting patient's functional outcome.   REHAB POTENTIAL: Good  CLINICAL DECISION MAKING: Evolving/moderate complexity  EVALUATION COMPLEXITY: Moderate   GOALS: Goals reviewed with patient? Yes  SHORT TERM GOALS: Target date: 10/24/2023   Patient will be independent in home exercise program to improve strength/mobility for better functional independence with ADLs. Baseline:See HEP above Goal status: INITIAL    LONG TERM GOALS: Target date: 11/28/2023   Patient will decrease Quick DASH score by > 8 points demonstrating reduced self-reported upper extremity disability. Baseline: 68.2/100 Goal status: INITIAL  2.  Patient will improve LEFS score by >9 points to align with MCID value and demonstrate improved functional mobility and increased tolerance with ADLs. Baseline: 31/80 Goal status: INITIAL  3.  Patient will improve L shoulder AROM to > 140 degrees of flexion, scaption, and abduction for improved ability to perform overhead activities. Baseline: see AROM chart above Goal status: INITIAL  4.  Patient will report a worst pain of 6/10 on VAS in L shoulder to improve tolerance with ADLs and reduced symptoms with activities. Baseline: 10/10 worst pain at eval Goal status: INITIAL  5.  Patient will be able to tolerate holding her  young grandson without an increase in L shoulder pain to show improved L UE function and increase pt's ability to participate in family care. Baseline: pt unable to hold grandson at eval Goal status: INITIAL  6. Patient (< 64 years old) will complete five times sit to stand test in < 10 seconds indicating an increased LE strength and improved balance. Baseline: 20.56 seconds on 7/10 Goal status: INITIAL  PLAN:  PT FREQUENCY: 2x/week  PT DURATION: 8 weeks  PLANNED INTERVENTIONS: 97164- PT Re-evaluation, 97750- Physical Performance Testing, 97110-Therapeutic exercises, 97530- Therapeutic activity, [id number]- Neuromuscular re-education, 97535- Self Care, [id number]- Manual therapy, [id number]- Gait training, [phone]- Orthotic Initial, [phone]- Orthotic/Prosthetic subsequent, [phone]- Canalith repositioning, [id number]- Splinting, [id number]- Electrical stimulation (manual), [id number]- Ultrasound, [id number]- Traction (mechanical), [id number]- Parrafin, 20560 (1-2 muscles), 20561 (3+ muscles)- Dry Needling, Patient/Family education, Balance training, Stair training, Taping, Joint mobilization, Spinal mobilization, Scar mobilization, Vestibular training, DME instructions, Cryotherapy, and Moist heat  PLAN FOR NEXT SESSION:  Progress L UE AROM/PROM, L knee meniscal testing, LE strengthening exercises, gentle L shoulder mobilizations    Fonda Simpers, PT, DPT Physical Therapist - [place]  [facility]  10/24/23, 7:19 AM

## 2023-10-25 NOTE — Therapy (Signed)
 OUTPATIENT PHYSICAL THERAPY SHOULDER/KNEE TREATMENT   Patient Name: [name] MRN: [id number] DOB:11-Aug-1965, 58 y.o., female Today's Date: 10/26/2023  END OF SESSION:  PT End of Session - 10/26/23 0814     Visit Number 7    Number of Visits 16    Date for PT Re-Evaluation 11/28/23    Authorization Type 8 approved WC treatments    PT Start Time 0814    PT Stop Time 0843    PT Time Calculation (min) 29 min    Activity Tolerance Patient tolerated treatment well;Patient limited by pain    Behavior During Therapy [name] for tasks assessed/performed              Past Medical History:  Diagnosis Date   Allergy    Anemia    Arthritis    back, left elbow, left hip, left ankle    Asthma    Blood transfusion without reported diagnosis    Complication of anesthesia    delayed emergence   Diabetes mellitus without complication (HCC)    diet controlled   Diverticulosis    GERD (gastroesophageal reflux disease)    Headache    History of hiatal hernia    History of kidney stones    Hyperparathyroidism (HCC)    Lymphedema    PONV (postoperative nausea and vomiting)    [name] subtotal parathyroidectomy    Seizure (HCC)    as a child   Sleep apnea    cannot tolerate mask   Thyroid  nodule 06/20/2018   Varicose vein of leg    Past Surgical History:  Procedure Laterality Date   ACETABULUM FRACTURE SURGERY Left    CESAREAN SECTION     CHOLECYSTECTOMY     COLONOSCOPY WITH PROPOFOL  N/A 05/31/2021   Procedure: COLONOSCOPY WITH PROPOFOL ;  Surgeon: Ashley Ole DASEN, [name];  Location: [name];  Service: [name];  Laterality: N/A;   ELBOW FRACTURE SURGERY  1995   ESOPHAGOGASTRODUODENOSCOPY (EGD) WITH PROPOFOL  N/A 05/31/2021   Procedure: ESOPHAGOGASTRODUODENOSCOPY (EGD) WITH PROPOFOL ;  Surgeon: Ashley Ole DASEN, [name];  Location: [name];  Service: [name];  Laterality: N/A;   FRACTURE SURGERY     left ankle complete repair, left elbow   HERNIA  REPAIR     incisional   LAPAROSCOPIC GASTRIC SLEEVE RESECTION  2014   PARATHYROIDECTOMY N/A 10/25/2018   Procedure: PARATHYROIDECTOMY, DIABETIC, SLEEP APNEA;  Surgeon: Ashley Nest, [name];  Location: [name];  Service: General;  Laterality: N/A;   TONSILLECTOMY AND ADENOIDECTOMY N/A 12/08/2015   Procedure: TONSILLECTOMY AND ADENOIDECTOMY;  Surgeon: Ashley Hasten, [name];  Location: [name];  Service: [name];  Laterality: N/A;   TOTAL HIP ARTHROPLASTY Left 09/22/2022   Procedure: Left posterior total hip arthroplasty;  Surgeon: Ashley Hussar, [name];  Location: [name];  Service: [name];  Laterality: Left;   TUBAL LIGATION     Patient Active Problem List   Diagnosis Date Noted   Osteoarthritis of left hip 09/22/2022   Side pain 08/16/2022   Musculoskeletal pain 08/16/2022   Varicose veins of both legs with edema 01/05/2021   Swelling of limb 12/30/2020   Diabetes (HCC) 12/30/2020   Lymphedema 12/30/2020   [name] subtotal parathyroidectomy 11/08/2018   Hyperparathyroidism (HCC) 06/20/2018   Thyroid  nodule 06/20/2018   Intractable pain 04/12/2015    PCP:  Ashley Sage, [name]  REFERRING PROVIDER: Norleen Gavel, [name]  REFERRING DIAG:  [phone] (ICD-10-CM) - Left shoulder pain  M25.562 (ICD-10-CM) - Left knee pain    THERAPY DIAG:  Muscle weakness (generalized)  Acute pain of left knee  Acute pain of left shoulder  Rationale for Evaluation and Treatment: Rehabilitation  ONSET DATE: 08/30/23  SUBJECTIVE:                                                                                                                                                                                      SUBJECTIVE STATEMENT:   Patient reports her L shoulder is pretty achy this morning. Yesterday was a pretty painful day but unsure what was going on. Her legs were more achy yesterday. Pt reports she has not received the results from the MRI, however was told that it might take a while for it to be  read.    Hand dominance: Right  PERTINENT HISTORY: [facility] at [name] after a fall sustained at work. Pt works in the emergency room and reported that she slipped on a wet spot and fell onto her left shoulder and knee to avoid falling onto her L hip. Per [name] note: Physical exam is generally reassuring. Shoulder joint itself has normal mobility and I am not concerned about dislocation. No evidence of clavicular injury. Mild proximal humerus fracture is possible and the patient is concerned about the fact that she has had multiple prior orthopedic injuries/fractures due to relatively minor falls. We will proceed with x-rays and anticipate restricted movement with shoulder sling, RICE, etc. Pt underwent L THA last June and has previous history of elbow surgery that prevents full movement of L wrist. Pt reports that she wore the sling provided in [name] for a couple of weeks after the fall but has not worn it since.  PMH includes anemia, arthritis (back, L elbow, L hip, L ankle), asthma, DM, GERD, headache, hyperparathyroidism, lymphedema, seizure, sleep apnea  PAIN: Reported on L shoulder: Are you having pain? Yes: NPRS scale: 3/10 currently, worst 10/10, best  Pain location: anterior shoulder Pain description: Electric shocks, sharp Aggravating factors: reaching overhead/behind head, movement Relieving factors: Voltaren, Biofreeze, lidocaine  patches, rest  PRECAUTIONS: None  RED FLAGS: None   WEIGHT BEARING RESTRICTIONS: No  FALLS:  Has patient fallen in last 6 months? Yes. Number of falls 1- pt reports that this incident is her only fall  LIVING ENVIRONMENT: Lives with: lives with their spouse Lives in: Mobile home Stairs: Yes: External: 4 steps; can reach both Has following equipment at home: Single point cane and [name] - 2 wheeled  OCCUPATION: Works in [name]- pt reports that the pain hasn't affected her job duties that much because she is R handed  PLOF:  Independent  PATIENT GOALS: Pt wants to be able to hold her 7  month old grandson, get range of motion back to use her L arm normally.  NEXT [name] VISIT: 10/12/23  OBJECTIVE:  Note: Objective measures were completed at Evaluation unless otherwise noted.  DIAGNOSTIC FINDINGS:  DG Humerus L (08/30/23): FINDINGS: There is no evidence of an acute fracture or dislocation. Radiopaque surgical screws and fixation wires are seen within the left olecranon process and left radial head. Soft tissues are unremarkable.   IMPRESSION: 1. No acute osseous abnormality. 2. Postoperative changes of the left elbow.  PATIENT SURVEYS:    QuickDASH Score: 68.2 / 100 = 68.2 %  LEFS: 31/80 COGNITION: Overall cognitive status: Within functional limits for tasks assessed     SENSATION: WFL- bilateral UE and LE  POSTURE: Pt holds L UE in guarded position at rest. L shoulder shifted anterior causing forward head/rounded shoulders  UPPER EXTREMITY ROM:   Active ROM Right eval Left eval  Shoulder flexion 155 100  Shoulder extension    Shoulder abduction 120 70  Shoulder adduction    Shoulder internal rotation 75 75  Shoulder external rotation 50 35  Elbow flexion    Elbow extension    Wrist flexion    Wrist extension    Wrist ulnar deviation    Wrist radial deviation    Wrist pronation    Wrist supination    (Blank rows = not tested)  UPPER EXTREMITY MMT:  MMT Right eval Left eval  Shoulder flexion 4 NT  Shoulder extension    Shoulder abduction 3+ NT  Shoulder adduction    Shoulder internal rotation    Shoulder external rotation    Middle trapezius    Lower trapezius    Elbow flexion 4 3-  Elbow extension 4 4  Wrist flexion    Wrist extension    Wrist ulnar deviation    Wrist radial deviation    Wrist pronation    Wrist supination    Grip strength (lbs)    (Blank rows = not tested)  LOWER EXTREMITY MMT:     Right Left  Hip flexion 4- 4-  Hip Abduction 4+ 4+  Hip  Adduction 4+ 4+  Knee Extension  3+ 3+*  Knee Flexion 4+ 4  DF 4+ 4  PF 5 5     SHOULDER SPECIAL TESTS: Impingement tests: Painful arc test: positive    PALPATION:  Painful on light palpation of anterior shoulder at greater tubercle; pt is highly sensitive to even light touch of shoulder girdle                                                                                                                             TREATMENT DATE: 10/26/23   Manual:   Supine L shoulder GHJ mobilizations, Grades I-II for pain modulation, 30 sec bouts each direction, PA/AP/Inferior glides, x4 bouts Supine gentle long axis distraction of the L shoulder for increased joint spacing of the L GHJ and pain m odulation   Supine  STM to cervical region to increase extensibility of the paraspinals Supine suboccipital release technique to decrease cervicalgia Supine manual traction performed in order to increase joint space in cervical region for pain relief Supine UT/Levator stretch, 2 x 30 sec bouts each side to increase tissue extensibility of the cervical region     TherEx:  Shoulder PROM in all planes of mobility with end range holds for ~15 seconds to improve overall ROM  Seated UT stretch L/R 2 x 30 seconds each with hand under buttocks for GHJ stabilization  Seated levator stretch L/R 2 x 30 seconds each with hand under buttocks for GHJ stabilization   PATIENT EDUCATION: Education details: Pt provided education on healing timeline/inflammatory response and impact of heightened nervous system on pain response Person educated: Patient Education method: Explanation Education comprehension: verbalized understanding  HOME EXERCISE PROGRAM: Access Code: [email or web address] URL: [email or web address] Date: 10/18/2023 Prepared by: Reyes London  Exercises - Seated Hamstring Stretch  - 1-2 x daily - 3 sets - 30 hold - Seated Knee Flexion Stretch  - 1-2 x daily - 3 sets - 30 sec  hold   Access Code: [email or web address]  URL: [email or web address]  Date: 10/05/2023  Prepared by: Marina  Moser  Exercises  - Standing shoulder flexion wall slides  - 1 x daily - 7 x weekly - 2 sets - 10 reps - 5 hold  - Standing Shoulder Abduction Slides at Wall  - 1 x daily - 7 x weekly - 2 sets - 10 reps - 5 hold  - Standing Isometric Shoulder Flexion with Doorway - Arm Bent  - 1 x daily - 7 x weekly - 2 sets - 10 reps - 5 hold  - Standing Isometric Shoulder Abduction with Doorway - Arm Bent  - 1 x daily - 7 x weekly - 2 sets - 10 reps - 5 hold  - Standing Isometric Shoulder Internal Rotation at Doorway  - 1 x daily - 7 x weekly - 2 sets - 10 reps - 5 hold  - Standing Isometric Shoulder Extension with Doorway - Arm Bent  - 1 x daily - 7 x weekly - 2 sets - 10 reps - 5 hold  - Seated Shoulder Pendulum Exercise  - 1 x daily - 7 x weekly - 2 sets - 10 reps - 5 hold   ASSESSMENT:  CLINICAL IMPRESSION:    Patient arrives to treatment session motivated to participate. Continues to experience radicular symptoms to L hand with manual therapy to cervical spine but continues to be inconsistent with typical nerve patterns. Instructed patient on seated UT/levator stretch for HEP use with good demonstration. Continues to await for MRI results. Pt will continue to benefit from skilled therapy to address remaining deficits in order to improve overall QoL and return to PLOF.     OBJECTIVE IMPAIRMENTS: Abnormal gait, decreased activity tolerance, decreased endurance, decreased mobility, difficulty walking, decreased ROM, decreased strength, hypomobility, increased edema, impaired perceived functional ability, impaired flexibility, impaired UE functional use, improper body mechanics, postural dysfunction, and pain.   ACTIVITY LIMITATIONS: carrying, lifting, bending, sitting, standing, squatting, stairs, transfers, bed mobility, bathing, dressing, self feeding, reach over head, hygiene/grooming,  locomotion level, and caring for others  PARTICIPATION LIMITATIONS: meal prep, cleaning, laundry, driving, shopping, community activity, occupation, and yard work  PERSONAL FACTORS: Age, Fitness, Profession, and 3+ comorbidities: anemia, arthritis (back, L elbow, L hip, L ankle), asthma, DM, GERD, headache, hyperparathyroidism, lymphedema, seizure, sleep apnea are also affecting patient's functional outcome.   REHAB POTENTIAL:  Good  CLINICAL DECISION MAKING: Evolving/moderate complexity  EVALUATION COMPLEXITY: Moderate   GOALS: Goals reviewed with patient? Yes  SHORT TERM GOALS: Target date: 10/24/2023   Patient will be independent in home exercise program to improve strength/mobility for better functional independence with ADLs. Baseline:See HEP above Goal status: INITIAL    LONG TERM GOALS: Target date: 11/28/2023   Patient will decrease Quick DASH score by > 8 points demonstrating reduced self-reported upper extremity disability. Baseline: 68.2/100 Goal status: INITIAL  2.  Patient will improve LEFS score by >9 points to align with MCID value and demonstrate improved functional mobility and increased tolerance with ADLs. Baseline: 31/80 Goal status: INITIAL  3.  Patient will improve L shoulder AROM to > 140 degrees of flexion, scaption, and abduction for improved ability to perform overhead activities. Baseline: see AROM chart above Goal status: INITIAL  4.  Patient will report a worst pain of 6/10 on VAS in L shoulder to improve tolerance with ADLs and reduced symptoms with activities. Baseline: 10/10 worst pain at eval Goal status: INITIAL  5.  Patient will be able to tolerate holding her young grandson without an increase in L shoulder pain to show improved L UE function and increase pt's ability to participate in family care. Baseline: pt unable to hold grandson at eval Goal status: INITIAL  6. Patient (< 7 years old) will complete five times sit to stand test in  < 10 seconds indicating an increased LE strength and improved balance. Baseline: 20.56 seconds on 7/10 Goal status: INITIAL  PLAN:  PT FREQUENCY: 2x/week  PT DURATION: 8 weeks  PLANNED INTERVENTIONS: 97164- PT Re-evaluation, 97750- Physical Performance Testing, 97110-Therapeutic exercises, 97530- Therapeutic activity, [id number]- Neuromuscular re-education, 97535- Self Care, [id number]- Manual therapy, [id number]- Gait training, [id number]- Orthotic Initial, [phone]- Orthotic/Prosthetic subsequent, [phone]- Canalith repositioning, [id number]- Splinting, [id number]- Electrical stimulation (manual), [id number]- Ultrasound, [id number]- Traction (mechanical), [id number]- Parrafin, 20560 (1-2 muscles), 20561 (3+ muscles)- Dry Needling, Patient/Family education, Balance training, Stair training, Taping, Joint mobilization, Spinal mobilization, Scar mobilization, Vestibular training, DME instructions, Cryotherapy, and Moist heat  PLAN FOR NEXT SESSION:  Progress L UE AROM/PROM, L knee meniscal testing, LE strengthening exercises, gentle L shoulder mobilizations   Maryanne Finder, PT, DPT  [name]  [facility]  10/26/23, 8:15 AM

## 2023-10-26 ENCOUNTER — Ambulatory Visit

## 2023-10-26 DIAGNOSIS — M25512 Pain in left shoulder: Secondary | ICD-10-CM

## 2023-10-26 DIAGNOSIS — M25562 Pain in left knee: Secondary | ICD-10-CM

## 2023-10-26 DIAGNOSIS — M6281 Muscle weakness (generalized): Secondary | ICD-10-CM

## 2023-10-30 ENCOUNTER — Other Ambulatory Visit: Payer: Self-pay

## 2023-10-30 NOTE — Therapy (Signed)
 OUTPATIENT PHYSICAL THERAPY SHOULDER/KNEE TREATMENT   Patient Name: Ashley [name] MRN: [id number] DOB:12-26-1965, 58 y.o., female Today's Date: 10/31/2023  END OF SESSION:  PT End of Session - 10/31/23 0744     Visit Number 8    Number of Visits 16    Date for PT Re-Evaluation 11/28/23    Authorization Type 8 approved WC treatments    PT Start Time 0741    PT Stop Time 0800    PT Time Calculation (min) 19 min    Activity Tolerance Patient tolerated treatment well;Patient limited by pain    Behavior During Therapy [facility] for tasks assessed/performed               Past Medical History:  Diagnosis Date   Allergy    Anemia    Arthritis    back, left elbow, left hip, left ankle    Asthma    Blood transfusion without reported diagnosis    Complication of anesthesia    delayed emergence   Diabetes mellitus without complication (HCC)    diet controlled   Diverticulosis    GERD (gastroesophageal reflux disease)    Headache    History of hiatal hernia    History of kidney stones    Hyperparathyroidism (HCC)    Lymphedema    PONV (postoperative nausea and vomiting)    S/P subtotal parathyroidectomy    Seizure (HCC)    as a child   Sleep apnea    cannot tolerate mask   Thyroid  nodule 06/20/2018   Varicose vein of leg    Past Surgical History:  Procedure Laterality Date   ACETABULUM FRACTURE SURGERY Left    CESAREAN SECTION     CHOLECYSTECTOMY     COLONOSCOPY WITH PROPOFOL  N/A 05/31/2021   Procedure: COLONOSCOPY WITH PROPOFOL ;  Surgeon: Maryruth Ole DASEN, MD;  Location: [facility] ENDOSCOPY;  Service: Endoscopy;  Laterality: N/A;   ELBOW FRACTURE SURGERY  1995   ESOPHAGOGASTRODUODENOSCOPY (EGD) WITH PROPOFOL  N/A 05/31/2021   Procedure: ESOPHAGOGASTRODUODENOSCOPY (EGD) WITH PROPOFOL ;  Surgeon: Maryruth Ole DASEN, MD;  Location: [facility] ENDOSCOPY;  Service: Endoscopy;  Laterality: N/A;   FRACTURE SURGERY     left ankle complete repair, left elbow   HERNIA  REPAIR     incisional   LAPAROSCOPIC GASTRIC SLEEVE RESECTION  2014   PARATHYROIDECTOMY N/A 10/25/2018   Procedure: PARATHYROIDECTOMY, DIABETIC, SLEEP APNEA;  Surgeon: Marolyn Nest, MD;  Location: [facility] ORS;  Service: General;  Laterality: N/A;   TONSILLECTOMY AND ADENOIDECTOMY N/A 12/08/2015   Procedure: TONSILLECTOMY AND ADENOIDECTOMY;  Surgeon: Chinita Hasten, MD;  Location: [facility] ORS;  Service: ENT;  Laterality: N/A;   TOTAL HIP ARTHROPLASTY Left 09/22/2022   Procedure: Left posterior total hip arthroplasty;  Surgeon: Lorelle Hussar, MD;  Location: [facility] ORS;  Service: Orthopedics;  Laterality: Left;   TUBAL LIGATION     Patient Active Problem List   Diagnosis Date Noted   Osteoarthritis of left hip 09/22/2022   Side pain 08/16/2022   Musculoskeletal pain 08/16/2022   Varicose veins of both legs with edema 01/05/2021   Swelling of limb 12/30/2020   Diabetes (HCC) 12/30/2020   Lymphedema 12/30/2020   S/P subtotal parathyroidectomy 11/08/2018   Hyperparathyroidism (HCC) 06/20/2018   Thyroid  nodule 06/20/2018   Intractable pain 04/12/2015    PCP:  Ophelia Sage, MD  REFERRING PROVIDER: Norleen Gavel, MD  REFERRING DIAG:  [phone] (ICD-10-CM) - Left shoulder pain  M25.562 (ICD-10-CM) - Left knee pain    THERAPY DIAG:  Muscle weakness (generalized)  Acute pain of left knee  Acute pain of left shoulder  Rationale for Evaluation and Treatment: Rehabilitation  ONSET DATE: 08/30/23  SUBJECTIVE:                                                                                                                                                                                      SUBJECTIVE STATEMENT:   Pt late to appointment due to oversleeping, but wanted to have therapy as her shoulder has been bothering her a lot.  Pt notes she was able to read her MRI and is hoping to get more information from MD soon.  Pt would like to continue with therapy in the meantime, however has to have  it approved by workers comp.    Hand dominance: Right  PERTINENT HISTORY: Pt was seen at [facility] ED on 08/30/23 after a fall sustained at work. Pt works in the emergency room and reported that she slipped on a wet spot and fell onto her left shoulder and knee to avoid falling onto her L hip. Per ED note: Physical exam is generally reassuring. Shoulder joint itself has normal mobility and I am not concerned about dislocation. No evidence of clavicular injury. Mild proximal humerus fracture is possible and the patient is concerned about the fact that she has had multiple prior orthopedic injuries/fractures due to relatively minor falls. We will proceed with x-rays and anticipate restricted movement with shoulder sling, RICE, etc. Pt underwent L THA last June and has previous history of elbow surgery that prevents full movement of L wrist. Pt reports that she wore the sling provided in ED for a couple of weeks after the fall but has not worn it since.  PMH includes anemia, arthritis (back, L elbow, L hip, L ankle), asthma, DM, GERD, headache, hyperparathyroidism, lymphedema, seizure, sleep apnea  PAIN: Reported on L shoulder: Are you having pain? Yes: NPRS scale: 3/10 currently, worst 10/10, best  Pain location: anterior shoulder Pain description: Electric shocks, sharp Aggravating factors: reaching overhead/behind head, movement Relieving factors: Voltaren, Biofreeze, lidocaine  patches, rest  PRECAUTIONS: None  RED FLAGS: None   WEIGHT BEARING RESTRICTIONS: No  FALLS:  Has patient fallen in last 6 months? Yes. Number of falls 1- pt reports that this incident is her only fall  LIVING ENVIRONMENT: Lives with: lives with their spouse Lives in: Mobile home Stairs: Yes: External: 4 steps; can reach both Has following equipment at home: Single point cane and Walker - 2 wheeled  OCCUPATION: Works in ED at [employer]- pt reports that the pain hasn't affected her job duties that much because she is  R handed  PLOF: Independent  PATIENT GOALS: Pt  wants to be able to hold her 49 month old grandson, get range of motion back to use her L arm normally.  NEXT MD VISIT: 10/12/23  OBJECTIVE:  Note: Objective measures were completed at Evaluation unless otherwise noted.  DIAGNOSTIC FINDINGS:  DG Humerus L (08/30/23): FINDINGS: There is no evidence of an acute fracture or dislocation. Radiopaque surgical screws and fixation wires are seen within the left olecranon process and left radial head. Soft tissues are unremarkable.   IMPRESSION: 1. No acute osseous abnormality. 2. Postoperative changes of the left elbow.  PATIENT SURVEYS:    QuickDASH Score: 68.2 / 100 = 68.2 %  LEFS: 31/80 COGNITION: Overall cognitive status: Within functional limits for tasks assessed     SENSATION: WFL- bilateral UE and LE  POSTURE: Pt holds L UE in guarded position at rest. L shoulder shifted anterior causing forward head/rounded shoulders  UPPER EXTREMITY ROM:   Active ROM Right eval Left eval  Shoulder flexion 155 100  Shoulder extension    Shoulder abduction 120 70  Shoulder adduction    Shoulder internal rotation 75 75  Shoulder external rotation 50 35  Elbow flexion    Elbow extension    Wrist flexion    Wrist extension    Wrist ulnar deviation    Wrist radial deviation    Wrist pronation    Wrist supination    (Blank rows = not tested)  UPPER EXTREMITY MMT:  MMT Right eval Left eval  Shoulder flexion 4 NT  Shoulder extension    Shoulder abduction 3+ NT  Shoulder adduction    Shoulder internal rotation    Shoulder external rotation    Middle trapezius    Lower trapezius    Elbow flexion 4 3-  Elbow extension 4 4  Wrist flexion    Wrist extension    Wrist ulnar deviation    Wrist radial deviation    Wrist pronation    Wrist supination    Grip strength (lbs)    (Blank rows = not tested)  LOWER EXTREMITY MMT:     Right Left  Hip flexion 4- 4-  Hip Abduction  4+ 4+  Hip Adduction 4+ 4+  Knee Extension  3+ 3+*  Knee Flexion 4+ 4  DF 4+ 4  PF 5 5     SHOULDER SPECIAL TESTS: Impingement tests: Painful arc test: positive    PALPATION:  Painful on light palpation of anterior shoulder at greater tubercle; pt is highly sensitive to even light touch of shoulder girdle                                                                                                                             TREATMENT DATE: 10/31/23   Manual:   Supine L shoulder GHJ mobilizations, Grades I-II for pain modulation, 30 sec bouts each direction, PA/AP/Inferior glides, x4 bouts Supine gentle long axis distraction of the L shoulder for increased joint spacing of the L  GHJ and pain m odulation Supine PROM of the shoulder in all planes of mobility and pt noting pain reduction with passive ER Supine STM to L upper trap region to increase extensibility of the musculature    PATIENT EDUCATION: Education details: Pt provided education on healing timeline/inflammatory response and impact of heightened nervous system on pain response Person educated: Patient Education method: Explanation Education comprehension: verbalized understanding  HOME EXERCISE PROGRAM: Access Code: [email or web address] URL: [email or web address] Date: 10/18/2023 Prepared by: Reyes London  Exercises - Seated Hamstring Stretch  - 1-2 x daily - 3 sets - 30 hold - Seated Knee Flexion Stretch  - 1-2 x daily - 3 sets - 30 sec hold   Access Code: [email or web address]  URL: [email or web address]  Date: 10/05/2023  Prepared by: Marina  Moser  Exercises  - Standing shoulder flexion wall slides  - 1 x daily - 7 x weekly - 2 sets - 10 reps - 5 hold  - Standing Shoulder Abduction Slides at Wall  - 1 x daily - 7 x weekly - 2 sets - 10 reps - 5 hold  - Standing Isometric Shoulder Flexion with Doorway - Arm Bent  - 1 x daily - 7 x weekly - 2 sets - 10 reps - 5 hold  - Standing Isometric  Shoulder Abduction with Doorway - Arm Bent  - 1 x daily - 7 x weekly - 2 sets - 10 reps - 5 hold  - Standing Isometric Shoulder Internal Rotation at Doorway  - 1 x daily - 7 x weekly - 2 sets - 10 reps - 5 hold  - Standing Isometric Shoulder Extension with Doorway - Arm Bent  - 1 x daily - 7 x weekly - 2 sets - 10 reps - 5 hold  - Seated Shoulder Pendulum Exercise  - 1 x daily - 7 x weekly - 2 sets - 10 reps - 5 hold   ASSESSMENT:  CLINICAL IMPRESSION:    Pt responded well to the manual therapy, noting a reduction in overall pain upon leaving.  Pt encouraged to reach out to the clinic when she is cleared/approved for more visits.  Pt otherwise is awaiting surgery and/or continuation of therapy at this time.  Pt encouraged to perform HEP to keep current ROM.   Pt will continue to benefit from skilled therapy to address remaining deficits in order to improve overall QoL and return to PLOF.       OBJECTIVE IMPAIRMENTS: Abnormal gait, decreased activity tolerance, decreased endurance, decreased mobility, difficulty walking, decreased ROM, decreased strength, hypomobility, increased edema, impaired perceived functional ability, impaired flexibility, impaired UE functional use, improper body mechanics, postural dysfunction, and pain.   ACTIVITY LIMITATIONS: carrying, lifting, bending, sitting, standing, squatting, stairs, transfers, bed mobility, bathing, dressing, self feeding, reach over head, hygiene/grooming, locomotion level, and caring for others  PARTICIPATION LIMITATIONS: meal prep, cleaning, laundry, driving, shopping, community activity, occupation, and yard work  PERSONAL FACTORS: Age, Fitness, Profession, and 3+ comorbidities: anemia, arthritis (back, L elbow, L hip, L ankle), asthma, DM, GERD, headache, hyperparathyroidism, lymphedema, seizure, sleep apnea are also affecting patient's functional outcome.   REHAB POTENTIAL: Good  CLINICAL DECISION MAKING: Evolving/moderate  complexity  EVALUATION COMPLEXITY: Moderate   GOALS: Goals reviewed with patient? Yes  SHORT TERM GOALS: Target date: 10/24/2023   Patient will be independent in home exercise program to improve strength/mobility for better functional independence with ADLs. Baseline:See HEP above Goal status: INITIAL    LONG TERM GOALS: Target date: 11/28/2023   Patient  will decrease Quick DASH score by > 8 points demonstrating reduced self-reported upper extremity disability. Baseline: 68.2/100 Goal status: INITIAL  2.  Patient will improve LEFS score by >9 points to align with MCID value and demonstrate improved functional mobility and increased tolerance with ADLs. Baseline: 31/80 Goal status: INITIAL  3.  Patient will improve L shoulder AROM to > 140 degrees of flexion, scaption, and abduction for improved ability to perform overhead activities. Baseline: see AROM chart above Goal status: INITIAL  4.  Patient will report a worst pain of 6/10 on VAS in L shoulder to improve tolerance with ADLs and reduced symptoms with activities. Baseline: 10/10 worst pain at eval Goal status: INITIAL  5.  Patient will be able to tolerate holding her young grandson without an increase in L shoulder pain to show improved L UE function and increase pt's ability to participate in family care. Baseline: pt unable to hold grandson at eval Goal status: INITIAL  6. Patient (< 42 years old) will complete five times sit to stand test in < 10 seconds indicating an increased LE strength and improved balance. Baseline: 20.56 seconds on 7/10 Goal status: INITIAL  PLAN:  PT FREQUENCY: 2x/week  PT DURATION: 8 weeks  PLANNED INTERVENTIONS: 97164- PT Re-evaluation, 97750- Physical Performance Testing, 97110-Therapeutic exercises, 97530- Therapeutic activity, [id number]- Neuromuscular re-education, 97535- Self Care, [id number]- Manual therapy, [id number]- Gait training, [phone]- Orthotic Initial, [phone]- Orthotic/Prosthetic  subsequent, [id number]- Canalith repositioning, [id number]- Splinting, [id number]- Electrical stimulation (manual), [id number]- Ultrasound, [id number]- Traction (mechanical), [id number]- Parrafin, 20560 (1-2 muscles), 20561 (3+ muscles)- Dry Needling, Patient/Family education, Balance training, Stair training, Taping, Joint mobilization, Spinal mobilization, Scar mobilization, Vestibular training, DME instructions, Cryotherapy, and Moist heat  PLAN FOR NEXT SESSION:   Progress L UE AROM/PROM, L knee meniscal testing, LE strengthening exercises, gentle L shoulder mobilizations   Fonda Simpers, PT, DPT Physical Therapist - [place]  [facility]  10/31/23, 8:05 AM

## 2023-10-31 ENCOUNTER — Ambulatory Visit: Attending: Orthopedic Surgery

## 2023-10-31 DIAGNOSIS — M25512 Pain in left shoulder: Secondary | ICD-10-CM | POA: Insufficient documentation

## 2023-10-31 DIAGNOSIS — M6281 Muscle weakness (generalized): Secondary | ICD-10-CM | POA: Insufficient documentation

## 2023-10-31 DIAGNOSIS — M25562 Pain in left knee: Secondary | ICD-10-CM | POA: Diagnosis present

## 2023-11-01 ENCOUNTER — Ambulatory Visit

## 2023-11-02 ENCOUNTER — Ambulatory Visit

## 2023-11-15 ENCOUNTER — Ambulatory Visit: Attending: Orthopedic Surgery

## 2023-11-15 DIAGNOSIS — M6281 Muscle weakness (generalized): Secondary | ICD-10-CM | POA: Insufficient documentation

## 2023-11-15 DIAGNOSIS — M25512 Pain in left shoulder: Secondary | ICD-10-CM | POA: Insufficient documentation

## 2023-11-15 DIAGNOSIS — M25562 Pain in left knee: Secondary | ICD-10-CM | POA: Diagnosis not present

## 2023-11-15 NOTE — Therapy (Signed)
 OUTPATIENT PHYSICAL THERAPY SHOULDER/KNEE TREATMENT   Patient Name: WOODIE TRUSTY MRN: [id number] DOB:08-26-1965, 58 y.o., female Today's Date: 11/15/2023  END OF SESSION:  PT End of Session - 11/15/23 0721     Visit Number 9    Number of Visits 16    Date for PT Re-Evaluation 11/28/23    Authorization Type 8 approved WC treatments    PT Start Time 0718    PT Stop Time 0800    PT Time Calculation (min) 42 min    Activity Tolerance Patient tolerated treatment well;Patient limited by pain    Behavior During Therapy [facility] for tasks assessed/performed               Past Medical History:  Diagnosis Date   Allergy    Anemia    Arthritis    back, left elbow, left hip, left ankle    Asthma    Blood transfusion without reported diagnosis    Complication of anesthesia    delayed emergence   Diabetes mellitus without complication (HCC)    diet controlled   Diverticulosis    GERD (gastroesophageal reflux disease)    Headache    History of hiatal hernia    History of kidney stones    Hyperparathyroidism (HCC)    Lymphedema    PONV (postoperative nausea and vomiting)    S/P subtotal parathyroidectomy    Seizure (HCC)    as a child   Sleep apnea    cannot tolerate mask   Thyroid  nodule 06/20/2018   Varicose vein of leg    Past Surgical History:  Procedure Laterality Date   ACETABULUM FRACTURE SURGERY Left    CESAREAN SECTION     CHOLECYSTECTOMY     COLONOSCOPY WITH PROPOFOL  N/A 05/31/2021   Procedure: COLONOSCOPY WITH PROPOFOL ;  Surgeon: Maryruth Ole DASEN, MD;  Location: [facility];  Service: Endoscopy;  Laterality: N/A;   ELBOW FRACTURE SURGERY  1995   ESOPHAGOGASTRODUODENOSCOPY (EGD) WITH PROPOFOL  N/A 05/31/2021   Procedure: ESOPHAGOGASTRODUODENOSCOPY (EGD) WITH PROPOFOL ;  Surgeon: Maryruth Ole DASEN, MD;  Location: [facility];  Service: Endoscopy;  Laterality: N/A;   FRACTURE SURGERY     left ankle complete repair, left elbow   HERNIA  REPAIR     incisional   LAPAROSCOPIC GASTRIC SLEEVE RESECTION  2014   PARATHYROIDECTOMY N/A 10/25/2018   Procedure: PARATHYROIDECTOMY, DIABETIC, SLEEP APNEA;  Surgeon: Marolyn Nest, MD;  Location: [facility];  Service: General;  Laterality: N/A;   TONSILLECTOMY AND ADENOIDECTOMY N/A 12/08/2015   Procedure: TONSILLECTOMY AND ADENOIDECTOMY;  Surgeon: Chinita Hasten, MD;  Location: [facility];  Service: ENT;  Laterality: N/A;   TOTAL HIP ARTHROPLASTY Left 09/22/2022   Procedure: Left posterior total hip arthroplasty;  Surgeon: Lorelle Hussar, MD;  Location: [facility];  Service: Orthopedics;  Laterality: Left;   TUBAL LIGATION     Patient Active Problem List   Diagnosis Date Noted   Osteoarthritis of left hip 09/22/2022   Side pain 08/16/2022   Musculoskeletal pain 08/16/2022   Varicose veins of both legs with edema 01/05/2021   Swelling of limb 12/30/2020   Diabetes (HCC) 12/30/2020   Lymphedema 12/30/2020   S/P subtotal parathyroidectomy 11/08/2018   Hyperparathyroidism (HCC) 06/20/2018   Thyroid  nodule 06/20/2018   Intractable pain 04/12/2015    PCP:  Ophelia Sage, MD  REFERRING PROVIDER: Norleen Gavel, MD  REFERRING DIAG:  [id number] (ICD-10-CM) - Left shoulder pain  M25.562 (ICD-10-CM) - Left knee pain    THERAPY DIAG:  Muscle weakness (generalized)  Acute pain of left knee  Acute pain of left shoulder  Rationale for Evaluation and Treatment: Rehabilitation  ONSET DATE: 08/30/23  SUBJECTIVE:                                                                                                                                                                                      SUBJECTIVE STATEMENT:   Pt reports increased numbness in the L UE and that has been going on for several days.  Pt reports she is planning on having L RTC repair on 11/29/23.     Hand dominance: Right  PERTINENT HISTORY: Pt was seen at [facility] ED on 08/30/23 after a fall sustained at work. Pt works in the  emergency room and reported that she slipped on a wet spot and fell onto her left shoulder and knee to avoid falling onto her L hip. Per ED note: Physical exam is generally reassuring. Shoulder joint itself has normal mobility and I am not concerned about dislocation. No evidence of clavicular injury. Mild proximal humerus fracture is possible and the patient is concerned about the fact that she has had multiple prior orthopedic injuries/fractures due to relatively minor falls. We will proceed with x-rays and anticipate restricted movement with shoulder sling, RICE, etc. Pt underwent L THA last June and has previous history of elbow surgery that prevents full movement of L wrist. Pt reports that she wore the sling provided in ED for a couple of weeks after the fall but has not worn it since.  PMH includes anemia, arthritis (back, L elbow, L hip, L ankle), asthma, DM, GERD, headache, hyperparathyroidism, lymphedema, seizure, sleep apnea  PAIN: Reported on L shoulder: Are you having pain? Yes: NPRS scale: 3/10 currently, worst 10/10, best  Pain location: anterior shoulder Pain description: Electric shocks, sharp Aggravating factors: reaching overhead/behind head, movement Relieving factors: Voltaren, Biofreeze, lidocaine  patches, rest  PRECAUTIONS: None  RED FLAGS: None   WEIGHT BEARING RESTRICTIONS: No  FALLS:  Has patient fallen in last 6 months? Yes. Number of falls 1- pt reports that this incident is her only fall  LIVING ENVIRONMENT: Lives with: lives with their spouse Lives in: Mobile home Stairs: Yes: External: 4 steps; can reach both Has following equipment at home: Single point cane and Walker - 2 wheeled  OCCUPATION: Works in ED at [employer]- pt reports that the pain hasn't affected her job duties that much because she is R handed  PLOF: Independent  PATIENT GOALS: Pt wants to be able to hold her 73 month old grandson, get range of motion back to use her L arm normally.  NEXT  MD VISIT: 10/12/23  OBJECTIVE:  Note: Objective measures were completed at Evaluation unless otherwise noted.  DIAGNOSTIC FINDINGS:  DG Humerus L (08/30/23): FINDINGS: There is no evidence of an acute fracture or dislocation. Radiopaque surgical screws and fixation wires are seen within the left olecranon process and left radial head. Soft tissues are unremarkable.   IMPRESSION: 1. No acute osseous abnormality. 2. Postoperative changes of the left elbow.   PATIENT SURVEYS:   QuickDASH Score: 68.2 / 100 = 68.2 %  LEFS: 31/80  COGNITION: Overall cognitive status: Within functional limits for tasks assessed     SENSATION: WFL- bilateral UE and LE  POSTURE: Pt holds L UE in guarded position at rest. L shoulder shifted anterior causing forward head/rounded shoulders  UPPER EXTREMITY ROM:   Active ROM Right eval Left eval  Shoulder flexion 155 100  Shoulder extension    Shoulder abduction 120 70  Shoulder adduction    Shoulder internal rotation 75 75  Shoulder external rotation 50 35  Elbow flexion    Elbow extension    Wrist flexion    Wrist extension    Wrist ulnar deviation    Wrist radial deviation    Wrist pronation    Wrist supination    (Blank rows = not tested)  UPPER EXTREMITY MMT:  MMT Right eval Left eval  Shoulder flexion 4 NT  Shoulder extension    Shoulder abduction 3+ NT  Shoulder adduction    Shoulder internal rotation    Shoulder external rotation    Middle trapezius    Lower trapezius    Elbow flexion 4 3-  Elbow extension 4 4  Wrist flexion    Wrist extension    Wrist ulnar deviation    Wrist radial deviation    Wrist pronation    Wrist supination    Grip strength (lbs)    (Blank rows = not tested)  LOWER EXTREMITY MMT:     Right Left  Hip flexion 4- 4-  Hip Abduction 4+ 4+  Hip Adduction 4+ 4+  Knee Extension  3+ 3+*  Knee Flexion 4+ 4  DF 4+ 4  PF 5 5     SHOULDER SPECIAL TESTS: Impingement tests: Painful arc  test: positive    PALPATION:  Painful on light palpation of anterior shoulder at greater tubercle; pt is highly sensitive to even light touch of shoulder girdle                                                                                                                             TREATMENT DATE: 11/15/23   Manual:   Supine L shoulder GHJ mobilizations, Grades I-II for pain modulation, 30 sec bouts each direction, PA/AP/Inferior glides, x4 bouts Supine gentle long axis distraction of the L shoulder for increased joint spacing of the L GHJ and pain m odulation Supine PROM of the shoulder in all planes of mobility and pt noting pain reduction with passive ER Supine STM to L upper trap  region to increase extensibility of the musculature   TherEx:  Supine rhythmic stabilization exercises, light pressure utilized to prevent increased pain, 30 sec bouts, x2 Standing shoulder isometrics (flexion, extension, abduction, adduction) with physioball, 2-3 sec holds each direction, 2x10 each    PATIENT EDUCATION: Education details: Pt provided education on healing timeline/inflammatory response and impact of heightened nervous system on pain response Person educated: Patient Education method: Explanation Education comprehension: verbalized understanding  HOME EXERCISE PROGRAM: Access Code: [email or web address] URL: [email or web address] Date: 10/18/2023 Prepared by: Reyes London  Exercises - Seated Hamstring Stretch  - 1-2 x daily - 3 sets - 30 hold - Seated Knee Flexion Stretch  - 1-2 x daily - 3 sets - 30 sec hold   Access Code: [email or web address]  URL: [email or web address]  Date: 10/05/2023  Prepared by: Marina  Moser  Exercises  - Standing shoulder flexion wall slides  - 1 x daily - 7 x weekly - 2 sets - 10 reps - 5 hold  - Standing Shoulder Abduction Slides at Wall  - 1 x daily - 7 x weekly - 2 sets - 10 reps - 5 hold  - Standing Isometric Shoulder Flexion with  Doorway - Arm Bent  - 1 x daily - 7 x weekly - 2 sets - 10 reps - 5 hold  - Standing Isometric Shoulder Abduction with Doorway - Arm Bent  - 1 x daily - 7 x weekly - 2 sets - 10 reps - 5 hold  - Standing Isometric Shoulder Internal Rotation at Doorway  - 1 x daily - 7 x weekly - 2 sets - 10 reps - 5 hold  - Standing Isometric Shoulder Extension with Doorway - Arm Bent  - 1 x daily - 7 x weekly - 2 sets - 10 reps - 5 hold  - Seated Shoulder Pendulum Exercise  - 1 x daily - 7 x weekly - 2 sets - 10 reps - 5 hold   ASSESSMENT:  CLINICAL IMPRESSION:    Pt responded well to the exercises given without an increase in pain.  Pt still noted to have significant TP's located in the biceps and was treated with release techniques.  Pt noted to feel a reduction in pain and increased mobility following the treatment session.  Pt is back in the AM for 10th visit progress note.   Pt will continue to benefit from skilled therapy to address remaining deficits in order to improve overall QoL and return to PLOF.         OBJECTIVE IMPAIRMENTS: Abnormal gait, decreased activity tolerance, decreased endurance, decreased mobility, difficulty walking, decreased ROM, decreased strength, hypomobility, increased edema, impaired perceived functional ability, impaired flexibility, impaired UE functional use, improper body mechanics, postural dysfunction, and pain.   ACTIVITY LIMITATIONS: carrying, lifting, bending, sitting, standing, squatting, stairs, transfers, bed mobility, bathing, dressing, self feeding, reach over head, hygiene/grooming, locomotion level, and caring for others  PARTICIPATION LIMITATIONS: meal prep, cleaning, laundry, driving, shopping, community activity, occupation, and yard work  PERSONAL FACTORS: Age, Fitness, Profession, and 3+ comorbidities: anemia, arthritis (back, L elbow, L hip, L ankle), asthma, DM, GERD, headache, hyperparathyroidism, lymphedema, seizure, sleep apnea are also affecting  patient's functional outcome.   REHAB POTENTIAL: Good  CLINICAL DECISION MAKING: Evolving/moderate complexity  EVALUATION COMPLEXITY: Moderate   GOALS: Goals reviewed with patient? Yes  SHORT TERM GOALS: Target date: 10/24/2023   Patient will be independent in home exercise program to improve strength/mobility for better functional independence with ADLs. Baseline:See HEP above Goal status: INITIAL  LONG TERM GOALS: Target date: 11/28/2023   Patient will decrease Quick DASH score by > 8 points demonstrating reduced self-reported upper extremity disability. Baseline: 68.2/100 Goal status: INITIAL  2.  Patient will improve LEFS score by >9 points to align with MCID value and demonstrate improved functional mobility and increased tolerance with ADLs. Baseline: 31/80 Goal status: INITIAL  3.  Patient will improve L shoulder AROM to > 140 degrees of flexion, scaption, and abduction for improved ability to perform overhead activities. Baseline: see AROM chart above Goal status: INITIAL  4.  Patient will report a worst pain of 6/10 on VAS in L shoulder to improve tolerance with ADLs and reduced symptoms with activities. Baseline: 10/10 worst pain at eval Goal status: INITIAL  5.  Patient will be able to tolerate holding her young grandson without an increase in L shoulder pain to show improved L UE function and increase pt's ability to participate in family care. Baseline: pt unable to hold grandson at eval Goal status: INITIAL  6. Patient (< 77 years old) will complete five times sit to stand test in < 10 seconds indicating an increased LE strength and improved balance. Baseline: 20.56 seconds on 7/10 Goal status: INITIAL  PLAN:  PT FREQUENCY: 2x/week  PT DURATION: 8 weeks  PLANNED INTERVENTIONS: 97164- PT Re-evaluation, 97750- Physical Performance Testing, 97110-Therapeutic exercises, 97530- Therapeutic activity, [id number]- Neuromuscular re-education, 97535- Self Care,  97140- Manual therapy, [id number]- Gait training, 02239- Orthotic Initial, [id number]- Orthotic/Prosthetic subsequent, [id number]- Canalith repositioning, 02239- Splinting, 02967- Electrical stimulation (manual), [id number]- Ultrasound, [id number]- Traction (mechanical), [id number]- Parrafin, 79439 (1-2 muscles), 20561 (3+ muscles)- Dry Needling, Patient/Family education, Balance training, Stair training, Taping, Joint mobilization, Spinal mobilization, Scar mobilization, Vestibular training, DME instructions, Cryotherapy, and Moist heat  PLAN FOR NEXT SESSION:   Progress L UE AROM/PROM, L knee meniscal testing, LE strengthening exercises, gentle L shoulder mobilizations **PROGRESS NOTE**   Fonda Simpers, PT, DPT Physical Therapist - [facility]  11/15/23, 8:04 AM

## 2023-11-16 ENCOUNTER — Ambulatory Visit

## 2023-11-16 NOTE — Therapy (Incomplete)
 OUTPATIENT PHYSICAL THERAPY SHOULDER/KNEE TREATMENT/PHYSICAL THERAPY PROGRESS NOTE   Dates of reporting period  10/03/2023   to   11/16/23    Patient Name: Ashley [name] MRN: [id number] DOB:1965/05/05, 58 y.o., female Today's Date: 11/16/2023  END OF SESSION:         Past Medical History:  Diagnosis Date   Allergy    Anemia    Arthritis    back, left elbow, left hip, left ankle    Asthma    Blood transfusion without reported diagnosis    Complication of anesthesia    delayed emergence   Diabetes mellitus without complication (HCC)    diet controlled   Diverticulosis    GERD (gastroesophageal reflux disease)    Headache    History of hiatal hernia    History of kidney stones    Hyperparathyroidism (HCC)    Lymphedema    PONV (postoperative nausea and vomiting)    S/P subtotal parathyroidectomy    Seizure (HCC)    as a child   Sleep apnea    cannot tolerate mask   Thyroid  nodule 06/20/2018   Varicose vein of leg    Past Surgical History:  Procedure Laterality Date   ACETABULUM FRACTURE SURGERY Left    CESAREAN SECTION     CHOLECYSTECTOMY     COLONOSCOPY WITH PROPOFOL  N/A 05/31/2021   Procedure: COLONOSCOPY WITH PROPOFOL ;  Surgeon: Maryruth Ole DASEN, MD;  Location: [facility];  Service: Endoscopy;  Laterality: N/A;   ELBOW FRACTURE SURGERY  1995   ESOPHAGOGASTRODUODENOSCOPY (EGD) WITH PROPOFOL  N/A 05/31/2021   Procedure: ESOPHAGOGASTRODUODENOSCOPY (EGD) WITH PROPOFOL ;  Surgeon: Maryruth Ole DASEN, MD;  Location: [facility];  Service: Endoscopy;  Laterality: N/A;   FRACTURE SURGERY     left ankle complete repair, left elbow   HERNIA REPAIR     incisional   LAPAROSCOPIC GASTRIC SLEEVE RESECTION  2014   PARATHYROIDECTOMY N/A 10/25/2018   Procedure: PARATHYROIDECTOMY, DIABETIC, SLEEP APNEA;  Surgeon: Marolyn Nest, MD;  Location: [facility];  Service: General;  Laterality: N/A;   TONSILLECTOMY AND ADENOIDECTOMY N/A 12/08/2015    Procedure: TONSILLECTOMY AND ADENOIDECTOMY;  Surgeon: Chinita Hasten, MD;  Location: [facility];  Service: ENT;  Laterality: N/A;   TOTAL HIP ARTHROPLASTY Left 09/22/2022   Procedure: Left posterior total hip arthroplasty;  Surgeon: Lorelle Hussar, MD;  Location: [facility];  Service: Orthopedics;  Laterality: Left;   TUBAL LIGATION     Patient Active Problem List   Diagnosis Date Noted   Osteoarthritis of left hip 09/22/2022   Side pain 08/16/2022   Musculoskeletal pain 08/16/2022   Varicose veins of both legs with edema 01/05/2021   Swelling of limb 12/30/2020   Diabetes (HCC) 12/30/2020   Lymphedema 12/30/2020   S/P subtotal parathyroidectomy 11/08/2018   Hyperparathyroidism (HCC) 06/20/2018   Thyroid  nodule 06/20/2018   Intractable pain 04/12/2015    PCP:  Ophelia Sage, MD  REFERRING PROVIDER: Norleen Gavel, MD  REFERRING DIAG:  [phone] (ICD-10-CM) - Left shoulder pain  M25.562 (ICD-10-CM) - Left knee pain    THERAPY DIAG:  No diagnosis found.  Rationale for Evaluation and Treatment: Rehabilitation  ONSET DATE: 08/30/23  SUBJECTIVE:  SUBJECTIVE STATEMENT:  ***   Hand dominance: Right  PERTINENT HISTORY: Pt was seen at [facility] ED on 08/30/23 after a fall sustained at work. Pt works in the emergency room and reported that she slipped on a wet spot and fell onto her left shoulder and knee to avoid falling onto her L hip. Per ED note: Physical exam is generally reassuring. Shoulder joint itself has normal mobility and I am not concerned about dislocation. No evidence of clavicular injury. Mild proximal humerus fracture is possible and the patient is concerned about the fact that she has had multiple prior orthopedic injuries/fractures due to relatively minor falls. We will proceed with x-rays and  anticipate restricted movement with shoulder sling, RICE, etc. Pt underwent L THA last June and has previous history of elbow surgery that prevents full movement of L wrist. Pt reports that she wore the sling provided in ED for a couple of weeks after the fall but has not worn it since.  PMH includes anemia, arthritis (back, L elbow, L hip, L ankle), asthma, DM, GERD, headache, hyperparathyroidism, lymphedema, seizure, sleep apnea  PAIN: Reported on L shoulder: Are you having pain? Yes: NPRS scale: 3/10 currently, worst 10/10, best  Pain location: anterior shoulder Pain description: Electric shocks, sharp Aggravating factors: reaching overhead/behind head, movement Relieving factors: Voltaren, Biofreeze, lidocaine  patches, rest  PRECAUTIONS: None  RED FLAGS: None   WEIGHT BEARING RESTRICTIONS: No  FALLS:  Has patient fallen in last 6 months? Yes. Number of falls 1- pt reports that this incident is her only fall  LIVING ENVIRONMENT: Lives with: lives with their spouse Lives in: Mobile home Stairs: Yes: External: 4 steps; can reach both Has following equipment at home: Single point cane and Walker - 2 wheeled  OCCUPATION: Works in ED at [employer]- pt reports that the pain hasn't affected her job duties that much because she is R handed  PLOF: Independent  PATIENT GOALS: Pt wants to be able to hold her 17 month old grandson, get range of motion back to use her L arm normally.  NEXT MD VISIT: 10/12/23  OBJECTIVE:  Note: Objective measures were completed at Evaluation unless otherwise noted.  DIAGNOSTIC FINDINGS:  DG Humerus L (08/30/23): FINDINGS: There is no evidence of an acute fracture or dislocation. Radiopaque surgical screws and fixation wires are seen within the left olecranon process and left radial head. Soft tissues are unremarkable.   IMPRESSION: 1. No acute osseous abnormality. 2. Postoperative changes of the left elbow.   PATIENT SURVEYS:   QuickDASH Score:  68.2 / 100 = 68.2 %  LEFS: 31/80  COGNITION: Overall cognitive status: Within functional limits for tasks assessed     SENSATION: WFL- bilateral UE and LE  POSTURE: Pt holds L UE in guarded position at rest. L shoulder shifted anterior causing forward head/rounded shoulders  UPPER EXTREMITY ROM:   Active ROM Right eval Left eval  Shoulder flexion 155 100  Shoulder extension    Shoulder abduction 120 70  Shoulder adduction    Shoulder internal rotation 75 75  Shoulder external rotation 50 35  Elbow flexion    Elbow extension    Wrist flexion    Wrist extension    Wrist ulnar deviation    Wrist radial deviation    Wrist pronation    Wrist supination    (Blank rows = not tested)  UPPER EXTREMITY MMT:  MMT Right eval Left eval  Shoulder flexion 4 NT  Shoulder extension    Shoulder abduction  3+ NT  Shoulder adduction    Shoulder internal rotation    Shoulder external rotation    Middle trapezius    Lower trapezius    Elbow flexion 4 3-  Elbow extension 4 4  Wrist flexion    Wrist extension    Wrist ulnar deviation    Wrist radial deviation    Wrist pronation    Wrist supination    Grip strength (lbs)    (Blank rows = not tested)  LOWER EXTREMITY MMT:     Right Left  Hip flexion 4- 4-  Hip Abduction 4+ 4+  Hip Adduction 4+ 4+  Knee Extension  3+ 3+*  Knee Flexion 4+ 4  DF 4+ 4  PF 5 5     SHOULDER SPECIAL TESTS: Impingement tests: Painful arc test: positive    PALPATION:  Painful on light palpation of anterior shoulder at greater tubercle; pt is highly sensitive to even light touch of shoulder girdle                                                                                                                             TREATMENT DATE: 11/16/23  *** Manual:   Supine L shoulder GHJ mobilizations, Grades I-II for pain modulation, 30 sec bouts each direction, PA/AP/Inferior glides, x4 bouts Supine gentle long axis distraction of the L  shoulder for increased joint spacing of the L GHJ and pain m odulation Supine PROM of the shoulder in all planes of mobility and pt noting pain reduction with passive ER Supine STM to L upper trap region to increase extensibility of the musculature   TherEx:  Supine rhythmic stabilization exercises, light pressure utilized to prevent increased pain, 30 sec bouts, x2 Standing shoulder isometrics (flexion, extension, abduction, adduction) with physioball, 2-3 sec holds each direction, 2x10 each    PATIENT EDUCATION: Education details: Pt provided education on healing timeline/inflammatory response and impact of heightened nervous system on pain response Person educated: Patient Education method: Explanation Education comprehension: verbalized understanding  HOME EXERCISE PROGRAM: Access Code: [email or web address] URL: [email or web address] Date: 10/18/2023 Prepared by: Reyes London  Exercises - Seated Hamstring Stretch  - 1-2 x daily - 3 sets - 30 hold - Seated Knee Flexion Stretch  - 1-2 x daily - 3 sets - 30 sec hold   Access Code: [email or web address]  URL: [email or web address]  Date: 10/05/2023  Prepared by: Marina  Moser  Exercises  - Standing shoulder flexion wall slides  - 1 x daily - 7 x weekly - 2 sets - 10 reps - 5 hold  - Standing Shoulder Abduction Slides at Wall  - 1 x daily - 7 x weekly - 2 sets - 10 reps - 5 hold  - Standing Isometric Shoulder Flexion with Doorway - Arm Bent  - 1 x daily - 7 x weekly - 2 sets - 10 reps - 5 hold  - Standing Isometric Shoulder Abduction with Doorway - Arm Bent  - 1 x  daily - 7 x weekly - 2 sets - 10 reps - 5 hold  - Standing Isometric Shoulder Internal Rotation at Doorway  - 1 x daily - 7 x weekly - 2 sets - 10 reps - 5 hold  - Standing Isometric Shoulder Extension with Doorway - Arm Bent  - 1 x daily - 7 x weekly - 2 sets - 10 reps - 5 hold  - Seated Shoulder Pendulum Exercise  - 1 x daily - 7 x weekly - 2 sets - 10 reps  - 5 hold   ASSESSMENT:  CLINICAL IMPRESSION:    ***       OBJECTIVE IMPAIRMENTS: Abnormal gait, decreased activity tolerance, decreased endurance, decreased mobility, difficulty walking, decreased ROM, decreased strength, hypomobility, increased edema, impaired perceived functional ability, impaired flexibility, impaired UE functional use, improper body mechanics, postural dysfunction, and pain.   ACTIVITY LIMITATIONS: carrying, lifting, bending, sitting, standing, squatting, stairs, transfers, bed mobility, bathing, dressing, self feeding, reach over head, hygiene/grooming, locomotion level, and caring for others  PARTICIPATION LIMITATIONS: meal prep, cleaning, laundry, driving, shopping, community activity, occupation, and yard work  PERSONAL FACTORS: Age, Fitness, Profession, and 3+ comorbidities: anemia, arthritis (back, L elbow, L hip, L ankle), asthma, DM, GERD, headache, hyperparathyroidism, lymphedema, seizure, sleep apnea are also affecting patient's functional outcome.   REHAB POTENTIAL: Good  CLINICAL DECISION MAKING: Evolving/moderate complexity  EVALUATION COMPLEXITY: Moderate   GOALS: Goals reviewed with patient? Yes  SHORT TERM GOALS: Target date: 10/24/2023  Patient will be independent in home exercise program to improve strength/mobility for better functional independence with ADLs. Baseline:See HEP above Goal status: INITIAL   LONG TERM GOALS: Target date: 11/28/2023  Patient will decrease Quick DASH score by > 8 points demonstrating reduced self-reported upper extremity disability. Baseline: 68.2/100 Goal status: INITIAL  2.  Patient will improve LEFS score by >9 points to align with MCID value and demonstrate improved functional mobility and increased tolerance with ADLs. Baseline: 31/80 Goal status: INITIAL  3.  Patient will improve L shoulder AROM to > 140 degrees of flexion, scaption, and abduction for improved ability to perform overhead  activities. Baseline: see AROM chart above Goal status: INITIAL  4.  Patient will report a worst pain of 6/10 on VAS in L shoulder to improve tolerance with ADLs and reduced symptoms with activities. Baseline: 10/10 worst pain at eval Goal status: INITIAL  5.  Patient will be able to tolerate holding her young grandson without an increase in L shoulder pain to show improved L UE function and increase pt's ability to participate in family care. Baseline: pt unable to hold grandson at eval Goal status: INITIAL  6. Patient (< 6 years old) will complete five times sit to stand test in < 10 seconds indicating an increased LE strength and improved balance. Baseline: 20.56 seconds on 7/10 Goal status: INITIAL  PLAN:  PT FREQUENCY: 2x/week  PT DURATION: 8 weeks  PLANNED INTERVENTIONS: [id number]- PT Re-evaluation, 97750- Physical Performance Testing, 97110-Therapeutic exercises, 97530- Therapeutic activity, [id number]- Neuromuscular re-education, 97535- Self Care, [id number]- Manual therapy, [id number]- Gait training, [id number]- Orthotic Initial, [id number]- Orthotic/Prosthetic subsequent, [phone]- Canalith repositioning, [id number]- Splinting, [id number]- Electrical stimulation (manual), [id number]- Ultrasound, [id number]- Traction (mechanical), [id number]- Parrafin, [id number] (1-2 muscles), [id number] (3+ muscles)- Dry Needling, Patient/Family education, Balance training, Stair training, Taping, Joint mobilization, Spinal mobilization, Scar mobilization, Vestibular training, DME instructions, Cryotherapy, and Moist heat  PLAN FOR NEXT SESSION:   Progress L UE AROM/PROM, L knee meniscal testing, LE strengthening exercises, gentle L shoulder mobilizations **  PROGRESS NOTE**   Fonda Simpers, PT, DPT Physical Therapist - [place]  [facility]  11/16/23, 7:13 AM

## 2023-11-16 NOTE — Therapy (Signed)
 OUTPATIENT PHYSICAL THERAPY SHOULDER/KNEE TREATMENT/PHYSICAL THERAPY PROGRESS NOTE   Dates of reporting period  10/03/2023   to   11/17/23    Patient Name: Ashley [name] MRN: [id number] DOB:08-12-1965, 58 y.o., female Today's Date: 11/19/2023  END OF SESSION:  PT End of Session - 11/19/23 2054     Visit Number 10    Number of Visits 16    Date for PT Re-Evaluation 11/28/23    Authorization Type 8 approved WC treatments    Progress Note Due on Visit 20    PT Start Time 0853    PT Stop Time 0929    PT Time Calculation (min) 36 min    Activity Tolerance Patient tolerated treatment well;Patient limited by pain    Behavior During Therapy [facility] for tasks assessed/performed                 Past Medical History:  Diagnosis Date   Allergy    Anemia    Arthritis    back, left elbow, left hip, left ankle    Asthma    Blood transfusion without reported diagnosis    Complication of anesthesia    delayed emergence   Diabetes mellitus without complication (HCC)    diet controlled   Diverticulosis    GERD (gastroesophageal reflux disease)    Headache    History of hiatal hernia    History of kidney stones    Hyperparathyroidism (HCC)    Lymphedema    PONV (postoperative nausea and vomiting)    S/P subtotal parathyroidectomy    Seizure (HCC)    as a child   Sleep apnea    cannot tolerate mask   Thyroid  nodule 06/20/2018   Varicose vein of leg    Past Surgical History:  Procedure Laterality Date   ACETABULUM FRACTURE SURGERY Left    CESAREAN SECTION     CHOLECYSTECTOMY     COLONOSCOPY WITH PROPOFOL  N/A 05/31/2021   Procedure: COLONOSCOPY WITH PROPOFOL ;  Surgeon: Maryruth Ole DASEN, MD;  Location: [facility];  Service: Endoscopy;  Laterality: N/A;   ELBOW FRACTURE SURGERY  1995   ESOPHAGOGASTRODUODENOSCOPY (EGD) WITH PROPOFOL  N/A 05/31/2021   Procedure: ESOPHAGOGASTRODUODENOSCOPY (EGD) WITH PROPOFOL ;  Surgeon: Maryruth Ole DASEN, MD;  Location:  [facility];  Service: Endoscopy;  Laterality: N/A;   FRACTURE SURGERY     left ankle complete repair, left elbow   HERNIA REPAIR     incisional   LAPAROSCOPIC GASTRIC SLEEVE RESECTION  2014   PARATHYROIDECTOMY N/A 10/25/2018   Procedure: PARATHYROIDECTOMY, DIABETIC, SLEEP APNEA;  Surgeon: Marolyn Nest, MD;  Location: [facility];  Service: General;  Laterality: N/A;   TONSILLECTOMY AND ADENOIDECTOMY N/A 12/08/2015   Procedure: TONSILLECTOMY AND ADENOIDECTOMY;  Surgeon: Chinita Hasten, MD;  Location: [facility];  Service: ENT;  Laterality: N/A;   TOTAL HIP ARTHROPLASTY Left 09/22/2022   Procedure: Left posterior total hip arthroplasty;  Surgeon: Lorelle Hussar, MD;  Location: [facility];  Service: Orthopedics;  Laterality: Left;   TUBAL LIGATION     Patient Active Problem List   Diagnosis Date Noted   Osteoarthritis of left hip 09/22/2022   Side pain 08/16/2022   Musculoskeletal pain 08/16/2022   Varicose veins of both legs with edema 01/05/2021   Swelling of limb 12/30/2020   Diabetes (HCC) 12/30/2020   Lymphedema 12/30/2020   S/P subtotal parathyroidectomy 11/08/2018   Hyperparathyroidism (HCC) 06/20/2018   Thyroid  nodule 06/20/2018   Intractable pain 04/12/2015    PCP:  Ophelia Sage, MD  REFERRING PROVIDER:  Norleen Gavel, MD  REFERRING DIAG:  [phone] (ICD-10-CM) - Left shoulder pain  M25.562 (ICD-10-CM) - Left knee pain    THERAPY DIAG:  Muscle weakness (generalized)  Acute pain of left knee  Acute pain of left shoulder  Rationale for Evaluation and Treatment: Rehabilitation  ONSET DATE: 08/30/23  SUBJECTIVE:                                                                                                                                                                                      SUBJECTIVE STATEMENT:  Surgery scheduled for 11/29/2023- L shoulder RCR Having more knee pain since injection on 11/02/2023.     Hand dominance: Right  PERTINENT HISTORY: Pt was  seen at [facility] ED on 08/30/23 after a fall sustained at work. Pt works in the emergency room and reported that she slipped on a wet spot and fell onto her left shoulder and knee to avoid falling onto her L hip. Per ED note: Physical exam is generally reassuring. Shoulder joint itself has normal mobility and I am not concerned about dislocation. No evidence of clavicular injury. Mild proximal humerus fracture is possible and the patient is concerned about the fact that she has had multiple prior orthopedic injuries/fractures due to relatively minor falls. We will proceed with x-rays and anticipate restricted movement with shoulder sling, RICE, etc. Pt underwent L THA last June and has previous history of elbow surgery that prevents full movement of L wrist. Pt reports that she wore the sling provided in ED for a couple of weeks after the fall but has not worn it since.  PMH includes anemia, arthritis (back, L elbow, L hip, L ankle), asthma, DM, GERD, headache, hyperparathyroidism, lymphedema, seizure, sleep apnea  PAIN: Reported on L shoulder: Are you having pain? Yes: NPRS scale: 3/10 currently, worst 10/10, best  Pain location: anterior shoulder Pain description: Electric shocks, sharp Aggravating factors: reaching overhead/behind head, movement Relieving factors: Voltaren, Biofreeze, lidocaine  patches, rest  PRECAUTIONS: None  RED FLAGS: None   WEIGHT BEARING RESTRICTIONS: No  FALLS:  Has patient fallen in last 6 months? Yes. Number of falls 1- pt reports that this incident is her only fall  LIVING ENVIRONMENT: Lives with: lives with their spouse Lives in: Mobile home Stairs: Yes: External: 4 steps; can reach both Has following equipment at home: Single point cane and Walker - 2 wheeled  OCCUPATION: Works in ED at [employer]- pt reports that the pain hasn't affected her job duties that much because she is R handed  PLOF: Independent  PATIENT GOALS: Pt wants to be able to hold her 80 month  old grandson, get range of motion back  to use her L arm normally.  NEXT MD VISIT: 10/12/23  OBJECTIVE:  Note: Objective measures were completed at Evaluation unless otherwise noted.  DIAGNOSTIC FINDINGS:  DG Humerus L (08/30/23): FINDINGS: There is no evidence of an acute fracture or dislocation. Radiopaque surgical screws and fixation wires are seen within the left olecranon process and left radial head. Soft tissues are unremarkable.   IMPRESSION: 1. No acute osseous abnormality. 2. Postoperative changes of the left elbow.   PATIENT SURVEYS:   QuickDASH Score: 68.2 / 100 = 68.2 %  LEFS: 31/80  COGNITION: Overall cognitive status: Within functional limits for tasks assessed     SENSATION: WFL- bilateral UE and LE  POSTURE: Pt holds L UE in guarded position at rest. L shoulder shifted anterior causing forward head/rounded shoulders  UPPER EXTREMITY ROM:   Active ROM Right eval Left eval  Shoulder flexion 155 100  Shoulder extension    Shoulder abduction 120 70  Shoulder adduction    Shoulder internal rotation 75 75  Shoulder external rotation 50 35  Elbow flexion    Elbow extension    Wrist flexion    Wrist extension    Wrist ulnar deviation    Wrist radial deviation    Wrist pronation    Wrist supination    (Blank rows = not tested)  UPPER EXTREMITY MMT:  MMT Right eval Left eval  Shoulder flexion 4 NT  Shoulder extension    Shoulder abduction 3+ NT  Shoulder adduction    Shoulder internal rotation    Shoulder external rotation    Middle trapezius    Lower trapezius    Elbow flexion 4 3-  Elbow extension 4 4  Wrist flexion    Wrist extension    Wrist ulnar deviation    Wrist radial deviation    Wrist pronation    Wrist supination    Grip strength (lbs)    (Blank rows = not tested)  LOWER EXTREMITY MMT:     Right Left  Hip flexion 4- 4-  Hip Abduction 4+ 4+  Hip Adduction 4+ 4+  Knee Extension  3+ 3+*  Knee Flexion 4+ 4  DF 4+ 4   PF 5 5     SHOULDER SPECIAL TESTS: Impingement tests: Painful arc test: positive    PALPATION:  Painful on light palpation of anterior shoulder at greater tubercle; pt is highly sensitive to even light touch of shoulder girdle                                                                                                                             TREATMENT DATE: 11/17/23   Physical therapy treatment session today consisted of completing assessment of goals and administration of testing as demonstrated and documented in flow sheet, treatment, and goals section of this note. Addition treatments may be found below.   Manual:   Supine L shoulder GHJ mobilizations, Grades I-II for pain modulation, 30 sec bouts  each direction, PA/AP/Inferior glides, x4 bouts Supine gentle long axis distraction of the L shoulder for increased joint spacing of the L GHJ and pain modulation Supine PROM of the shoulder in all planes of mobility x several min each (along with mobilization with inf glide w/PROM)    TherEx:  Standing shoulder isometrics (flexion, extension, abduction, ER, IR) with towel, 3-5 sec holds each direction, 10-15 reps each- increased VC to remind her position stances and how to use the wall.  Standing scap retract 2 x 10 using mirror for feedback Standing shoulder post roll 2 x10 reps Standing chin tuck x 5 then perform 5 in supine with 5 sec hold Standing shoulder depress using mirror for feedback x 12 reps    PATIENT EDUCATION: Education details: Exercise technique Person educated: Patient Education method: Explanation Education comprehension: verbalized understanding  HOME EXERCISE PROGRAM: Access Code: [email or web address] URL: [email or web address] Date: 10/18/2023 Prepared by: Reyes London  Exercises - Seated Hamstring Stretch  - 1-2 x daily - 3 sets - 30 hold - Seated Knee Flexion Stretch  - 1-2 x daily - 3 sets - 30 sec hold   Access Code: [email or web address]   URL: [email or web address]  Date: 10/05/2023  Prepared by: Marina  Moser  Exercises  - Standing shoulder flexion wall slides  - 1 x daily - 7 x weekly - 2 sets - 10 reps - 5 hold  - Standing Shoulder Abduction Slides at Wall  - 1 x daily - 7 x weekly - 2 sets - 10 reps - 5 hold  - Standing Isometric Shoulder Flexion with Doorway - Arm Bent  - 1 x daily - 7 x weekly - 2 sets - 10 reps - 5 hold  - Standing Isometric Shoulder Abduction with Doorway - Arm Bent  - 1 x daily - 7 x weekly - 2 sets - 10 reps - 5 hold  - Standing Isometric Shoulder Internal Rotation at Doorway  - 1 x daily - 7 x weekly - 2 sets - 10 reps - 5 hold  - Standing Isometric Shoulder Extension with Doorway - Arm Bent  - 1 x daily - 7 x weekly - 2 sets - 10 reps - 5 hold  - Seated Shoulder Pendulum Exercise  - 1 x daily - 7 x weekly - 2 sets - 10 reps - 5 hold   ASSESSMENT:  CLINICAL IMPRESSION:    Patient presents with continued L shoulder pain. Reviewed some pain-free shoulder/scapular strengthening while patient is awaiting plans for shoulder surgery. She is still very limited with L shoulder ROM and will continue to benefit from pain-free strengthening to prepare her weakness associated with recovery time. Patient's condition has the potential to improve in response to therapy. Maximum improvement is yet to be obtained. The anticipated improvement is attainable and reasonable in a generally predictable time.  Pt will continue to benefit from skilled therapy to address remaining deficits in order to improve overall QoL and return to PLOF.        OBJECTIVE IMPAIRMENTS: Abnormal gait, decreased activity tolerance, decreased endurance, decreased mobility, difficulty walking, decreased ROM, decreased strength, hypomobility, increased edema, impaired perceived functional ability, impaired flexibility, impaired UE functional use, improper body mechanics, postural dysfunction, and pain.   ACTIVITY LIMITATIONS:  carrying, lifting, bending, sitting, standing, squatting, stairs, transfers, bed mobility, bathing, dressing, self feeding, reach over head, hygiene/grooming, locomotion level, and caring for others  PARTICIPATION LIMITATIONS: meal prep, cleaning, laundry, driving, shopping, community activity, occupation, and yard work  PERSONAL FACTORS: Age,  Fitness, Profession, and 3+ comorbidities: anemia, arthritis (back, L elbow, L hip, L ankle), asthma, DM, GERD, headache, hyperparathyroidism, lymphedema, seizure, sleep apnea are also affecting patient's functional outcome.   REHAB POTENTIAL: Good  CLINICAL DECISION MAKING: Evolving/moderate complexity  EVALUATION COMPLEXITY: Moderate   GOALS: Goals reviewed with patient? Yes  SHORT TERM GOALS: Target date: 10/24/2023  Patient will be independent in home exercise program to improve strength/mobility for better functional independence with ADLs. Baseline:See HEP above; 11/17/2023=Patient able to verbalize and today demonstrate good understanding of HEP especially now since she has received the results from MRI.  Goal status: MET   LONG TERM GOALS: Target date: 11/28/2023  Patient will decrease Quick DASH score by > 8 points demonstrating reduced self-reported upper extremity disability. Baseline: 68.2/100; 11/17/2023- will assess next session Goal status: INITIAL  2.  Patient will improve LEFS score by >9 points to align with MCID value and demonstrate improved functional mobility and increased tolerance with ADLs. Baseline: 31/80; 11/17/2023= focusing on Left shoulder to prepare for upcoming Sx- did not issue survey this date Goal status: INITIAL  3.  Patient will improve L shoulder AROM to > 140 degrees of flexion, scaption, and abduction for improved ability to perform overhead activities. Baseline: see AROM chart above; 11/17/2023- avoided AROM in elevated position due to now known Supraspinatus tear. Did work on PROM (pain free) ROM Goal  status: ONGOING  4.  Patient will report a worst pain of 6/10 on VAS in L shoulder to improve tolerance with ADLs and reduced symptoms with activities. Baseline: 10/10 worst pain at eval; 11/17/2023= [facility] report PT session are helping but still up to 10/10 at times. Patient to have surgery for L RCR on 11/29/2023 Goal status: ONGOING  5.  Patient will be able to tolerate holding her young grandson without an increase in L shoulder pain to show improved L UE function and increase pt's ability to participate in family care. Baseline: pt unable to hold grandson at eval; 11/17/2023- Still unable to hold grandson secondary to pain.  Goal status: ONGOING  6. Patient (< 67 years old) will complete five times sit to stand test in < 10 seconds indicating an increased LE strength and improved balance. Baseline: 20.56 seconds on 7/10; 11/17/2023- Did not assess- focusing on shoulder to prepare  Goal status: INITIAL  PLAN:  PT FREQUENCY: 2x/week  PT DURATION: 8 weeks  PLANNED INTERVENTIONS: 97164- PT Re-evaluation, 97750- Physical Performance Testing, 97110-Therapeutic exercises, 97530- Therapeutic activity, [id number]- Neuromuscular re-education, 97535- Self Care, [id number]- Manual therapy, [id number]- Gait training, [id number]- Orthotic Initial, [id number]- Orthotic/Prosthetic subsequent, [phone]- Canalith repositioning, [id number]- Splinting, [id number]- Electrical stimulation (manual), [id number]- Ultrasound, [id number]- Traction (mechanical), [id number]- Parrafin, 20560 (1-2 muscles), 20561 (3+ muscles)- Dry Needling, Patient/Family education, Balance training, Stair training, Taping, Joint mobilization, Spinal mobilization, Scar mobilization, Vestibular training, DME instructions, Cryotherapy, and Moist heat  PLAN FOR NEXT SESSION:   Progress L UE AROM/PROM,  LE strengthening exercises, gentle L shoulder mobilizations    Chyrl London, PT Physical Therapist - [facility]  [facility]  11/19/23, 9:08 PM

## 2023-11-17 ENCOUNTER — Ambulatory Visit

## 2023-11-17 DIAGNOSIS — M6281 Muscle weakness (generalized): Secondary | ICD-10-CM

## 2023-11-17 DIAGNOSIS — M25512 Pain in left shoulder: Secondary | ICD-10-CM

## 2023-11-17 DIAGNOSIS — M25562 Pain in left knee: Secondary | ICD-10-CM

## 2023-11-20 ENCOUNTER — Ambulatory Visit

## 2023-11-22 ENCOUNTER — Ambulatory Visit

## 2023-11-22 DIAGNOSIS — Z01818 Encounter for other preprocedural examination: Secondary | ICD-10-CM | POA: Diagnosis not present

## 2023-11-22 DIAGNOSIS — M25562 Pain in left knee: Secondary | ICD-10-CM | POA: Diagnosis not present

## 2023-11-22 DIAGNOSIS — J4520 Mild intermittent asthma, uncomplicated: Secondary | ICD-10-CM | POA: Diagnosis not present

## 2023-11-22 DIAGNOSIS — M6281 Muscle weakness (generalized): Secondary | ICD-10-CM | POA: Diagnosis not present

## 2023-11-22 DIAGNOSIS — M25512 Pain in left shoulder: Secondary | ICD-10-CM

## 2023-11-22 DIAGNOSIS — E119 Type 2 diabetes mellitus without complications: Secondary | ICD-10-CM | POA: Diagnosis not present

## 2023-11-22 DIAGNOSIS — G4733 Obstructive sleep apnea (adult) (pediatric): Secondary | ICD-10-CM | POA: Diagnosis not present

## 2023-11-22 DIAGNOSIS — D649 Anemia, unspecified: Secondary | ICD-10-CM | POA: Diagnosis not present

## 2023-11-22 NOTE — Therapy (Signed)
 OUTPATIENT PHYSICAL THERAPY SHOULDER/KNEE TREATMENT/PHYSICAL THERAPY PROGRESS NOTE   Dates of reporting period  10/03/2023   to   11/17/23    Patient Name: Ashley [name] MRN: [id number] DOB:07-Oct-1965, 58 y.o., female Today's Date: 11/22/2023  END OF SESSION:  PT End of Session - 11/22/23 0729     Visit Number 11    Number of Visits 16    Date for PT Re-Evaluation 11/28/23    Authorization Type 8 approved WC treatments    Progress Note Due on Visit 20    PT Start Time 0721    PT Stop Time 0800    PT Time Calculation (min) 39 min    Activity Tolerance Patient tolerated treatment well;Patient limited by pain    Behavior During Therapy [facility] for tasks assessed/performed                 Past Medical History:  Diagnosis Date   Allergy    Anemia    Arthritis    back, left elbow, left hip, left ankle    Asthma    Blood transfusion without reported diagnosis    Complication of anesthesia    delayed emergence   Diabetes mellitus without complication (HCC)    diet controlled   Diverticulosis    GERD (gastroesophageal reflux disease)    Headache    History of hiatal hernia    History of kidney stones    Hyperparathyroidism (HCC)    Lymphedema    PONV (postoperative nausea and vomiting)    S/P subtotal parathyroidectomy    Seizure (HCC)    as a child   Sleep apnea    cannot tolerate mask   Thyroid  nodule 06/20/2018   Varicose vein of leg    Past Surgical History:  Procedure Laterality Date   ACETABULUM FRACTURE SURGERY Left    CESAREAN SECTION     CHOLECYSTECTOMY     COLONOSCOPY WITH PROPOFOL  N/A 05/31/2021   Procedure: COLONOSCOPY WITH PROPOFOL ;  Surgeon: Maryruth Ole DASEN, MD;  Location: [facility] ENDOSCOPY;  Service: Endoscopy;  Laterality: N/A;   ELBOW FRACTURE SURGERY  1995   ESOPHAGOGASTRODUODENOSCOPY (EGD) WITH PROPOFOL  N/A 05/31/2021   Procedure: ESOPHAGOGASTRODUODENOSCOPY (EGD) WITH PROPOFOL ;  Surgeon: Maryruth Ole DASEN, MD;  Location:  [facility] ENDOSCOPY;  Service: Endoscopy;  Laterality: N/A;   FRACTURE SURGERY     left ankle complete repair, left elbow   HERNIA REPAIR     incisional   LAPAROSCOPIC GASTRIC SLEEVE RESECTION  2014   PARATHYROIDECTOMY N/A 10/25/2018   Procedure: PARATHYROIDECTOMY, DIABETIC, SLEEP APNEA;  Surgeon: Marolyn Nest, MD;  Location: [facility] ORS;  Service: General;  Laterality: N/A;   TONSILLECTOMY AND ADENOIDECTOMY N/A 12/08/2015   Procedure: TONSILLECTOMY AND ADENOIDECTOMY;  Surgeon: Chinita Hasten, MD;  Location: [facility] ORS;  Service: ENT;  Laterality: N/A;   TOTAL HIP ARTHROPLASTY Left 09/22/2022   Procedure: Left posterior total hip arthroplasty;  Surgeon: Lorelle Hussar, MD;  Location: [facility] ORS;  Service: Orthopedics;  Laterality: Left;   TUBAL LIGATION     Patient Active Problem List   Diagnosis Date Noted   Osteoarthritis of left hip 09/22/2022   Side pain 08/16/2022   Musculoskeletal pain 08/16/2022   Varicose veins of both legs with edema 01/05/2021   Swelling of limb 12/30/2020   Diabetes (HCC) 12/30/2020   Lymphedema 12/30/2020   S/P subtotal parathyroidectomy 11/08/2018   Hyperparathyroidism (HCC) 06/20/2018   Thyroid  nodule 06/20/2018   Intractable pain 04/12/2015    PCP:  Ophelia Sage, MD  REFERRING PROVIDER:  Norleen Gavel, MD  REFERRING DIAG:  [phone] (ICD-10-CM) - Left shoulder pain  M25.562 (ICD-10-CM) - Left knee pain    THERAPY DIAG:  Muscle weakness (generalized)  Acute pain of left knee  Acute pain of left shoulder  Rationale for Evaluation and Treatment: Rehabilitation  ONSET DATE: 08/30/23  SUBJECTIVE:                                                                                                                                                                                      SUBJECTIVE STATEMENT:  Pt reporting 2/10 pain today upon arrival to the clinic.  Surgery scheduled for 11/29/2023- L shoulder RCR     Hand dominance: Right  PERTINENT  HISTORY: Pt was seen at [facility] ED on 08/30/23 after a fall sustained at work. Pt works in the emergency room and reported that she slipped on a wet spot and fell onto her left shoulder and knee to avoid falling onto her L hip. Per ED note: Physical exam is generally reassuring. Shoulder joint itself has normal mobility and I am not concerned about dislocation. No evidence of clavicular injury. Mild proximal humerus fracture is possible and the patient is concerned about the fact that she has had multiple prior orthopedic injuries/fractures due to relatively minor falls. We will proceed with x-rays and anticipate restricted movement with shoulder sling, RICE, etc. Pt underwent L THA last June and has previous history of elbow surgery that prevents full movement of L wrist. Pt reports that she wore the sling provided in ED for a couple of weeks after the fall but has not worn it since.  PMH includes anemia, arthritis (back, L elbow, L hip, L ankle), asthma, DM, GERD, headache, hyperparathyroidism, lymphedema, seizure, sleep apnea  PAIN: Reported on L shoulder: Are you having pain? Yes: NPRS scale: 3/10 currently, worst 10/10, best  Pain location: anterior shoulder Pain description: Electric shocks, sharp Aggravating factors: reaching overhead/behind head, movement Relieving factors: Voltaren, Biofreeze, lidocaine  patches, rest  PRECAUTIONS: None  RED FLAGS: None   WEIGHT BEARING RESTRICTIONS: No  FALLS:  Has patient fallen in last 6 months? Yes. Number of falls 1- pt reports that this incident is her only fall  LIVING ENVIRONMENT: Lives with: lives with their spouse Lives in: Mobile home Stairs: Yes: External: 4 steps; can reach both Has following equipment at home: Single point cane and Walker - 2 wheeled  OCCUPATION: Works in ED at [employer]- pt reports that the pain hasn't affected her job duties that much because she is R handed  PLOF: Independent  PATIENT GOALS: Pt wants to be able to  hold her 73 month old grandson, get range  of motion back to use her L arm normally.  NEXT MD VISIT: 10/12/23  OBJECTIVE:  Note: Objective measures were completed at Evaluation unless otherwise noted.  DIAGNOSTIC FINDINGS:  DG Humerus L (08/30/23): FINDINGS: There is no evidence of an acute fracture or dislocation. Radiopaque surgical screws and fixation wires are seen within the left olecranon process and left radial head. Soft tissues are unremarkable.   IMPRESSION: 1. No acute osseous abnormality. 2. Postoperative changes of the left elbow.   PATIENT SURVEYS:   QuickDASH Score: 68.2 / 100 = 68.2 %  LEFS: 31/80  COGNITION: Overall cognitive status: Within functional limits for tasks assessed     SENSATION: WFL- bilateral UE and LE  POSTURE: Pt holds L UE in guarded position at rest. L shoulder shifted anterior causing forward head/rounded shoulders  UPPER EXTREMITY ROM:   Active ROM Right eval Left eval  Shoulder flexion 155 100  Shoulder extension    Shoulder abduction 120 70  Shoulder adduction    Shoulder internal rotation 75 75  Shoulder external rotation 50 35  Elbow flexion    Elbow extension    Wrist flexion    Wrist extension    Wrist ulnar deviation    Wrist radial deviation    Wrist pronation    Wrist supination    (Blank rows = not tested)  UPPER EXTREMITY MMT:  MMT Right eval Left eval  Shoulder flexion 4 NT  Shoulder extension    Shoulder abduction 3+ NT  Shoulder adduction    Shoulder internal rotation    Shoulder external rotation    Middle trapezius    Lower trapezius    Elbow flexion 4 3-  Elbow extension 4 4  Wrist flexion    Wrist extension    Wrist ulnar deviation    Wrist radial deviation    Wrist pronation    Wrist supination    Grip strength (lbs)    (Blank rows = not tested)  LOWER EXTREMITY MMT:     Right Left  Hip flexion 4- 4-  Hip Abduction 4+ 4+  Hip Adduction 4+ 4+  Knee Extension  3+ 3+*  Knee Flexion  4+ 4  DF 4+ 4  PF 5 5     SHOULDER SPECIAL TESTS: Impingement tests: Painful arc test: positive    PALPATION:  Painful on light palpation of anterior shoulder at greater tubercle; pt is highly sensitive to even light touch of shoulder girdle                                                                                                                             TREATMENT DATE: 11/22/23  Manual:   Supine L shoulder GHJ mobilizations, Grades I-II for pain modulation, 30 sec bouts each direction, PA/AP/Inferior glides, x4 bouts Supine gentle long axis distraction of the L shoulder for increased joint spacing of the L GHJ and pain modulation Supine PROM of the shoulder in all planes of  mobility x several min each (along with mobilization with inf glide w/PROM)  STM with TP release technique applied to the L biceps, UT, and deltoid region for pain modulation and increased tissue extensibility, extensive time spent here with pt noting some radicular symptoms going down to the hand and towards the head with UT.    PATIENT EDUCATION: Education details: Exercise technique Person educated: Patient Education method: Explanation Education comprehension: verbalized understanding  HOME EXERCISE PROGRAM: Access Code: [email or web address] URL: [email or web address] Date: 10/18/2023 Prepared by: Reyes London  Exercises - Seated Hamstring Stretch  - 1-2 x daily - 3 sets - 30 hold - Seated Knee Flexion Stretch  - 1-2 x daily - 3 sets - 30 sec hold   Access Code: [email or web address]  URL: [email or web address]  Date: 10/05/2023  Prepared by: Marina  Moser  Exercises  - Standing shoulder flexion wall slides  - 1 x daily - 7 x weekly - 2 sets - 10 reps - 5 hold  - Standing Shoulder Abduction Slides at Wall  - 1 x daily - 7 x weekly - 2 sets - 10 reps - 5 hold  - Standing Isometric Shoulder Flexion with Doorway - Arm Bent  - 1 x daily - 7 x weekly - 2 sets - 10 reps - 5 hold  -  Standing Isometric Shoulder Abduction with Doorway - Arm Bent  - 1 x daily - 7 x weekly - 2 sets - 10 reps - 5 hold  - Standing Isometric Shoulder Internal Rotation at Doorway  - 1 x daily - 7 x weekly - 2 sets - 10 reps - 5 hold  - Standing Isometric Shoulder Extension with Doorway - Arm Bent  - 1 x daily - 7 x weekly - 2 sets - 10 reps - 5 hold  - Seated Shoulder Pendulum Exercise  - 1 x daily - 7 x weekly - 2 sets - 10 reps - 5 hold   ASSESSMENT:  CLINICAL IMPRESSION:    Pt responded well to the manual therapy treatment.  Pt and therapist discussed current POC and plan for discharge at the next visit prior to having surgery.  Pt understanding and will be prepared for the discharge and the eventual return to therapy following the surgical repair.   Pt will continue to benefit from skilled therapy to address remaining deficits in order to improve overall QoL and return to PLOF.          OBJECTIVE IMPAIRMENTS: Abnormal gait, decreased activity tolerance, decreased endurance, decreased mobility, difficulty walking, decreased ROM, decreased strength, hypomobility, increased edema, impaired perceived functional ability, impaired flexibility, impaired UE functional use, improper body mechanics, postural dysfunction, and pain.   ACTIVITY LIMITATIONS: carrying, lifting, bending, sitting, standing, squatting, stairs, transfers, bed mobility, bathing, dressing, self feeding, reach over head, hygiene/grooming, locomotion level, and caring for others  PARTICIPATION LIMITATIONS: meal prep, cleaning, laundry, driving, shopping, community activity, occupation, and yard work  PERSONAL FACTORS: Age, Fitness, Profession, and 3+ comorbidities: anemia, arthritis (back, L elbow, L hip, L ankle), asthma, DM, GERD, headache, hyperparathyroidism, lymphedema, seizure, sleep apnea are also affecting patient's functional outcome.   REHAB POTENTIAL: Good  CLINICAL DECISION MAKING: Evolving/moderate  complexity  EVALUATION COMPLEXITY: Moderate   GOALS: Goals reviewed with patient? Yes  SHORT TERM GOALS: Target date: 10/24/2023  Patient will be independent in home exercise program to improve strength/mobility for better functional independence with ADLs. Baseline:See HEP above; 11/17/2023=Patient able to verbalize and today demonstrate good understanding of HEP especially now since she  has received the results from MRI.  Goal status: MET   LONG TERM GOALS: Target date: 11/28/2023  Patient will decrease Quick DASH score by > 8 points demonstrating reduced self-reported upper extremity disability. Baseline: 68.2/100; 11/17/2023- will assess next session Goal status: INITIAL  2.  Patient will improve LEFS score by >9 points to align with MCID value and demonstrate improved functional mobility and increased tolerance with ADLs. Baseline: 31/80; 11/17/2023= focusing on Left shoulder to prepare for upcoming Sx- did not issue survey this date Goal status: INITIAL  3.  Patient will improve L shoulder AROM to > 140 degrees of flexion, scaption, and abduction for improved ability to perform overhead activities. Baseline: see AROM chart above; 11/17/2023- avoided AROM in elevated position due to now known Supraspinatus tear. Did work on PROM (pain free) ROM Goal status: ONGOING  4.  Patient will report a worst pain of 6/10 on VAS in L shoulder to improve tolerance with ADLs and reduced symptoms with activities. Baseline: 10/10 worst pain at eval; 11/17/2023= [facility] report PT session are helping but still up to 10/10 at times. Patient to have surgery for L RCR on 11/29/2023 Goal status: ONGOING  5.  Patient will be able to tolerate holding her young grandson without an increase in L shoulder pain to show improved L UE function and increase pt's ability to participate in family care. Baseline: pt unable to hold grandson at eval; 11/17/2023- Still unable to hold grandson secondary to pain.  Goal  status: ONGOING  6. Patient (< 59 years old) will complete five times sit to stand test in < 10 seconds indicating an increased LE strength and improved balance. Baseline: 20.56 seconds on 7/10; 11/17/2023- Did not assess- focusing on shoulder to prepare  Goal status: INITIAL  PLAN:  PT FREQUENCY: 2x/week  PT DURATION: 8 weeks  PLANNED INTERVENTIONS: 97164- PT Re-evaluation, 97750- Physical Performance Testing, 97110-Therapeutic exercises, 97530- Therapeutic activity, 97112- Neuromuscular re-education, 97535- Self Care, [id number]- Manual therapy, [id number]- Gait training, [id number]- Orthotic Initial, [id number]- Orthotic/Prosthetic subsequent, [phone]- Canalith repositioning, [id number]- Splinting, [id number]- Electrical stimulation (manual), [id number]- Ultrasound, [id number]- Traction (mechanical), [id number]- Parrafin, 20560 (1-2 muscles), 20561 (3+ muscles)- Dry Needling, Patient/Family education, Balance training, Stair training, Taping, Joint mobilization, Spinal mobilization, Scar mobilization, Vestibular training, DME instructions, Cryotherapy, and Moist heat  PLAN FOR NEXT SESSION:   Progress L UE AROM/PROM,  LE strengthening exercises, gentle L shoulder mobilizations    Fonda Simpers, PT, DPT Physical Therapist - [place]  [facility]  11/22/23, 8:30 AM

## 2023-11-23 ENCOUNTER — Ambulatory Visit

## 2023-11-23 DIAGNOSIS — M25562 Pain in left knee: Secondary | ICD-10-CM | POA: Diagnosis not present

## 2023-11-23 DIAGNOSIS — M6281 Muscle weakness (generalized): Secondary | ICD-10-CM

## 2023-11-23 DIAGNOSIS — M25512 Pain in left shoulder: Secondary | ICD-10-CM | POA: Diagnosis not present

## 2023-11-23 NOTE — Therapy (Signed)
 OUTPATIENT PHYSICAL THERAPY SHOULDER/KNEE TREATMENT     Patient Name: Ashley [name] MRN: [id number] DOB:[birth year], 58 y.o., female Today's Date: 11/23/2023  END OF SESSION:  PT End of Session - 11/23/23 0734     Visit Number 12    Number of Visits 16    Date for PT Re-Evaluation 11/28/23    Authorization Type 8 approved WC treatments    Progress Note Due on Visit 20    PT Start Time 0732    PT Stop Time 0800    PT Time Calculation (min) 28 min    Activity Tolerance Patient tolerated treatment well;Patient limited by pain    Behavior During Therapy WFL for tasks assessed/performed           Past Medical History:  Diagnosis Date   Allergy    Anemia    Arthritis    back, left elbow, left hip, left ankle    Asthma    Blood transfusion without reported diagnosis    Complication of anesthesia    delayed emergence   Diabetes mellitus without complication (HCC)    diet controlled   Diverticulosis    GERD (gastroesophageal reflux disease)    Headache    History of hiatal hernia    History of kidney stones    Hyperparathyroidism (HCC)    Lymphedema    PONV (postoperative nausea and vomiting)    S/P subtotal parathyroidectomy    Seizure (HCC)    as a child   Sleep apnea    cannot tolerate mask   Thyroid  nodule 06/20/2018   Varicose vein of leg    Past Surgical History:  Procedure Laterality Date   ACETABULUM FRACTURE SURGERY Left    CESAREAN SECTION     CHOLECYSTECTOMY     COLONOSCOPY WITH PROPOFOL  N/A 05/31/2021   Procedure: COLONOSCOPY WITH PROPOFOL ;  Surgeon: Maryruth Ole DASEN, MD;  Location: [facility];  Service: Endoscopy;  Laterality: N/A;   ELBOW FRACTURE SURGERY  1995   ESOPHAGOGASTRODUODENOSCOPY (EGD) WITH PROPOFOL  N/A 05/31/2021   Procedure: ESOPHAGOGASTRODUODENOSCOPY (EGD) WITH PROPOFOL ;  Surgeon: Maryruth Ole DASEN, MD;  Location: [facility];  Service: Endoscopy;  Laterality: N/A;   FRACTURE SURGERY     left ankle complete  repair, left elbow   HERNIA REPAIR     incisional   LAPAROSCOPIC GASTRIC SLEEVE RESECTION  2014   PARATHYROIDECTOMY N/A 10/25/2018   Procedure: PARATHYROIDECTOMY, DIABETIC, SLEEP APNEA;  Surgeon: Marolyn Nest, MD;  Location: [facility];  Service: General;  Laterality: N/A;   TONSILLECTOMY AND ADENOIDECTOMY N/A 12/08/2015   Procedure: TONSILLECTOMY AND ADENOIDECTOMY;  Surgeon: Chinita Hasten, MD;  Location: [facility];  Service: ENT;  Laterality: N/A;   TOTAL HIP ARTHROPLASTY Left 09/22/2022   Procedure: Left posterior total hip arthroplasty;  Surgeon: Lorelle Hussar, MD;  Location: [facility];  Service: Orthopedics;  Laterality: Left;   TUBAL LIGATION     Patient Active Problem List   Diagnosis Date Noted   Osteoarthritis of left hip 09/22/2022   Side pain 08/16/2022   Musculoskeletal pain 08/16/2022   Varicose veins of both legs with edema 01/05/2021   Swelling of limb 12/30/2020   Diabetes (HCC) 12/30/2020   Lymphedema 12/30/2020   S/P subtotal parathyroidectomy 11/08/2018   Hyperparathyroidism (HCC) 06/20/2018   Thyroid  nodule 06/20/2018   Intractable pain 04/12/2015    PCP:  Ophelia Sage, MD  REFERRING PROVIDER: Norleen Gavel, MD  REFERRING DIAG:  [phone] (ICD-10-CM) - Left shoulder pain  M25.562 (ICD-10-CM) - Left knee pain  THERAPY DIAG:  Muscle weakness (generalized)  Acute pain of left knee  Acute pain of left shoulder  Rationale for Evaluation and Treatment: Rehabilitation  ONSET DATE: 08/30/23  SUBJECTIVE:                                                                                                                                                                                      SUBJECTIVE STATEMENT:  Pt reports the shoulder feeling slightly stiff this morning.  Surgery scheduled for 11/29/2023- L shoulder RCR  Hand dominance: Right  PERTINENT HISTORY: Pt was seen at [facility] ED on 08/30/23 after a fall sustained at work. Pt works in the emergency room and  reported that she slipped on a wet spot and fell onto her left shoulder and knee to avoid falling onto her L hip. Per ED note: Physical exam is generally reassuring. Shoulder joint itself has normal mobility and I am not concerned about dislocation. No evidence of clavicular injury. Mild proximal humerus fracture is possible and the patient is concerned about the fact that she has had multiple prior orthopedic injuries/fractures due to relatively minor falls. We will proceed with x-rays and anticipate restricted movement with shoulder sling, RICE, etc. Pt underwent L THA last June and has previous history of elbow surgery that prevents full movement of L wrist. Pt reports that she wore the sling provided in ED for a couple of weeks after the fall but has not worn it since.  PMH includes anemia, arthritis (back, L elbow, L hip, L ankle), asthma, DM, GERD, headache, hyperparathyroidism, lymphedema, seizure, sleep apnea  PAIN: Reported on L shoulder: Are you having pain? Yes: NPRS scale: 3/10 currently, worst 10/10, best  Pain location: anterior shoulder Pain description: Electric shocks, sharp Aggravating factors: reaching overhead/behind head, movement Relieving factors: Voltaren, Biofreeze, lidocaine  patches, rest  PRECAUTIONS: None  RED FLAGS: None   WEIGHT BEARING RESTRICTIONS: No  FALLS:  Has patient fallen in last 6 months? Yes. Number of falls 1- pt reports that this incident is her only fall  LIVING ENVIRONMENT: Lives with: lives with their spouse Lives in: Mobile home Stairs: Yes: External: 4 steps; can reach both Has following equipment at home: Single point cane and Walker - 2 wheeled  OCCUPATION: Works in ED at [employer]- pt reports that the pain hasn't affected her job duties that much because she is R handed  PLOF: Independent  PATIENT GOALS: Pt wants to be able to hold her 5 month old grandson, get range of motion back to use her L arm normally.  NEXT MD VISIT:  10/12/23  OBJECTIVE:  Note: Objective measures were completed at Evaluation unless otherwise noted.  DIAGNOSTIC FINDINGS:  DG Humerus L (08/30/23): FINDINGS: There is no evidence of an acute fracture or dislocation. Radiopaque surgical screws and fixation wires are seen within the left olecranon process and left radial head. Soft tissues are unremarkable.   IMPRESSION: 1. No acute osseous abnormality. 2. Postoperative changes of the left elbow.   PATIENT SURVEYS:   QuickDASH Score: 68.2 / 100 = 68.2 %  LEFS: 31/80  COGNITION: Overall cognitive status: Within functional limits for tasks assessed     SENSATION: WFL- bilateral UE and LE  POSTURE: Pt holds L UE in guarded position at rest. L shoulder shifted anterior causing forward head/rounded shoulders  UPPER EXTREMITY ROM:   Active ROM Right eval Left eval  Shoulder flexion 155 100  Shoulder extension    Shoulder abduction 120 70  Shoulder adduction    Shoulder internal rotation 75 75  Shoulder external rotation 50 35  Elbow flexion    Elbow extension    Wrist flexion    Wrist extension    Wrist ulnar deviation    Wrist radial deviation    Wrist pronation    Wrist supination    (Blank rows = not tested)  UPPER EXTREMITY MMT:  MMT Right eval Left eval  Shoulder flexion 4 NT  Shoulder extension    Shoulder abduction 3+ NT  Shoulder adduction    Shoulder internal rotation    Shoulder external rotation    Middle trapezius    Lower trapezius    Elbow flexion 4 3-  Elbow extension 4 4  Wrist flexion    Wrist extension    Wrist ulnar deviation    Wrist radial deviation    Wrist pronation    Wrist supination    Grip strength (lbs)    (Blank rows = not tested)  LOWER EXTREMITY MMT:     Right Left  Hip flexion 4- 4-  Hip Abduction 4+ 4+  Hip Adduction 4+ 4+  Knee Extension  3+ 3+*  Knee Flexion 4+ 4  DF 4+ 4  PF 5 5     SHOULDER SPECIAL TESTS: Impingement tests: Painful arc test: positive     PALPATION:  Painful on light palpation of anterior shoulder at greater tubercle; pt is highly sensitive to even light touch of shoulder girdle                                                                                                                             TREATMENT DATE: 11/23/23  TherEx:  Standing isometric shoulder contractions (flexion/extension/abduction/adduction) into rainbow physioball, 2-3 sec holds, x10 each direction  Standing wall ball rolls into forward flexion, 2x10 Standing shoulder circles with physioball, 30 sec bouts each direction  Supine chest press with PVC pipe, no weight added, attempting to perform with AAROM for the L shoulder, x10 Supine shoulder arc with PVC from thighs to full flexion above head, x10    PATIENT EDUCATION: Education details: Exercise technique Person educated:  Patient Education method: Explanation Education comprehension: verbalized understanding  HOME EXERCISE PROGRAM: Access Code: [email or web address] URL: [email or web address] Date: 10/18/2023 Prepared by: Reyes London  Exercises - Seated Hamstring Stretch  - 1-2 x daily - 3 sets - 30 hold - Seated Knee Flexion Stretch  - 1-2 x daily - 3 sets - 30 sec hold   Access Code: [email or web address]  URL: [email or web address]  Date: 10/05/2023  Prepared by: Marina  Moser  Exercises  - Standing shoulder flexion wall slides  - 1 x daily - 7 x weekly - 2 sets - 10 reps - 5 hold  - Standing Shoulder Abduction Slides at Wall  - 1 x daily - 7 x weekly - 2 sets - 10 reps - 5 hold  - Standing Isometric Shoulder Flexion with Doorway - Arm Bent  - 1 x daily - 7 x weekly - 2 sets - 10 reps - 5 hold  - Standing Isometric Shoulder Abduction with Doorway - Arm Bent  - 1 x daily - 7 x weekly - 2 sets - 10 reps - 5 hold  - Standing Isometric Shoulder Internal Rotation at Doorway  - 1 x daily - 7 x weekly - 2 sets - 10 reps - 5 hold  - Standing Isometric Shoulder Extension  with Doorway - Arm Bent  - 1 x daily - 7 x weekly - 2 sets - 10 reps - 5 hold  - Seated Shoulder Pendulum Exercise  - 1 x daily - 7 x weekly - 2 sets - 10 reps - 5 hold   ASSESSMENT:  CLINICAL IMPRESSION:    Pt responded well to the exercises and was givne education regarding the treatment approach following surgery.  Pt was instructed to continue to perform these exercises the day/night prior to the surgery in order for the muscular pathways to be stronger following surgery.  Pt understanding and will perform these exercises over the weekend and leading up to the surgery date.   Pt will continue to benefit from skilled therapy to address remaining deficits in order to improve overall QoL and return to PLOF.            OBJECTIVE IMPAIRMENTS: Abnormal gait, decreased activity tolerance, decreased endurance, decreased mobility, difficulty walking, decreased ROM, decreased strength, hypomobility, increased edema, impaired perceived functional ability, impaired flexibility, impaired UE functional use, improper body mechanics, postural dysfunction, and pain.   ACTIVITY LIMITATIONS: carrying, lifting, bending, sitting, standing, squatting, stairs, transfers, bed mobility, bathing, dressing, self feeding, reach over head, hygiene/grooming, locomotion level, and caring for others  PARTICIPATION LIMITATIONS: meal prep, cleaning, laundry, driving, shopping, community activity, occupation, and yard work  PERSONAL FACTORS: Age, Fitness, Profession, and 3+ comorbidities: anemia, arthritis (back, L elbow, L hip, L ankle), asthma, DM, GERD, headache, hyperparathyroidism, lymphedema, seizure, sleep apnea are also affecting patient's functional outcome.   REHAB POTENTIAL: Good  CLINICAL DECISION MAKING: Evolving/moderate complexity  EVALUATION COMPLEXITY: Moderate   GOALS: Goals reviewed with patient? Yes  SHORT TERM GOALS: Target date: 10/24/2023  Patient will be independent in home exercise  program to improve strength/mobility for better functional independence with ADLs. Baseline:See HEP above; 11/17/2023=Patient able to verbalize and today demonstrate good understanding of HEP especially now since she has received the results from MRI.  Goal status: MET   LONG TERM GOALS: Target date: 11/28/2023  Patient will decrease Quick DASH score by > 8 points demonstrating reduced self-reported upper extremity disability. Baseline: 68.2/100; 11/17/2023- will assess next session Goal status: INITIAL  2.  Patient will  improve LEFS score by >9 points to align with MCID value and demonstrate improved functional mobility and increased tolerance with ADLs. Baseline: 31/80; 11/17/2023= focusing on Left shoulder to prepare for upcoming Sx- did not issue survey this date Goal status: INITIAL  3.  Patient will improve L shoulder AROM to > 140 degrees of flexion, scaption, and abduction for improved ability to perform overhead activities. Baseline: see AROM chart above; 11/17/2023- avoided AROM in elevated position due to now known Supraspinatus tear. Did work on PROM (pain free) ROM Goal status: ONGOING  4.  Patient will report a worst pain of 6/10 on VAS in L shoulder to improve tolerance with ADLs and reduced symptoms with activities. Baseline: 10/10 worst pain at eval; 11/17/2023= [facility] report PT session are helping but still up to 10/10 at times. Patient to have surgery for L RCR on 11/29/2023 Goal status: ONGOING  5.  Patient will be able to tolerate holding her young grandson without an increase in L shoulder pain to show improved L UE function and increase pt's ability to participate in family care. Baseline: pt unable to hold grandson at eval; 11/17/2023- Still unable to hold grandson secondary to pain.  Goal status: ONGOING  6. Patient (< 40 years old) will complete five times sit to stand test in < 10 seconds indicating an increased LE strength and improved balance. Baseline: 20.56 seconds  on 7/10; 11/17/2023- Did not assess- focusing on shoulder to prepare  Goal status: INITIAL  PLAN:  PT FREQUENCY: 2x/week  PT DURATION: 8 weeks  PLANNED INTERVENTIONS: 97164- PT Re-evaluation, 97750- Physical Performance Testing, 97110-Therapeutic exercises, 97530- Therapeutic activity, [id number]- Neuromuscular re-education, 97535- Self Care, [id number]- Manual therapy, [id number]- Gait training, [id number]- Orthotic Initial, [id number]- Orthotic/Prosthetic subsequent, [phone]- Canalith repositioning, [id number]- Splinting, [id number]- Electrical stimulation (manual), [id number]- Ultrasound, [id number]- Traction (mechanical), [id number]- Parrafin, 20560 (1-2 muscles), 20561 (3+ muscles)- Dry Needling, Patient/Family education, Balance training, Stair training, Taping, Joint mobilization, Spinal mobilization, Scar mobilization, Vestibular training, DME instructions, Cryotherapy, and Moist heat  PLAN FOR NEXT SESSION:   D/c at the next visit    Fonda Simpers, PT, DPT Physical Therapist - [facility]  [facility]  11/23/23, 8:06 AM

## 2023-11-28 ENCOUNTER — Ambulatory Visit: Attending: Orthopedic Surgery

## 2023-11-28 DIAGNOSIS — M25612 Stiffness of left shoulder, not elsewhere classified: Secondary | ICD-10-CM | POA: Diagnosis present

## 2023-11-28 DIAGNOSIS — M25562 Pain in left knee: Secondary | ICD-10-CM | POA: Diagnosis present

## 2023-11-28 DIAGNOSIS — M25512 Pain in left shoulder: Secondary | ICD-10-CM | POA: Insufficient documentation

## 2023-11-28 DIAGNOSIS — M6281 Muscle weakness (generalized): Secondary | ICD-10-CM | POA: Insufficient documentation

## 2023-11-28 NOTE — Therapy (Signed)
 OUTPATIENT PHYSICAL THERAPY SHOULDER/KNEE TREATMENT/DISCHARGE     Patient Name: Ashley [name] MRN: [id number] DOB:[birth year], 58 y.o., female Today's Date: 11/28/2023  END OF SESSION:  PT End of Session - 11/28/23 0811     Visit Number 13    Number of Visits 16    Date for PT Re-Evaluation 11/28/23    Authorization Type 8 approved WC treatments    Progress Note Due on Visit 20    PT Start Time 0808    PT Stop Time 0845    PT Time Calculation (min) 37 min    Activity Tolerance Patient tolerated treatment well;Patient limited by pain    Behavior During Therapy [facility] for tasks assessed/performed            Past Medical History:  Diagnosis Date   Allergy    Anemia    Arthritis    back, left elbow, left hip, left ankle    Asthma    Blood transfusion without reported diagnosis    Complication of anesthesia    delayed emergence   Diabetes mellitus without complication (HCC)    diet controlled   Diverticulosis    GERD (gastroesophageal reflux disease)    Headache    History of hiatal hernia    History of kidney stones    Hyperparathyroidism (HCC)    Lymphedema    PONV (postoperative nausea and vomiting)    S/P subtotal parathyroidectomy    Seizure (HCC)    as a child   Sleep apnea    cannot tolerate mask   Thyroid  nodule 06/20/2018   Varicose vein of leg    Past Surgical History:  Procedure Laterality Date   ACETABULUM FRACTURE SURGERY Left    CESAREAN SECTION     CHOLECYSTECTOMY     COLONOSCOPY WITH PROPOFOL  N/A 05/31/2021   Procedure: COLONOSCOPY WITH PROPOFOL ;  Surgeon: Maryruth Ole DASEN, MD;  Location: [facility];  Service: Endoscopy;  Laterality: N/A;   ELBOW FRACTURE SURGERY  1995   ESOPHAGOGASTRODUODENOSCOPY (EGD) WITH PROPOFOL  N/A 05/31/2021   Procedure: ESOPHAGOGASTRODUODENOSCOPY (EGD) WITH PROPOFOL ;  Surgeon: Maryruth Ole DASEN, MD;  Location: [facility];  Service: Endoscopy;  Laterality: N/A;   FRACTURE SURGERY     left ankle  complete repair, left elbow   HERNIA REPAIR     incisional   LAPAROSCOPIC GASTRIC SLEEVE RESECTION  2014   PARATHYROIDECTOMY N/A 10/25/2018   Procedure: PARATHYROIDECTOMY, DIABETIC, SLEEP APNEA;  Surgeon: Marolyn Nest, MD;  Location: [facility] OR[facility];  Service: General;  Laterality: N/A;   TONSILLECTOMY AND ADENOIDECTOMY N/A 12/08/2015   Procedure: TONSILLECTOMY AND ADENOIDECTOMY;  Surgeon: Chinita Hasten, MD;  Location: [facility] OR[facility];  Service: ENT;  Laterality: N/A;   TOTAL HIP ARTHROPLASTY Left 09/22/2022   Procedure: Left posterior total hip arthroplasty;  Surgeon: Lorelle Hussar, MD;  Location: [facility] OR[facility];  Service: Orthopedics;  Laterality: Left;   TUBAL LIGATION     Patient Active Problem List   Diagnosis Date Noted   Osteoarthritis of left hip 09/22/2022   Side pain 08/16/2022   Musculoskeletal pain 08/16/2022   Varicose veins of both legs with edema 01/05/2021   Swelling of limb 12/30/2020   Diabetes (HCC) 12/30/2020   Lymphedema 12/30/2020   S/P subtotal parathyroidectomy 11/08/2018   Hyperparathyroidism (HCC) 06/20/2018   Thyroid  nodule 06/20/2018   Intractable pain 04/12/2015    PCP:  Ophelia Sage, MD  REFERRING PROVIDER: Norleen Gavel, MD  REFERRING DIAG:  [phone] (ICD-10-CM) - Left shoulder pain  M25.562 (ICD-10-CM) - Left knee pain  THERAPY DIAG:  Muscle weakness (generalized)  Acute pain of left knee  Acute pain of left shoulder  Rationale for Evaluation and Treatment: Rehabilitation  ONSET DATE: 08/30/23  SUBJECTIVE:                                                                                                                                                                                      SUBJECTIVE STATEMENT:  Pt reports her shoulder is very sore and is ready to have the surgery.  Pt reports 5/10 pain upon arrival.  Surgery scheduled for 11/29/2023- L shoulder RCR  Hand dominance: Right  PERTINENT HISTORY: Pt was seen at [facility] ED on 08/30/23 after a  fall sustained at work. Pt works in the emergency room and reported that she slipped on a wet spot and fell onto her left shoulder and knee to avoid falling onto her L hip. Per ED note: Physical exam is generally reassuring. Shoulder joint itself has normal mobility and I am not concerned about dislocation. No evidence of clavicular injury. Mild proximal humerus fracture is possible and the patient is concerned about the fact that she has had multiple prior orthopedic injuries/fractures due to relatively minor falls. We will proceed with x-rays and anticipate restricted movement with shoulder sling, RICE, etc. Pt underwent L THA last June and has previous history of elbow surgery that prevents full movement of L wrist. Pt reports that she wore the sling provided in ED for a couple of weeks after the fall but has not worn it since.  PMH includes anemia, arthritis (back, L elbow, L hip, L ankle), asthma, DM, GERD, headache, hyperparathyroidism, lymphedema, seizure, sleep apnea  PAIN: Reported on L shoulder: Are you having pain? Yes: NPRS scale: 3/10 currently, worst 10/10, best  Pain location: anterior shoulder Pain description: Electric shocks, sharp Aggravating factors: reaching overhead/behind head, movement Relieving factors: Voltaren, Biofreeze, lidocaine  patches, rest  PRECAUTIONS: None  RED FLAGS: None   WEIGHT BEARING RESTRICTIONS: No  FALLS:  Has patient fallen in last 6 months? Yes. Number of falls 1- pt reports that this incident is her only fall  LIVING ENVIRONMENT: Lives with: lives with their spouse Lives in: Mobile home Stairs: Yes: External: 4 steps; can reach both Has following equipment at home: Single point cane and Walker - 2 wheeled  OCCUPATION: Works in ED at [employer]- pt reports that the pain hasn't affected her job duties that much because she is R handed  PLOF: Independent  PATIENT GOALS: Pt wants to be able to hold her 60 month old grandson, get range of motion  back to use her L arm normally.  NEXT MD VISIT: 10/12/23  OBJECTIVE:  Note: Objective measures were completed at Evaluation unless otherwise noted.  DIAGNOSTIC FINDINGS:  DG Humerus L (08/30/23): FINDINGS: There is no evidence of an acute fracture or dislocation. Radiopaque surgical screws and fixation wires are seen within the left olecranon process and left radial head. Soft tissues are unremarkable.   IMPRESSION: 1. No acute osseous abnormality. 2. Postoperative changes of the left elbow.   PATIENT SURVEYS:   QuickDASH Score: 68.2 / 100 = 68.2 %  LEFS: 31/80  COGNITION: Overall cognitive status: Within functional limits for tasks assessed     SENSATION: WFL- bilateral UE and LE  POSTURE: Pt holds L UE in guarded position at rest. L shoulder shifted anterior causing forward head/rounded shoulders  UPPER EXTREMITY ROM:   Active ROM Right eval Left eval  Shoulder flexion 155 100  Shoulder extension    Shoulder abduction 120 70  Shoulder adduction    Shoulder internal rotation 75 75  Shoulder external rotation 50 35  Elbow flexion    Elbow extension    Wrist flexion    Wrist extension    Wrist ulnar deviation    Wrist radial deviation    Wrist pronation    Wrist supination    (Blank rows = not tested)  UPPER EXTREMITY MMT:  MMT Right eval Left eval  Shoulder flexion 4 NT  Shoulder extension    Shoulder abduction 3+ NT  Shoulder adduction    Shoulder internal rotation    Shoulder external rotation    Middle trapezius    Lower trapezius    Elbow flexion 4 3-  Elbow extension 4 4  Wrist flexion    Wrist extension    Wrist ulnar deviation    Wrist radial deviation    Wrist pronation    Wrist supination    Grip strength (lbs)    (Blank rows = not tested)  LOWER EXTREMITY MMT:     Right Left  Hip flexion 4- 4-  Hip Abduction 4+ 4+  Hip Adduction 4+ 4+  Knee Extension  3+ 3+*  Knee Flexion 4+ 4  DF 4+ 4  PF 5 5     SHOULDER SPECIAL  TESTS: Impingement tests: Painful arc test: positive    PALPATION:  Painful on light palpation of anterior shoulder at greater tubercle; pt is highly sensitive to even light touch of shoulder girdle                                                                                                                             TREATMENT DATE: 11/28/23  Self-Care/Home Management:  Pt received education regarding plans for pre-surgical intervention in order to improve outcome measures post surgical.  Pt encouraged to perform her isometric exercise either later today, or in the AM of the surgery.     TherEx:  Standing isometric shoulder contractions (flexion/extension/abduction/adduction) into rainbow physioball, 2-3 sec holds, x10 each direction  Standing wall ball rolls into forward flexion, 2x10  Standing shoulder circles with physioball, 30 sec bouts each direction  Supine chest press with PVC pipe, no weight added, attempting to perform with AAROM for the L shoulder, x10 Supine shoulder arc with PVC from thighs to full flexion above head, x10   Manual:  Supine L shoulder GHJ mobilizations, Grades I-II for pain modulation, 30 sec bouts each direction, PA/AP/Inferior glides, x4 bouts Supine gentle long axis distraction of the L shoulder for increased joint spacing of the L GHJ and pain m odulation Supine PROM of the shoulder in all planes of mobility and pt noting pain reduction with passive ER Supine STM to L upper trap region and L deltoid/biceps to increase extensibility of the musculature   PATIENT EDUCATION: Education details: Exercise technique Person educated: Patient Education method: Explanation Education comprehension: verbalized understanding  HOME EXERCISE PROGRAM: Access Code: [email or web address] URL: [email or web address] Date: 10/18/2023 Prepared by: Reyes London  Exercises - Seated Hamstring Stretch  - 1-2 x daily - 3 sets - 30 hold - Seated Knee  Flexion Stretch  - 1-2 x daily - 3 sets - 30 sec hold   Access Code: [email or web address]  URL: [email or web address]  Date: 10/05/2023  Prepared by: Marina  Moser  Exercises  - Standing shoulder flexion wall slides  - 1 x daily - 7 x weekly - 2 sets - 10 reps - 5 hold  - Standing Shoulder Abduction Slides at Wall  - 1 x daily - 7 x weekly - 2 sets - 10 reps - 5 hold  - Standing Isometric Shoulder Flexion with Doorway - Arm Bent  - 1 x daily - 7 x weekly - 2 sets - 10 reps - 5 hold  - Standing Isometric Shoulder Abduction with Doorway - Arm Bent  - 1 x daily - 7 x weekly - 2 sets - 10 reps - 5 hold  - Standing Isometric Shoulder Internal Rotation at Doorway  - 1 x daily - 7 x weekly - 2 sets - 10 reps - 5 hold  - Standing Isometric Shoulder Extension with Doorway - Arm Bent  - 1 x daily - 7 x weekly - 2 sets - 10 reps - 5 hold  - Seated Shoulder Pendulum Exercise  - 1 x daily - 7 x weekly - 2 sets - 10 reps - 5 hold   ASSESSMENT:  CLINICAL IMPRESSION:    Pt has not made significant progress towards goals as this time due to having tear in the supraspinatus.  Pt will be having surgery tomorrow in the AM and therefore will be discharged at this time.  Pt will likely obtain a new referral in order to return following the surgery.  Pt encouraged to contact the clinic if she had any concerns or questions.  Pt is d/c at this time.        OBJECTIVE IMPAIRMENTS: Abnormal gait, decreased activity tolerance, decreased endurance, decreased mobility, difficulty walking, decreased ROM, decreased strength, hypomobility, increased edema, impaired perceived functional ability, impaired flexibility, impaired UE functional use, improper body mechanics, postural dysfunction, and pain.   ACTIVITY LIMITATIONS: carrying, lifting, bending, sitting, standing, squatting, stairs, transfers, bed mobility, bathing, dressing, self feeding, reach over head, hygiene/grooming, locomotion level, and caring for  others  PARTICIPATION LIMITATIONS: meal prep, cleaning, laundry, driving, shopping, community activity, occupation, and yard work  PERSONAL FACTORS: Age, Fitness, Profession, and 3+ comorbidities: anemia, arthritis (back, L elbow, L hip, L ankle), asthma, DM, GERD, headache, hyperparathyroidism, lymphedema, seizure, sleep apnea are also affecting patient's functional outcome.  REHAB POTENTIAL: Good  CLINICAL DECISION MAKING: Evolving/moderate complexity  EVALUATION COMPLEXITY: Moderate   GOALS: Goals reviewed with patient? Yes  SHORT TERM GOALS: Target date: 10/24/2023  Patient will be independent in home exercise program to improve strength/mobility for better functional independence with ADLs. Baseline:See HEP above; 11/17/2023=Patient able to verbalize and today demonstrate good understanding of HEP especially now since she has received the results from MRI.  Goal status: MET   LONG TERM GOALS: Target date: 11/28/2023  Patient will decrease Quick DASH score by > 8 points demonstrating reduced self-reported upper extremity disability. Baseline: 68.2/100; 11/17/2023- will assess next session Goal status: INITIAL  2.  Patient will improve LEFS score by >9 points to align with MCID value and demonstrate improved functional mobility and increased tolerance with ADLs. Baseline: 31/80; 11/17/2023= focusing on Left shoulder to prepare for upcoming Sx- did not issue survey this date Goal status: INITIAL  3.  Patient will improve L shoulder AROM to > 140 degrees of flexion, scaption, and abduction for improved ability to perform overhead activities. Baseline: see AROM chart above; 11/17/2023- avoided AROM in elevated position due to now known Supraspinatus tear. Did work on PROM (pain free) ROM Goal status: ONGOING  4.  Patient will report a worst pain of 6/10 on VAS in L shoulder to improve tolerance with ADLs and reduced symptoms with activities. Baseline: 10/10 worst pain at eval;  11/17/2023= [facility] report PT session are helping but still up to 10/10 at times. Patient to have surgery for L RCR on 11/29/2023 Goal status: ONGOING  5.  Patient will be able to tolerate holding her young grandson without an increase in L shoulder pain to show improved L UE function and increase pt's ability to participate in family care. Baseline: pt unable to hold grandson at eval; 11/17/2023- Still unable to hold grandson secondary to pain.  Goal status: ONGOING  6. Patient (< 25 years old) will complete five times sit to stand test in < 10 seconds indicating an increased LE strength and improved balance. Baseline: 20.56 seconds on 7/10; 11/17/2023- Did not assess- focusing on shoulder to prepare  Goal status: INITIAL  PLAN:  PT FREQUENCY: 2x/week  PT DURATION: 8 weeks  PLANNED INTERVENTIONS: 97164- PT Re-evaluation, 97750- Physical Performance Testing, 97110-Therapeutic exercises, 97530- Therapeutic activity, [id number]- Neuromuscular re-education, 97535- Self Care, [id number]- Manual therapy, [id number]- Gait training, [id number]- Orthotic Initial, [id number]- Orthotic/Prosthetic subsequent, [phone]- Canalith repositioning, [id number]- Splinting, [id number]- Electrical stimulation (manual), [id number]- Ultrasound, [id number]- Traction (mechanical), [id number]- Parrafin, 20560 (1-2 muscles), 20561 (3+ muscles)- Dry Needling, Patient/Family education, Balance training, Stair training, Taping, Joint mobilization, Spinal mobilization, Scar mobilization, Vestibular training, DME instructions, Cryotherapy, and Moist heat  PLAN FOR NEXT SESSION:   D/C    Fonda Simpers, PT, DPT Physical Therapist - [facility]  11/28/23, 4:00 PM

## 2023-11-29 ENCOUNTER — Other Ambulatory Visit: Payer: Self-pay

## 2023-11-29 ENCOUNTER — Encounter

## 2023-11-29 MED ORDER — OXYCODONE HCL 5 MG PO TABS
5.0000 mg | ORAL_TABLET | Freq: Four times a day (QID) | ORAL | 0 refills | Status: AC | PRN
  Filled 2023-11-29: qty 20, 5d supply, fill #0

## 2023-11-30 ENCOUNTER — Other Ambulatory Visit: Payer: Self-pay

## 2023-11-30 ENCOUNTER — Encounter

## 2023-11-30 MED ORDER — TIZANIDINE HCL 2 MG PO TABS
2.0000 mg | ORAL_TABLET | Freq: Three times a day (TID) | ORAL | 0 refills | Status: DC | PRN
  Filled 2023-11-30: qty 30, 10d supply, fill #0

## 2023-12-04 ENCOUNTER — Ambulatory Visit

## 2023-12-06 ENCOUNTER — Ambulatory Visit

## 2023-12-07 ENCOUNTER — Other Ambulatory Visit: Payer: Self-pay

## 2023-12-07 MED ORDER — TRAMADOL HCL 50 MG PO TABS
50.0000 mg | ORAL_TABLET | Freq: Four times a day (QID) | ORAL | 0 refills | Status: AC | PRN
  Filled 2023-12-07: qty 20, 5d supply, fill #0

## 2023-12-11 ENCOUNTER — Ambulatory Visit

## 2023-12-11 DIAGNOSIS — M6281 Muscle weakness (generalized): Secondary | ICD-10-CM | POA: Diagnosis not present

## 2023-12-11 DIAGNOSIS — M25512 Pain in left shoulder: Secondary | ICD-10-CM

## 2023-12-11 DIAGNOSIS — M25612 Stiffness of left shoulder, not elsewhere classified: Secondary | ICD-10-CM

## 2023-12-11 NOTE — Therapy (Signed)
 OUTPATIENT PHYSICAL THERAPY L SHOULDER EVALUATION/TREATMENT     Patient Name: Ashley [name] MRN: [id number] DOB:Jun 29, 1965, 58 y.o., female Today's Date: 12/12/2023  END OF SESSION:  PT End of Session - 12/11/23 0727     Visit Number 1    Number of Visits 24    Date for PT Re-Evaluation 03/04/24    PT Start Time 0719    PT Stop Time 0807    PT Time Calculation (min) 48 min    Activity Tolerance Patient tolerated treatment well;Patient limited by pain    Behavior During Therapy [facility] for tasks assessed/performed            Past Medical History:  Diagnosis Date   Allergy    Anemia    Arthritis    back, left elbow, left hip, left ankle    Asthma    Blood transfusion without reported diagnosis    Complication of anesthesia    delayed emergence   Diabetes mellitus without complication (HCC)    diet controlled   Diverticulosis    GERD (gastroesophageal reflux disease)    Headache    History of hiatal hernia    History of kidney stones    Hyperparathyroidism (HCC)    Lymphedema    PONV (postoperative nausea and vomiting)    S/P subtotal parathyroidectomy    Seizure (HCC)    as a child   Sleep apnea    cannot tolerate mask   Thyroid  nodule 06/20/2018   Varicose vein of leg    Past Surgical History:  Procedure Laterality Date   ACETABULUM FRACTURE SURGERY Left    CESAREAN SECTION     CHOLECYSTECTOMY     COLONOSCOPY WITH PROPOFOL  N/A 05/31/2021   Procedure: COLONOSCOPY WITH PROPOFOL ;  Surgeon: Maryruth Ole DASEN, MD;  Location: [facility] ENDOSCOPY;  Service: Endoscopy;  Laterality: N/A;   ELBOW FRACTURE SURGERY  1995   ESOPHAGOGASTRODUODENOSCOPY (EGD) WITH PROPOFOL  N/A 05/31/2021   Procedure: ESOPHAGOGASTRODUODENOSCOPY (EGD) WITH PROPOFOL ;  Surgeon: Maryruth Ole DASEN, MD;  Location: [facility] ENDOSCOPY;  Service: Endoscopy;  Laterality: N/A;   FRACTURE SURGERY     left ankle complete repair, left elbow   HERNIA REPAIR     incisional   LAPAROSCOPIC  GASTRIC SLEEVE RESECTION  2014   PARATHYROIDECTOMY N/A 10/25/2018   Procedure: PARATHYROIDECTOMY, DIABETIC, SLEEP APNEA;  Surgeon: Marolyn Nest, MD;  Location: [facility] ORS;  Service: General;  Laterality: N/A;   TONSILLECTOMY AND ADENOIDECTOMY N/A 12/08/2015   Procedure: TONSILLECTOMY AND ADENOIDECTOMY;  Surgeon: Chinita Hasten, MD;  Location: [facility] ORS;  Service: ENT;  Laterality: N/A;   TOTAL HIP ARTHROPLASTY Left 09/22/2022   Procedure: Left posterior total hip arthroplasty;  Surgeon: Lorelle Hussar, MD;  Location: [facility] ORS;  Service: Orthopedics;  Laterality: Left;   TUBAL LIGATION     Patient Active Problem List   Diagnosis Date Noted   Osteoarthritis of left hip 09/22/2022   Side pain 08/16/2022   Musculoskeletal pain 08/16/2022   Varicose veins of both legs with edema 01/05/2021   Swelling of limb 12/30/2020   Diabetes (HCC) 12/30/2020   Lymphedema 12/30/2020   S/P subtotal parathyroidectomy 11/08/2018   Hyperparathyroidism (HCC) 06/20/2018   Thyroid  nodule 06/20/2018   Intractable pain 04/12/2015    PCP:  Ophelia Sage, MD  REFERRING PROVIDER: Norleen Gavel, MD  REFERRING DIAG:  [id number] (ICD-10-CM) - Left shoulder pain  M25.562 (ICD-10-CM) - Left knee pain    THERAPY DIAG:  Acute pain of left shoulder - Plan: PT plan of care cert/re-cert  Decreased ROM of left shoulder - Plan: PT plan of care cert/re-cert  Rationale for Evaluation and Treatment: Rehabilitation  ONSET DATE: 08/30/23  SUBJECTIVE:                                                                                                                                                                                      SUBJECTIVE STATEMENT:  Pt reports 4/10 pain upon arrival to the clinic.  Pt is 12 days post op from L RTC repair.  Pt also reports having a frayed biceps and that may have been repaired.    Surgery scheduled for 11/29/2023- L shoulder RCR  Hand dominance: Right  PERTINENT HISTORY: Pt was seen at  [facility] ED on 08/30/23 after a fall sustained at work. Pt works in the emergency room and reported that she slipped on a wet spot and fell onto her left shoulder and knee to avoid falling onto her L hip. Per ED note: Physical exam is generally reassuring. Shoulder joint itself has normal mobility and I am not concerned about dislocation. No evidence of clavicular injury. Mild proximal humerus fracture is possible and the patient is concerned about the fact that she has had multiple prior orthopedic injuries/fractures due to relatively minor falls. We will proceed with x-rays and anticipate restricted movement with shoulder sling, RICE, etc. Pt underwent L THA last June and has previous history of elbow surgery that prevents full movement of L wrist. Pt reports that she wore the sling provided in ED for a couple of weeks after the fall but has not worn it since.  PMH includes anemia, arthritis (back, L elbow, L hip, L ankle), asthma, DM, GERD, headache, hyperparathyroidism, lymphedema, seizure, sleep apnea  PAIN: Reported on L shoulder: Are you having pain? Yes: NPRS scale: 4/10 currently, worst 10/10 with movement, best: 4/10  Pain location: anterior shoulder Pain description: Sharp when it hits. Aggravating factors: reaching overhead/behind head, movement Relieving factors: Voltaren, Biofreeze, lidocaine  patches, rest  PRECAUTIONS: None  RED FLAGS: None   WEIGHT BEARING RESTRICTIONS: No  FALLS:  Has patient fallen in last 6 months? Yes. Number of falls 1- pt reports that this incident is her only fall  LIVING ENVIRONMENT: Lives with: lives with their spouse Lives in: Mobile home Stairs: Yes: External: 4 steps; can reach both Has following equipment at home: Single point cane and Walker - 2 wheeled  OCCUPATION: Works in ED at [employer]- pt reports that the pain hasn't affected her job duties that much because she is R handed  PLOF: Independent  PATIENT GOALS: Pt wants to be able to hold her 66  month old grandson, get range of motion  back to use her L arm normally.  NEXT MD VISIT: 10/12/23  OBJECTIVE:  Note: Objective measures were completed at Evaluation unless otherwise noted.  DIAGNOSTIC FINDINGS:  DG Humerus L (08/30/23): FINDINGS: There is no evidence of an acute fracture or dislocation. Radiopaque surgical screws and fixation wires are seen within the left olecranon process and left radial head. Soft tissues are unremarkable.   IMPRESSION: 1. No acute osseous abnormality. 2. Postoperative changes of the left elbow.   PATIENT SURVEYS:   QuickDASH Score: 68.2 / 100 = 68.2 %  COGNITION: Overall cognitive status: Within functional limits for tasks assessed     SENSATION: WFL- bilateral UE and LE  POSTURE: Pt holds L UE in guarded position at rest. L shoulder in sling upon arrival, but doffs with ease.   UPPER EXTREMITY ROM:   Active ROM Right eval Left eval  Shoulder flexion 155 NT  Shoulder extension    Shoulder abduction 120 NT  Shoulder adduction    Shoulder internal rotation 75 NT  Shoulder external rotation 50 NT  Elbow flexion    Elbow extension    Wrist flexion    Wrist extension    Wrist ulnar deviation    Wrist radial deviation    Wrist pronation    Wrist supination    (Blank rows = not tested)   UPPER EXTREMITY MMT:  MMT Right eval Left eval  Shoulder flexion 4 NT  Shoulder extension    Shoulder abduction 3+ NT  Shoulder adduction    Shoulder internal rotation    Shoulder external rotation    Middle trapezius    Lower trapezius    Elbow flexion 4 NT  Elbow extension 4 NT  Wrist flexion    Wrist extension    Wrist ulnar deviation    Wrist radial deviation    Wrist pronation    Wrist supination    Grip strength (lbs)    (Blank rows = not tested)    PALPATION:  Pt with expected tenderness around the incision sites and still covered with the bandaging.                                                                                                                                TREATMENT DATE: 12/12/23   Self-Care/Home Management:  Pt received education regarding current progression and plan for therapy.  Pt also instructed on the sling and wedge that she's currently utilizing and the proper method of keeping the arm in the correct place for pain management.  Pt also instructed on pain management with exercises and the use of PROM for mobility of the L shoulder.     TherEx:  Supine PROM of the L shoulder into flexion, abduction, IR, and ER within pain tolerable ranges.  Pt with soft end feels throughout the process and was advised to let the therapist know when feeling discomfort.  Pt able to achieve 90 deg of flexion and  abduction without any pain, and ER/IR ROM limited to ~30 each direction.  Supine scapular retractions, x10 Standing pendulums into flexion/extension, 30 sec bouts x2 Standing pendulums into horizontal abduction/adduction, 30 sec bouts x2 Standing pendulums into circular movement CW/CCW, 30 sec bouts x2 each direction  Seated shoulder shrugs, x10 Seated elbow flexion/extension with PROM utilizing the R UE to perform, 2x10 Seated elbow supination/pronation with PROM utilize the R UE to perform, 2x10   PATIENT EDUCATION: Education details: Exercise technique Person educated: Patient Education method: Explanation Education comprehension: verbalized understanding  HOME EXERCISE PROGRAM: Access Code: [email or web address] URL: [email or web address] Date: 12/11/2023 Prepared by: Sidra Simpers  Exercises - Circular Shoulder Pendulum with Table Support  - 1 x daily - 7 x weekly - 3 sets - 10 reps - Flexion-Extension Shoulder Pendulum with Table Support  - 1 x daily - 7 x weekly - 3 sets - 10 reps - Horizontal Shoulder Pendulum with Table Support  - 1 x daily - 7 x weekly - 3 sets - 10 reps - Supine Scapular Retraction  - 1 x daily - 7 x weekly - 3 sets - 10 reps - Seated Shoulder  Shrug Circles AROM Backward  - 1 x daily - 7 x weekly - 3 sets - 10 reps - Wrist AROM Flexion Extension  - 1 x daily - 7 x weekly - 3 sets - 10 reps - Elbow Flexion PROM  - 1 x daily - 7 x weekly - 3 sets - 10 reps - Elbow Extension PROM  - 1 x daily - 7 x weekly - 3 sets - 10 reps - Forearm Pronation PROM  - 1 x daily - 7 x weekly - 3 sets - 10 reps - Forearm Supination PROM  - 1 x daily - 7 x weekly - 3 sets - 10 reps   Access Code: [email or web address]  URL: [email or web address]  Date: 10/05/2023  Prepared by: Marina  Moser  Exercises  - Standing shoulder flexion wall slides  - 1 x daily - 7 x weekly - 2 sets - 10 reps - 5 hold  - Standing Shoulder Abduction Slides at Wall  - 1 x daily - 7 x weekly - 2 sets - 10 reps - 5 hold  - Standing Isometric Shoulder Flexion with Doorway - Arm Bent  - 1 x daily - 7 x weekly - 2 sets - 10 reps - 5 hold  - Standing Isometric Shoulder Abduction with Doorway - Arm Bent  - 1 x daily - 7 x weekly - 2 sets - 10 reps - 5 hold  - Standing Isometric Shoulder Internal Rotation at Doorway  - 1 x daily - 7 x weekly - 2 sets - 10 reps - 5 hold  - Standing Isometric Shoulder Extension with Doorway - Arm Bent  - 1 x daily - 7 x weekly - 2 sets - 10 reps - 5 hold  - Seated Shoulder Pendulum Exercise  - 1 x daily - 7 x weekly - 2 sets - 10 reps - 5 hold   ASSESSMENT:  CLINICAL IMPRESSION:    Pt responded well to the treatment approach, noting it to actually feel better at the conclusion of the session, although she was expecting to be hurting.  Pt with good PROM and was able to tolerate good mobility with soft end feel.  Pt also required verbal and visual cuing for the exercises, more specifically the pendulums, however at the end of the session was able to perform with good  technique.  Pt then assisted back into sling and will continue with scheduling subsequent visits.   Pt will continue to benefit from skilled therapy to address remaining deficits in order to  improve overall QoL and return to PLOF.       OBJECTIVE IMPAIRMENTS: Abnormal gait, decreased activity tolerance, decreased endurance, decreased mobility, difficulty walking, decreased ROM, decreased strength, hypomobility, increased edema, impaired perceived functional ability, impaired flexibility, impaired UE functional use, improper body mechanics, postural dysfunction, and pain.   ACTIVITY LIMITATIONS: carrying, lifting, bending, sitting, standing, squatting, stairs, transfers, bed mobility, bathing, dressing, self feeding, reach over head, hygiene/grooming, locomotion level, and caring for others  PARTICIPATION LIMITATIONS: meal prep, cleaning, laundry, driving, shopping, community activity, occupation, and yard work  PERSONAL FACTORS: Age, Fitness, Profession, and 3+ comorbidities: anemia, arthritis (back, L elbow, L hip, L ankle), asthma, DM, GERD, headache, hyperparathyroidism, lymphedema, seizure, sleep apnea are also affecting patient's functional outcome.   REHAB POTENTIAL: Good  CLINICAL DECISION MAKING: Evolving/moderate complexity  EVALUATION COMPLEXITY: Moderate   GOALS: Goals reviewed with patient? Yes  SHORT TERM GOALS: Target date: 10/24/2023  Patient will be independent in home exercise program to improve strength/mobility for better functional independence with ADLs. Baseline:See HEP above; 11/17/2023=Patient able to verbalize and today demonstrate good understanding of HEP especially now since she has received the results from MRI.  Goal status: MET   LONG TERM GOALS: Target date: 11/28/2023  Patient will decrease Quick DASH score by > 8 points demonstrating reduced self-reported upper extremity disability. Baseline: 68.2/100; 11/17/2023- will assess next session 11/30/23:  Goal status: INITIAL  2.  Patient will improve L shoulder AROM to > 140 degrees of flexion, scaption, and abduction for improved ability to perform overhead activities. Baseline: avoided AROM  due to surgical procedure Goal status: INITIAL  3.  Patient will report a worst pain of 6/10 on VAS in L shoulder to improve tolerance with ADLs and reduced symptoms with activities. Baseline: 10/10 at worst Goal status: INITIAL  4.  Patient will be able to tolerate holding her young grandson without an increase in L shoulder pain to show improved L UE function and increase pt's ability to participate in family care. Baseline: Pt is unable to pick up grandson due to precautions of the L shoulder following RTC repair Goal status: ONGOING   PLAN:  PT FREQUENCY: 2x/week  PT DURATION: 8 weeks  PLANNED INTERVENTIONS: 97164- PT Re-evaluation, 97750- Physical Performance Testing, 97110-Therapeutic exercises, 97530- Therapeutic activity, 97112- Neuromuscular re-education, 97535- Self Care, [id number]- Manual therapy, [id number]- Gait training, [id number]- Orthotic Initial, [id number]- Orthotic/Prosthetic subsequent, [phone]- Canalith repositioning, [id number]- Splinting, [id number]- Electrical stimulation (manual), [id number]- Ultrasound, [id number]- Traction (mechanical), [id number]- Parrafin, [id number] (1-2 muscles), [id number] (3+ muscles)- Dry Needling, Patient/Family education, Balance training, Stair training, Taping, Joint mobilization, Spinal mobilization, Scar mobilization, Vestibular training, DME instructions, Cryotherapy, and Moist heat  PLAN FOR NEXT SESSION:   Continue PROM of the L shoulder for the next several weeks.   Fonda Simpers, PT, DPT Physical Therapist - [facility]  12/12/23, 9:35 AM

## 2023-12-13 ENCOUNTER — Ambulatory Visit

## 2023-12-13 DIAGNOSIS — M25562 Pain in left knee: Secondary | ICD-10-CM

## 2023-12-13 DIAGNOSIS — M6281 Muscle weakness (generalized): Secondary | ICD-10-CM | POA: Diagnosis not present

## 2023-12-13 DIAGNOSIS — M25512 Pain in left shoulder: Secondary | ICD-10-CM

## 2023-12-13 DIAGNOSIS — M25612 Stiffness of left shoulder, not elsewhere classified: Secondary | ICD-10-CM

## 2023-12-13 NOTE — Therapy (Signed)
 OUTPATIENT PHYSICAL THERAPY L SHOULDER TREATMENT     Patient Name: Ashley [name] MRN: [id number] DOB:July 23, 1965, 58 y.o., female Today's Date: 12/13/2023  END OF SESSION:  PT End of Session - 12/13/23 0855     Visit Number 2    Number of Visits 24    Date for PT Re-Evaluation 03/04/24    PT Start Time 0851    PT Stop Time 0930    PT Time Calculation (min) 39 min    Activity Tolerance Patient tolerated treatment well;Patient limited by pain    Behavior During Therapy [facility] for tasks assessed/performed          Past Medical History:  Diagnosis Date   Allergy    Anemia    Arthritis    back, left elbow, left hip, left ankle    Asthma    Blood transfusion without reported diagnosis    Complication of anesthesia    delayed emergence   Diabetes mellitus without complication (HCC)    diet controlled   Diverticulosis    GERD (gastroesophageal reflux disease)    Headache    History of hiatal hernia    History of kidney stones    Hyperparathyroidism (HCC)    Lymphedema    PONV (postoperative nausea and vomiting)    S/P subtotal parathyroidectomy    Seizure (HCC)    as a child   Sleep apnea    cannot tolerate mask   Thyroid  nodule 06/20/2018   Varicose vein of leg    Past Surgical History:  Procedure Laterality Date   ACETABULUM FRACTURE SURGERY Left    CESAREAN SECTION     CHOLECYSTECTOMY     COLONOSCOPY WITH PROPOFOL  N/A 05/31/2021   Procedure: COLONOSCOPY WITH PROPOFOL ;  Surgeon: Maryruth Ole DASEN, MD;  Location: [facility] ENDOSCOPY;  Service: Endoscopy;  Laterality: N/A;   ELBOW FRACTURE SURGERY  1995   ESOPHAGOGASTRODUODENOSCOPY (EGD) WITH PROPOFOL  N/A 05/31/2021   Procedure: ESOPHAGOGASTRODUODENOSCOPY (EGD) WITH PROPOFOL ;  Surgeon: Maryruth Ole DASEN, MD;  Location: [facility] ENDOSCOPY;  Service: Endoscopy;  Laterality: N/A;   FRACTURE SURGERY     left ankle complete repair, left elbow   HERNIA REPAIR     incisional   LAPAROSCOPIC GASTRIC SLEEVE  RESECTION  2014   PARATHYROIDECTOMY N/A 10/25/2018   Procedure: PARATHYROIDECTOMY, DIABETIC, SLEEP APNEA;  Surgeon: Marolyn Nest, MD;  Location: [facility] ORS;  Service: General;  Laterality: N/A;   TONSILLECTOMY AND ADENOIDECTOMY N/A 12/08/2015   Procedure: TONSILLECTOMY AND ADENOIDECTOMY;  Surgeon: Chinita Hasten, MD;  Location: [facility] ORS;  Service: ENT;  Laterality: N/A;   TOTAL HIP ARTHROPLASTY Left 09/22/2022   Procedure: Left posterior total hip arthroplasty;  Surgeon: Lorelle Hussar, MD;  Location: [facility] ORS;  Service: Orthopedics;  Laterality: Left;   TUBAL LIGATION     Patient Active Problem List   Diagnosis Date Noted   Osteoarthritis of left hip 09/22/2022   Side pain 08/16/2022   Musculoskeletal pain 08/16/2022   Varicose veins of both legs with edema 01/05/2021   Swelling of limb 12/30/2020   Diabetes (HCC) 12/30/2020   Lymphedema 12/30/2020   S/P subtotal parathyroidectomy 11/08/2018   Hyperparathyroidism (HCC) 06/20/2018   Thyroid  nodule 06/20/2018   Intractable pain 04/12/2015    PCP:  Ophelia Sage, MD  REFERRING PROVIDER: Norleen Gavel, MD  REFERRING DIAG:  [phone] (ICD-10-CM) - Left shoulder pain  M25.562 (ICD-10-CM) - Left knee pain    THERAPY DIAG:  Acute pain of left shoulder  Decreased ROM of left shoulder  Muscle weakness (  generalized)  Acute pain of left knee  Rationale for Evaluation and Treatment: Rehabilitation  ONSET DATE: 08/30/23  SUBJECTIVE:                                                                                                                                                                                      SUBJECTIVE STATEMENT:  Pt reports feeling better on the pillowed sling, rather than the blue sling AND the pillow sling.  Pt otherwise is doing well, noting some increased pain at night with rest, but she is doing well.    Hand dominance: Right  PERTINENT HISTORY: Pt was seen at [facility] ED on 08/30/23 after a fall sustained at  work. Pt works in the emergency room and reported that she slipped on a wet spot and fell onto her left shoulder and knee to avoid falling onto her L hip. Per ED note: Physical exam is generally reassuring. Shoulder joint itself has normal mobility and I am not concerned about dislocation. No evidence of clavicular injury. Mild proximal humerus fracture is possible and the patient is concerned about the fact that she has had multiple prior orthopedic injuries/fractures due to relatively minor falls. We will proceed with x-rays and anticipate restricted movement with shoulder sling, RICE, etc. Pt underwent L THA last June and has previous history of elbow surgery that prevents full movement of L wrist. Pt reports that she wore the sling provided in ED for a couple of weeks after the fall but has not worn it since.  PMH includes anemia, arthritis (back, L elbow, L hip, L ankle), asthma, DM, GERD, headache, hyperparathyroidism, lymphedema, seizure, sleep apnea  PAIN: Reported on L shoulder: Are you having pain? Yes: NPRS scale: 4/10 currently, worst 10/10 with movement, best: 4/10  Pain location: anterior shoulder Pain description: Sharp when it hits. Aggravating factors: reaching overhead/behind head, movement Relieving factors: Voltaren, Biofreeze, lidocaine  patches, rest  PRECAUTIONS: None  RED FLAGS: None   WEIGHT BEARING RESTRICTIONS: No  FALLS:  Has patient fallen in last 6 months? Yes. Number of falls 1- pt reports that this incident is her only fall  LIVING ENVIRONMENT: Lives with: lives with their spouse Lives in: Mobile home Stairs: Yes: External: 4 steps; can reach both Has following equipment at home: Single point cane and Walker - 2 wheeled  OCCUPATION: Works in ED at [employer]- pt reports that the pain hasn't affected her job duties that much because she is R handed  PLOF: Independent  PATIENT GOALS: Pt wants to be able to hold her 95 month old grandson, get range of motion  back to use her L arm normally.  NEXT MD VISIT:  10/12/23  OBJECTIVE:  Note: Objective measures were completed at Evaluation unless otherwise noted.  DIAGNOSTIC FINDINGS:  DG Humerus L (08/30/23): FINDINGS: There is no evidence of an acute fracture or dislocation. Radiopaque surgical screws and fixation wires are seen within the left olecranon process and left radial head. Soft tissues are unremarkable.   IMPRESSION: 1. No acute osseous abnormality. 2. Postoperative changes of the left elbow.   PATIENT SURVEYS:   QuickDASH Score: 68.2 / 100 = 68.2 %  COGNITION: Overall cognitive status: Within functional limits for tasks assessed     SENSATION: WFL- bilateral UE and LE  POSTURE: Pt holds L UE in guarded position at rest. L shoulder in sling upon arrival, but doffs with ease.   UPPER EXTREMITY ROM:   Active ROM Right eval Left PROM eval  Shoulder flexion 155 90  Shoulder extension    Shoulder abduction 120 90  Shoulder adduction    Shoulder internal rotation 75 NT  Shoulder external rotation 50 NT  Elbow flexion    Elbow extension    Wrist flexion    Wrist extension    Wrist ulnar deviation    Wrist radial deviation    Wrist pronation    Wrist supination    (Blank rows = not tested)   UPPER EXTREMITY MMT:  MMT Right eval Left eval  Shoulder flexion 4 NT  Shoulder extension    Shoulder abduction 3+ NT  Shoulder adduction    Shoulder internal rotation    Shoulder external rotation    Middle trapezius    Lower trapezius    Elbow flexion 4 NT  Elbow extension 4 NT  Wrist flexion    Wrist extension    Wrist ulnar deviation    Wrist radial deviation    Wrist pronation    Wrist supination    Grip strength (lbs)    (Blank rows = not tested)    PALPATION:  Pt with expected tenderness around the incision sites and still covered with the bandaging.                                                                                                                                TREATMENT DATE: 12/13/23  Manual:   Supine STM with TP release to the L UT for tissue extensibility Supine UT stretch, 30 sec bouts each side   Self-Care/Home Management:  Education regarding sleep habits and potential use of the recliner to garner better sleep.  Pt advised on current sling and utilizing pillows and/or pillow sling for pain management and proper positioning.     TherEx:  Supine PROM of the L shoulder into flexion, abduction, IR, and ER within pain tolerable ranges.  Pt with soft end feels throughout the process and was advised to let the therapist know when feeling discomfort.  Pt able to achieve 110 deg of flexion and abduction without any pain, and ER ROM limited to ~30  and ~  40 deg for IR.  Supine scapular retractions, x10  Seated shoulder shrugs, x10 Seated elbow flexion/extension with PROM utilizing the R UE to perform, 2x10 Seated elbow supination/pronation with PROM utilize the R UE to perform, 2x10   PATIENT EDUCATION: Education details: Exercise technique Person educated: Patient Education method: Explanation Education comprehension: verbalized understanding  HOME EXERCISE PROGRAM: Access Code: [email or web address] URL: [email or web address] Date: 12/11/2023 Prepared by: Sidra Simpers  Exercises - Circular Shoulder Pendulum with Table Support  - 1 x daily - 7 x weekly - 3 sets - 10 reps - Flexion-Extension Shoulder Pendulum with Table Support  - 1 x daily - 7 x weekly - 3 sets - 10 reps - Horizontal Shoulder Pendulum with Table Support  - 1 x daily - 7 x weekly - 3 sets - 10 reps - Supine Scapular Retraction  - 1 x daily - 7 x weekly - 3 sets - 10 reps - Seated Shoulder Shrug Circles AROM Backward  - 1 x daily - 7 x weekly - 3 sets - 10 reps - Wrist AROM Flexion Extension  - 1 x daily - 7 x weekly - 3 sets - 10 reps - Elbow Flexion PROM  - 1 x daily - 7 x weekly - 3 sets - 10 reps - Elbow Extension PROM  - 1 x daily - 7 x  weekly - 3 sets - 10 reps - Forearm Pronation PROM  - 1 x daily - 7 x weekly - 3 sets - 10 reps - Forearm Supination PROM  - 1 x daily - 7 x weekly - 3 sets - 10 reps   Access Code: [email or web address]  URL: [email or web address]  Date: 10/05/2023  Prepared by: Marina  Moser  Exercises  - Standing shoulder flexion wall slides  - 1 x daily - 7 x weekly - 2 sets - 10 reps - 5 hold  - Standing Shoulder Abduction Slides at Wall  - 1 x daily - 7 x weekly - 2 sets - 10 reps - 5 hold  - Standing Isometric Shoulder Flexion with Doorway - Arm Bent  - 1 x daily - 7 x weekly - 2 sets - 10 reps - 5 hold  - Standing Isometric Shoulder Abduction with Doorway - Arm Bent  - 1 x daily - 7 x weekly - 2 sets - 10 reps - 5 hold  - Standing Isometric Shoulder Internal Rotation at Doorway  - 1 x daily - 7 x weekly - 2 sets - 10 reps - 5 hold  - Standing Isometric Shoulder Extension with Doorway - Arm Bent  - 1 x daily - 7 x weekly - 2 sets - 10 reps - 5 hold  - Seated Shoulder Pendulum Exercise  - 1 x daily - 7 x weekly - 2 sets - 10 reps - 5 hold   ASSESSMENT:  CLINICAL IMPRESSION:    Pt responded well to the exercises and was able to achieve greater PROM today than the previous session.  Pt noted to be able to achieve 110 deg of flexion and abduction with complete PROM.  Pt continues to make progress towards goals and is able to perform the exercises without any increase in pain.   Pt will continue to benefit from skilled therapy to address remaining deficits in order to improve overall QoL and return to PLOF.        OBJECTIVE IMPAIRMENTS: Abnormal gait, decreased activity tolerance, decreased endurance, decreased mobility, difficulty walking, decreased ROM, decreased strength, hypomobility, increased edema, impaired  perceived functional ability, impaired flexibility, impaired UE functional use, improper body mechanics, postural dysfunction, and pain.   ACTIVITY LIMITATIONS: carrying, lifting, bending,  sitting, standing, squatting, stairs, transfers, bed mobility, bathing, dressing, self feeding, reach over head, hygiene/grooming, locomotion level, and caring for others  PARTICIPATION LIMITATIONS: meal prep, cleaning, laundry, driving, shopping, community activity, occupation, and yard work  PERSONAL FACTORS: Age, Fitness, Profession, and 3+ comorbidities: anemia, arthritis (back, L elbow, L hip, L ankle), asthma, DM, GERD, headache, hyperparathyroidism, lymphedema, seizure, sleep apnea are also affecting patient's functional outcome.   REHAB POTENTIAL: Good  CLINICAL DECISION MAKING: Evolving/moderate complexity  EVALUATION COMPLEXITY: Moderate   GOALS: Goals reviewed with patient? Yes  SHORT TERM GOALS: Target date: 10/24/2023  Patient will be independent in home exercise program to improve strength/mobility for better functional independence with ADLs. Baseline:See HEP above; 11/17/2023=Patient able to verbalize and today demonstrate good understanding of HEP especially now since she has received the results from MRI.  Goal status: MET   LONG TERM GOALS: Target date: 11/28/2023  Patient will decrease Quick DASH score by > 8 points demonstrating reduced self-reported upper extremity disability. Baseline: 68.2/100; 11/17/2023- will assess next session 11/30/23:  Goal status: INITIAL  2.  Patient will improve L shoulder AROM to > 140 degrees of flexion, scaption, and abduction for improved ability to perform overhead activities. Baseline: avoided AROM due to surgical procedure Goal status: INITIAL  3.  Patient will report a worst pain of 6/10 on VAS in L shoulder to improve tolerance with ADLs and reduced symptoms with activities. Baseline: 10/10 at worst Goal status: INITIAL  4.  Patient will be able to tolerate holding her young grandson without an increase in L shoulder pain to show improved L UE function and increase pt's ability to participate in family care. Baseline: Pt is  unable to pick up grandson due to precautions of the L shoulder following RTC repair Goal status: ONGOING   PLAN:  PT FREQUENCY: 2x/week  PT DURATION: 8 weeks  PLANNED INTERVENTIONS: 97164- PT Re-evaluation, 97750- Physical Performance Testing, 97110-Therapeutic exercises, 97530- Therapeutic activity, 97112- Neuromuscular re-education, 97535- Self Care, [id number]- Manual therapy, [id number]- Gait training, [id number]- Orthotic Initial, [id number]- Orthotic/Prosthetic subsequent, [phone]- Canalith repositioning, [id number]- Splinting, [id number]- Electrical stimulation (manual), [id number]- Ultrasound, [id number]- Traction (mechanical), [id number]- Parrafin, [id number] (1-2 muscles), [id number] (3+ muscles)- Dry Needling, Patient/Family education, Balance training, Stair training, Taping, Joint mobilization, Spinal mobilization, Scar mobilization, Vestibular training, DME instructions, Cryotherapy, and Moist heat   PLAN FOR NEXT SESSION:   Continue PROM of the L shoulder for the next several weeks.   Fonda Simpers, PT, DPT Physical Therapist - [facility]  12/13/23, 9:36 AM

## 2023-12-14 DIAGNOSIS — E042 Nontoxic multinodular goiter: Secondary | ICD-10-CM | POA: Diagnosis not present

## 2023-12-18 ENCOUNTER — Ambulatory Visit: Attending: Orthopedic Surgery

## 2023-12-18 DIAGNOSIS — M25512 Pain in left shoulder: Secondary | ICD-10-CM | POA: Insufficient documentation

## 2023-12-18 DIAGNOSIS — M6281 Muscle weakness (generalized): Secondary | ICD-10-CM | POA: Insufficient documentation

## 2023-12-18 DIAGNOSIS — M25612 Stiffness of left shoulder, not elsewhere classified: Secondary | ICD-10-CM | POA: Insufficient documentation

## 2023-12-18 DIAGNOSIS — M25562 Pain in left knee: Secondary | ICD-10-CM | POA: Insufficient documentation

## 2023-12-18 NOTE — Therapy (Signed)
 OUTPATIENT PHYSICAL THERAPY L SHOULDER TREATMENT     Patient Name: Ashley [name] MRN: [id number] DOB:1966/01/19, 58 y.o., female Today's Date: 12/18/2023  END OF SESSION:  PT End of Session - 12/18/23 0819     Visit Number 3    Number of Visits 24    Date for Recertification  03/04/24    PT Start Time 0817    PT Stop Time 0845    PT Time Calculation (min) 28 min    Activity Tolerance Patient tolerated treatment well;Patient limited by pain    Behavior During Therapy [facility] for tasks assessed/performed           Past Medical History:  Diagnosis Date   Allergy    Anemia    Arthritis    back, left elbow, left hip, left ankle    Asthma    Blood transfusion without reported diagnosis    Complication of anesthesia    delayed emergence   Diabetes mellitus without complication (HCC)    diet controlled   Diverticulosis    GERD (gastroesophageal reflux disease)    Headache    History of hiatal hernia    History of kidney stones    Hyperparathyroidism (HCC)    Lymphedema    PONV (postoperative nausea and vomiting)    S/P subtotal parathyroidectomy    Seizure (HCC)    as a child   Sleep apnea    cannot tolerate mask   Thyroid  nodule 06/20/2018   Varicose vein of leg    Past Surgical History:  Procedure Laterality Date   ACETABULUM FRACTURE SURGERY Left    CESAREAN SECTION     CHOLECYSTECTOMY     COLONOSCOPY WITH PROPOFOL  N/A 05/31/2021   Procedure: COLONOSCOPY WITH PROPOFOL ;  Surgeon: Maryruth Ole DASEN, MD;  Location: [facility] ENDOSCOPY;  Service: Endoscopy;  Laterality: N/A;   ELBOW FRACTURE SURGERY  1995   ESOPHAGOGASTRODUODENOSCOPY (EGD) WITH PROPOFOL  N/A 05/31/2021   Procedure: ESOPHAGOGASTRODUODENOSCOPY (EGD) WITH PROPOFOL ;  Surgeon: Maryruth Ole DASEN, MD;  Location: [facility] ENDOSCOPY;  Service: Endoscopy;  Laterality: N/A;   FRACTURE SURGERY     left ankle complete repair, left elbow   HERNIA REPAIR     incisional   LAPAROSCOPIC GASTRIC SLEEVE  RESECTION  2014   PARATHYROIDECTOMY N/A 10/25/2018   Procedure: PARATHYROIDECTOMY, DIABETIC, SLEEP APNEA;  Surgeon: Marolyn Nest, MD;  Location: [facility] ORS;  Service: General;  Laterality: N/A;   TONSILLECTOMY AND ADENOIDECTOMY N/A 12/08/2015   Procedure: TONSILLECTOMY AND ADENOIDECTOMY;  Surgeon: Chinita Hasten, MD;  Location: [facility] ORS;  Service: ENT;  Laterality: N/A;   TOTAL HIP ARTHROPLASTY Left 09/22/2022   Procedure: Left posterior total hip arthroplasty;  Surgeon: Lorelle Hussar, MD;  Location: [facility] ORS;  Service: Orthopedics;  Laterality: Left;   TUBAL LIGATION     Patient Active Problem List   Diagnosis Date Noted   Osteoarthritis of left hip 09/22/2022   Side pain 08/16/2022   Musculoskeletal pain 08/16/2022   Varicose veins of both legs with edema 01/05/2021   Swelling of limb 12/30/2020   Diabetes (HCC) 12/30/2020   Lymphedema 12/30/2020   S/P subtotal parathyroidectomy 11/08/2018   Hyperparathyroidism (HCC) 06/20/2018   Thyroid  nodule 06/20/2018   Intractable pain 04/12/2015    PCP:  Ophelia Sage, MD  REFERRING PROVIDER: Norleen Gavel, MD  REFERRING DIAG:  [phone] (ICD-10-CM) - Left shoulder pain  M25.562 (ICD-10-CM) - Left knee pain    THERAPY DIAG:  Acute pain of left shoulder  Decreased ROM of left shoulder  Muscle  weakness (generalized)  Rationale for Evaluation and Treatment: Rehabilitation  ONSET DATE: 08/30/23  SUBJECTIVE:                                                                                                                                                                                      SUBJECTIVE STATEMENT:  Pt arrived to the clinic late, noting being caught in school traffic.  Pt is reporting increased pain in the shoulder and started on Friday.  Pt notes it feels a lot more stiff recently since the last visit.  Pt reports she is having 8/10 pain upon arrival.     Hand dominance: Right  PERTINENT HISTORY: Pt was seen at [facility]  ED on 08/30/23 after a fall sustained at work. Pt works in the emergency room and reported that she slipped on a wet spot and fell onto her left shoulder and knee to avoid falling onto her L hip. Per ED note: Physical exam is generally reassuring. Shoulder joint itself has normal mobility and I am not concerned about dislocation. No evidence of clavicular injury. Mild proximal humerus fracture is possible and the patient is concerned about the fact that she has had multiple prior orthopedic injuries/fractures due to relatively minor falls. We will proceed with x-rays and anticipate restricted movement with shoulder sling, RICE, etc. Pt underwent L THA last June and has previous history of elbow surgery that prevents full movement of L wrist. Pt reports that she wore the sling provided in ED for a couple of weeks after the fall but has not worn it since.  PMH includes anemia, arthritis (back, L elbow, L hip, L ankle), asthma, DM, GERD, headache, hyperparathyroidism, lymphedema, seizure, sleep apnea  PAIN: Reported on L shoulder: Are you having pain? Yes: NPRS scale: 4/10 currently, worst 10/10 with movement, best: 4/10  Pain location: anterior shoulder Pain description: Sharp when it hits. Aggravating factors: reaching overhead/behind head, movement Relieving factors: Voltaren, Biofreeze, lidocaine  patches, rest  PRECAUTIONS: None  RED FLAGS: None   WEIGHT BEARING RESTRICTIONS: No  FALLS:  Has patient fallen in last 6 months? Yes. Number of falls 1- pt reports that this incident is her only fall  LIVING ENVIRONMENT: Lives with: lives with their spouse Lives in: Mobile home Stairs: Yes: External: 4 steps; can reach both Has following equipment at home: Single point cane and Walker - 2 wheeled  OCCUPATION: Works in ED at [employer]- pt reports that the pain hasn't affected her job duties that much because she is R handed  PLOF: Independent  PATIENT GOALS: Pt wants to be able to hold her 20  month old grandson, get range of motion back to  use her L arm normally.  NEXT MD VISIT: 10/12/23  OBJECTIVE:  Note: Objective measures were completed at Evaluation unless otherwise noted.  DIAGNOSTIC FINDINGS:  DG Humerus L (08/30/23): FINDINGS: There is no evidence of an acute fracture or dislocation. Radiopaque surgical screws and fixation wires are seen within the left olecranon process and left radial head. Soft tissues are unremarkable.   IMPRESSION: 1. No acute osseous abnormality. 2. Postoperative changes of the left elbow.   PATIENT SURVEYS:   QuickDASH Score: 68.2 / 100 = 68.2 %  COGNITION: Overall cognitive status: Within functional limits for tasks assessed     SENSATION: WFL- bilateral UE and LE  POSTURE: Pt holds L UE in guarded position at rest. L shoulder in sling upon arrival, but doffs with ease.   UPPER EXTREMITY ROM:   Active ROM Right eval Left PROM eval  Shoulder flexion 155 90  Shoulder extension    Shoulder abduction 120 90  Shoulder adduction    Shoulder internal rotation 75 NT  Shoulder external rotation 50 NT  Elbow flexion    Elbow extension    Wrist flexion    Wrist extension    Wrist ulnar deviation    Wrist radial deviation    Wrist pronation    Wrist supination    (Blank rows = not tested)   UPPER EXTREMITY MMT:  MMT Right eval Left eval  Shoulder flexion 4 NT  Shoulder extension    Shoulder abduction 3+ NT  Shoulder adduction    Shoulder internal rotation    Shoulder external rotation    Middle trapezius    Lower trapezius    Elbow flexion 4 NT  Elbow extension 4 NT  Wrist flexion    Wrist extension    Wrist ulnar deviation    Wrist radial deviation    Wrist pronation    Wrist supination    Grip strength (lbs)    (Blank rows = not tested)    PALPATION:  Pt with expected tenderness around the incision sites and still covered with the bandaging.                                                                                                                                TREATMENT DATE: 12/18/23    Manual:   Supine STM with TP release to the L UT for tissue extensibility Supine STM with TP release to the L forearm extensor bundle for tissue extensibility and reduction of pain    TherEx:  Supine PROM of the L shoulder into flexion, abduction, IR, and ER within pain tolerable ranges.  Pt with soft end feels throughout the process and was advised to let the therapist know when feeling discomfort.  Pt able to achieve 110 deg of flexion and abduction without any pain, and ER ROM limited to ~30  and ~40 deg for IR.  Supine scapular retractions, x10    PATIENT EDUCATION: Education details: Exercise  technique Person educated: Patient Education method: Explanation Education comprehension: verbalized understanding  HOME EXERCISE PROGRAM: Access Code: [email or web address] URL: [email or web address] Date: 12/11/2023 Prepared by: Sidra Simpers  Exercises - Circular Shoulder Pendulum with Table Support  - 1 x daily - 7 x weekly - 3 sets - 10 reps - Flexion-Extension Shoulder Pendulum with Table Support  - 1 x daily - 7 x weekly - 3 sets - 10 reps - Horizontal Shoulder Pendulum with Table Support  - 1 x daily - 7 x weekly - 3 sets - 10 reps - Supine Scapular Retraction  - 1 x daily - 7 x weekly - 3 sets - 10 reps - Seated Shoulder Shrug Circles AROM Backward  - 1 x daily - 7 x weekly - 3 sets - 10 reps - Wrist AROM Flexion Extension  - 1 x daily - 7 x weekly - 3 sets - 10 reps - Elbow Flexion PROM  - 1 x daily - 7 x weekly - 3 sets - 10 reps - Elbow Extension PROM  - 1 x daily - 7 x weekly - 3 sets - 10 reps - Forearm Pronation PROM  - 1 x daily - 7 x weekly - 3 sets - 10 reps - Forearm Supination PROM  - 1 x daily - 7 x weekly - 3 sets - 10 reps   Access Code: [email or web address]  URL: [email or web address]  Date: 10/05/2023  Prepared by: Marina  Moser  Exercises  - Standing  shoulder flexion wall slides  - 1 x daily - 7 x weekly - 2 sets - 10 reps - 5 hold  - Standing Shoulder Abduction Slides at Wall  - 1 x daily - 7 x weekly - 2 sets - 10 reps - 5 hold  - Standing Isometric Shoulder Flexion with Doorway - Arm Bent  - 1 x daily - 7 x weekly - 2 sets - 10 reps - 5 hold  - Standing Isometric Shoulder Abduction with Doorway - Arm Bent  - 1 x daily - 7 x weekly - 2 sets - 10 reps - 5 hold  - Standing Isometric Shoulder Internal Rotation at Doorway  - 1 x daily - 7 x weekly - 2 sets - 10 reps - 5 hold  - Standing Isometric Shoulder Extension with Doorway - Arm Bent  - 1 x daily - 7 x weekly - 2 sets - 10 reps - 5 hold  - Seated Shoulder Pendulum Exercise  - 1 x daily - 7 x weekly - 2 sets - 10 reps - 5 hold   ASSESSMENT:  CLINICAL IMPRESSION:    Pt session limited due to pt's late arrival, however pt was able to continue to same ROM as the previous session although reporting increased pain upon arrival.  Pt will continue to perform the exercises and pt was encouraged to ice the shoulder when in pain.  Pt agreeable to HEP and ice recommendations.   Pt will continue to benefit from skilled therapy to address remaining deficits in order to improve overall QoL and return to PLOF.         OBJECTIVE IMPAIRMENTS: Abnormal gait, decreased activity tolerance, decreased endurance, decreased mobility, difficulty walking, decreased ROM, decreased strength, hypomobility, increased edema, impaired perceived functional ability, impaired flexibility, impaired UE functional use, improper body mechanics, postural dysfunction, and pain.   ACTIVITY LIMITATIONS: carrying, lifting, bending, sitting, standing, squatting, stairs, transfers, bed mobility, bathing, dressing, self feeding, reach over head, hygiene/grooming, locomotion level, and caring for others  PARTICIPATION LIMITATIONS: meal prep, cleaning, laundry, driving, shopping, community activity, occupation, and yard  work  PERSONAL FACTORS: Age, Fitness, Profession, and 3+ comorbidities: anemia, arthritis (back, L elbow, L hip, L ankle), asthma, DM, GERD, headache, hyperparathyroidism, lymphedema, seizure, sleep apnea are also affecting patient's functional outcome.   REHAB POTENTIAL: Good  CLINICAL DECISION MAKING: Evolving/moderate complexity  EVALUATION COMPLEXITY: Moderate   GOALS: Goals reviewed with patient? Yes  SHORT TERM GOALS: Target date: 10/24/2023  Patient will be independent in home exercise program to improve strength/mobility for better functional independence with ADLs. Baseline:See HEP above; 11/17/2023=Patient able to verbalize and today demonstrate good understanding of HEP especially now since she has received the results from MRI.  Goal status: MET   LONG TERM GOALS: Target date: 11/28/2023  Patient will decrease Quick DASH score by > 8 points demonstrating reduced self-reported upper extremity disability. Baseline: 68.2/100; 11/17/2023- will assess next session 11/30/23:  Goal status: INITIAL  2.  Patient will improve L shoulder AROM to > 140 degrees of flexion, scaption, and abduction for improved ability to perform overhead activities. Baseline: avoided AROM due to surgical procedure Goal status: INITIAL  3.  Patient will report a worst pain of 6/10 on VAS in L shoulder to improve tolerance with ADLs and reduced symptoms with activities. Baseline: 10/10 at worst Goal status: INITIAL  4.  Patient will be able to tolerate holding her young grandson without an increase in L shoulder pain to show improved L UE function and increase pt's ability to participate in family care. Baseline: Pt is unable to pick up grandson due to precautions of the L shoulder following RTC repair Goal status: ONGOING   PLAN:  PT FREQUENCY: 2x/week  PT DURATION: 8 weeks  PLANNED INTERVENTIONS: 97164- PT Re-evaluation, 97750- Physical Performance Testing, 97110-Therapeutic exercises, 97530-  Therapeutic activity, 97112- Neuromuscular re-education, 97535- Self Care, [id number]- Manual therapy, [id number]- Gait training, [id number]- Orthotic Initial, [id number]- Orthotic/Prosthetic subsequent, [phone]- Canalith repositioning, [id number]- Splinting, [id number]- Electrical stimulation (manual), [id number]- Ultrasound, [id number]- Traction (mechanical), [id number]- Parrafin, [id number] (1-2 muscles), [id number] (3+ muscles)- Dry Needling, Patient/Family education, Balance training, Stair training, Taping, Joint mobilization, Spinal mobilization, Scar mobilization, Vestibular training, DME instructions, Cryotherapy, and Moist heat   PLAN FOR NEXT SESSION:   Continue PROM of the L shoulder for the next several weeks.   Fonda Simpers, PT, DPT Physical Therapist - [facility]  12/18/23, 8:50 AM

## 2023-12-20 ENCOUNTER — Encounter

## 2023-12-21 ENCOUNTER — Ambulatory Visit

## 2023-12-21 ENCOUNTER — Other Ambulatory Visit: Payer: Self-pay

## 2023-12-21 DIAGNOSIS — E559 Vitamin D deficiency, unspecified: Secondary | ICD-10-CM | POA: Diagnosis not present

## 2023-12-21 DIAGNOSIS — M6281 Muscle weakness (generalized): Secondary | ICD-10-CM

## 2023-12-21 DIAGNOSIS — M25512 Pain in left shoulder: Secondary | ICD-10-CM

## 2023-12-21 DIAGNOSIS — M25612 Stiffness of left shoulder, not elsewhere classified: Secondary | ICD-10-CM

## 2023-12-21 DIAGNOSIS — E042 Nontoxic multinodular goiter: Secondary | ICD-10-CM | POA: Diagnosis not present

## 2023-12-21 DIAGNOSIS — E213 Hyperparathyroidism, unspecified: Secondary | ICD-10-CM | POA: Diagnosis not present

## 2023-12-21 MED ORDER — VITAMIN D (ERGOCALCIFEROL) 1.25 MG (50000 UNIT) PO CAPS
50000.0000 [IU] | ORAL_CAPSULE | ORAL | 4 refills | Status: AC
  Filled 2024-03-14: qty 12, 84d supply, fill #0

## 2023-12-21 NOTE — Therapy (Signed)
 OUTPATIENT PHYSICAL THERAPY L SHOULDER TREATMENT     Patient Name: Ashley [name] MRN: [id number] DOB:07-03-1965, 58 y.o., female Today's Date: 12/21/2023  END OF SESSION:  PT End of Session - 12/21/23 0734     Visit Number 4    Number of Visits 24    Date for Recertification  03/04/24    PT Start Time 0734    PT Stop Time 0800    PT Time Calculation (min) 26 min    Activity Tolerance Patient tolerated treatment well;Patient limited by pain    Behavior During Therapy [facility] for tasks assessed/performed          Past Medical History:  Diagnosis Date   Allergy    Anemia    Arthritis    back, left elbow, left hip, left ankle    Asthma    Blood transfusion without reported diagnosis    Complication of anesthesia    delayed emergence   Diabetes mellitus without complication (HCC)    diet controlled   Diverticulosis    GERD (gastroesophageal reflux disease)    Headache    History of hiatal hernia    History of kidney stones    Hyperparathyroidism    Lymphedema    PONV (postoperative nausea and vomiting)    S/P subtotal parathyroidectomy    Seizure (HCC)    as a child   Sleep apnea    cannot tolerate mask   Thyroid  nodule 06/20/2018   Varicose vein of leg    Past Surgical History:  Procedure Laterality Date   ACETABULUM FRACTURE SURGERY Left    CESAREAN SECTION     CHOLECYSTECTOMY     COLONOSCOPY WITH PROPOFOL  N/A 05/31/2021   Procedure: COLONOSCOPY WITH PROPOFOL ;  Surgeon: Maryruth Ole DASEN, MD;  Location: [facility];  Service: Endoscopy;  Laterality: N/A;   ELBOW FRACTURE SURGERY  1995   ESOPHAGOGASTRODUODENOSCOPY (EGD) WITH PROPOFOL  N/A 05/31/2021   Procedure: ESOPHAGOGASTRODUODENOSCOPY (EGD) WITH PROPOFOL ;  Surgeon: Maryruth Ole DASEN, MD;  Location: [facility];  Service: Endoscopy;  Laterality: N/A;   FRACTURE SURGERY     left ankle complete repair, left elbow   HERNIA REPAIR     incisional   LAPAROSCOPIC GASTRIC SLEEVE RESECTION   2014   PARATHYROIDECTOMY N/A 10/25/2018   Procedure: PARATHYROIDECTOMY, DIABETIC, SLEEP APNEA;  Surgeon: Marolyn Nest, MD;  Location: [facility] OR[facility];  Service: General;  Laterality: N/A;   TONSILLECTOMY AND ADENOIDECTOMY N/A 12/08/2015   Procedure: TONSILLECTOMY AND ADENOIDECTOMY;  Surgeon: Chinita Hasten, MD;  Location: [facility];  Service: ENT;  Laterality: N/A;   TOTAL HIP ARTHROPLASTY Left 09/22/2022   Procedure: Left posterior total hip arthroplasty;  Surgeon: Lorelle Hussar, MD;  Location: [facility] OR[facility];  Service: Orthopedics;  Laterality: Left;   TUBAL LIGATION     Patient Active Problem List   Diagnosis Date Noted   Osteoarthritis of left hip 09/22/2022   Side pain 08/16/2022   Musculoskeletal pain 08/16/2022   Varicose veins of both legs with edema 01/05/2021   Swelling of limb 12/30/2020   Diabetes (HCC) 12/30/2020   Lymphedema 12/30/2020   S/P subtotal parathyroidectomy 11/08/2018   Hyperparathyroidism 06/20/2018   Thyroid  nodule 06/20/2018   Intractable pain 04/12/2015    PCP:  Ophelia Sage, MD  REFERRING PROVIDER: Norleen Gavel, MD  REFERRING DIAG:  [id number] (ICD-10-CM) - Left shoulder pain  M25.562 (ICD-10-CM) - Left knee pain    THERAPY DIAG:  Acute pain of left shoulder  Decreased ROM of left shoulder  Muscle weakness (generalized)  Rationale for Evaluation and Treatment: Rehabilitation  ONSET DATE: 08/30/23  SUBJECTIVE:                                                                                                                                                                                      SUBJECTIVE STATEMENT:  Pt reports she feels like it was more mobile yesterday.  Pt feels like it has tightened up since then though.  Pt otherwise is doing well.    Pt reports she is having 3/10 pain upon arrival.     Hand dominance: Right  PERTINENT HISTORY: Pt was seen at [facility] ED on 08/30/23 after a fall sustained at work. Pt works in the emergency room and  reported that she slipped on a wet spot and fell onto her left shoulder and knee to avoid falling onto her L hip. Per ED note: Physical exam is generally reassuring. Shoulder joint itself has normal mobility and I am not concerned about dislocation. No evidence of clavicular injury. Mild proximal humerus fracture is possible and the patient is concerned about the fact that she has had multiple prior orthopedic injuries/fractures due to relatively minor falls. We will proceed with x-rays and anticipate restricted movement with shoulder sling, RICE, etc. Pt underwent L THA last June and has previous history of elbow surgery that prevents full movement of L wrist. Pt reports that she wore the sling provided in ED for a couple of weeks after the fall but has not worn it since.  PMH includes anemia, arthritis (back, L elbow, L hip, L ankle), asthma, DM, GERD, headache, hyperparathyroidism, lymphedema, seizure, sleep apnea  PAIN: Reported on L shoulder: Are you having pain? Yes: NPRS scale: 4/10 currently, worst 10/10 with movement, best: 4/10  Pain location: anterior shoulder Pain description: Sharp when it hits. Aggravating factors: reaching overhead/behind head, movement Relieving factors: Voltaren, Biofreeze, lidocaine  patches, rest  PRECAUTIONS: None  RED FLAGS: None   WEIGHT BEARING RESTRICTIONS: No  FALLS:  Has patient fallen in last 6 months? Yes. Number of falls 1- pt reports that this incident is her only fall  LIVING ENVIRONMENT: Lives with: lives with their spouse Lives in: Mobile home Stairs: Yes: External: 4 steps; can reach both Has following equipment at home: Single point cane and Walker - 2 wheeled  OCCUPATION: Works in ED at [employer]- pt reports that the pain hasn't affected her job duties that much because she is R handed  PLOF: Independent  PATIENT GOALS: Pt wants to be able to hold her 67 month old grandson, get range of motion back to use her L arm normally.  NEXT MD  VISIT: 10/12/23  OBJECTIVE:  Note: Objective measures were completed at Evaluation unless otherwise noted.  DIAGNOSTIC FINDINGS:  DG Humerus L (08/30/23): FINDINGS: There is no evidence of an acute fracture or dislocation. Radiopaque surgical screws and fixation wires are seen within the left olecranon process and left radial head. Soft tissues are unremarkable.   IMPRESSION: 1. No acute osseous abnormality. 2. Postoperative changes of the left elbow.   PATIENT SURVEYS:   QuickDASH Score: 68.2 / 100 = 68.2 %  COGNITION: Overall cognitive status: Within functional limits for tasks assessed     SENSATION: WFL- bilateral UE and LE  POSTURE: Pt holds L UE in guarded position at rest. L shoulder in sling upon arrival, but doffs with ease.   UPPER EXTREMITY ROM:   Active ROM Right eval Left PROM eval  Shoulder flexion 155 90  Shoulder extension    Shoulder abduction 120 90  Shoulder adduction    Shoulder internal rotation 75 NT  Shoulder external rotation 50 NT  Elbow flexion    Elbow extension    Wrist flexion    Wrist extension    Wrist ulnar deviation    Wrist radial deviation    Wrist pronation    Wrist supination    (Blank rows = not tested)   UPPER EXTREMITY MMT:  MMT Right eval Left eval  Shoulder flexion 4 NT  Shoulder extension    Shoulder abduction 3+ NT  Shoulder adduction    Shoulder internal rotation    Shoulder external rotation    Middle trapezius    Lower trapezius    Elbow flexion 4 NT  Elbow extension 4 NT  Wrist flexion    Wrist extension    Wrist ulnar deviation    Wrist radial deviation    Wrist pronation    Wrist supination    Grip strength (lbs)    (Blank rows = not tested)    PALPATION:  Pt with expected tenderness around the incision sites and still covered with the bandaging.                                                                                                                               TREATMENT DATE:  12/21/23   TherEx:  Supine PROM of the L shoulder into flexion, abduction, IR, and ER within pain tolerable ranges.  Pt with soft end feels throughout the process and was advised to let the therapist know when feeling discomfort.  Pt able to achieve 115 deg of flexion and abduction without any pain, and ER ROM limited to ~30  and ~40 deg for IR.  Supine scapular retractions, x10   Manual:   Supine STM with TP release to the L UT for tissue extensibility Supine STM with TP release to the L forearm extensor bundle for tissue extensibility and reduction of pain    PATIENT EDUCATION: Education details: Exercise technique Person educated: Patient Education method: Explanation Education comprehension: verbalized understanding  HOME EXERCISE PROGRAM:  Access Code: [email or web address] URL: [email or web address] Date: 12/11/2023 Prepared by: Sidra Simpers  Exercises - Circular Shoulder Pendulum with Table Support  - 1 x daily - 7 x weekly - 3 sets - 10 reps - Flexion-Extension Shoulder Pendulum with Table Support  - 1 x daily - 7 x weekly - 3 sets - 10 reps - Horizontal Shoulder Pendulum with Table Support  - 1 x daily - 7 x weekly - 3 sets - 10 reps - Supine Scapular Retraction  - 1 x daily - 7 x weekly - 3 sets - 10 reps - Seated Shoulder Shrug Circles AROM Backward  - 1 x daily - 7 x weekly - 3 sets - 10 reps - Wrist AROM Flexion Extension  - 1 x daily - 7 x weekly - 3 sets - 10 reps - Elbow Flexion PROM  - 1 x daily - 7 x weekly - 3 sets - 10 reps - Elbow Extension PROM  - 1 x daily - 7 x weekly - 3 sets - 10 reps - Forearm Pronation PROM  - 1 x daily - 7 x weekly - 3 sets - 10 reps - Forearm Supination PROM  - 1 x daily - 7 x weekly - 3 sets - 10 reps   Access Code: [email or web address]  URL: [email or web address]  Date: 10/05/2023  Prepared by: Marina  Moser  Exercises  - Standing shoulder flexion wall slides  - 1 x daily - 7 x weekly - 2 sets - 10 reps - 5 hold  -  Standing Shoulder Abduction Slides at Wall  - 1 x daily - 7 x weekly - 2 sets - 10 reps - 5 hold  - Standing Isometric Shoulder Flexion with Doorway - Arm Bent  - 1 x daily - 7 x weekly - 2 sets - 10 reps - 5 hold  - Standing Isometric Shoulder Abduction with Doorway - Arm Bent  - 1 x daily - 7 x weekly - 2 sets - 10 reps - 5 hold  - Standing Isometric Shoulder Internal Rotation at Doorway  - 1 x daily - 7 x weekly - 2 sets - 10 reps - 5 hold  - Standing Isometric Shoulder Extension with Doorway - Arm Bent  - 1 x daily - 7 x weekly - 2 sets - 10 reps - 5 hold  - Seated Shoulder Pendulum Exercise  - 1 x daily - 7 x weekly - 2 sets - 10 reps - 5 hold   ASSESSMENT:  CLINICAL IMPRESSION:    Pt with late arrival to the clinic which consistently limits treatment session.  Pt is moving well with PROM and is able to improve overall ROM as noted, and continues to have soft end feel with all the passive mobility.  Pt has follow up soon with MD and will continue to improve overall ROM in preparation for upcoming AAROM.   Pt will continue to benefit from skilled therapy to address remaining deficits in order to improve overall QoL and return to PLOF.            OBJECTIVE IMPAIRMENTS: Abnormal gait, decreased activity tolerance, decreased endurance, decreased mobility, difficulty walking, decreased ROM, decreased strength, hypomobility, increased edema, impaired perceived functional ability, impaired flexibility, impaired UE functional use, improper body mechanics, postural dysfunction, and pain.   ACTIVITY LIMITATIONS: carrying, lifting, bending, sitting, standing, squatting, stairs, transfers, bed mobility, bathing, dressing, self feeding, reach over head, hygiene/grooming, locomotion level, and caring for others  PARTICIPATION LIMITATIONS: meal prep, cleaning, laundry,  driving, shopping, community activity, occupation, and yard work  PERSONAL FACTORS: Age, Fitness, Profession, and 3+ comorbidities:  anemia, arthritis (back, L elbow, L hip, L ankle), asthma, DM, GERD, headache, hyperparathyroidism, lymphedema, seizure, sleep apnea are also affecting patient's functional outcome.   REHAB POTENTIAL: Good  CLINICAL DECISION MAKING: Evolving/moderate complexity  EVALUATION COMPLEXITY: Moderate   GOALS: Goals reviewed with patient? Yes  SHORT TERM GOALS: Target date: 10/24/2023  Patient will be independent in home exercise program to improve strength/mobility for better functional independence with ADLs. Baseline:See HEP above; 11/17/2023=Patient able to verbalize and today demonstrate good understanding of HEP especially now since she has received the results from MRI.  Goal status: MET   LONG TERM GOALS: Target date: 11/28/2023  Patient will decrease Quick DASH score by > 8 points demonstrating reduced self-reported upper extremity disability. Baseline: 68.2/100; 11/17/2023- will assess next session 11/30/23:  Goal status: INITIAL  2.  Patient will improve L shoulder AROM to > 140 degrees of flexion, scaption, and abduction for improved ability to perform overhead activities. Baseline: avoided AROM due to surgical procedure Goal status: INITIAL  3.  Patient will report a worst pain of 6/10 on VAS in L shoulder to improve tolerance with ADLs and reduced symptoms with activities. Baseline: 10/10 at worst Goal status: INITIAL  4.  Patient will be able to tolerate holding her young grandson without an increase in L shoulder pain to show improved L UE function and increase pt's ability to participate in family care. Baseline: Pt is unable to pick up grandson due to precautions of the L shoulder following RTC repair Goal status: ONGOING   PLAN:  PT FREQUENCY: 2x/week  PT DURATION: 8 weeks  PLANNED INTERVENTIONS: 97164- PT Re-evaluation, 97750- Physical Performance Testing, 97110-Therapeutic exercises, 97530- Therapeutic activity, 97112- Neuromuscular re-education, 97535- Self Care,  97140- Manual therapy, [id number]- Gait training, 02239- Orthotic Initial, [id number]- Orthotic/Prosthetic subsequent, [phone]- Canalith repositioning, 02239- Splinting, 02967- Electrical stimulation (manual), [id number]- Ultrasound, [id number]- Traction (mechanical), [id number]- Parrafin, 79439 (1-2 muscles), 20561 (3+ muscles)- Dry Needling, Patient/Family education, Balance training, Stair training, Taping, Joint mobilization, Spinal mobilization, Scar mobilization, Vestibular training, DME instructions, Cryotherapy, and Moist heat   PLAN FOR NEXT SESSION:   Continue PROM of the L shoulder for the next several weeks.   Fonda Simpers, PT, DPT Physical Therapist - [facility]  12/21/23, 8:00 AM
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
## 2023-12-25 ENCOUNTER — Encounter

## 2023-12-26 ENCOUNTER — Other Ambulatory Visit: Payer: Self-pay

## 2023-12-26 ENCOUNTER — Ambulatory Visit

## 2023-12-26 DIAGNOSIS — M25562 Pain in left knee: Secondary | ICD-10-CM

## 2023-12-26 DIAGNOSIS — M6281 Muscle weakness (generalized): Secondary | ICD-10-CM

## 2023-12-26 DIAGNOSIS — M25512 Pain in left shoulder: Secondary | ICD-10-CM

## 2023-12-26 DIAGNOSIS — M25612 Stiffness of left shoulder, not elsewhere classified: Secondary | ICD-10-CM

## 2023-12-26 NOTE — Therapy (Signed)
 OUTPATIENT PHYSICAL THERAPY L SHOULDER TREATMENT     Patient Name: Ashley [name] MRN: [id number] DOB:1965-05-12, 58 y.o., female Today's Date: 12/26/2023  END OF SESSION:  PT End of Session - 12/26/23 0811     Visit Number 5    Number of Visits 24    Date for Recertification  03/04/24    PT Start Time 0808    PT Stop Time 0846    PT Time Calculation (min) 38 min    Activity Tolerance Patient tolerated treatment well;Patient limited by pain    Behavior During Therapy [facility] for tasks assessed/performed           Past Medical History:  Diagnosis Date   Allergy    Anemia    Arthritis    back, left elbow, left hip, left ankle    Asthma    Blood transfusion without reported diagnosis    Complication of anesthesia    delayed emergence   Diabetes mellitus without complication (HCC)    diet controlled   Diverticulosis    GERD (gastroesophageal reflux disease)    Headache    History of hiatal hernia    History of kidney stones    Hyperparathyroidism    Lymphedema    PONV (postoperative nausea and vomiting)    S/P subtotal parathyroidectomy    Seizure (HCC)    as a child   Sleep apnea    cannot tolerate mask   Thyroid  nodule 06/20/2018   Varicose vein of leg    Past Surgical History:  Procedure Laterality Date   ACETABULUM FRACTURE SURGERY Left    CESAREAN SECTION     CHOLECYSTECTOMY     COLONOSCOPY WITH PROPOFOL  N/A 05/31/2021   Procedure: COLONOSCOPY WITH PROPOFOL ;  Surgeon: Maryruth Ole DASEN, MD;  Location: [facility] ENDOSCOPY;  Service: Endoscopy;  Laterality: N/A;   ELBOW FRACTURE SURGERY  1995   ESOPHAGOGASTRODUODENOSCOPY (EGD) WITH PROPOFOL  N/A 05/31/2021   Procedure: ESOPHAGOGASTRODUODENOSCOPY (EGD) WITH PROPOFOL ;  Surgeon: Maryruth Ole DASEN, MD;  Location: [facility] ENDOSCOPY;  Service: Endoscopy;  Laterality: N/A;   FRACTURE SURGERY     left ankle complete repair, left elbow   HERNIA REPAIR     incisional   LAPAROSCOPIC GASTRIC SLEEVE RESECTION   2014   PARATHYROIDECTOMY N/A 10/25/2018   Procedure: PARATHYROIDECTOMY, DIABETIC, SLEEP APNEA;  Surgeon: Marolyn Nest, MD;  Location: [facility] ORS;  Service: General;  Laterality: N/A;   TONSILLECTOMY AND ADENOIDECTOMY N/A 12/08/2015   Procedure: TONSILLECTOMY AND ADENOIDECTOMY;  Surgeon: Chinita Hasten, MD;  Location: [facility] ORS;  Service: ENT;  Laterality: N/A;   TOTAL HIP ARTHROPLASTY Left 09/22/2022   Procedure: Left posterior total hip arthroplasty;  Surgeon: Lorelle Hussar, MD;  Location: [facility] ORS;  Service: Orthopedics;  Laterality: Left;   TUBAL LIGATION     Patient Active Problem List   Diagnosis Date Noted   Osteoarthritis of left hip 09/22/2022   Side pain 08/16/2022   Musculoskeletal pain 08/16/2022   Varicose veins of both legs with edema 01/05/2021   Swelling of limb 12/30/2020   Diabetes (HCC) 12/30/2020   Lymphedema 12/30/2020   S/P subtotal parathyroidectomy 11/08/2018   Hyperparathyroidism 06/20/2018   Thyroid  nodule 06/20/2018   Intractable pain 04/12/2015    PCP:  Ophelia Sage, MD  REFERRING PROVIDER: Norleen Gavel, MD  REFERRING DIAG:  [phone] (ICD-10-CM) - Left shoulder pain  M25.562 (ICD-10-CM) - Left knee pain    THERAPY DIAG:  Acute pain of left shoulder  Decreased ROM of left shoulder  Muscle weakness (generalized)  Acute pain of left knee  Rationale for Evaluation and Treatment: Rehabilitation  ONSET DATE: 08/30/23  SUBJECTIVE:                                                                                                                                                                                      SUBJECTIVE STATEMENT:  Pt reports having a good birthday going out with friends.  Pt notes 4/10 pain today on the L shoulder today, noting it's likely due to the weather.  Pt is at 4 weeks starting tomorrow 12/27/23.   Hand dominance: Right  PERTINENT HISTORY: Pt was seen at [facility] ED on 08/30/23 after a fall sustained at work. Pt works in  the emergency room and reported that she slipped on a wet spot and fell onto her left shoulder and knee to avoid falling onto her L hip. Per ED note: Physical exam is generally reassuring. Shoulder joint itself has normal mobility and I am not concerned about dislocation. No evidence of clavicular injury. Mild proximal humerus fracture is possible and the patient is concerned about the fact that she has had multiple prior orthopedic injuries/fractures due to relatively minor falls. We will proceed with x-rays and anticipate restricted movement with shoulder sling, RICE, etc. Pt underwent L THA last June and has previous history of elbow surgery that prevents full movement of L wrist. Pt reports that she wore the sling provided in ED for a couple of weeks after the fall but has not worn it since.  PMH includes anemia, arthritis (back, L elbow, L hip, L ankle), asthma, DM, GERD, headache, hyperparathyroidism, lymphedema, seizure, sleep apnea  PAIN: Reported on L shoulder: Are you having pain? Yes: NPRS scale: 4/10 currently, worst 10/10 with movement, best: 4/10  Pain location: anterior shoulder Pain description: Sharp when it hits. Aggravating factors: reaching overhead/behind head, movement Relieving factors: Voltaren, Biofreeze, lidocaine  patches, rest  PRECAUTIONS: None  RED FLAGS: None   WEIGHT BEARING RESTRICTIONS: No  FALLS:  Has patient fallen in last 6 months? Yes. Number of falls 1- pt reports that this incident is her only fall  LIVING ENVIRONMENT: Lives with: lives with their spouse Lives in: Mobile home Stairs: Yes: External: 4 steps; can reach both Has following equipment at home: Single point cane and Walker - 2 wheeled  OCCUPATION: Works in ED at [employer]- pt reports that the pain hasn't affected her job duties that much because she is R handed  PLOF: Independent  PATIENT GOALS: Pt wants to be able to hold her 16 month old grandson, get range of motion back to use her L arm  normally.  NEXT MD VISIT: 10/12/23  OBJECTIVE:  Note: Objective measures were completed at Evaluation unless otherwise noted.  DIAGNOSTIC FINDINGS:  DG Humerus L (08/30/23): FINDINGS: There is no evidence of an acute fracture or dislocation. Radiopaque surgical screws and fixation wires are seen within the left olecranon process and left radial head. Soft tissues are unremarkable.   IMPRESSION: 1. No acute osseous abnormality. 2. Postoperative changes of the left elbow.   PATIENT SURVEYS:   QuickDASH Score: 68.2 / 100 = 68.2 %  COGNITION: Overall cognitive status: Within functional limits for tasks assessed     SENSATION: WFL- bilateral UE and LE  POSTURE: Pt holds L UE in guarded position at rest. L shoulder in sling upon arrival, but doffs with ease.   UPPER EXTREMITY ROM:   Active ROM Right eval Left PROM eval  Shoulder flexion 155 90  Shoulder extension    Shoulder abduction 120 90  Shoulder adduction    Shoulder internal rotation 75 NT  Shoulder external rotation 50 NT  Elbow flexion    Elbow extension    Wrist flexion    Wrist extension    Wrist ulnar deviation    Wrist radial deviation    Wrist pronation    Wrist supination    (Blank rows = not tested)   UPPER EXTREMITY MMT:  MMT Right eval Left eval  Shoulder flexion 4 NT  Shoulder extension    Shoulder abduction 3+ NT  Shoulder adduction    Shoulder internal rotation    Shoulder external rotation    Middle trapezius    Lower trapezius    Elbow flexion 4 NT  Elbow extension 4 NT  Wrist flexion    Wrist extension    Wrist ulnar deviation    Wrist radial deviation    Wrist pronation    Wrist supination    Grip strength (lbs)    (Blank rows = not tested)    PALPATION:  Pt with expected tenderness around the incision sites and still covered with the bandaging.                                                                                                                                TREATMENT DATE: 12/26/23   TherEx:  Supine PROM of the L shoulder into flexion, abduction, IR, and ER within pain tolerable ranges.  Pt with soft end feels throughout the process and was advised to let the therapist know when feeling discomfort.  Pt able to achieve 106 deg of flexion and 108 deg of abduction without any pain, and ER ROM limited to ~30  and ~40 deg for IR.  Supine PROM of the L elbow into flexion, extension, supination/pronation to pain tolerable levels  Supine scapular retractions, 2x10  Standing pendulums in all directions (forward/retro, lateral, CW/CCW), 30 sec bouts each direction   Manual:   Supine STM with TP release to the L UT for tissue extensibility Supine STM to the L biceps and  deltoid region for pain modulation and improved tissue extensibility Supine STM with TP release to the L forearm extensor bundle for tissue extensibility and reduction of pain    PATIENT EDUCATION: Education details: Exercise technique Person educated: Patient Education method: Explanation Education comprehension: verbalized understanding  HOME EXERCISE PROGRAM: Access Code: [email or web address] URL: [email or web address] Date: 12/11/2023 Prepared by: Sidra Simpers  Exercises - Circular Shoulder Pendulum with Table Support  - 1 x daily - 7 x weekly - 3 sets - 10 reps - Flexion-Extension Shoulder Pendulum with Table Support  - 1 x daily - 7 x weekly - 3 sets - 10 reps - Horizontal Shoulder Pendulum with Table Support  - 1 x daily - 7 x weekly - 3 sets - 10 reps - Supine Scapular Retraction  - 1 x daily - 7 x weekly - 3 sets - 10 reps - Seated Shoulder Shrug Circles AROM Backward  - 1 x daily - 7 x weekly - 3 sets - 10 reps - Wrist AROM Flexion Extension  - 1 x daily - 7 x weekly - 3 sets - 10 reps - Elbow Flexion PROM  - 1 x daily - 7 x weekly - 3 sets - 10 reps - Elbow Extension PROM  - 1 x daily - 7 x weekly - 3 sets - 10 reps - Forearm Pronation PROM  - 1 x daily - 7  x weekly - 3 sets - 10 reps - Forearm Supination PROM  - 1 x daily - 7 x weekly - 3 sets - 10 reps   Access Code: [email or web address]  URL: [email or web address]  Date: 10/05/2023  Prepared by: Marina  Moser  Exercises  - Standing shoulder flexion wall slides  - 1 x daily - 7 x weekly - 2 sets - 10 reps - 5 hold  - Standing Shoulder Abduction Slides at Wall  - 1 x daily - 7 x weekly - 2 sets - 10 reps - 5 hold  - Standing Isometric Shoulder Flexion with Doorway - Arm Bent  - 1 x daily - 7 x weekly - 2 sets - 10 reps - 5 hold  - Standing Isometric Shoulder Abduction with Doorway - Arm Bent  - 1 x daily - 7 x weekly - 2 sets - 10 reps - 5 hold  - Standing Isometric Shoulder Internal Rotation at Doorway  - 1 x daily - 7 x weekly - 2 sets - 10 reps - 5 hold  - Standing Isometric Shoulder Extension with Doorway - Arm Bent  - 1 x daily - 7 x weekly - 2 sets - 10 reps - 5 hold  - Seated Shoulder Pendulum Exercise  - 1 x daily - 7 x weekly - 2 sets - 10 reps - 5 hold   ASSESSMENT:  CLINICAL IMPRESSION:    Pt a little more stiff within the shoulder with PROM today, but overall is doing well still.  Pt still has difficulty relaxing at times and even self-reports trying to mobilize it at times to clean the R side of her body with the L arm.  Pt instructed on that being outside of the protocol and she needs to make sure not to perform AROM at this time.  Pt voiced understanding.  Pt otherwise will continue as requested to be PROM only for the first 6 weeks.   Pt will continue to benefit from skilled therapy to address remaining deficits in order to improve overall QoL and return to PLOF.  OBJECTIVE IMPAIRMENTS: Abnormal gait, decreased activity tolerance, decreased endurance, decreased mobility, difficulty walking, decreased ROM, decreased strength, hypomobility, increased edema, impaired perceived functional ability, impaired flexibility, impaired UE functional use, improper body mechanics,  postural dysfunction, and pain.   ACTIVITY LIMITATIONS: carrying, lifting, bending, sitting, standing, squatting, stairs, transfers, bed mobility, bathing, dressing, self feeding, reach over head, hygiene/grooming, locomotion level, and caring for others  PARTICIPATION LIMITATIONS: meal prep, cleaning, laundry, driving, shopping, community activity, occupation, and yard work  PERSONAL FACTORS: Age, Fitness, Profession, and 3+ comorbidities: anemia, arthritis (back, L elbow, L hip, L ankle), asthma, DM, GERD, headache, hyperparathyroidism, lymphedema, seizure, sleep apnea are also affecting patient's functional outcome.   REHAB POTENTIAL: Good  CLINICAL DECISION MAKING: Evolving/moderate complexity  EVALUATION COMPLEXITY: Moderate   GOALS: Goals reviewed with patient? Yes  SHORT TERM GOALS: Target date: 10/24/2023  Patient will be independent in home exercise program to improve strength/mobility for better functional independence with ADLs. Baseline:See HEP above; 11/17/2023=Patient able to verbalize and today demonstrate good understanding of HEP especially now since she has received the results from MRI.  Goal status: MET   LONG TERM GOALS: Target date: 11/28/2023  Patient will decrease Quick DASH score by > 8 points demonstrating reduced self-reported upper extremity disability. Baseline: 68.2/100; 11/17/2023- will assess next session 11/30/23:  Goal status: INITIAL  2.  Patient will improve L shoulder AROM to > 140 degrees of flexion, scaption, and abduction for improved ability to perform overhead activities. Baseline: avoided AROM due to surgical procedure Goal status: INITIAL  3.  Patient will report a worst pain of 6/10 on VAS in L shoulder to improve tolerance with ADLs and reduced symptoms with activities. Baseline: 10/10 at worst Goal status: INITIAL  4.  Patient will be able to tolerate holding her young grandson without an increase in L shoulder pain to show improved L UE  function and increase pt's ability to participate in family care. Baseline: Pt is unable to pick up grandson due to precautions of the L shoulder following RTC repair Goal status: ONGOING   PLAN:  PT FREQUENCY: 2x/week  PT DURATION: 8 weeks  PLANNED INTERVENTIONS: 97164- PT Re-evaluation, 97750- Physical Performance Testing, 97110-Therapeutic exercises, 97530- Therapeutic activity, 97112- Neuromuscular re-education, 97535- Self Care, [id number]- Manual therapy, [id number]- Gait training, [id number]- Orthotic Initial, [id number]- Orthotic/Prosthetic subsequent, [phone]- Canalith repositioning, [id number]- Splinting, [id number]- Electrical stimulation (manual), [id number]- Ultrasound, [id number]- Traction (mechanical), [id number]- Parrafin, [id number] (1-2 muscles), [id number] (3+ muscles)- Dry Needling, Patient/Family education, Balance training, Stair training, Taping, Joint mobilization, Spinal mobilization, Scar mobilization, Vestibular training, DME instructions, Cryotherapy, and Moist heat   PLAN FOR NEXT SESSION:    Continue PROM of the L shoulder for the next several weeks.   Fonda Simpers, PT, DPT Physical Therapist - [facility]  12/26/23, 8:54 AM

## 2023-12-27 ENCOUNTER — Ambulatory Visit: Payer: Worker's Compensation

## 2023-12-28 ENCOUNTER — Other Ambulatory Visit: Payer: Self-pay

## 2023-12-28 ENCOUNTER — Ambulatory Visit: Payer: Worker's Compensation | Attending: Orthopedic Surgery

## 2023-12-28 DIAGNOSIS — M25612 Stiffness of left shoulder, not elsewhere classified: Secondary | ICD-10-CM | POA: Diagnosis present

## 2023-12-28 DIAGNOSIS — M25512 Pain in left shoulder: Secondary | ICD-10-CM | POA: Insufficient documentation

## 2023-12-28 DIAGNOSIS — M6281 Muscle weakness (generalized): Secondary | ICD-10-CM | POA: Diagnosis present

## 2023-12-28 DIAGNOSIS — M25562 Pain in left knee: Secondary | ICD-10-CM | POA: Insufficient documentation

## 2023-12-28 MED ORDER — TRAMADOL HCL 50 MG PO TABS
50.0000 mg | ORAL_TABLET | Freq: Four times a day (QID) | ORAL | 0 refills | Status: AC | PRN
  Filled 2023-12-28: qty 30, 8d supply, fill #0

## 2023-12-28 MED ORDER — HYDROCODONE-ACETAMINOPHEN 5-325 MG PO TABS
1.0000 | ORAL_TABLET | Freq: Four times a day (QID) | ORAL | 0 refills | Status: AC | PRN
  Filled 2023-12-28: qty 20, 5d supply, fill #0

## 2023-12-28 NOTE — Therapy (Signed)
 OUTPATIENT PHYSICAL THERAPY L SHOULDER TREATMENT     Patient Name: Ashley [name] MRN: [id number] DOB:[birth year], 58 y.o., female Today's Date: 12/28/2023  END OF SESSION:  PT End of Session - 12/28/23 0822     Visit Number 6    Number of Visits 24    Date for Recertification  03/04/24    PT Start Time 0820    PT Stop Time 0845    PT Time Calculation (min) 25 min    Activity Tolerance Patient tolerated treatment well;Patient limited by pain    Behavior During Therapy [facility] for tasks assessed/performed            Past Medical History:  Diagnosis Date   Allergy    Anemia    Arthritis    back, left elbow, left hip, left ankle    Asthma    Blood transfusion without reported diagnosis    Complication of anesthesia    delayed emergence   Diabetes mellitus without complication (HCC)    diet controlled   Diverticulosis    GERD (gastroesophageal reflux disease)    Headache    History of hiatal hernia    History of kidney stones    Hyperparathyroidism    Lymphedema    PONV (postoperative nausea and vomiting)    S/P subtotal parathyroidectomy    Seizure (HCC)    as a child   Sleep apnea    cannot tolerate mask   Thyroid  nodule 06/20/2018   Varicose vein of leg    Past Surgical History:  Procedure Laterality Date   ACETABULUM FRACTURE SURGERY Left    CESAREAN SECTION     CHOLECYSTECTOMY     COLONOSCOPY WITH PROPOFOL  N/A 05/31/2021   Procedure: COLONOSCOPY WITH PROPOFOL ;  Surgeon: Maryruth Ole DASEN, MD;  Location: [facility] ENDOSCOPY;  Service: Endoscopy;  Laterality: N/A;   ELBOW FRACTURE SURGERY  1995   ESOPHAGOGASTRODUODENOSCOPY (EGD) WITH PROPOFOL  N/A 05/31/2021   Procedure: ESOPHAGOGASTRODUODENOSCOPY (EGD) WITH PROPOFOL ;  Surgeon: Maryruth Ole DASEN, MD;  Location: [facility] ENDOSCOPY;  Service: Endoscopy;  Laterality: N/A;   FRACTURE SURGERY     left ankle complete repair, left elbow   HERNIA REPAIR     incisional   LAPAROSCOPIC GASTRIC SLEEVE  RESECTION  2014   PARATHYROIDECTOMY N/A 10/25/2018   Procedure: PARATHYROIDECTOMY, DIABETIC, SLEEP APNEA;  Surgeon: Marolyn Nest, MD;  Location: [facility] ORS;  Service: General;  Laterality: N/A;   TONSILLECTOMY AND ADENOIDECTOMY N/A 12/08/2015   Procedure: TONSILLECTOMY AND ADENOIDECTOMY;  Surgeon: Chinita Hasten, MD;  Location: [facility] ORS;  Service: ENT;  Laterality: N/A;   TOTAL HIP ARTHROPLASTY Left 09/22/2022   Procedure: Left posterior total hip arthroplasty;  Surgeon: Lorelle Hussar, MD;  Location: [facility] ORS;  Service: Orthopedics;  Laterality: Left;   TUBAL LIGATION     Patient Active Problem List   Diagnosis Date Noted   Osteoarthritis of left hip 09/22/2022   Side pain 08/16/2022   Musculoskeletal pain 08/16/2022   Varicose veins of both legs with edema 01/05/2021   Swelling of limb 12/30/2020   Diabetes (HCC) 12/30/2020   Lymphedema 12/30/2020   S/P subtotal parathyroidectomy 11/08/2018   Hyperparathyroidism 06/20/2018   Thyroid  nodule 06/20/2018   Intractable pain 04/12/2015    PCP:  Ophelia Sage, MD  REFERRING PROVIDER: Norleen Gavel, MD  REFERRING DIAG:  [phone] (ICD-10-CM) - Left shoulder pain  M25.562 (ICD-10-CM) - Left knee pain    THERAPY DIAG:  Acute pain of left shoulder  Decreased ROM of left shoulder  Muscle weakness (  generalized)  Acute pain of left knee  Rationale for Evaluation and Treatment: Rehabilitation  ONSET DATE: 08/30/23  SUBJECTIVE:                                                                                                                                                                                      SUBJECTIVE STATEMENT:  Pt reports that she's seeing MD today.  Pt is noting significant pain noted in the L knee and is hoping to get an injection to assist.  Pt is at 4 weeks starting tomorrow 12/27/23.   Hand dominance: Right  PERTINENT HISTORY: Pt was seen at [facility] ED on 08/30/23 after a fall sustained at work. Pt works in the  emergency room and reported that she slipped on a wet spot and fell onto her left shoulder and knee to avoid falling onto her L hip. Per ED note: Physical exam is generally reassuring. Shoulder joint itself has normal mobility and I am not concerned about dislocation. No evidence of clavicular injury. Mild proximal humerus fracture is possible and the patient is concerned about the fact that she has had multiple prior orthopedic injuries/fractures due to relatively minor falls. We will proceed with x-rays and anticipate restricted movement with shoulder sling, RICE, etc. Pt underwent L THA last June and has previous history of elbow surgery that prevents full movement of L wrist. Pt reports that she wore the sling provided in ED for a couple of weeks after the fall but has not worn it since.  PMH includes anemia, arthritis (back, L elbow, L hip, L ankle), asthma, DM, GERD, headache, hyperparathyroidism, lymphedema, seizure, sleep apnea  PAIN: Reported on L shoulder: Are you having pain? Yes: NPRS scale: 4/10 currently, worst 10/10 with movement, best: 4/10  Pain location: anterior shoulder Pain description: Sharp when it hits. Aggravating factors: reaching overhead/behind head, movement Relieving factors: Voltaren, Biofreeze, lidocaine  patches, rest  PRECAUTIONS: None  RED FLAGS: None   WEIGHT BEARING RESTRICTIONS: No  FALLS:  Has patient fallen in last 6 months? Yes. Number of falls 1- pt reports that this incident is her only fall  LIVING ENVIRONMENT: Lives with: lives with their spouse Lives in: Mobile home Stairs: Yes: External: 4 steps; can reach both Has following equipment at home: Single point cane and Walker - 2 wheeled  OCCUPATION: Works in ED at [employer]- pt reports that the pain hasn't affected her job duties that much because she is R handed  PLOF: Independent  PATIENT GOALS: Pt wants to be able to hold her 67 month old grandson, get range of motion back to use her L arm  normally.  NEXT MD VISIT: 10/12/23  OBJECTIVE:  Note: Objective measures were completed at Evaluation unless otherwise noted.  DIAGNOSTIC FINDINGS:  DG Humerus L (08/30/23): FINDINGS: There is no evidence of an acute fracture or dislocation. Radiopaque surgical screws and fixation wires are seen within the left olecranon process and left radial head. Soft tissues are unremarkable.   IMPRESSION: 1. No acute osseous abnormality. 2. Postoperative changes of the left elbow.   PATIENT SURVEYS:   QuickDASH Score: 68.2 / 100 = 68.2 %  COGNITION: Overall cognitive status: Within functional limits for tasks assessed     SENSATION: WFL- bilateral UE and LE  POSTURE: Pt holds L UE in guarded position at rest. L shoulder in sling upon arrival, but doffs with ease.   UPPER EXTREMITY ROM:   Active ROM Right eval Left PROM eval  Shoulder flexion 155 90  Shoulder extension    Shoulder abduction 120 90  Shoulder adduction    Shoulder internal rotation 75 NT  Shoulder external rotation 50 NT  Elbow flexion    Elbow extension    Wrist flexion    Wrist extension    Wrist ulnar deviation    Wrist radial deviation    Wrist pronation    Wrist supination    (Blank rows = not tested)   UPPER EXTREMITY MMT:  MMT Right eval Left eval  Shoulder flexion 4 NT  Shoulder extension    Shoulder abduction 3+ NT  Shoulder adduction    Shoulder internal rotation    Shoulder external rotation    Middle trapezius    Lower trapezius    Elbow flexion 4 NT  Elbow extension 4 NT  Wrist flexion    Wrist extension    Wrist ulnar deviation    Wrist radial deviation    Wrist pronation    Wrist supination    Grip strength (lbs)    (Blank rows = not tested)    PALPATION:  Pt with expected tenderness around the incision sites and still covered with the bandaging.                                                                                                                                TREATMENT DATE: 12/28/23  TherEx:  Supine PROM of the L shoulder into flexion, abduction, IR, and ER within pain tolerable ranges.  Pt with soft end feels throughout the process and was advised to let the therapist know when feeling discomfort.  Pt able to achieve 127 deg of flexion and 112 deg of abduction without any pain, and ER ROM limited to ~30  and ~40 deg for IR.  Supine PROM of the L elbow into flexion, extension, supination/pronation to pain tolerable levels   Manual:   Supine STM with TP release to the L UT for tissue extensibility Supine STM to the L biceps and deltoid region for pain modulation and improved tissue extensibility Supine STM with TP release to the L forearm extensor bundle  for tissue extensibility and reduction of pain    PATIENT EDUCATION: Education details: Exercise technique Person educated: Patient Education method: Explanation Education comprehension: verbalized understanding  HOME EXERCISE PROGRAM: Access Code: [email or web address] URL: [email or web address] Date: 12/11/2023 Prepared by: Sidra Simpers  Exercises - Circular Shoulder Pendulum with Table Support  - 1 x daily - 7 x weekly - 3 sets - 10 reps - Flexion-Extension Shoulder Pendulum with Table Support  - 1 x daily - 7 x weekly - 3 sets - 10 reps - Horizontal Shoulder Pendulum with Table Support  - 1 x daily - 7 x weekly - 3 sets - 10 reps - Supine Scapular Retraction  - 1 x daily - 7 x weekly - 3 sets - 10 reps - Seated Shoulder Shrug Circles AROM Backward  - 1 x daily - 7 x weekly - 3 sets - 10 reps - Wrist AROM Flexion Extension  - 1 x daily - 7 x weekly - 3 sets - 10 reps - Elbow Flexion PROM  - 1 x daily - 7 x weekly - 3 sets - 10 reps - Elbow Extension PROM  - 1 x daily - 7 x weekly - 3 sets - 10 reps - Forearm Pronation PROM  - 1 x daily - 7 x weekly - 3 sets - 10 reps - Forearm Supination PROM  - 1 x daily - 7 x weekly - 3 sets - 10 reps   Access Code: [email or web address]  URL:  [email or web address]  Date: 10/05/2023  Prepared by: Marina  Moser  Exercises  - Standing shoulder flexion wall slides  - 1 x daily - 7 x weekly - 2 sets - 10 reps - 5 hold  - Standing Shoulder Abduction Slides at Wall  - 1 x daily - 7 x weekly - 2 sets - 10 reps - 5 hold  - Standing Isometric Shoulder Flexion with Doorway - Arm Bent  - 1 x daily - 7 x weekly - 2 sets - 10 reps - 5 hold  - Standing Isometric Shoulder Abduction with Doorway - Arm Bent  - 1 x daily - 7 x weekly - 2 sets - 10 reps - 5 hold  - Standing Isometric Shoulder Internal Rotation at Doorway  - 1 x daily - 7 x weekly - 2 sets - 10 reps - 5 hold  - Standing Isometric Shoulder Extension with Doorway - Arm Bent  - 1 x daily - 7 x weekly - 2 sets - 10 reps - 5 hold  - Seated Shoulder Pendulum Exercise  - 1 x daily - 7 x weekly - 2 sets - 10 reps - 5 hold   ASSESSMENT:  CLINICAL IMPRESSION:    Pt educated on importance of attendance agreement and consistently being late.  Pt advised that she could change her scheduled appointments to better fit her needs if needed.  Pt otherwise is doing well and making significant improvements towards ROM.  Pt is to be assessed by MD later today and will continue per their recommendations.   Pt will continue to benefit from skilled therapy to address remaining deficits in order to improve overall QoL and return to PLOF.        OBJECTIVE IMPAIRMENTS: Abnormal gait, decreased activity tolerance, decreased endurance, decreased mobility, difficulty walking, decreased ROM, decreased strength, hypomobility, increased edema, impaired perceived functional ability, impaired flexibility, impaired UE functional use, improper body mechanics, postural dysfunction, and pain.   ACTIVITY LIMITATIONS: carrying, lifting, bending, sitting, standing, squatting, stairs, transfers, bed  mobility, bathing, dressing, self feeding, reach over head, hygiene/grooming, locomotion level, and caring for  others  PARTICIPATION LIMITATIONS: meal prep, cleaning, laundry, driving, shopping, community activity, occupation, and yard work  PERSONAL FACTORS: Age, Fitness, Profession, and 3+ comorbidities: anemia, arthritis (back, L elbow, L hip, L ankle), asthma, DM, GERD, headache, hyperparathyroidism, lymphedema, seizure, sleep apnea are also affecting patient's functional outcome.   REHAB POTENTIAL: Good  CLINICAL DECISION MAKING: Evolving/moderate complexity  EVALUATION COMPLEXITY: Moderate   GOALS: Goals reviewed with patient? Yes  SHORT TERM GOALS: Target date: 10/24/2023  Patient will be independent in home exercise program to improve strength/mobility for better functional independence with ADLs. Baseline:See HEP above; 11/17/2023=Patient able to verbalize and today demonstrate good understanding of HEP especially now since she has received the results from MRI.  Goal status: MET   LONG TERM GOALS: Target date: 11/28/2023  Patient will decrease Quick DASH score by > 8 points demonstrating reduced self-reported upper extremity disability. Baseline: 68.2/100; 11/17/2023- will assess next session 11/30/23:  Goal status: INITIAL  2.  Patient will improve L shoulder AROM to > 140 degrees of flexion, scaption, and abduction for improved ability to perform overhead activities. Baseline: avoided AROM due to surgical procedure Goal status: INITIAL  3.  Patient will report a worst pain of 6/10 on VAS in L shoulder to improve tolerance with ADLs and reduced symptoms with activities. Baseline: 10/10 at worst Goal status: INITIAL  4.  Patient will be able to tolerate holding her young grandson without an increase in L shoulder pain to show improved L UE function and increase pt's ability to participate in family care. Baseline: Pt is unable to pick up grandson due to precautions of the L shoulder following RTC repair Goal status: ONGOING   PLAN:  PT FREQUENCY: 2x/week  PT DURATION: 8  weeks  PLANNED INTERVENTIONS: 97164- PT Re-evaluation, 97750- Physical Performance Testing, 97110-Therapeutic exercises, 97530- Therapeutic activity, 97112- Neuromuscular re-education, 97535- Self Care, [id number]- Manual therapy, [id number]- Gait training, [id number]- Orthotic Initial, [id number]- Orthotic/Prosthetic subsequent, [phone]- Canalith repositioning, [id number]- Splinting, [id number]- Electrical stimulation (manual), [id number]- Ultrasound, [id number]- Traction (mechanical), [id number]- Parrafin, [id number] (1-2 muscles), [id number] (3+ muscles)- Dry Needling, Patient/Family education, Balance training, Stair training, Taping, Joint mobilization, Spinal mobilization, Scar mobilization, Vestibular training, DME instructions, Cryotherapy, and Moist heat   PLAN FOR NEXT SESSION:    Continue PROM of the L shoulder for the next several weeks.   Fonda Simpers, PT, DPT Physical Therapist - [facility]  12/28/23, 9:10 AM

## 2024-01-01 ENCOUNTER — Ambulatory Visit: Payer: Worker's Compensation

## 2024-01-01 DIAGNOSIS — E119 Type 2 diabetes mellitus without complications: Secondary | ICD-10-CM | POA: Diagnosis not present

## 2024-01-01 DIAGNOSIS — H16223 Keratoconjunctivitis sicca, not specified as Sjogren's, bilateral: Secondary | ICD-10-CM | POA: Diagnosis not present

## 2024-01-01 DIAGNOSIS — H2513 Age-related nuclear cataract, bilateral: Secondary | ICD-10-CM | POA: Diagnosis not present

## 2024-01-02 ENCOUNTER — Ambulatory Visit

## 2024-01-02 ENCOUNTER — Other Ambulatory Visit: Payer: Self-pay

## 2024-01-02 DIAGNOSIS — M25612 Stiffness of left shoulder, not elsewhere classified: Secondary | ICD-10-CM

## 2024-01-02 DIAGNOSIS — M25512 Pain in left shoulder: Secondary | ICD-10-CM | POA: Diagnosis not present

## 2024-01-02 DIAGNOSIS — M25562 Pain in left knee: Secondary | ICD-10-CM

## 2024-01-02 DIAGNOSIS — M6281 Muscle weakness (generalized): Secondary | ICD-10-CM

## 2024-01-02 MED ORDER — OMEPRAZOLE 40 MG PO CPDR
40.0000 mg | DELAYED_RELEASE_CAPSULE | Freq: Every day | ORAL | 1 refills | Status: AC
  Filled 2024-01-02: qty 180, 90d supply, fill #0

## 2024-01-02 NOTE — Therapy (Signed)
 OUTPATIENT PHYSICAL THERAPY L SHOULDER TREATMENT     Patient Name: Ashley [name] MRN: [id number] DOB:[birth year], 58 y.o., female Today's Date: 01/02/2024  END OF SESSION:  PT End of Session - 01/02/24 0816     Visit Number 7    Number of Visits 24    Date for Recertification  03/04/24    PT Start Time 0815    PT Stop Time 0845    PT Time Calculation (min) 30 min    Activity Tolerance Patient tolerated treatment well;Patient limited by pain    Behavior During Therapy [facility] for tasks assessed/performed         Past Medical History:  Diagnosis Date   Allergy    Anemia    Arthritis    back, left elbow, left hip, left ankle    Asthma    Blood transfusion without reported diagnosis    Complication of anesthesia    delayed emergence   Diabetes mellitus without complication (HCC)    diet controlled   Diverticulosis    GERD (gastroesophageal reflux disease)    Headache    History of hiatal hernia    History of kidney stones    Hyperparathyroidism    Lymphedema    PONV (postoperative nausea and vomiting)    S/P subtotal parathyroidectomy    Seizure (HCC)    as a child   Sleep apnea    cannot tolerate mask   Thyroid  nodule 06/20/2018   Varicose vein of leg    Past Surgical History:  Procedure Laterality Date   ACETABULUM FRACTURE SURGERY Left    CESAREAN SECTION     CHOLECYSTECTOMY     COLONOSCOPY WITH PROPOFOL  N/A 05/31/2021   Procedure: COLONOSCOPY WITH PROPOFOL ;  Surgeon: Maryruth Ole DASEN, MD;  Location: [facility] ENDOSCOPY;  Service: Endoscopy;  Laterality: N/A;   ELBOW FRACTURE SURGERY  1995   ESOPHAGOGASTRODUODENOSCOPY (EGD) WITH PROPOFOL  N/A 05/31/2021   Procedure: ESOPHAGOGASTRODUODENOSCOPY (EGD) WITH PROPOFOL ;  Surgeon: Maryruth Ole DASEN, MD;  Location: [facility] ENDOSCOPY;  Service: Endoscopy;  Laterality: N/A;   FRACTURE SURGERY     left ankle complete repair, left elbow   HERNIA REPAIR     incisional   LAPAROSCOPIC GASTRIC SLEEVE RESECTION   2014   PARATHYROIDECTOMY N/A 10/25/2018   Procedure: PARATHYROIDECTOMY, DIABETIC, SLEEP APNEA;  Surgeon: Marolyn Nest, MD;  Location: [facility] ORS;  Service: General;  Laterality: N/A;   TONSILLECTOMY AND ADENOIDECTOMY N/A 12/08/2015   Procedure: TONSILLECTOMY AND ADENOIDECTOMY;  Surgeon: Chinita Hasten, MD;  Location: [facility] ORS;  Service: ENT;  Laterality: N/A;   TOTAL HIP ARTHROPLASTY Left 09/22/2022   Procedure: Left posterior total hip arthroplasty;  Surgeon: Lorelle Hussar, MD;  Location: [facility] ORS;  Service: Orthopedics;  Laterality: Left;   TUBAL LIGATION     Patient Active Problem List   Diagnosis Date Noted   Osteoarthritis of left hip 09/22/2022   Side pain 08/16/2022   Musculoskeletal pain 08/16/2022   Varicose veins of both legs with edema 01/05/2021   Swelling of limb 12/30/2020   Diabetes (HCC) 12/30/2020   Lymphedema 12/30/2020   S/P subtotal parathyroidectomy 11/08/2018   Hyperparathyroidism 06/20/2018   Thyroid  nodule 06/20/2018   Intractable pain 04/12/2015    PCP:  Ophelia Sage, MD  REFERRING PROVIDER: Norleen Gavel, MD  REFERRING DIAG:  [phone] (ICD-10-CM) - Left shoulder pain  M25.562 (ICD-10-CM) - Left knee pain    THERAPY DIAG:  Acute pain of left shoulder  Decreased ROM of left shoulder  Muscle weakness (generalized)  Acute  pain of left knee  Rationale for Evaluation and Treatment: Rehabilitation  ONSET DATE: 08/30/23  SUBJECTIVE:                                                                                                                                                                                      SUBJECTIVE STATEMENT:  Pt reports she had a visit with her MD and he stated she was doing well and to continue with the PROM for the next 2 weeks.  Pt will be seeing MD on 01/16/24.  Pt still notes that she is having pain in the knee, but the MD does not want to address it at this time and to focus on the shoulder.    Hand dominance:  Right  PERTINENT HISTORY: Pt was seen at [facility] ED on 08/30/23 after a fall sustained at work. Pt works in the emergency room and reported that she slipped on a wet spot and fell onto her left shoulder and knee to avoid falling onto her L hip. Per ED note: Physical exam is generally reassuring. Shoulder joint itself has normal mobility and I am not concerned about dislocation. No evidence of clavicular injury. Mild proximal humerus fracture is possible and the patient is concerned about the fact that she has had multiple prior orthopedic injuries/fractures due to relatively minor falls. We will proceed with x-rays and anticipate restricted movement with shoulder sling, RICE, etc. Pt underwent L THA last June and has previous history of elbow surgery that prevents full movement of L wrist. Pt reports that she wore the sling provided in ED for a couple of weeks after the fall but has not worn it since.  PMH includes anemia, arthritis (back, L elbow, L hip, L ankle), asthma, DM, GERD, headache, hyperparathyroidism, lymphedema, seizure, sleep apnea  PAIN: Reported on L shoulder: Are you having pain? Yes: NPRS scale: 4/10 currently, worst 10/10 with movement, best: 4/10  Pain location: anterior shoulder Pain description: Sharp when it hits. Aggravating factors: reaching overhead/behind head, movement Relieving factors: Voltaren, Biofreeze, lidocaine  patches, rest  PRECAUTIONS: None  RED FLAGS: None   WEIGHT BEARING RESTRICTIONS: No  FALLS:  Has patient fallen in last 6 months? Yes. Number of falls 1- pt reports that this incident is her only fall  LIVING ENVIRONMENT: Lives with: lives with their spouse Lives in: Mobile home Stairs: Yes: External: 4 steps; can reach both Has following equipment at home: Single point cane and Walker - 2 wheeled  OCCUPATION: Works in ED at [employer]- pt reports that the pain hasn't affected her job duties that much because she is R handed  PLOF:  Independent  PATIENT GOALS: Pt  wants to be able to hold her 42 month old grandson, get range of motion back to use her L arm normally.  NEXT MD VISIT: 10/12/23  OBJECTIVE:  Note: Objective measures were completed at Evaluation unless otherwise noted.  DIAGNOSTIC FINDINGS:  DG Humerus L (08/30/23): FINDINGS: There is no evidence of an acute fracture or dislocation. Radiopaque surgical screws and fixation wires are seen within the left olecranon process and left radial head. Soft tissues are unremarkable.   IMPRESSION: 1. No acute osseous abnormality. 2. Postoperative changes of the left elbow.   PATIENT SURVEYS:   QuickDASH Score: 68.2 / 100 = 68.2 %  COGNITION: Overall cognitive status: Within functional limits for tasks assessed     SENSATION: WFL- bilateral UE and LE  POSTURE: Pt holds L UE in guarded position at rest. L shoulder in sling upon arrival, but doffs with ease.   UPPER EXTREMITY ROM:   Active ROM Right eval Left PROM eval  Shoulder flexion 155 90  Shoulder extension    Shoulder abduction 120 90  Shoulder adduction    Shoulder internal rotation 75 NT  Shoulder external rotation 50 NT  Elbow flexion    Elbow extension    Wrist flexion    Wrist extension    Wrist ulnar deviation    Wrist radial deviation    Wrist pronation    Wrist supination    (Blank rows = not tested)   UPPER EXTREMITY MMT:  MMT Right eval Left eval  Shoulder flexion 4 NT  Shoulder extension    Shoulder abduction 3+ NT  Shoulder adduction    Shoulder internal rotation    Shoulder external rotation    Middle trapezius    Lower trapezius    Elbow flexion 4 NT  Elbow extension 4 NT  Wrist flexion    Wrist extension    Wrist ulnar deviation    Wrist radial deviation    Wrist pronation    Wrist supination    Grip strength (lbs)    (Blank rows = not tested)    PALPATION:  Pt with expected tenderness around the incision sites and still covered with the bandaging.                                                                                                                                TREATMENT DATE: 01/02/24   TherEx:  Seated scapular retractions, 1-2 sec holds, 2x10  Supine PROM of the L shoulder into flexion, abduction, IR, and ER within pain tolerable ranges.  Pt much more relaxed during this session and was able to achieve greater ROM by sight, but was not formally measured.  Pt with soft end feels throughout the process and was advised to let the therapist know when feeling discomfort.  Formal assessment of the R  Supine PROM of the L elbow into flexion, extension, supination/pronation to pain tolerable levels   Manual:   Seated STM with  TP release to the medial scapular region for pain modulation, along with infraspinatus as well  Supine STM with TP release to the L UT for tissue extensibility Supine STM to the L biceps and deltoid region for pain modulation and improved tissue extensibility Supine STM with TP release to the L forearm extensor bundle for tissue extensibility and reduction of pain    PATIENT EDUCATION: Education details: Exercise technique Person educated: Patient Education method: Explanation Education comprehension: verbalized understanding  HOME EXERCISE PROGRAM: Access Code: [email or web address] URL: [email or web address] Date: 12/11/2023 Prepared by: Sidra Simpers  Exercises - Circular Shoulder Pendulum with Table Support  - 1 x daily - 7 x weekly - 3 sets - 10 reps - Flexion-Extension Shoulder Pendulum with Table Support  - 1 x daily - 7 x weekly - 3 sets - 10 reps - Horizontal Shoulder Pendulum with Table Support  - 1 x daily - 7 x weekly - 3 sets - 10 reps - Supine Scapular Retraction  - 1 x daily - 7 x weekly - 3 sets - 10 reps - Seated Shoulder Shrug Circles AROM Backward  - 1 x daily - 7 x weekly - 3 sets - 10 reps - Wrist AROM Flexion Extension  - 1 x daily - 7 x weekly - 3 sets - 10 reps -  Elbow Flexion PROM  - 1 x daily - 7 x weekly - 3 sets - 10 reps - Elbow Extension PROM  - 1 x daily - 7 x weekly - 3 sets - 10 reps - Forearm Pronation PROM  - 1 x daily - 7 x weekly - 3 sets - 10 reps - Forearm Supination PROM  - 1 x daily - 7 x weekly - 3 sets - 10 reps   Access Code: [email or web address]  URL: [email or web address]  Date: 10/05/2023  Prepared by: Marina  Moser  Exercises  - Standing shoulder flexion wall slides  - 1 x daily - 7 x weekly - 2 sets - 10 reps - 5 hold  - Standing Shoulder Abduction Slides at Wall  - 1 x daily - 7 x weekly - 2 sets - 10 reps - 5 hold  - Standing Isometric Shoulder Flexion with Doorway - Arm Bent  - 1 x daily - 7 x weekly - 2 sets - 10 reps - 5 hold  - Standing Isometric Shoulder Abduction with Doorway - Arm Bent  - 1 x daily - 7 x weekly - 2 sets - 10 reps - 5 hold  - Standing Isometric Shoulder Internal Rotation at Doorway  - 1 x daily - 7 x weekly - 2 sets - 10 reps - 5 hold  - Standing Isometric Shoulder Extension with Doorway - Arm Bent  - 1 x daily - 7 x weekly - 2 sets - 10 reps - 5 hold  - Seated Shoulder Pendulum Exercise  - 1 x daily - 7 x weekly - 2 sets - 10 reps - 5 hold   ASSESSMENT:  CLINICAL IMPRESSION:    Pt continues to show up late to appointments, limiting the time that is able to be spent progressing overall ROM and mobility of the shoulder.  Pt educated on the importance of attending sessions and reminded that pt could change her appointment times to make it easier to make appointments on time.  Pt otherwise is doing well and has improved mobility of the shoulder.  Pt will begin AAROM in the next 2 weeks and continue to progress overall tolerance to  exercises.   Pt will continue to benefit from skilled therapy to address remaining deficits in order to improve overall QoL and return to PLOF.         OBJECTIVE IMPAIRMENTS: Abnormal gait, decreased activity tolerance, decreased endurance, decreased mobility, difficulty  walking, decreased ROM, decreased strength, hypomobility, increased edema, impaired perceived functional ability, impaired flexibility, impaired UE functional use, improper body mechanics, postural dysfunction, and pain.   ACTIVITY LIMITATIONS: carrying, lifting, bending, sitting, standing, squatting, stairs, transfers, bed mobility, bathing, dressing, self feeding, reach over head, hygiene/grooming, locomotion level, and caring for others  PARTICIPATION LIMITATIONS: meal prep, cleaning, laundry, driving, shopping, community activity, occupation, and yard work  PERSONAL FACTORS: Age, Fitness, Profession, and 3+ comorbidities: anemia, arthritis (back, L elbow, L hip, L ankle), asthma, DM, GERD, headache, hyperparathyroidism, lymphedema, seizure, sleep apnea are also affecting patient's functional outcome.   REHAB POTENTIAL: Good  CLINICAL DECISION MAKING: Evolving/moderate complexity  EVALUATION COMPLEXITY: Moderate   GOALS: Goals reviewed with patient? Yes  SHORT TERM GOALS: Target date: 10/24/2023  Patient will be independent in home exercise program to improve strength/mobility for better functional independence with ADLs. Baseline:See HEP above; 11/17/2023=Patient able to verbalize and today demonstrate good understanding of HEP especially now since she has received the results from MRI.  Goal status: MET   LONG TERM GOALS: Target date: 11/28/2023  Patient will decrease Quick DASH score by > 8 points demonstrating reduced self-reported upper extremity disability. Baseline: 68.2/100; 11/17/2023- will assess next session 11/30/23:  Goal status: INITIAL  2.  Patient will improve L shoulder AROM to > 140 degrees of flexion, scaption, and abduction for improved ability to perform overhead activities. Baseline: avoided AROM due to surgical procedure Goal status: INITIAL  3.  Patient will report a worst pain of 6/10 on VAS in L shoulder to improve tolerance with ADLs and reduced symptoms  with activities. Baseline: 10/10 at worst Goal status: INITIAL  4.  Patient will be able to tolerate holding her young grandson without an increase in L shoulder pain to show improved L UE function and increase pt's ability to participate in family care. Baseline: Pt is unable to pick up grandson due to precautions of the L shoulder following RTC repair Goal status: ONGOING   PLAN:  PT FREQUENCY: 2x/week  PT DURATION: 8 weeks  PLANNED INTERVENTIONS: 97164- PT Re-evaluation, 97750- Physical Performance Testing, 97110-Therapeutic exercises, 97530- Therapeutic activity, 97112- Neuromuscular re-education, 97535- Self Care, [id number]- Manual therapy, [id number]- Gait training, [id number]- Orthotic Initial, [id number]- Orthotic/Prosthetic subsequent, [phone]- Canalith repositioning, [id number]- Splinting, [id number]- Electrical stimulation (manual), [id number]- Ultrasound, [id number]- Traction (mechanical), [id number]- Parrafin, [id number] (1-2 muscles), [id number] (3+ muscles)- Dry Needling, Patient/Family education, Balance training, Stair training, Taping, Joint mobilization, Spinal mobilization, Scar mobilization, Vestibular training, DME instructions, Cryotherapy, and Moist heat   PLAN FOR NEXT SESSION:   Continue PROM of the L shoulder for the next 2 weeks, AAROM starting 01/10/24.   Fonda Simpers, PT, DPT Physical Therapist - [facility]  01/02/24, 10:01 AM

## 2024-01-03 ENCOUNTER — Ambulatory Visit: Payer: Worker's Compensation

## 2024-01-04 ENCOUNTER — Other Ambulatory Visit: Payer: Self-pay

## 2024-01-04 ENCOUNTER — Ambulatory Visit: Payer: Worker's Compensation

## 2024-01-04 DIAGNOSIS — M25612 Stiffness of left shoulder, not elsewhere classified: Secondary | ICD-10-CM

## 2024-01-04 DIAGNOSIS — M6281 Muscle weakness (generalized): Secondary | ICD-10-CM

## 2024-01-04 DIAGNOSIS — M25512 Pain in left shoulder: Secondary | ICD-10-CM

## 2024-01-04 DIAGNOSIS — M25562 Pain in left knee: Secondary | ICD-10-CM

## 2024-01-04 NOTE — Therapy (Signed)
 OUTPATIENT PHYSICAL THERAPY L SHOULDER TREATMENT     Patient Name: Ashley [name] MRN: [id number] DOB:12-10-1965, 58 y.o., female Today's Date: 01/04/2024  END OF SESSION:  PT End of Session - 01/04/24 0810     Visit Number 8    Number of Visits 24    Date for Recertification  03/04/24    PT Start Time 0807    PT Stop Time 0845    PT Time Calculation (min) 38 min    Activity Tolerance Patient tolerated treatment well;Patient limited by pain    Behavior During Therapy [facility] for tasks assessed/performed         Past Medical History:  Diagnosis Date   Allergy    Anemia    Arthritis    back, left elbow, left hip, left ankle    Asthma    Blood transfusion without reported diagnosis    Complication of anesthesia    delayed emergence   Diabetes mellitus without complication (HCC)    diet controlled   Diverticulosis    GERD (gastroesophageal reflux disease)    Headache    History of hiatal hernia    History of kidney stones    Hyperparathyroidism    Lymphedema    PONV (postoperative nausea and vomiting)    S/P subtotal parathyroidectomy    Seizure (HCC)    as a child   Sleep apnea    cannot tolerate mask   Thyroid  nodule 06/20/2018   Varicose vein of leg    Past Surgical History:  Procedure Laterality Date   ACETABULUM FRACTURE SURGERY Left    CESAREAN SECTION     CHOLECYSTECTOMY     COLONOSCOPY WITH PROPOFOL  N/A 05/31/2021   Procedure: COLONOSCOPY WITH PROPOFOL ;  Surgeon: Maryruth Ole DASEN, MD;  Location: [facility] ENDOSCOPY;  Service: Endoscopy;  Laterality: N/A;   ELBOW FRACTURE SURGERY  1995   ESOPHAGOGASTRODUODENOSCOPY (EGD) WITH PROPOFOL  N/A 05/31/2021   Procedure: ESOPHAGOGASTRODUODENOSCOPY (EGD) WITH PROPOFOL ;  Surgeon: Maryruth Ole DASEN, MD;  Location: [facility] ENDOSCOPY;  Service: Endoscopy;  Laterality: N/A;   FRACTURE SURGERY     left ankle complete repair, left elbow   HERNIA REPAIR     incisional   LAPAROSCOPIC GASTRIC SLEEVE RESECTION   2014   PARATHYROIDECTOMY N/A 10/25/2018   Procedure: PARATHYROIDECTOMY, DIABETIC, SLEEP APNEA;  Surgeon: Marolyn Nest, MD;  Location: [facility] ORS;  Service: General;  Laterality: N/A;   TONSILLECTOMY AND ADENOIDECTOMY N/A 12/08/2015   Procedure: TONSILLECTOMY AND ADENOIDECTOMY;  Surgeon: Chinita Hasten, MD;  Location: [facility] ORS;  Service: ENT;  Laterality: N/A;   TOTAL HIP ARTHROPLASTY Left 09/22/2022   Procedure: Left posterior total hip arthroplasty;  Surgeon: Lorelle Hussar, MD;  Location: [facility] ORS;  Service: Orthopedics;  Laterality: Left;   TUBAL LIGATION     Patient Active Problem List   Diagnosis Date Noted   Osteoarthritis of left hip 09/22/2022   Side pain 08/16/2022   Musculoskeletal pain 08/16/2022   Varicose veins of both legs with edema 01/05/2021   Swelling of limb 12/30/2020   Diabetes (HCC) 12/30/2020   Lymphedema 12/30/2020   S/P subtotal parathyroidectomy 11/08/2018   Hyperparathyroidism 06/20/2018   Thyroid  nodule 06/20/2018   Intractable pain 04/12/2015    PCP:  Ophelia Sage, MD  REFERRING PROVIDER: Norleen Gavel, MD  REFERRING DIAG:  [phone] (ICD-10-CM) - Left shoulder pain  M25.562 (ICD-10-CM) - Left knee pain    THERAPY DIAG:  Acute pain of left shoulder  Decreased ROM of left shoulder  Muscle weakness (generalized)  Acute  pain of left knee  Rationale for Evaluation and Treatment: Rehabilitation  ONSET DATE: 08/30/23  SUBJECTIVE:                                                                                                                                                                                      SUBJECTIVE STATEMENT:  Pt reports she feels better now than what she did after leaving the last session.  Pt reports she needed to ice the shoulder more the last session.    Hand dominance: Right  PERTINENT HISTORY: Pt was seen at [facility] ED on 08/30/23 after a fall sustained at work. Pt works in the emergency room and reported that she  slipped on a wet spot and fell onto her left shoulder and knee to avoid falling onto her L hip. Per ED note: Physical exam is generally reassuring. Shoulder joint itself has normal mobility and I am not concerned about dislocation. No evidence of clavicular injury. Mild proximal humerus fracture is possible and the patient is concerned about the fact that she has had multiple prior orthopedic injuries/fractures due to relatively minor falls. We will proceed with x-rays and anticipate restricted movement with shoulder sling, RICE, etc. Pt underwent L THA last June and has previous history of elbow surgery that prevents full movement of L wrist. Pt reports that she wore the sling provided in ED for a couple of weeks after the fall but has not worn it since.  PMH includes anemia, arthritis (back, L elbow, L hip, L ankle), asthma, DM, GERD, headache, hyperparathyroidism, lymphedema, seizure, sleep apnea  PAIN: Reported on L shoulder: Are you having pain? Yes: NPRS scale: 4/10 currently, worst 10/10 with movement, best: 4/10  Pain location: anterior shoulder Pain description: Sharp when it hits. Aggravating factors: reaching overhead/behind head, movement Relieving factors: Voltaren, Biofreeze, lidocaine  patches, rest  PRECAUTIONS: None  RED FLAGS: None   WEIGHT BEARING RESTRICTIONS: No  FALLS:  Has patient fallen in last 6 months? Yes. Number of falls 1- pt reports that this incident is her only fall  LIVING ENVIRONMENT: Lives with: lives with their spouse Lives in: Mobile home Stairs: Yes: External: 4 steps; can reach both Has following equipment at home: Single point cane and Walker - 2 wheeled  OCCUPATION: Works in ED at [employer]- pt reports that the pain hasn't affected her job duties that much because she is R handed  PLOF: Independent  PATIENT GOALS: Pt wants to be able to hold her 53 month old grandson, get range of motion back to use her L arm normally.  NEXT MD VISIT:  10/12/23  OBJECTIVE:  Note: Objective measures were completed at Evaluation  unless otherwise noted.  DIAGNOSTIC FINDINGS:  DG Humerus L (08/30/23): FINDINGS: There is no evidence of an acute fracture or dislocation. Radiopaque surgical screws and fixation wires are seen within the left olecranon process and left radial head. Soft tissues are unremarkable.   IMPRESSION: 1. No acute osseous abnormality. 2. Postoperative changes of the left elbow.   PATIENT SURVEYS:   QuickDASH Score: 68.2 / 100 = 68.2 %  COGNITION: Overall cognitive status: Within functional limits for tasks assessed     SENSATION: WFL- bilateral UE and LE  POSTURE: Pt holds L UE in guarded position at rest. L shoulder in sling upon arrival, but doffs with ease.   UPPER EXTREMITY ROM:   Active ROM Right eval Left PROM eval  Shoulder flexion 155 90  Shoulder extension    Shoulder abduction 120 90  Shoulder adduction    Shoulder internal rotation 75 NT  Shoulder external rotation 50 NT  Elbow flexion    Elbow extension    Wrist flexion    Wrist extension    Wrist ulnar deviation    Wrist radial deviation    Wrist pronation    Wrist supination    (Blank rows = not tested)   UPPER EXTREMITY MMT:  MMT Right eval Left eval  Shoulder flexion 4 NT  Shoulder extension    Shoulder abduction 3+ NT  Shoulder adduction    Shoulder internal rotation    Shoulder external rotation    Middle trapezius    Lower trapezius    Elbow flexion 4 NT  Elbow extension 4 NT  Wrist flexion    Wrist extension    Wrist ulnar deviation    Wrist radial deviation    Wrist pronation    Wrist supination    Grip strength (lbs)    (Blank rows = not tested)    PALPATION:  Pt with expected tenderness around the incision sites and still covered with the bandaging.                                                                                                                               TREATMENT DATE:  01/04/24   TherEx:  Seated scapular retractions, 1-2 sec holds, 2x10  Standing pendulums into flexion/extension, 30 sec bouts x2 Standing pendulums into horizontal abduction/adduction, 30 sec bouts x2 Standing pendulums into circular movement CW/CCW, 30 sec bouts x2 each direction  Supine PROM of the L shoulder into flexion, abduction, IR, and ER within pain tolerable ranges.  Pt much more relaxed during this session and was able to achieve greater ROM by sight, but was not formally measured.  Pt with soft end feels throughout the process and was advised to let the therapist know when feeling discomfort.  Formal assessment of the R   Manual:   Supine STM with TP release to the L UT for tissue extensibility Supine STM to the L biceps and deltoid region for pain modulation and  improved tissue extensibility Supine STM with TP release to the L forearm extensor bundle for tissue extensibility and reduction of pain    PATIENT EDUCATION: Education details: Exercise technique Person educated: Patient Education method: Explanation Education comprehension: verbalized understanding  HOME EXERCISE PROGRAM: Access Code: [email or web address] URL: [email or web address] Date: 12/11/2023 Prepared by: Sidra Simpers  Exercises - Circular Shoulder Pendulum with Table Support  - 1 x daily - 7 x weekly - 3 sets - 10 reps - Flexion-Extension Shoulder Pendulum with Table Support  - 1 x daily - 7 x weekly - 3 sets - 10 reps - Horizontal Shoulder Pendulum with Table Support  - 1 x daily - 7 x weekly - 3 sets - 10 reps - Supine Scapular Retraction  - 1 x daily - 7 x weekly - 3 sets - 10 reps - Seated Shoulder Shrug Circles AROM Backward  - 1 x daily - 7 x weekly - 3 sets - 10 reps - Wrist AROM Flexion Extension  - 1 x daily - 7 x weekly - 3 sets - 10 reps - Elbow Flexion PROM  - 1 x daily - 7 x weekly - 3 sets - 10 reps - Elbow Extension PROM  - 1 x daily - 7 x weekly - 3 sets - 10 reps - Forearm  Pronation PROM  - 1 x daily - 7 x weekly - 3 sets - 10 reps - Forearm Supination PROM  - 1 x daily - 7 x weekly - 3 sets - 10 reps   Access Code: [email or web address]  URL: [email or web address]  Date: 10/05/2023  Prepared by: Marina  Moser  Exercises  - Standing shoulder flexion wall slides  - 1 x daily - 7 x weekly - 2 sets - 10 reps - 5 hold  - Standing Shoulder Abduction Slides at Wall  - 1 x daily - 7 x weekly - 2 sets - 10 reps - 5 hold  - Standing Isometric Shoulder Flexion with Doorway - Arm Bent  - 1 x daily - 7 x weekly - 2 sets - 10 reps - 5 hold  - Standing Isometric Shoulder Abduction with Doorway - Arm Bent  - 1 x daily - 7 x weekly - 2 sets - 10 reps - 5 hold  - Standing Isometric Shoulder Internal Rotation at Doorway  - 1 x daily - 7 x weekly - 2 sets - 10 reps - 5 hold  - Standing Isometric Shoulder Extension with Doorway - Arm Bent  - 1 x daily - 7 x weekly - 2 sets - 10 reps - 5 hold  - Seated Shoulder Pendulum Exercise  - 1 x daily - 7 x weekly - 2 sets - 10 reps - 5 hold   ASSESSMENT:  CLINICAL IMPRESSION:    Pt continues to respond well to the exercises and was able to demonstrate proper form of the pendulum exercises without the need for excessive cuing or demonstration by therapist.  Pt will likely be introduced to AAROM exercises at the next visit and will continue to be introduced to more challenging exercises as per protocol.   Pt will continue to benefit from skilled therapy to address remaining deficits in order to improve overall QoL and return to PLOF.     OBJECTIVE IMPAIRMENTS: Abnormal gait, decreased activity tolerance, decreased endurance, decreased mobility, difficulty walking, decreased ROM, decreased strength, hypomobility, increased edema, impaired perceived functional ability, impaired flexibility, impaired UE functional use, improper body mechanics, postural dysfunction, and pain.   ACTIVITY  LIMITATIONS: carrying, lifting, bending, sitting,  standing, squatting, stairs, transfers, bed mobility, bathing, dressing, self feeding, reach over head, hygiene/grooming, locomotion level, and caring for others  PARTICIPATION LIMITATIONS: meal prep, cleaning, laundry, driving, shopping, community activity, occupation, and yard work  PERSONAL FACTORS: Age, Fitness, Profession, and 3+ comorbidities: anemia, arthritis (back, L elbow, L hip, L ankle), asthma, DM, GERD, headache, hyperparathyroidism, lymphedema, seizure, sleep apnea are also affecting patient's functional outcome.   REHAB POTENTIAL: Good  CLINICAL DECISION MAKING: Evolving/moderate complexity  EVALUATION COMPLEXITY: Moderate   GOALS: Goals reviewed with patient? Yes  SHORT TERM GOALS: Target date: 10/24/2023  Patient will be independent in home exercise program to improve strength/mobility for better functional independence with ADLs. Baseline:See HEP above; 11/17/2023=Patient able to verbalize and today demonstrate good understanding of HEP especially now since she has received the results from MRI.  Goal status: MET   LONG TERM GOALS: Target date: 11/28/2023  Patient will decrease Quick DASH score by > 8 points demonstrating reduced self-reported upper extremity disability. Baseline: 68.2/100; 11/17/2023- will assess next session 11/30/23:  Goal status: INITIAL  2.  Patient will improve L shoulder AROM to > 140 degrees of flexion, scaption, and abduction for improved ability to perform overhead activities. Baseline: avoided AROM due to surgical procedure Goal status: INITIAL  3.  Patient will report a worst pain of 6/10 on VAS in L shoulder to improve tolerance with ADLs and reduced symptoms with activities. Baseline: 10/10 at worst Goal status: INITIAL  4.  Patient will be able to tolerate holding her young grandson without an increase in L shoulder pain to show improved L UE function and increase pt's ability to participate in family care. Baseline: Pt is unable to  pick up grandson due to precautions of the L shoulder following RTC repair Goal status: ONGOING   PLAN:  PT FREQUENCY: 2x/week  PT DURATION: 8 weeks  PLANNED INTERVENTIONS: 97164- PT Re-evaluation, 97750- Physical Performance Testing, 97110-Therapeutic exercises, 97530- Therapeutic activity, 97112- Neuromuscular re-education, 97535- Self Care, [id number]- Manual therapy, [id number]- Gait training, [id number]- Orthotic Initial, [id number]- Orthotic/Prosthetic subsequent, [phone]- Canalith repositioning, [id number]- Splinting, [id number]- Electrical stimulation (manual), [id number]- Ultrasound, [id number]- Traction (mechanical), [id number]- Parrafin, [id number] (1-2 muscles), 20561 (3+ muscles)- Dry Needling, Patient/Family education, Balance training, Stair training, Taping, Joint mobilization, Spinal mobilization, Scar mobilization, Vestibular training, DME instructions, Cryotherapy, and Moist heat   PLAN FOR NEXT SESSION:   Continue PROM of the L shoulder for the next 2 weeks, AAROM starting week of 01/10/24.   Fonda Simpers, PT, DPT Physical Therapist - [facility]  01/04/24, 8:10 AM

## 2024-01-08 ENCOUNTER — Ambulatory Visit: Payer: Worker's Compensation

## 2024-01-08 DIAGNOSIS — M25562 Pain in left knee: Secondary | ICD-10-CM

## 2024-01-08 DIAGNOSIS — M25512 Pain in left shoulder: Secondary | ICD-10-CM | POA: Diagnosis not present

## 2024-01-08 DIAGNOSIS — M25612 Stiffness of left shoulder, not elsewhere classified: Secondary | ICD-10-CM

## 2024-01-08 DIAGNOSIS — M6281 Muscle weakness (generalized): Secondary | ICD-10-CM

## 2024-01-08 NOTE — Therapy (Signed)
 OUTPATIENT PHYSICAL THERAPY L SHOULDER TREATMENT     Patient Name: Ashley [name] MRN: [id number] DOB:03-Mar-1966, 58 y.o., female Today's Date: 01/08/2024  END OF SESSION:  PT End of Session - 01/08/24 0809     Visit Number 9    Number of Visits 24    Date for Recertification  03/04/24    PT Start Time 0807    PT Stop Time 0845    PT Time Calculation (min) 38 min    Activity Tolerance Patient tolerated treatment well;Patient limited by pain    Behavior During Therapy [facility] for tasks assessed/performed          Past Medical History:  Diagnosis Date   Allergy    Anemia    Arthritis    back, left elbow, left hip, left ankle    Asthma    Blood transfusion without reported diagnosis    Complication of anesthesia    delayed emergence   Diabetes mellitus without complication (HCC)    diet controlled   Diverticulosis    GERD (gastroesophageal reflux disease)    Headache    History of hiatal hernia    History of kidney stones    Hyperparathyroidism    Lymphedema    PONV (postoperative nausea and vomiting)    S/P subtotal parathyroidectomy    Seizure (HCC)    as a child   Sleep apnea    cannot tolerate mask   Thyroid  nodule 06/20/2018   Varicose vein of leg    Past Surgical History:  Procedure Laterality Date   ACETABULUM FRACTURE SURGERY Left    CESAREAN SECTION     CHOLECYSTECTOMY     COLONOSCOPY WITH PROPOFOL  N/A 05/31/2021   Procedure: COLONOSCOPY WITH PROPOFOL ;  Surgeon: Maryruth Ole DASEN, MD;  Location: [facility] ENDOS[facility];  Service: Endoscopy;  Laterality: N/A;   ELBOW FRACTURE SURGERY  1995   ESOPHAGOGASTRODUODENOSCOPY (EGD) WITH PROPOFOL  N/A 05/31/2021   Procedure: ESOPHAGOGASTRODUODENOSCOPY (EGD) WITH PROPOFOL ;  Surgeon: Maryruth Ole DASEN, MD;  Location: [facility] ENDOSCOPY;  Service: Endoscopy;  Laterality: N/A;   FRACTURE SURGERY     left ankle complete repair, left elbow   HERNIA REPAIR     incisional   LAPAROSCOPIC GASTRIC SLEEVE RESECTION   2014   PARATHYROIDECTOMY N/A 10/25/2018   Procedure: PARATHYROIDECTOMY, DIABETIC, SLEEP APNEA;  Surgeon: Marolyn Nest, MD;  Location: [facility] ORS;  Service: General;  Laterality: N/A;   TONSILLECTOMY AND ADENOIDECTOMY N/A 12/08/2015   Procedure: TONSILLECTOMY AND ADENOIDECTOMY;  Surgeon: Chinita Hasten, MD;  Location: [facility] ORS;  Service: ENT;  Laterality: N/A;   TOTAL HIP ARTHROPLASTY Left 09/22/2022   Procedure: Left posterior total hip arthroplasty;  Surgeon: Lorelle Hussar, MD;  Location: [facility] ORS;  Service: Orthopedics;  Laterality: Left;   TUBAL LIGATION     Patient Active Problem List   Diagnosis Date Noted   Osteoarthritis of left hip 09/22/2022   Side pain 08/16/2022   Musculoskeletal pain 08/16/2022   Varicose veins of both legs with edema 01/05/2021   Swelling of limb 12/30/2020   Diabetes (HCC) 12/30/2020   Lymphedema 12/30/2020   S/P subtotal parathyroidectomy 11/08/2018   Hyperparathyroidism 06/20/2018   Thyroid  nodule 06/20/2018   Intractable pain 04/12/2015    PCP:  Ophelia Sage, MD  REFERRING PROVIDER: Norleen Gavel, MD  REFERRING DIAG:  [phone] (ICD-10-CM) - Left shoulder pain  M25.562 (ICD-10-CM) - Left knee pain    THERAPY DIAG:  Acute pain of left shoulder  Decreased ROM of left shoulder  Muscle weakness (generalized)  Acute pain of left knee  Rationale for Evaluation and Treatment: Rehabilitation  ONSET DATE: 08/30/23  SUBJECTIVE:                                                                                                                                                                                      SUBJECTIVE STATEMENT:  Pt reports that she had an uneventful weekend.  Pt notes 2-3/10 pain upon arrival.    Hand dominance: Right  PERTINENT HISTORY: Pt was seen at [facility] ED on 08/30/23 after a fall sustained at work. Pt works in the emergency room and reported that she slipped on a wet spot and fell onto her left shoulder and knee to avoid  falling onto her L hip. Per ED note: Physical exam is generally reassuring. Shoulder joint itself has normal mobility and I am not concerned about dislocation. No evidence of clavicular injury. Mild proximal humerus fracture is possible and the patient is concerned about the fact that she has had multiple prior orthopedic injuries/fractures due to relatively minor falls. We will proceed with x-rays and anticipate restricted movement with shoulder sling, RICE, etc. Pt underwent L THA last June and has previous history of elbow surgery that prevents full movement of L wrist. Pt reports that she wore the sling provided in ED for a couple of weeks after the fall but has not worn it since.  PMH includes anemia, arthritis (back, L elbow, L hip, L ankle), asthma, DM, GERD, headache, hyperparathyroidism, lymphedema, seizure, sleep apnea  PAIN: Reported on L shoulder: Are you having pain? Yes: NPRS scale: 4/10 currently, worst 10/10 with movement, best: 4/10  Pain location: anterior shoulder Pain description: Sharp when it hits. Aggravating factors: reaching overhead/behind head, movement Relieving factors: Voltaren, Biofreeze, lidocaine  patches, rest  PRECAUTIONS: None  RED FLAGS: None   WEIGHT BEARING RESTRICTIONS: No  FALLS:  Has patient fallen in last 6 months? Yes. Number of falls 1- pt reports that this incident is her only fall  LIVING ENVIRONMENT: Lives with: lives with their spouse Lives in: Mobile home Stairs: Yes: External: 4 steps; can reach both Has following equipment at home: Single point cane and Walker - 2 wheeled  OCCUPATION: Works in ED at [employer]- pt reports that the pain hasn't affected her job duties that much because she is R handed  PLOF: Independent  PATIENT GOALS: Pt wants to be able to hold her 25 month old grandson, get range of motion back to use her L arm normally.  NEXT MD VISIT: 10/12/23  OBJECTIVE:  Note: Objective measures were completed at Evaluation unless  otherwise noted.  DIAGNOSTIC FINDINGS:  DG Humerus L (08/30/23): FINDINGS:  There is no evidence of an acute fracture or dislocation. Radiopaque surgical screws and fixation wires are seen within the left olecranon process and left radial head. Soft tissues are unremarkable.   IMPRESSION: 1. No acute osseous abnormality. 2. Postoperative changes of the left elbow.   PATIENT SURVEYS:   QuickDASH Score: 68.2 / 100 = 68.2 %  COGNITION: Overall cognitive status: Within functional limits for tasks assessed     SENSATION: WFL- bilateral UE and LE  POSTURE: Pt holds L UE in guarded position at rest. L shoulder in sling upon arrival, but doffs with ease.   UPPER EXTREMITY ROM:   Active ROM Right eval Left PROM eval  Shoulder flexion 155 90  Shoulder extension    Shoulder abduction 120 90  Shoulder adduction    Shoulder internal rotation 75 NT  Shoulder external rotation 50 NT  Elbow flexion    Elbow extension    Wrist flexion    Wrist extension    Wrist ulnar deviation    Wrist radial deviation    Wrist pronation    Wrist supination    (Blank rows = not tested)   UPPER EXTREMITY MMT:  MMT Right eval Left eval  Shoulder flexion 4 NT  Shoulder extension    Shoulder abduction 3+ NT  Shoulder adduction    Shoulder internal rotation    Shoulder external rotation    Middle trapezius    Lower trapezius    Elbow flexion 4 NT  Elbow extension 4 NT  Wrist flexion    Wrist extension    Wrist ulnar deviation    Wrist radial deviation    Wrist pronation    Wrist supination    Grip strength (lbs)    (Blank rows = not tested)    PALPATION:  Pt with expected tenderness around the incision sites and still covered with the bandaging.                                                                                                                               TREATMENT DATE: 01/08/24  TherEx:  Seated scapular retractions, 1-2 sec holds, 2x10  Supine AAROM chest  press with PVC pipe, 2x15  Verbal cues for the R UE doing majority of the work  Supine AAROM shoulder flexion with PVC pipe, 2x10  Verbal cuing for thigh to ~90 deg of forward flexion before pain sets in  Supine AAROM shoulder flexion/extension with PVC pipe, 2x10  Supine PROM of the L shoulder into flexion, abduction, IR, and ER within pain tolerable ranges.  Pt much more relaxed during this session and was able to achieve greater ROM by sight, but was not formally measured.  Pt with soft end feels throughout the process and was advised to let the therapist know when feeling discomfort.  Formal assessment of the R    PATIENT EDUCATION: Education details: Exercise technique Person educated: Patient Education method: Explanation Education comprehension: verbalized understanding  HOME EXERCISE PROGRAM: Access Code: [email or web address] URL: [email or web address] Date: 12/11/2023 Prepared by: Sidra Simpers  Exercises - Circular Shoulder Pendulum with Table Support  - 1 x daily - 7 x weekly - 3 sets - 10 reps - Flexion-Extension Shoulder Pendulum with Table Support  - 1 x daily - 7 x weekly - 3 sets - 10 reps - Horizontal Shoulder Pendulum with Table Support  - 1 x daily - 7 x weekly - 3 sets - 10 reps - Supine Scapular Retraction  - 1 x daily - 7 x weekly - 3 sets - 10 reps - Seated Shoulder Shrug Circles AROM Backward  - 1 x daily - 7 x weekly - 3 sets - 10 reps - Wrist AROM Flexion Extension  - 1 x daily - 7 x weekly - 3 sets - 10 reps - Elbow Flexion PROM  - 1 x daily - 7 x weekly - 3 sets - 10 reps - Elbow Extension PROM  - 1 x daily - 7 x weekly - 3 sets - 10 reps - Forearm Pronation PROM  - 1 x daily - 7 x weekly - 3 sets - 10 reps - Forearm Supination PROM  - 1 x daily - 7 x weekly - 3 sets - 10 reps   Access Code: [email or web address]  URL: [email or web address]  Date: 10/05/2023  Prepared by: Marina  Moser  Exercises  - Standing shoulder flexion wall slides  - 1 x  daily - 7 x weekly - 2 sets - 10 reps - 5 hold  - Standing Shoulder Abduction Slides at Wall  - 1 x daily - 7 x weekly - 2 sets - 10 reps - 5 hold  - Standing Isometric Shoulder Flexion with Doorway - Arm Bent  - 1 x daily - 7 x weekly - 2 sets - 10 reps - 5 hold  - Standing Isometric Shoulder Abduction with Doorway - Arm Bent  - 1 x daily - 7 x weekly - 2 sets - 10 reps - 5 hold  - Standing Isometric Shoulder Internal Rotation at Doorway  - 1 x daily - 7 x weekly - 2 sets - 10 reps - 5 hold  - Standing Isometric Shoulder Extension with Doorway - Arm Bent  - 1 x daily - 7 x weekly - 2 sets - 10 reps - 5 hold  - Seated Shoulder Pendulum Exercise  - 1 x daily - 7 x weekly - 2 sets - 10 reps - 5 hold   ASSESSMENT:  CLINICAL IMPRESSION:    Pt was introduced to the active assisted exercises and performed well with the tasks.  Pt noted to have improved mechanics and needed minimal cuing for proper technique.  Pt continues to improve in overall ROM and will continue to improve tolerance to the exercises and improved ROM.   Pt will continue to benefit from skilled therapy to address remaining deficits in order to improve overall QoL and return to PLOF.      OBJECTIVE IMPAIRMENTS: Abnormal gait, decreased activity tolerance, decreased endurance, decreased mobility, difficulty walking, decreased ROM, decreased strength, hypomobility, increased edema, impaired perceived functional ability, impaired flexibility, impaired UE functional use, improper body mechanics, postural dysfunction, and pain.   ACTIVITY LIMITATIONS: carrying, lifting, bending, sitting, standing, squatting, stairs, transfers, bed mobility, bathing, dressing, self feeding, reach over head, hygiene/grooming, locomotion level, and caring for others  PARTICIPATION LIMITATIONS: meal prep, cleaning, laundry, driving, shopping, community activity, occupation, and yard work  PERSONAL FACTORS: Age, Fitness, Profession, and  3+ comorbidities:  anemia, arthritis (back, L elbow, L hip, L ankle), asthma, DM, GERD, headache, hyperparathyroidism, lymphedema, seizure, sleep apnea are also affecting patient's functional outcome.   REHAB POTENTIAL: Good  CLINICAL DECISION MAKING: Evolving/moderate complexity  EVALUATION COMPLEXITY: Moderate   GOALS: Goals reviewed with patient? Yes  SHORT TERM GOALS: Target date: 10/24/2023  Patient will be independent in home exercise program to improve strength/mobility for better functional independence with ADLs. Baseline:See HEP above; 11/17/2023=Patient able to verbalize and today demonstrate good understanding of HEP especially now since she has received the results from MRI.  Goal status: MET   LONG TERM GOALS: Target date: 11/28/2023  Patient will decrease Quick DASH score by > 8 points demonstrating reduced self-reported upper extremity disability. Baseline: 68.2/100;  Goal status: INITIAL  2.  Patient will improve L shoulder AROM to > 140 degrees of flexion, scaption, and abduction for improved ability to perform overhead activities. Baseline: avoided AROM due to surgical procedure Goal status: INITIAL  3.  Patient will report a worst pain of 6/10 on VAS in L shoulder to improve tolerance with ADLs and reduced symptoms with activities. Baseline: 10/10 at worst Goal status: INITIAL  4.  Patient will be able to tolerate holding her young grandson without an increase in L shoulder pain to show improved L UE function and increase pt's ability to participate in family care. Baseline: Pt is unable to pick up grandson due to precautions of the L shoulder following RTC repair Goal status: ONGOING   PLAN:  PT FREQUENCY: 2x/week  PT DURATION: 8 weeks  PLANNED INTERVENTIONS: 97164- PT Re-evaluation, 97750- Physical Performance Testing, 97110-Therapeutic exercises, 97530- Therapeutic activity, [id number]- Neuromuscular re-education, 97535- Self Care, 02859- Manual therapy, [id number]- Gait training,  [id number]- Orthotic Initial, [id number]- Orthotic/Prosthetic subsequent, [phone]- Canalith repositioning, [id number]- Splinting, [id number]- Electrical stimulation (manual), [id number]- Ultrasound, [id number]- Traction (mechanical), [id number]- Parrafin, 20560 (1-2 muscles), 20561 (3+ muscles)- Dry Needling, Patient/Family education, Balance training, Stair training, Taping, Joint mobilization, Spinal mobilization, Scar mobilization, Vestibular training, DME instructions, Cryotherapy, and Moist heat   PLAN FOR NEXT SESSION:   Continue improving tolerance to AAROM and introduce pulley's and maybe ranger.   Fonda Simpers, PT, DPT Physical Therapist - [facility]  01/08/24, 9:47 AM

## 2024-01-10 ENCOUNTER — Ambulatory Visit: Payer: Worker's Compensation

## 2024-01-10 DIAGNOSIS — M6281 Muscle weakness (generalized): Secondary | ICD-10-CM

## 2024-01-10 DIAGNOSIS — M25612 Stiffness of left shoulder, not elsewhere classified: Secondary | ICD-10-CM

## 2024-01-10 DIAGNOSIS — M25512 Pain in left shoulder: Secondary | ICD-10-CM

## 2024-01-10 NOTE — Therapy (Addendum)
 OUTPATIENT PHYSICAL THERAPY L SHOULDER TREATMENT     Patient Name: Ashley [name] MRN: [id number] DOB:[birth year], 58 y.o., female Today's Date: 01/10/2024  END OF SESSION:  PT End of Session - 01/10/24 0820     Visit Number 10    Number of Visits 24    Date for Recertification  03/04/24    PT Start Time 0820    PT Stop Time 0845    PT Time Calculation (min) 25 min    Activity Tolerance Patient tolerated treatment well;Patient limited by pain    Behavior During Therapy [facility] for tasks assessed/performed          Past Medical History:  Diagnosis Date   Allergy    Anemia    Arthritis    back, left elbow, left hip, left ankle    Asthma    Blood transfusion without reported diagnosis    Complication of anesthesia    delayed emergence   Diabetes mellitus without complication (HCC)    diet controlled   Diverticulosis    GERD (gastroesophageal reflux disease)    Headache    History of hiatal hernia    History of kidney stones    Hyperparathyroidism    Lymphedema    PONV (postoperative nausea and vomiting)    S/P subtotal parathyroidectomy    Seizure (HCC)    as a child   Sleep apnea    cannot tolerate mask   Thyroid  nodule 06/20/2018   Varicose vein of leg    Past Surgical History:  Procedure Laterality Date   ACETABULUM FRACTURE SURGERY Left    CESAREAN SECTION     CHOLECYSTECTOMY     COLONOSCOPY WITH PROPOFOL  N/A 05/31/2021   Procedure: COLONOSCOPY WITH PROPOFOL ;  Surgeon: Maryruth Ole DASEN, MD;  Location: [facility];  Service: Endoscopy;  Laterality: N/A;   ELBOW FRACTURE SURGERY  1995   ESOPHAGOGASTRODUODENOSCOPY (EGD) WITH PROPOFOL  N/A 05/31/2021   Procedure: ESOPHAGOGASTRODUODENOSCOPY (EGD) WITH PROPOFOL ;  Surgeon: Maryruth Ole DASEN, MD;  Location: [facility] ENDOS[facility];  Service: Endoscopy;  Laterality: N/A;   FRACTURE SURGERY     left ankle complete repair, left elbow   HERNIA REPAIR     incisional   LAPAROSCOPIC GASTRIC SLEEVE RESECTION   2014   PARATHYROIDECTOMY N/A 10/25/2018   Procedure: PARATHYROIDECTOMY, DIABETIC, SLEEP APNEA;  Surgeon: Marolyn Nest, MD;  Location: [facility] ORS;  Service: General;  Laterality: N/A;   TONSILLECTOMY AND ADENOIDECTOMY N/A 12/08/2015   Procedure: TONSILLECTOMY AND ADENOIDECTOMY;  Surgeon: Chinita Hasten, MD;  Location: [facility] ORS;  Service: ENT;  Laterality: N/A;   TOTAL HIP ARTHROPLASTY Left 09/22/2022   Procedure: Left posterior total hip arthroplasty;  Surgeon: Lorelle Hussar, MD;  Location: [facility] ORS;  Service: Orthopedics;  Laterality: Left;   TUBAL LIGATION     Patient Active Problem List   Diagnosis Date Noted   Osteoarthritis of left hip 09/22/2022   Side pain 08/16/2022   Musculoskeletal pain 08/16/2022   Varicose veins of both legs with edema 01/05/2021   Swelling of limb 12/30/2020   Diabetes (HCC) 12/30/2020   Lymphedema 12/30/2020   S/P subtotal parathyroidectomy 11/08/2018   Hyperparathyroidism 06/20/2018   Thyroid  nodule 06/20/2018   Intractable pain 04/12/2015    PCP:  Ophelia Sage, MD  REFERRING PROVIDER: Norleen Gavel, MD  REFERRING DIAG:  [phone] (ICD-10-CM) - Left shoulder pain  M25.562 (ICD-10-CM) - Left knee pain    THERAPY DIAG:  Acute pain of left shoulder  Decreased ROM of left shoulder  Muscle weakness (generalized)  Rationale for Evaluation and Treatment: Rehabilitation  ONSET DATE: 08/30/23  SUBJECTIVE:                                                                                                                                                                                      SUBJECTIVE STATEMENT:  Pt reports having some soreness after the last visit and has not since done the AAROM exercises as part of HEP.      Hand dominance: Right  PERTINENT HISTORY: Pt was seen at [facility] ED on 08/30/23 after a fall sustained at work. Pt works in the emergency room and reported that she slipped on a wet spot and fell onto her left shoulder and knee to  avoid falling onto her L hip. Per ED note: Physical exam is generally reassuring. Shoulder joint itself has normal mobility and I am not concerned about dislocation. No evidence of clavicular injury. Mild proximal humerus fracture is possible and the patient is concerned about the fact that she has had multiple prior orthopedic injuries/fractures due to relatively minor falls. We will proceed with x-rays and anticipate restricted movement with shoulder sling, RICE, etc. Pt underwent L THA last June and has previous history of elbow surgery that prevents full movement of L wrist. Pt reports that she wore the sling provided in ED for a couple of weeks after the fall but has not worn it since.  PMH includes anemia, arthritis (back, L elbow, L hip, L ankle), asthma, DM, GERD, headache, hyperparathyroidism, lymphedema, seizure, sleep apnea  PAIN: Reported on L shoulder: Are you having pain? Yes: NPRS scale: 4/10 currently, worst 10/10 with movement, best: 4/10  Pain location: anterior shoulder Pain description: Sharp when it hits. Aggravating factors: reaching overhead/behind head, movement Relieving factors: Voltaren, Biofreeze, lidocaine  patches, rest  PRECAUTIONS: None  RED FLAGS: None   WEIGHT BEARING RESTRICTIONS: No  FALLS:  Has patient fallen in last 6 months? Yes. Number of falls 1- pt reports that this incident is her only fall  LIVING ENVIRONMENT: Lives with: lives with their spouse Lives in: Mobile home Stairs: Yes: External: 4 steps; can reach both Has following equipment at home: Single point cane and Walker - 2 wheeled  OCCUPATION: Works in ED at [employer]- pt reports that the pain hasn't affected her job duties that much because she is R handed  PLOF: Independent  PATIENT GOALS: Pt wants to be able to hold her 51 month old grandson, get range of motion back to use her L arm normally.  NEXT MD VISIT: 10/12/23  OBJECTIVE:  Note: Objective measures were completed at Evaluation  unless otherwise noted.  DIAGNOSTIC FINDINGS:  DG Humerus L (  08/30/23): FINDINGS: There is no evidence of an acute fracture or dislocation. Radiopaque surgical screws and fixation wires are seen within the left olecranon process and left radial head. Soft tissues are unremarkable.   IMPRESSION: 1. No acute osseous abnormality. 2. Postoperative changes of the left elbow.   PATIENT SURVEYS:   QuickDASH Score: 68.2 / 100 = 68.2 %  COGNITION: Overall cognitive status: Within functional limits for tasks assessed     SENSATION: WFL- bilateral UE and LE  POSTURE: Pt holds L UE in guarded position at rest. L shoulder in sling upon arrival, but doffs with ease.   UPPER EXTREMITY ROM:   Active ROM Right eval Left PROM eval  Shoulder flexion 155 90  Shoulder extension    Shoulder abduction 120 90  Shoulder adduction    Shoulder internal rotation 75 NT  Shoulder external rotation 50 NT  Elbow flexion    Elbow extension    Wrist flexion    Wrist extension    Wrist ulnar deviation    Wrist radial deviation    Wrist pronation    Wrist supination    (Blank rows = not tested)   UPPER EXTREMITY MMT:  MMT Right eval Left eval  Shoulder flexion 4 NT  Shoulder extension    Shoulder abduction 3+ NT  Shoulder adduction    Shoulder internal rotation    Shoulder external rotation    Middle trapezius    Lower trapezius    Elbow flexion 4 NT  Elbow extension 4 NT  Wrist flexion    Wrist extension    Wrist ulnar deviation    Wrist radial deviation    Wrist pronation    Wrist supination    Grip strength (lbs)    (Blank rows = not tested)    PALPATION:  Pt with expected tenderness around the incision sites and still covered with the bandaging.                                                                                                                               TREATMENT DATE: 01/10/24  TherEx:  Seated pulleys into L shoulder flexion, 3x10 Seated pulleys  into L shoulder abduction, 3x10  Seated scapular retractions, 1-2 sec holds, 2x10   Supine AAROM chest press with PVC pipe, 2x15  Verbal cues for the R UE doing majority of the work  Supine AAROM shoulder flexion with PVC pipe, 2x10  Verbal cuing for thigh to ~90 deg of forward flexion before pain sets in  Supine AAROM shoulder flexion/extension with PVC pipe, 2x10     PATIENT EDUCATION: Education details: Exercise technique Person educated: Patient Education method: Explanation Education comprehension: verbalized understanding  HOME EXERCISE PROGRAM: Access Code: [email or web address] URL: [email or web address] Date: 12/11/2023 Prepared by: Sidra Simpers  Exercises - Circular Shoulder Pendulum with Table Support  - 1 x daily - 7 x weekly - 3 sets - 10 reps - Flexion-Extension Shoulder Pendulum with Table Support  -  1 x daily - 7 x weekly - 3 sets - 10 reps - Horizontal Shoulder Pendulum with Table Support  - 1 x daily - 7 x weekly - 3 sets - 10 reps - Supine Scapular Retraction  - 1 x daily - 7 x weekly - 3 sets - 10 reps - Seated Shoulder Shrug Circles AROM Backward  - 1 x daily - 7 x weekly - 3 sets - 10 reps - Wrist AROM Flexion Extension  - 1 x daily - 7 x weekly - 3 sets - 10 reps - Elbow Flexion PROM  - 1 x daily - 7 x weekly - 3 sets - 10 reps - Elbow Extension PROM  - 1 x daily - 7 x weekly - 3 sets - 10 reps - Forearm Pronation PROM  - 1 x daily - 7 x weekly - 3 sets - 10 reps - Forearm Supination PROM  - 1 x daily - 7 x weekly - 3 sets - 10 reps   Access Code: [email or web address]  URL: [email or web address]  Date: 10/05/2023  Prepared by: Marina  Moser  Exercises  - Standing shoulder flexion wall slides  - 1 x daily - 7 x weekly - 2 sets - 10 reps - 5 hold  - Standing Shoulder Abduction Slides at Wall  - 1 x daily - 7 x weekly - 2 sets - 10 reps - 5 hold  - Standing Isometric Shoulder Flexion with Doorway - Arm Bent  - 1 x daily - 7 x weekly - 2 sets - 10  reps - 5 hold  - Standing Isometric Shoulder Abduction with Doorway - Arm Bent  - 1 x daily - 7 x weekly - 2 sets - 10 reps - 5 hold  - Standing Isometric Shoulder Internal Rotation at Doorway  - 1 x daily - 7 x weekly - 2 sets - 10 reps - 5 hold  - Standing Isometric Shoulder Extension with Doorway - Arm Bent  - 1 x daily - 7 x weekly - 2 sets - 10 reps - 5 hold  - Seated Shoulder Pendulum Exercise  - 1 x daily - 7 x weekly - 2 sets - 10 reps - 5 hold   ASSESSMENT:  CLINICAL IMPRESSION:    Session limited by pt's late arrival.  Pt responded well to the exercises and the newly introduced pulley system.  Pt able to achieve good ROM without any increase in pain.  Pt reports that she has a pulley system at home and was advised to perform the exercise at home if possible.  Pt has upcoming visit with MD on Tuesday and therapist advised pt to inform them of current AAROM and to clarify when she could participate in AROM.  Will remind her of this at the next appointment.   Pt will continue to benefit from skilled therapy to address remaining deficits in order to improve overall QoL and return to PLOF.       OBJECTIVE IMPAIRMENTS: Abnormal gait, decreased activity tolerance, decreased endurance, decreased mobility, difficulty walking, decreased ROM, decreased strength, hypomobility, increased edema, impaired perceived functional ability, impaired flexibility, impaired UE functional use, improper body mechanics, postural dysfunction, and pain.   ACTIVITY LIMITATIONS: carrying, lifting, bending, sitting, standing, squatting, stairs, transfers, bed mobility, bathing, dressing, self feeding, reach over head, hygiene/grooming, locomotion level, and caring for others  PARTICIPATION LIMITATIONS: meal prep, cleaning, laundry, driving, shopping, community activity, occupation, and yard work  PERSONAL FACTORS: Age, Fitness, Profession, and 3+ comorbidities: anemia,  arthritis (back, L elbow, L hip, L ankle),  asthma, DM, GERD, headache, hyperparathyroidism, lymphedema, seizure, sleep apnea are also affecting patient's functional outcome.   REHAB POTENTIAL: Good  CLINICAL DECISION MAKING: Evolving/moderate complexity  EVALUATION COMPLEXITY: Moderate   GOALS: Goals reviewed with patient? Yes  SHORT TERM GOALS: Target date: 10/24/2023  Patient will be independent in home exercise program to improve strength/mobility for better functional independence with ADLs. Baseline:See HEP above; 11/17/2023=Patient able to verbalize and today demonstrate good understanding of HEP especially now since she has received the results from MRI.  Goal status: MET   LONG TERM GOALS: Target date: 11/28/2023  Patient will decrease Quick DASH score by > 8 points demonstrating reduced self-reported upper extremity disability. Baseline: 68.2/100 01/10/24: 81.8% Goal status: INITIAL  2.  Patient will improve L shoulder AROM to > 140 degrees of flexion, scaption, and abduction for improved ability to perform overhead activities. Baseline: avoided AROM due to surgical procedure 01/10/24: avoided AROM due to surgical procedure Goal status: INITIAL  3.  Patient will report a worst pain of 6/10 on VAS in L shoulder to improve tolerance with ADLs and reduced symptoms with activities. Baseline: 10/10 at worst 01/10/24: 3/10 at worst Goal status: MET  4.  Patient will be able to tolerate holding her young grandson without an increase in L shoulder pain to show improved L UE function and increase pt's ability to participate in family care. Baseline: Pt is unable to pick up grandson due to precautions of the L shoulder following RTC repair 01/10/24: Unable to do so at this time due to lifting restrictions and AROM restrictions Goal status: ONGOING   PLAN:  PT FREQUENCY: 2x/week  PT DURATION: 8 weeks  PLANNED INTERVENTIONS: 97164- PT Re-evaluation, 97750- Physical Performance Testing, 97110-Therapeutic exercises,  97530- Therapeutic activity, 97112- Neuromuscular re-education, 97535- Self Care, [id number]- Manual therapy, [id number]- Gait training, [id number]- Orthotic Initial, [id number]- Orthotic/Prosthetic subsequent, [id number]- Canalith repositioning, [id number]- Splinting, [id number]- Electrical stimulation (manual), [id number]- Ultrasound, [id number]- Traction (mechanical), [id number]- Parrafin, 20560 (1-2 muscles), 20561 (3+ muscles)- Dry Needling, Patient/Family education, Balance training, Stair training, Taping, Joint mobilization, Spinal mobilization, Scar mobilization, Vestibular training, DME instructions, Cryotherapy, and Moist heat   PLAN FOR NEXT SESSION:   Continue improving tolerance to AAROM and introduce pulley's and maybe ranger.   Fonda Simpers, PT, DPT Physical Therapist - Operating Room Services  01/10/24, 9:11 AM

## 2024-01-15 ENCOUNTER — Ambulatory Visit: Payer: Worker's Compensation

## 2024-01-15 DIAGNOSIS — M25512 Pain in left shoulder: Secondary | ICD-10-CM | POA: Diagnosis not present

## 2024-01-15 DIAGNOSIS — M25612 Stiffness of left shoulder, not elsewhere classified: Secondary | ICD-10-CM

## 2024-01-15 DIAGNOSIS — M6281 Muscle weakness (generalized): Secondary | ICD-10-CM

## 2024-01-15 DIAGNOSIS — M25562 Pain in left knee: Secondary | ICD-10-CM

## 2024-01-15 NOTE — Therapy (Signed)
 OUTPATIENT PHYSICAL THERAPY L SHOULDER TREATMENT     Patient Name: Ashley [name] MRN: [id number] DOB:[birth year], 58 y.o., female Today's Date: 01/15/2024  END OF SESSION:  PT End of Session - 01/15/24 0811     Visit Number 11    Number of Visits 24    Date for Recertification  03/04/24    PT Start Time 0807    PT Stop Time 0845    PT Time Calculation (min) 38 min    Activity Tolerance Patient tolerated treatment well;Patient limited by pain    Behavior During Therapy [facility] for tasks assessed/performed           Past Medical History:  Diagnosis Date   Allergy    Anemia    Arthritis    back, left elbow, left hip, left ankle    Asthma    Blood transfusion without reported diagnosis    Complication of anesthesia    delayed emergence   Diabetes mellitus without complication (HCC)    diet controlled   Diverticulosis    GERD (gastroesophageal reflux disease)    Headache    History of hiatal hernia    History of kidney stones    Hyperparathyroidism    Lymphedema    PONV (postoperative nausea and vomiting)    S/P subtotal parathyroidectomy    Seizure (HCC)    as a child   Sleep apnea    cannot tolerate mask   Thyroid  nodule 06/20/2018   Varicose vein of leg    Past Surgical History:  Procedure Laterality Date   ACETABULUM FRACTURE SURGERY Left    CESAREAN SECTION     CHOLECYSTECTOMY     COLONOSCOPY WITH PROPOFOL  N/A 05/31/2021   Procedure: COLONOSCOPY WITH PROPOFOL ;  Surgeon: Maryruth Ole DASEN, MD;  Location: [facility];  Service: Endoscopy;  Laterality: N/A;   ELBOW FRACTURE SURGERY  1995   ESOPHAGOGASTRODUODENOSCOPY (EGD) WITH PROPOFOL  N/A 05/31/2021   Procedure: ESOPHAGOGASTRODUODENOSCOPY (EGD) WITH PROPOFOL ;  Surgeon: Maryruth Ole DASEN, MD;  Location: [facility] ENDOSCOPY;  Service: Endoscopy;  Laterality: N/A;   FRACTURE SURGERY     left ankle complete repair, left elbow   HERNIA REPAIR     incisional   LAPAROSCOPIC GASTRIC SLEEVE  RESECTION  2014   PARATHYROIDECTOMY N/A 10/25/2018   Procedure: PARATHYROIDECTOMY, DIABETIC, SLEEP APNEA;  Surgeon: Marolyn Nest, MD;  Location: [facility] ORS;  Service: General;  Laterality: N/A;   TONSILLECTOMY AND ADENOIDECTOMY N/A 12/08/2015   Procedure: TONSILLECTOMY AND ADENOIDECTOMY;  Surgeon: Chinita Hasten, MD;  Location: [facility] ORS;  Service: ENT;  Laterality: N/A;   TOTAL HIP ARTHROPLASTY Left 09/22/2022   Procedure: Left posterior total hip arthroplasty;  Surgeon: Lorelle Hussar, MD;  Location: [facility] ORS;  Service: Orthopedics;  Laterality: Left;   TUBAL LIGATION     Patient Active Problem List   Diagnosis Date Noted   Osteoarthritis of left hip 09/22/2022   Side pain 08/16/2022   Musculoskeletal pain 08/16/2022   Varicose veins of both legs with edema 01/05/2021   Swelling of limb 12/30/2020   Diabetes (HCC) 12/30/2020   Lymphedema 12/30/2020   S/P subtotal parathyroidectomy 11/08/2018   Hyperparathyroidism 06/20/2018   Thyroid  nodule 06/20/2018   Intractable pain 04/12/2015    PCP:  Ophelia Sage, MD  REFERRING PROVIDER: Norleen Gavel, MD  REFERRING DIAG:  [id number] (ICD-10-CM) - Left shoulder pain  M25.562 (ICD-10-CM) - Left knee pain    THERAPY DIAG:  Acute pain of left shoulder  Decreased ROM of left shoulder  Muscle weakness (generalized)  Acute pain of left knee  Rationale for Evaluation and Treatment: Rehabilitation  ONSET DATE: 08/30/23  SUBJECTIVE:                                                                                                                                                                                      SUBJECTIVE STATEMENT:  Pt reports she got her times mixed up this morning, showing up at 8:04 instead of 7:15.  Pt is to see MD tomorrow.      Hand dominance: Right  PERTINENT HISTORY: Pt was seen at [facility] ED on 08/30/23 after a fall sustained at work. Pt works in the emergency room and reported that she slipped on a wet spot and  fell onto her left shoulder and knee to avoid falling onto her L hip. Per ED note: Physical exam is generally reassuring. Shoulder joint itself has normal mobility and I am not concerned about dislocation. No evidence of clavicular injury. Mild proximal humerus fracture is possible and the patient is concerned about the fact that she has had multiple prior orthopedic injuries/fractures due to relatively minor falls. We will proceed with x-rays and anticipate restricted movement with shoulder sling, RICE, etc. Pt underwent L THA last June and has previous history of elbow surgery that prevents full movement of L wrist. Pt reports that she wore the sling provided in ED for a couple of weeks after the fall but has not worn it since.  PMH includes anemia, arthritis (back, L elbow, L hip, L ankle), asthma, DM, GERD, headache, hyperparathyroidism, lymphedema, seizure, sleep apnea  PAIN: Reported on L shoulder: Are you having pain? Yes: NPRS scale: 4/10 currently, worst 10/10 with movement, best: 4/10  Pain location: anterior shoulder Pain description: Sharp when it hits. Aggravating factors: reaching overhead/behind head, movement Relieving factors: Voltaren, Biofreeze, lidocaine  patches, rest  PRECAUTIONS: None  RED FLAGS: None   WEIGHT BEARING RESTRICTIONS: No  FALLS:  Has patient fallen in last 6 months? Yes. Number of falls 1- pt reports that this incident is her only fall  LIVING ENVIRONMENT: Lives with: lives with their spouse Lives in: Mobile home Stairs: Yes: External: 4 steps; can reach both Has following equipment at home: Single point cane and Walker - 2 wheeled  OCCUPATION: Works in ED at [employer]- pt reports that the pain hasn't affected her job duties that much because she is R handed  PLOF: Independent  PATIENT GOALS: Pt wants to be able to hold her 37 month old grandson, get range of motion back to use her L arm normally.  NEXT MD VISIT: 10/12/23  OBJECTIVE:  Note:  Objective measures were completed at Evaluation unless  otherwise noted.  DIAGNOSTIC FINDINGS:  DG Humerus L (08/30/23): FINDINGS: There is no evidence of an acute fracture or dislocation. Radiopaque surgical screws and fixation wires are seen within the left olecranon process and left radial head. Soft tissues are unremarkable.   IMPRESSION: 1. No acute osseous abnormality. 2. Postoperative changes of the left elbow.   PATIENT SURVEYS:   QuickDASH Score: 68.2 / 100 = 68.2 %  COGNITION: Overall cognitive status: Within functional limits for tasks assessed     SENSATION: WFL- bilateral UE and LE  POSTURE: Pt holds L UE in guarded position at rest. L shoulder in sling upon arrival, but doffs with ease.   UPPER EXTREMITY ROM:   Active ROM Right eval Left PROM eval  Shoulder flexion 155 90  Shoulder extension    Shoulder abduction 120 90  Shoulder adduction    Shoulder internal rotation 75 NT  Shoulder external rotation 50 NT  Elbow flexion    Elbow extension    Wrist flexion    Wrist extension    Wrist ulnar deviation    Wrist radial deviation    Wrist pronation    Wrist supination    (Blank rows = not tested)   UPPER EXTREMITY MMT:  MMT Right eval Left eval  Shoulder flexion 4 NT  Shoulder extension    Shoulder abduction 3+ NT  Shoulder adduction    Shoulder internal rotation    Shoulder external rotation    Middle trapezius    Lower trapezius    Elbow flexion 4 NT  Elbow extension 4 NT  Wrist flexion    Wrist extension    Wrist ulnar deviation    Wrist radial deviation    Wrist pronation    Wrist supination    Grip strength (lbs)    (Blank rows = not tested)    PALPATION:  Pt with expected tenderness around the incision sites and still covered with the bandaging.                                                                                                                               TREATMENT DATE: 01/15/24    TherEx:  Seated  pulleys into L shoulder flexion, 2 min Seated pulleys into L shoulder abduction, 2 min  Standing AAROM L shoulder flexion with use of the UE Ranger, 2x10  Standing AAROM of L shoulder into circular patterns (CW/CCW like washing a table) with use of UE Ranger on floor, 2x10 each direction Standing  Seated scapular retractions, 1-2 sec holds, 2x10   Supine AAROM chest press with PVC pipe, 2x15  Verbal cues for the R UE doing majority of the work  Supine AAROM shoulder flexion with PVC pipe, 2x10  Pt able to achieve 142 deg of shoulder flexion   Supine AAROM shoulder ER with PVC pipe, 2x10   Manual:  Supine STM with TP release to the L UT for tissue extensibility Supine STM to  the L biceps and deltoid region for pain modulation and improved tissue extensibility      PATIENT EDUCATION: Education details: Exercise technique Person educated: Patient Education method: Explanation Education comprehension: verbalized understanding  HOME EXERCISE PROGRAM: Access Code: [email or web address] URL: [email or web address] Date: 12/11/2023 Prepared by: Sidra Simpers  Exercises - Circular Shoulder Pendulum with Table Support  - 1 x daily - 7 x weekly - 3 sets - 10 reps - Flexion-Extension Shoulder Pendulum with Table Support  - 1 x daily - 7 x weekly - 3 sets - 10 reps - Horizontal Shoulder Pendulum with Table Support  - 1 x daily - 7 x weekly - 3 sets - 10 reps - Supine Scapular Retraction  - 1 x daily - 7 x weekly - 3 sets - 10 reps - Seated Shoulder Shrug Circles AROM Backward  - 1 x daily - 7 x weekly - 3 sets - 10 reps - Wrist AROM Flexion Extension  - 1 x daily - 7 x weekly - 3 sets - 10 reps - Elbow Flexion PROM  - 1 x daily - 7 x weekly - 3 sets - 10 reps - Elbow Extension PROM  - 1 x daily - 7 x weekly - 3 sets - 10 reps - Forearm Pronation PROM  - 1 x daily - 7 x weekly - 3 sets - 10 reps - Forearm Supination PROM  - 1 x daily - 7 x weekly - 3 sets - 10 reps   Access  Code: [email or web address]  URL: [email or web address]  Date: 10/05/2023  Prepared by: Marina  Moser  Exercises  - Standing shoulder flexion wall slides  - 1 x daily - 7 x weekly - 2 sets - 10 reps - 5 hold  - Standing Shoulder Abduction Slides at Wall  - 1 x daily - 7 x weekly - 2 sets - 10 reps - 5 hold  - Standing Isometric Shoulder Flexion with Doorway - Arm Bent  - 1 x daily - 7 x weekly - 2 sets - 10 reps - 5 hold  - Standing Isometric Shoulder Abduction with Doorway - Arm Bent  - 1 x daily - 7 x weekly - 2 sets - 10 reps - 5 hold  - Standing Isometric Shoulder Internal Rotation at Doorway  - 1 x daily - 7 x weekly - 2 sets - 10 reps - 5 hold  - Standing Isometric Shoulder Extension with Doorway - Arm Bent  - 1 x daily - 7 x weekly - 2 sets - 10 reps - 5 hold  - Seated Shoulder Pendulum Exercise  - 1 x daily - 7 x weekly - 2 sets - 10 reps - 5 hold   ASSESSMENT:  CLINICAL IMPRESSION:    Pt responded well to the new exercises and was able to tolerate the ranger exercises without any complications.  Pt is making steady improvements with the exercises and is able to engage the musculature without pain.  Pt was advised to consult with MD on current protocol and to address when cleared for active ROM.  Pt otherwise is doing well and making good progress towards goals.   Pt will continue to benefit from skilled therapy to address remaining deficits in order to improve overall QoL and return to PLOF.        OBJECTIVE IMPAIRMENTS: Abnormal gait, decreased activity tolerance, decreased endurance, decreased mobility, difficulty walking, decreased ROM, decreased strength, hypomobility, increased edema, impaired perceived functional ability, impaired flexibility, impaired UE functional use, improper  body mechanics, postural dysfunction, and pain.   ACTIVITY LIMITATIONS: carrying, lifting, bending, sitting, standing, squatting, stairs, transfers, bed mobility, bathing, dressing, self feeding,  reach over head, hygiene/grooming, locomotion level, and caring for others  PARTICIPATION LIMITATIONS: meal prep, cleaning, laundry, driving, shopping, community activity, occupation, and yard work  PERSONAL FACTORS: Age, Fitness, Profession, and 3+ comorbidities: anemia, arthritis (back, L elbow, L hip, L ankle), asthma, DM, GERD, headache, hyperparathyroidism, lymphedema, seizure, sleep apnea are also affecting patient's functional outcome.   REHAB POTENTIAL: Good  CLINICAL DECISION MAKING: Evolving/moderate complexity  EVALUATION COMPLEXITY: Moderate   GOALS: Goals reviewed with patient? Yes  SHORT TERM GOALS: Target date: 10/24/2023  Patient will be independent in home exercise program to improve strength/mobility for better functional independence with ADLs. Baseline:See HEP above; 11/17/2023=Patient able to verbalize and today demonstrate good understanding of HEP especially now since she has received the results from MRI.  Goal status: MET   LONG TERM GOALS: Target date: 11/28/2023  Patient will decrease Quick DASH score by > 8 points demonstrating reduced self-reported upper extremity disability. Baseline: 68.2/100 01/10/24:  Goal status: INITIAL  2.  Patient will improve L shoulder AROM to > 140 degrees of flexion, scaption, and abduction for improved ability to perform overhead activities. Baseline: avoided AROM due to surgical procedure 01/10/24: avoided AROM due to surgical procedure Goal status: INITIAL  3.  Patient will report a worst pain of 6/10 on VAS in L shoulder to improve tolerance with ADLs and reduced symptoms with activities. Baseline: 10/10 at worst 01/10/24: 3/10 at worst Goal status: MET  4.  Patient will be able to tolerate holding her young grandson without an increase in L shoulder pain to show improved L UE function and increase pt's ability to participate in family care. Baseline: Pt is unable to pick up grandson due to precautions of the L  shoulder following RTC repair 01/10/24: Unable to do so at this time due to lifting restrictions and AROM restrictions Goal status: ONGOING   PLAN:  PT FREQUENCY: 2x/week  PT DURATION: 8 weeks  PLANNED INTERVENTIONS: 97164- PT Re-evaluation, 97750- Physical Performance Testing, 97110-Therapeutic exercises, 97530- Therapeutic activity, 97112- Neuromuscular re-education, 97535- Self Care, 02859- Manual therapy, [id number]- Gait training, [id number]- Orthotic Initial, [id number]- Orthotic/Prosthetic subsequent, [id number]- Canalith repositioning, [id number]- Splinting, [id number]- Electrical stimulation (manual), [id number]- Ultrasound, [id number]- Traction (mechanical), [id number]- Parrafin, 20560 (1-2 muscles), 20561 (3+ muscles)- Dry Needling, Patient/Family education, Balance training, Stair training, Taping, Joint mobilization, Spinal mobilization, Scar mobilization, Vestibular training, DME instructions, Cryotherapy, and Moist heat   PLAN FOR NEXT SESSION:   Check on visit count and approval for more visits.  Check on protocol and performing AROM in the next few weeks. Continue improving tolerance to AAROM and introduce pulley's and maybe ranger.   Fonda Simpers, PT, DPT Physical Therapist - [facility]  01/15/24, 8:50 AM

## 2024-01-15 NOTE — Therapy (Incomplete)
 OUTPATIENT PHYSICAL THERAPY L SHOULDER TREATMENT     Patient Name: Ashley [name] MRN: [id number] DOB:11-20-1965, 58 y.o., female Today's Date: 01/15/2024  END OF SESSION:    Past Medical History:  Diagnosis Date   Allergy    Anemia    Arthritis    back, left elbow, left hip, left ankle    Asthma    Blood transfusion without reported diagnosis    Complication of anesthesia    delayed emergence   Diabetes mellitus without complication (HCC)    diet controlled   Diverticulosis    GERD (gastroesophageal reflux disease)    Headache    History of hiatal hernia    History of kidney stones    Hyperparathyroidism    Lymphedema    PONV (postoperative nausea and vomiting)    S/P subtotal parathyroidectomy    Seizure (HCC)    as a child   Sleep apnea    cannot tolerate mask   Thyroid  nodule 06/20/2018   Varicose vein of leg    Past Surgical History:  Procedure Laterality Date   ACETABULUM FRACTURE SURGERY Left    CESAREAN SECTION     CHOLECYSTECTOMY     COLONOSCOPY WITH PROPOFOL  N/A 05/31/2021   Procedure: COLONOSCOPY WITH PROPOFOL ;  Surgeon: Maryruth Ole DASEN, MD;  Location: [facility];  Service: Endoscopy;  Laterality: N/A;   ELBOW FRACTURE SURGERY  1995   ESOPHAGOGASTRODUODENOSCOPY (EGD) WITH PROPOFOL  N/A 05/31/2021   Procedure: ESOPHAGOGASTRODUODENOSCOPY (EGD) WITH PROPOFOL ;  Surgeon: Maryruth Ole DASEN, MD;  Location: [facility];  Service: Endoscopy;  Laterality: N/A;   FRACTURE SURGERY     left ankle complete repair, left elbow   HERNIA REPAIR     incisional   LAPAROSCOPIC GASTRIC SLEEVE RESECTION  2014   PARATHYROIDECTOMY N/A 10/25/2018   Procedure: PARATHYROIDECTOMY, DIABETIC, SLEEP APNEA;  Surgeon: Marolyn Nest, MD;  Location: [facility];  Service: General;  Laterality: N/A;   TONSILLECTOMY AND ADENOIDECTOMY N/A 12/08/2015   Procedure: TONSILLECTOMY AND ADENOIDECTOMY;  Surgeon: Chinita Hasten, MD;  Location: [facility];  Service: ENT;   Laterality: N/A;   TOTAL HIP ARTHROPLASTY Left 09/22/2022   Procedure: Left posterior total hip arthroplasty;  Surgeon: Lorelle Hussar, MD;  Location: [facility];  Service: Orthopedics;  Laterality: Left;   TUBAL LIGATION     Patient Active Problem List   Diagnosis Date Noted   Osteoarthritis of left hip 09/22/2022   Side pain 08/16/2022   Musculoskeletal pain 08/16/2022   Varicose veins of both legs with edema 01/05/2021   Swelling of limb 12/30/2020   Diabetes (HCC) 12/30/2020   Lymphedema 12/30/2020   S/P subtotal parathyroidectomy 11/08/2018   Hyperparathyroidism 06/20/2018   Thyroid  nodule 06/20/2018   Intractable pain 04/12/2015    PCP:  Ophelia Sage, MD  REFERRING PROVIDER: Norleen Gavel, MD  REFERRING DIAG:  [phone] (ICD-10-CM) - Left shoulder pain  M25.562 (ICD-10-CM) - Left knee pain    THERAPY DIAG:  No diagnosis found.  Rationale for Evaluation and Treatment: Rehabilitation  ONSET DATE: 08/30/23  SUBJECTIVE:  SUBJECTIVE STATEMENT:  ***    Hand dominance: Right  PERTINENT HISTORY: Pt was seen at [facility] ED on 08/30/23 after a fall sustained at work. Pt works in the emergency room and reported that she slipped on a wet spot and fell onto her left shoulder and knee to avoid falling onto her L hip. Per ED note: Physical exam is generally reassuring. Shoulder joint itself has normal mobility and I am not concerned about dislocation. No evidence of clavicular injury. Mild proximal humerus fracture is possible and the patient is concerned about the fact that she has had multiple prior orthopedic injuries/fractures due to relatively minor falls. We will proceed with x-rays and anticipate restricted movement with shoulder sling, RICE, etc. Pt underwent L THA last June and has previous history of  elbow surgery that prevents full movement of L wrist. Pt reports that she wore the sling provided in ED for a couple of weeks after the fall but has not worn it since.  PMH includes anemia, arthritis (back, L elbow, L hip, L ankle), asthma, DM, GERD, headache, hyperparathyroidism, lymphedema, seizure, sleep apnea  PAIN: Reported on L shoulder: Are you having pain? Yes: NPRS scale: 4/10 currently, worst 10/10 with movement, best: 4/10  Pain location: anterior shoulder Pain description: Sharp when it hits. Aggravating factors: reaching overhead/behind head, movement Relieving factors: Voltaren, Biofreeze, lidocaine  patches, rest  PRECAUTIONS: None  RED FLAGS: None   WEIGHT BEARING RESTRICTIONS: No  FALLS:  Has patient fallen in last 6 months? Yes. Number of falls 1- pt reports that this incident is her only fall  LIVING ENVIRONMENT: Lives with: lives with their spouse Lives in: Mobile home Stairs: Yes: External: 4 steps; can reach both Has following equipment at home: Single point cane and Walker - 2 wheeled  OCCUPATION: Works in ED at [employer]- pt reports that the pain hasn't affected her job duties that much because she is R handed  PLOF: Independent  PATIENT GOALS: Pt wants to be able to hold her 60 month old grandson, get range of motion back to use her L arm normally.  NEXT MD VISIT: 10/12/23  OBJECTIVE:  Note: Objective measures were completed at Evaluation unless otherwise noted.  DIAGNOSTIC FINDINGS:  DG Humerus L (08/30/23): FINDINGS: There is no evidence of an acute fracture or dislocation. Radiopaque surgical screws and fixation wires are seen within the left olecranon process and left radial head. Soft tissues are unremarkable.   IMPRESSION: 1. No acute osseous abnormality. 2. Postoperative changes of the left elbow.   PATIENT SURVEYS:   QuickDASH Score: 68.2 / 100 = 68.2 %  COGNITION: Overall cognitive status: Within functional limits for tasks  assessed     SENSATION: WFL- bilateral UE and LE  POSTURE: Pt holds L UE in guarded position at rest. L shoulder in sling upon arrival, but doffs with ease.   UPPER EXTREMITY ROM:   Active ROM Right eval Left PROM eval  Shoulder flexion 155 90  Shoulder extension    Shoulder abduction 120 90  Shoulder adduction    Shoulder internal rotation 75 NT  Shoulder external rotation 50 NT  Elbow flexion    Elbow extension    Wrist flexion    Wrist extension    Wrist ulnar deviation    Wrist radial deviation    Wrist pronation    Wrist supination    (Blank rows = not tested)   UPPER EXTREMITY MMT:  MMT Right eval Left eval  Shoulder flexion 4 NT  Shoulder extension    Shoulder abduction 3+ NT  Shoulder adduction    Shoulder internal rotation    Shoulder external rotation    Middle trapezius    Lower trapezius    Elbow flexion 4 NT  Elbow extension 4 NT  Wrist flexion    Wrist extension    Wrist ulnar deviation    Wrist radial deviation    Wrist pronation    Wrist supination    Grip strength (lbs)    (Blank rows = not tested)    PALPATION:  Pt with expected tenderness around the incision sites and still covered with the bandaging.                                                                                                                               TREATMENT DATE: 01/15/24  ***  TherEx:  Seated pulleys into L shoulder flexion, 3x10 Seated pulleys into L shoulder abduction, 3x10  Seated scapular retractions, 1-2 sec holds, 2x10   Supine AAROM chest press with PVC pipe, 2x15  Verbal cues for the R UE doing majority of the work  Supine AAROM shoulder flexion with PVC pipe, 2x10  Verbal cuing for thigh to ~90 deg of forward flexion before pain sets in  Supine AAROM shoulder flexion/extension with PVC pipe, 2x10     PATIENT EDUCATION: Education details: Exercise technique Person educated: Patient Education method: Explanation Education  comprehension: verbalized understanding  HOME EXERCISE PROGRAM: Access Code: [email or web address] URL: [email or web address] Date: 12/11/2023 Prepared by: Sidra Simpers  Exercises - Circular Shoulder Pendulum with Table Support  - 1 x daily - 7 x weekly - 3 sets - 10 reps - Flexion-Extension Shoulder Pendulum with Table Support  - 1 x daily - 7 x weekly - 3 sets - 10 reps - Horizontal Shoulder Pendulum with Table Support  - 1 x daily - 7 x weekly - 3 sets - 10 reps - Supine Scapular Retraction  - 1 x daily - 7 x weekly - 3 sets - 10 reps - Seated Shoulder Shrug Circles AROM Backward  - 1 x daily - 7 x weekly - 3 sets - 10 reps - Wrist AROM Flexion Extension  - 1 x daily - 7 x weekly - 3 sets - 10 reps - Elbow Flexion PROM  - 1 x daily - 7 x weekly - 3 sets - 10 reps - Elbow Extension PROM  - 1 x daily - 7 x weekly - 3 sets - 10 reps - Forearm Pronation PROM  - 1 x daily - 7 x weekly - 3 sets - 10 reps - Forearm Supination PROM  - 1 x daily - 7 x weekly - 3 sets - 10 reps   Access Code: [email or web address]  URL: [email or web address]  Date: 10/05/2023  Prepared by: Marina  Moser  Exercises  - Standing shoulder flexion wall slides  - 1 x daily - 7 x weekly - 2 sets - 10  reps - 5 hold  - Standing Shoulder Abduction Slides at Wall  - 1 x daily - 7 x weekly - 2 sets - 10 reps - 5 hold  - Standing Isometric Shoulder Flexion with Doorway - Arm Bent  - 1 x daily - 7 x weekly - 2 sets - 10 reps - 5 hold  - Standing Isometric Shoulder Abduction with Doorway - Arm Bent  - 1 x daily - 7 x weekly - 2 sets - 10 reps - 5 hold  - Standing Isometric Shoulder Internal Rotation at Doorway  - 1 x daily - 7 x weekly - 2 sets - 10 reps - 5 hold  - Standing Isometric Shoulder Extension with Doorway - Arm Bent  - 1 x daily - 7 x weekly - 2 sets - 10 reps - 5 hold  - Seated Shoulder Pendulum Exercise  - 1 x daily - 7 x weekly - 2 sets - 10 reps - 5 hold   ASSESSMENT:  CLINICAL IMPRESSION:     ***     OBJECTIVE IMPAIRMENTS: Abnormal gait, decreased activity tolerance, decreased endurance, decreased mobility, difficulty walking, decreased ROM, decreased strength, hypomobility, increased edema, impaired perceived functional ability, impaired flexibility, impaired UE functional use, improper body mechanics, postural dysfunction, and pain.   ACTIVITY LIMITATIONS: carrying, lifting, bending, sitting, standing, squatting, stairs, transfers, bed mobility, bathing, dressing, self feeding, reach over head, hygiene/grooming, locomotion level, and caring for others  PARTICIPATION LIMITATIONS: meal prep, cleaning, laundry, driving, shopping, community activity, occupation, and yard work  PERSONAL FACTORS: Age, Fitness, Profession, and 3+ comorbidities: anemia, arthritis (back, L elbow, L hip, L ankle), asthma, DM, GERD, headache, hyperparathyroidism, lymphedema, seizure, sleep apnea are also affecting patient's functional outcome.   REHAB POTENTIAL: Good  CLINICAL DECISION MAKING: Evolving/moderate complexity  EVALUATION COMPLEXITY: Moderate   GOALS: Goals reviewed with patient? Yes  SHORT TERM GOALS: Target date: 10/24/2023  Patient will be independent in home exercise program to improve strength/mobility for better functional independence with ADLs. Baseline:See HEP above; 11/17/2023=Patient able to verbalize and today demonstrate good understanding of HEP especially now since she has received the results from MRI.  Goal status: MET   LONG TERM GOALS: Target date: 11/28/2023  Patient will decrease Quick DASH score by > 8 points demonstrating reduced self-reported upper extremity disability. Baseline: 68.2/100 01/10/24:  Goal status: INITIAL  2.  Patient will improve L shoulder AROM to > 140 degrees of flexion, scaption, and abduction for improved ability to perform overhead activities. Baseline: avoided AROM due to surgical procedure 01/10/24: avoided AROM due to surgical  procedure Goal status: INITIAL  3.  Patient will report a worst pain of 6/10 on VAS in L shoulder to improve tolerance with ADLs and reduced symptoms with activities. Baseline: 10/10 at worst 01/10/24: 3/10 at worst Goal status: MET  4.  Patient will be able to tolerate holding her young grandson without an increase in L shoulder pain to show improved L UE function and increase pt's ability to participate in family care. Baseline: Pt is unable to pick up grandson due to precautions of the L shoulder following RTC repair 01/10/24: Unable to do so at this time due to lifting restrictions and AROM restrictions Goal status: ONGOING   PLAN:  PT FREQUENCY: 2x/week  PT DURATION: 8 weeks  PLANNED INTERVENTIONS: 97164- PT Re-evaluation, 97750- Physical Performance Testing, 97110-Therapeutic exercises, 97530- Therapeutic activity, [id number]- Neuromuscular re-education, 97535- Self Care, 02859- Manual therapy, [id number]- Gait training, [id number]- Orthotic Initial, [id number]- Orthotic/Prosthetic subsequent,  04007- Canalith repositioning, 02239- Splinting, 02967- Electrical stimulation (manual), [id number]- Ultrasound, [id number]- Traction (mechanical), [id number]- Parrafin, 79439 (1-2 muscles), 20561 (3+ muscles)- Dry Needling, Patient/Family education, Balance training, Stair training, Taping, Joint mobilization, Spinal mobilization, Scar mobilization, Vestibular training, DME instructions, Cryotherapy, and Moist heat   PLAN FOR NEXT SESSION:  *** Continue improving tolerance to AAROM and introduce pulley's and maybe ranger.   Fonda Simpers, PT, DPT Physical Therapist - [facility]  01/15/24, 7:33 AM

## 2024-01-17 ENCOUNTER — Ambulatory Visit: Payer: Worker's Compensation

## 2024-01-17 DIAGNOSIS — M25512 Pain in left shoulder: Secondary | ICD-10-CM | POA: Diagnosis not present

## 2024-01-17 DIAGNOSIS — M25612 Stiffness of left shoulder, not elsewhere classified: Secondary | ICD-10-CM

## 2024-01-17 DIAGNOSIS — M6281 Muscle weakness (generalized): Secondary | ICD-10-CM

## 2024-01-17 DIAGNOSIS — M25562 Pain in left knee: Secondary | ICD-10-CM

## 2024-01-17 NOTE — Therapy (Addendum)
 OUTPATIENT PHYSICAL THERAPY L SHOULDER TREATMENT   Patient Name: Ashley [name] MRN: [id number] DOB:November 24, 1965, 58 y.o., female Today's Date: 01/17/2024  END OF SESSION:   PT End of Session - 01/17/24 0740     Visit Number 12    Number of Visits 24    Date for Recertification  03/04/24    PT Start Time 0736    PT Stop Time 0800    PT Time Calculation (min) 24 min    Activity Tolerance Patient tolerated treatment well;Patient limited by pain    Behavior During Therapy [facility] for tasks assessed/performed         Past Medical History:  Diagnosis Date   Allergy    Anemia    Arthritis    back, left elbow, left hip, left ankle    Asthma    Blood transfusion without reported diagnosis    Complication of anesthesia    delayed emergence   Diabetes mellitus without complication (HCC)    diet controlled   Diverticulosis    GERD (gastroesophageal reflux disease)    Headache    History of hiatal hernia    History of kidney stones    Hyperparathyroidism    Lymphedema    PONV (postoperative nausea and vomiting)    S/P subtotal parathyroidectomy    Seizure (HCC)    as a child   Sleep apnea    cannot tolerate mask   Thyroid  nodule 06/20/2018   Varicose vein of leg    Past Surgical History:  Procedure Laterality Date   ACETABULUM FRACTURE SURGERY Left    CESAREAN SECTION     CHOLECYSTECTOMY     COLONOSCOPY WITH PROPOFOL  N/A 05/31/2021   Procedure: COLONOSCOPY WITH PROPOFOL ;  Surgeon: Maryruth Ole DASEN, MD;  Location: [facility] ENDOSCOPY;  Service: Endoscopy;  Laterality: N/A;   ELBOW FRACTURE SURGERY  1995   ESOPHAGOGASTRODUODENOSCOPY (EGD) WITH PROPOFOL  N/A 05/31/2021   Procedure: ESOPHAGOGASTRODUODENOSCOPY (EGD) WITH PROPOFOL ;  Surgeon: Maryruth Ole DASEN, MD;  Location: [facility] ENDOSCOPY;  Service: Endoscopy;  Laterality: N/A;   FRACTURE SURGERY     left ankle complete repair, left elbow   HERNIA REPAIR     incisional   LAPAROSCOPIC GASTRIC SLEEVE RESECTION   2014   PARATHYROIDECTOMY N/A 10/25/2018   Procedure: PARATHYROIDECTOMY, DIABETIC, SLEEP APNEA;  Surgeon: Marolyn Nest, MD;  Location: [facility] ORS;  Service: General;  Laterality: N/A;   TONSILLECTOMY AND ADENOIDECTOMY N/A 12/08/2015   Procedure: TONSILLECTOMY AND ADENOIDECTOMY;  Surgeon: Chinita Hasten, MD;  Location: [facility] ORS;  Service: ENT;  Laterality: N/A;   TOTAL HIP ARTHROPLASTY Left 09/22/2022   Procedure: Left posterior total hip arthroplasty;  Surgeon: Lorelle Hussar, MD;  Location: [facility] ORS;  Service: Orthopedics;  Laterality: Left;   TUBAL LIGATION     Patient Active Problem List   Diagnosis Date Noted   Osteoarthritis of left hip 09/22/2022   Side pain 08/16/2022   Musculoskeletal pain 08/16/2022   Varicose veins of both legs with edema 01/05/2021   Swelling of limb 12/30/2020   Diabetes (HCC) 12/30/2020   Lymphedema 12/30/2020   S/P subtotal parathyroidectomy 11/08/2018   Hyperparathyroidism 06/20/2018   Thyroid  nodule 06/20/2018   Intractable pain 04/12/2015    PCP:  Ophelia Sage, MD  REFERRING PROVIDER: Norleen Gavel, MD  REFERRING DIAG:  [phone] (ICD-10-CM) - Left shoulder pain  M25.562 (ICD-10-CM) - Left knee pain    THERAPY DIAG:  Acute pain of left shoulder  Decreased ROM of left shoulder  Muscle weakness (generalized)  Acute pain  of left knee  Rationale for Evaluation and Treatment: Rehabilitation  ONSET DATE: 08/30/23  SUBJECTIVE:                                                                                                                                                                                      SUBJECTIVE STATEMENT:  Pt reports that her MD visit went well and that the MD was happy with the progress made and improved ROM. MD wrote a new order for therapy and is ok with moving into AAROM and more active.     Hand dominance: Right  PERTINENT HISTORY: Pt was seen at [facility] ED on 08/30/23 after a fall sustained at work. Pt works in the  emergency room and reported that she slipped on a wet spot and fell onto her left shoulder and knee to avoid falling onto her L hip. Per ED note: Physical exam is generally reassuring. Shoulder joint itself has normal mobility and I am not concerned about dislocation. No evidence of clavicular injury. Mild proximal humerus fracture is possible and the patient is concerned about the fact that she has had multiple prior orthopedic injuries/fractures due to relatively minor falls. We will proceed with x-rays and anticipate restricted movement with shoulder sling, RICE, etc. Pt underwent L THA last June and has previous history of elbow surgery that prevents full movement of L wrist. Pt reports that she wore the sling provided in ED for a couple of weeks after the fall but has not worn it since.  PMH includes anemia, arthritis (back, L elbow, L hip, L ankle), asthma, DM, GERD, headache, hyperparathyroidism, lymphedema, seizure, sleep apnea  PAIN: Reported on L shoulder: Are you having pain? Yes: NPRS scale: 4/10 currently, worst 10/10 with movement, best: 4/10  Pain location: anterior shoulder Pain description: Sharp when it hits. Aggravating factors: reaching overhead/behind head, movement Relieving factors: Voltaren, Biofreeze, lidocaine  patches, rest  PRECAUTIONS: None  RED FLAGS: None   WEIGHT BEARING RESTRICTIONS: No  FALLS:  Has patient fallen in last 6 months? Yes. Number of falls 1- pt reports that this incident is her only fall  LIVING ENVIRONMENT: Lives with: lives with their spouse Lives in: Mobile home Stairs: Yes: External: 4 steps; can reach both Has following equipment at home: Single point cane and Walker - 2 wheeled  OCCUPATION: Works in ED at [employer]- pt reports that the pain hasn't affected her job duties that much because she is R handed  PLOF: Independent  PATIENT GOALS: Pt wants to be able to hold her 59 month old grandson, get range of motion back to use her L arm  normally.  NEXT MD VISIT: 10/12/23  OBJECTIVE:  Note: Objective measures were completed at Evaluation unless otherwise noted.  DIAGNOSTIC FINDINGS:  DG Humerus L (08/30/23): FINDINGS: There is no evidence of an acute fracture or dislocation. Radiopaque surgical screws and fixation wires are seen within the left olecranon process and left radial head. Soft tissues are unremarkable.   IMPRESSION: 1. No acute osseous abnormality. 2. Postoperative changes of the left elbow.   PATIENT SURVEYS:   QuickDASH Score: 68.2 / 100 = 68.2 %  COGNITION: Overall cognitive status: Within functional limits for tasks assessed     SENSATION: WFL- bilateral UE and LE  POSTURE: Pt holds L UE in guarded position at rest. L shoulder in sling upon arrival, but doffs with ease.   UPPER EXTREMITY ROM:   Active ROM Right eval Left PROM eval  Shoulder flexion 155 90  Shoulder extension    Shoulder abduction 120 90  Shoulder adduction    Shoulder internal rotation 75 NT  Shoulder external rotation 50 NT  Elbow flexion    Elbow extension    Wrist flexion    Wrist extension    Wrist ulnar deviation    Wrist radial deviation    Wrist pronation    Wrist supination    (Blank rows = not tested)   UPPER EXTREMITY MMT:  MMT Right eval Left eval  Shoulder flexion 4 NT  Shoulder extension    Shoulder abduction 3+ NT  Shoulder adduction    Shoulder internal rotation    Shoulder external rotation    Middle trapezius    Lower trapezius    Elbow flexion 4 NT  Elbow extension 4 NT  Wrist flexion    Wrist extension    Wrist ulnar deviation    Wrist radial deviation    Wrist pronation    Wrist supination    Grip strength (lbs)    (Blank rows = not tested)    PALPATION:  Pt with expected tenderness around the incision sites and still covered with the bandaging.                                                                                                                                TREATMENT DATE: 01/17/24   TherEx:  Seated pulleys into L shoulder flexion, 3 min Seated pulleys into L shoulder abduction, 3 min  Standing AAROM L shoulder flexion with use of the UE Ranger, 2x10  Standing AAROM L shoulder abduction/adduction with the use of UE, 2x10 (washing window motion side-to-side)  Standing AAROM of L shoulder into circular patterns (CW/CCW like washing a table) with use of UE Ranger on floor, 2x10 each direction Standing scapular retractions, YTB resistance, 2x10  Standing ball rolls up the wall with AAROM of the L shoulder, 2x10     PATIENT EDUCATION: Education details: Exercise technique Person educated: Patient Education method: Explanation Education comprehension: verbalized understanding  HOME EXERCISE PROGRAM: Access Code: [email or web address] URL: [email or web address] Date: 12/11/2023 Prepared by: Sidra  Tranae Laramie  Exercises - Research Scientist (physical Sciences) Pendulum with Table Support  - 1 x daily - 7 x weekly - 3 sets - 10 reps - Flexion-Extension Shoulder Pendulum with Table Support  - 1 x daily - 7 x weekly - 3 sets - 10 reps - Horizontal Shoulder Pendulum with Table Support  - 1 x daily - 7 x weekly - 3 sets - 10 reps - Supine Scapular Retraction  - 1 x daily - 7 x weekly - 3 sets - 10 reps - Seated Shoulder Shrug Circles AROM Backward  - 1 x daily - 7 x weekly - 3 sets - 10 reps - Wrist AROM Flexion Extension  - 1 x daily - 7 x weekly - 3 sets - 10 reps - Elbow Flexion PROM  - 1 x daily - 7 x weekly - 3 sets - 10 reps - Elbow Extension PROM  - 1 x daily - 7 x weekly - 3 sets - 10 reps - Forearm Pronation PROM  - 1 x daily - 7 x weekly - 3 sets - 10 reps - Forearm Supination PROM  - 1 x daily - 7 x weekly - 3 sets - 10 reps   Access Code: [email or web address]  URL: [email or web address]  Date: 10/05/2023  Prepared by: Marina  Moser  Exercises  - Standing shoulder flexion wall slides  - 1 x daily - 7 x weekly - 2 sets - 10 reps - 5 hold  -  Standing Shoulder Abduction Slides at Wall  - 1 x daily - 7 x weekly - 2 sets - 10 reps - 5 hold  - Standing Isometric Shoulder Flexion with Doorway - Arm Bent  - 1 x daily - 7 x weekly - 2 sets - 10 reps - 5 hold  - Standing Isometric Shoulder Abduction with Doorway - Arm Bent  - 1 x daily - 7 x weekly - 2 sets - 10 reps - 5 hold  - Standing Isometric Shoulder Internal Rotation at Doorway  - 1 x daily - 7 x weekly - 2 sets - 10 reps - 5 hold  - Standing Isometric Shoulder Extension with Doorway - Arm Bent  - 1 x daily - 7 x weekly - 2 sets - 10 reps - 5 hold  - Seated Shoulder Pendulum Exercise  - 1 x daily - 7 x weekly - 2 sets - 10 reps - 5 hold   ASSESSMENT:  CLINICAL IMPRESSION:    Pt session limited due to late arrival which continues to be a consistent issue.  Pt is still making good progress and is progressing with the mobility of the shoulder without any increase in pain.  Pt encouraged to perform newly introduced exercises and was given yellow theraband to perform the resisted scapular retractions at home.   Pt will continue to benefit from skilled therapy to address remaining deficits in order to improve overall QoL and return to PLOF.         OBJECTIVE IMPAIRMENTS: Abnormal gait, decreased activity tolerance, decreased endurance, decreased mobility, difficulty walking, decreased ROM, decreased strength, hypomobility, increased edema, impaired perceived functional ability, impaired flexibility, impaired UE functional use, improper body mechanics, postural dysfunction, and pain.   ACTIVITY LIMITATIONS: carrying, lifting, bending, sitting, standing, squatting, stairs, transfers, bed mobility, bathing, dressing, self feeding, reach over head, hygiene/grooming, locomotion level, and caring for others  PARTICIPATION LIMITATIONS: meal prep, cleaning, laundry, driving, shopping, community activity, occupation, and yard work  PERSONAL FACTORS: Age, Fitness, Profession, and 3+  comorbidities: anemia, arthritis (back, L elbow, L hip, L ankle), asthma, DM, GERD, headache, hyperparathyroidism, lymphedema, seizure, sleep apnea are also affecting patient's functional outcome.   REHAB POTENTIAL: Good  CLINICAL DECISION MAKING: Evolving/moderate complexity  EVALUATION COMPLEXITY: Moderate   GOALS: Goals reviewed with patient? Yes  SHORT TERM GOALS: Target date: 10/24/2023  Patient will be independent in home exercise program to improve strength/mobility for better functional independence with ADLs. Baseline:See HEP above; 11/17/2023=Patient able to verbalize and today demonstrate good understanding of HEP especially now since she has received the results from MRI.  Goal status: MET   LONG TERM GOALS: Target date: 11/28/2023  Patient will decrease Quick DASH score by > 8 points demonstrating reduced self-reported upper extremity disability. Baseline: 68.2/100 01/10/24: 81.8% Goal status: INITIAL  2.  Patient will improve L shoulder AROM to > 140 degrees of flexion, scaption, and abduction for improved ability to perform overhead activities. Baseline: avoided AROM due to surgical procedure 01/10/24: avoided AROM due to surgical procedure Goal status: INITIAL  3.  Patient will report a worst pain of 6/10 on VAS in L shoulder to improve tolerance with ADLs and reduced symptoms with activities. Baseline: 10/10 at worst 01/10/24: 3/10 at worst Goal status: MET  4.  Patient will be able to tolerate holding her young grandson without an increase in L shoulder pain to show improved L UE function and increase pt's ability to participate in family care. Baseline: Pt is unable to pick up grandson due to precautions of the L shoulder following RTC repair 01/10/24: Unable to do so at this time due to lifting restrictions and AROM restrictions Goal status: ONGOING   PLAN:  PT FREQUENCY: 2x/week  PT DURATION: 8 weeks  PLANNED INTERVENTIONS: 97164- PT Re-evaluation,  97750- Physical Performance Testing, 97110-Therapeutic exercises, 97530- Therapeutic activity, [id number]- Neuromuscular re-education, 97535- Self Care, [id number]- Manual therapy, [id number]- Gait training, [id number]- Orthotic Initial, [id number]- Orthotic/Prosthetic subsequent, [phone]- Canalith repositioning, [id number]- Splinting, [id number]- Electrical stimulation (manual), [id number]- Ultrasound, [id number]- Traction (mechanical), [id number]- Parrafin, 20560 (1-2 muscles), 20561 (3+ muscles)- Dry Needling, Patient/Family education, Balance training, Stair training, Taping, Joint mobilization, Spinal mobilization, Scar mobilization, Vestibular training, DME instructions, Cryotherapy, and Moist heat   PLAN FOR NEXT SESSION:    Introduce pt to isometric exercises   Fonda Simpers, PT, DPT Physical Therapist - [facility]  [facility]  01/17/24, 9:36 AM

## 2024-01-22 ENCOUNTER — Ambulatory Visit

## 2024-01-22 NOTE — Therapy (Incomplete)
 OUTPATIENT PHYSICAL THERAPY L SHOULDER TREATMENT   Patient Name: Ashley [name] MRN: [id number] DOB:[birth year], 58 y.o., female Today's Date: 01/22/2024  END OF SESSION:    Past Medical History:  Diagnosis Date   Allergy    Anemia    Arthritis    back, left elbow, left hip, left ankle    Asthma    Blood transfusion without reported diagnosis    Complication of anesthesia    delayed emergence   Diabetes mellitus without complication (HCC)    diet controlled   Diverticulosis    GERD (gastroesophageal reflux disease)    Headache    History of hiatal hernia    History of kidney stones    Hyperparathyroidism    Lymphedema    PONV (postoperative nausea and vomiting)    S/P subtotal parathyroidectomy    Seizure (HCC)    as a child   Sleep apnea    cannot tolerate mask   Thyroid  nodule 06/20/2018   Varicose vein of leg    Past Surgical History:  Procedure Laterality Date   ACETABULUM FRACTURE SURGERY Left    CESAREAN SECTION     CHOLECYSTECTOMY     COLONOSCOPY WITH PROPOFOL  N/A 05/31/2021   Procedure: COLONOSCOPY WITH PROPOFOL ;  Surgeon: Maryruth Ole DASEN, MD;  Location: [facility];  Service: Endoscopy;  Laterality: N/A;   ELBOW FRACTURE SURGERY  1995   ESOPHAGOGASTRODUODENOSCOPY (EGD) WITH PROPOFOL  N/A 05/31/2021   Procedure: ESOPHAGOGASTRODUODENOSCOPY (EGD) WITH PROPOFOL ;  Surgeon: Maryruth Ole DASEN, MD;  Location: [facility];  Service: Endoscopy;  Laterality: N/A;   FRACTURE SURGERY     left ankle complete repair, left elbow   HERNIA REPAIR     incisional   LAPAROSCOPIC GASTRIC SLEEVE RESECTION  2014   PARATHYROIDECTOMY N/A 10/25/2018   Procedure: PARATHYROIDECTOMY, DIABETIC, SLEEP APNEA;  Surgeon: Marolyn Nest, MD;  Location: [facility];  Service: General;  Laterality: N/A;   TONSILLECTOMY AND ADENOIDECTOMY N/A 12/08/2015   Procedure: TONSILLECTOMY AND ADENOIDECTOMY;  Surgeon: Chinita Hasten, MD;  Location: [facility];  Service: ENT;   Laterality: N/A;   TOTAL HIP ARTHROPLASTY Left 09/22/2022   Procedure: Left posterior total hip arthroplasty;  Surgeon: Lorelle Hussar, MD;  Location: [facility];  Service: Orthopedics;  Laterality: Left;   TUBAL LIGATION     Patient Active Problem List   Diagnosis Date Noted   Osteoarthritis of left hip 09/22/2022   Side pain 08/16/2022   Musculoskeletal pain 08/16/2022   Varicose veins of both legs with edema 01/05/2021   Swelling of limb 12/30/2020   Diabetes (HCC) 12/30/2020   Lymphedema 12/30/2020   S/P subtotal parathyroidectomy 11/08/2018   Hyperparathyroidism 06/20/2018   Thyroid  nodule 06/20/2018   Intractable pain 04/12/2015    PCP:  Ophelia Sage, MD  REFERRING PROVIDER: Norleen Gavel, MD  REFERRING DIAG:  [phone] (ICD-10-CM) - Left shoulder pain  M25.562 (ICD-10-CM) - Left knee pain    THERAPY DIAG:  No diagnosis found.  Rationale for Evaluation and Treatment: Rehabilitation  ONSET DATE: 08/30/23  SUBJECTIVE:  SUBJECTIVE STATEMENT:  ***    Hand dominance: Right  PERTINENT HISTORY: Pt was seen at [facility] ED on 08/30/23 after a fall sustained at work. Pt works in the emergency room and reported that she slipped on a wet spot and fell onto her left shoulder and knee to avoid falling onto her L hip. Per ED note: Physical exam is generally reassuring. Shoulder joint itself has normal mobility and I am not concerned about dislocation. No evidence of clavicular injury. Mild proximal humerus fracture is possible and the patient is concerned about the fact that she has had multiple prior orthopedic injuries/fractures due to relatively minor falls. We will proceed with x-rays and anticipate restricted movement with shoulder sling, RICE, etc. Pt underwent L THA last June and has previous history of  elbow surgery that prevents full movement of L wrist. Pt reports that she wore the sling provided in ED for a couple of weeks after the fall but has not worn it since.  PMH includes anemia, arthritis (back, L elbow, L hip, L ankle), asthma, DM, GERD, headache, hyperparathyroidism, lymphedema, seizure, sleep apnea  PAIN: Reported on L shoulder: Are you having pain? Yes: NPRS scale: 4/10 currently, worst 10/10 with movement, best: 4/10  Pain location: anterior shoulder Pain description: Sharp when it hits. Aggravating factors: reaching overhead/behind head, movement Relieving factors: Voltaren, Biofreeze, lidocaine  patches, rest  PRECAUTIONS: None  RED FLAGS: None   WEIGHT BEARING RESTRICTIONS: No  FALLS:  Has patient fallen in last 6 months? Yes. Number of falls 1- pt reports that this incident is her only fall  LIVING ENVIRONMENT: Lives with: lives with their spouse Lives in: Mobile home Stairs: Yes: External: 4 steps; can reach both Has following equipment at home: Single point cane and Walker - 2 wheeled  OCCUPATION: Works in ED at [employer]- pt reports that the pain hasn't affected her job duties that much because she is R handed  PLOF: Independent  PATIENT GOALS: Pt wants to be able to hold her 80 month old grandson, get range of motion back to use her L arm normally.  NEXT MD VISIT: 10/12/23  OBJECTIVE:  Note: Objective measures were completed at Evaluation unless otherwise noted.  DIAGNOSTIC FINDINGS:  DG Humerus L (08/30/23): FINDINGS: There is no evidence of an acute fracture or dislocation. Radiopaque surgical screws and fixation wires are seen within the left olecranon process and left radial head. Soft tissues are unremarkable.   IMPRESSION: 1. No acute osseous abnormality. 2. Postoperative changes of the left elbow.   PATIENT SURVEYS:   QuickDASH Score: 68.2 / 100 = 68.2 %  COGNITION: Overall cognitive status: Within functional limits for tasks  assessed     SENSATION: WFL- bilateral UE and LE  POSTURE: Pt holds L UE in guarded position at rest. L shoulder in sling upon arrival, but doffs with ease.   UPPER EXTREMITY ROM:   Active ROM Right eval Left PROM eval  Shoulder flexion 155 90  Shoulder extension    Shoulder abduction 120 90  Shoulder adduction    Shoulder internal rotation 75 NT  Shoulder external rotation 50 NT  Elbow flexion    Elbow extension    Wrist flexion    Wrist extension    Wrist ulnar deviation    Wrist radial deviation    Wrist pronation    Wrist supination    (Blank rows = not tested)   UPPER EXTREMITY MMT:  MMT Right eval Left eval  Shoulder flexion 4 NT  Shoulder extension    Shoulder abduction 3+ NT  Shoulder adduction    Shoulder internal rotation    Shoulder external rotation    Middle trapezius    Lower trapezius    Elbow flexion 4 NT  Elbow extension 4 NT  Wrist flexion    Wrist extension    Wrist ulnar deviation    Wrist radial deviation    Wrist pronation    Wrist supination    Grip strength (lbs)    (Blank rows = not tested)    PALPATION:  Pt with expected tenderness around the incision sites and still covered with the bandaging.                                                                                                                               TREATMENT DATE: 01/22/24  ***  TherEx:  Seated pulleys into L shoulder flexion, 3 min Seated pulleys into L shoulder abduction, 3 min  Standing AAROM L shoulder flexion with use of the UE Ranger, 2x10  Standing AAROM L shoulder abduction/adduction with the use of UE, 2x10 (washing window motion side-to-side)  Standing AAROM of L shoulder into circular patterns (CW/CCW like washing a table) with use of UE Ranger on floor, 2x10 each direction Standing scapular retractions, YTB resistance, 2x10  Standing ball rolls up the wall with AAROM of the L shoulder, 2x10     PATIENT EDUCATION: Education  details: Exercise technique Person educated: Patient Education method: Explanation Education comprehension: verbalized understanding  HOME EXERCISE PROGRAM: Access Code: [email or web address] URL: [email or web address] Date: 12/11/2023 Prepared by: Sidra Simpers  Exercises - Circular Shoulder Pendulum with Table Support  - 1 x daily - 7 x weekly - 3 sets - 10 reps - Flexion-Extension Shoulder Pendulum with Table Support  - 1 x daily - 7 x weekly - 3 sets - 10 reps - Horizontal Shoulder Pendulum with Table Support  - 1 x daily - 7 x weekly - 3 sets - 10 reps - Supine Scapular Retraction  - 1 x daily - 7 x weekly - 3 sets - 10 reps - Seated Shoulder Shrug Circles AROM Backward  - 1 x daily - 7 x weekly - 3 sets - 10 reps - Wrist AROM Flexion Extension  - 1 x daily - 7 x weekly - 3 sets - 10 reps - Elbow Flexion PROM  - 1 x daily - 7 x weekly - 3 sets - 10 reps - Elbow Extension PROM  - 1 x daily - 7 x weekly - 3 sets - 10 reps - Forearm Pronation PROM  - 1 x daily - 7 x weekly - 3 sets - 10 reps - Forearm Supination PROM  - 1 x daily - 7 x weekly - 3 sets - 10 reps   Access Code: [email or web address]  URL: [email or web address]  Date: 10/05/2023  Prepared by: Marina  Moser  Exercises  - Standing shoulder flexion wall slides  -  1 x daily - 7 x weekly - 2 sets - 10 reps - 5 hold  - Standing Shoulder Abduction Slides at Wall  - 1 x daily - 7 x weekly - 2 sets - 10 reps - 5 hold  - Standing Isometric Shoulder Flexion with Doorway - Arm Bent  - 1 x daily - 7 x weekly - 2 sets - 10 reps - 5 hold  - Standing Isometric Shoulder Abduction with Doorway - Arm Bent  - 1 x daily - 7 x weekly - 2 sets - 10 reps - 5 hold  - Standing Isometric Shoulder Internal Rotation at Doorway  - 1 x daily - 7 x weekly - 2 sets - 10 reps - 5 hold  - Standing Isometric Shoulder Extension with Doorway - Arm Bent  - 1 x daily - 7 x weekly - 2 sets - 10 reps - 5 hold  - Seated Shoulder Pendulum Exercise  - 1 x  daily - 7 x weekly - 2 sets - 10 reps - 5 hold   ASSESSMENT:  CLINICAL IMPRESSION:    ***  Pt session limited due to late arrival which continues to be a consistent issue.  Pt is still making good progress and is progressing with the mobility of the shoulder without any increase in pain.  Pt encouraged to perform newly introduced exercises and was given yellow theraband to perform the resisted scapular retractions at home.   Pt will continue to benefit from skilled therapy to address remaining deficits in order to improve overall QoL and return to PLOF.         OBJECTIVE IMPAIRMENTS: Abnormal gait, decreased activity tolerance, decreased endurance, decreased mobility, difficulty walking, decreased ROM, decreased strength, hypomobility, increased edema, impaired perceived functional ability, impaired flexibility, impaired UE functional use, improper body mechanics, postural dysfunction, and pain.   ACTIVITY LIMITATIONS: carrying, lifting, bending, sitting, standing, squatting, stairs, transfers, bed mobility, bathing, dressing, self feeding, reach over head, hygiene/grooming, locomotion level, and caring for others  PARTICIPATION LIMITATIONS: meal prep, cleaning, laundry, driving, shopping, community activity, occupation, and yard work  PERSONAL FACTORS: Age, Fitness, Profession, and 3+ comorbidities: anemia, arthritis (back, L elbow, L hip, L ankle), asthma, DM, GERD, headache, hyperparathyroidism, lymphedema, seizure, sleep apnea are also affecting patient's functional outcome.   REHAB POTENTIAL: Good  CLINICAL DECISION MAKING: Evolving/moderate complexity  EVALUATION COMPLEXITY: Moderate   GOALS: Goals reviewed with patient? Yes  SHORT TERM GOALS: Target date: 10/24/2023  Patient will be independent in home exercise program to improve strength/mobility for better functional independence with ADLs. Baseline:See HEP above; 11/17/2023=Patient able to verbalize and today demonstrate  good understanding of HEP especially now since she has received the results from MRI.  Goal status: MET   LONG TERM GOALS: Target date: 11/28/2023  Patient will decrease Quick DASH score by > 8 points demonstrating reduced self-reported upper extremity disability. Baseline: 68.2/100 01/10/24:  Goal status: INITIAL  2.  Patient will improve L shoulder AROM to > 140 degrees of flexion, scaption, and abduction for improved ability to perform overhead activities. Baseline: avoided AROM due to surgical procedure 01/10/24: avoided AROM due to surgical procedure Goal status: INITIAL  3.  Patient will report a worst pain of 6/10 on VAS in L shoulder to improve tolerance with ADLs and reduced symptoms with activities. Baseline: 10/10 at worst 01/10/24: 3/10 at worst Goal status: MET  4.  Patient will be able to tolerate holding her young grandson without an increase in L shoulder  pain to show improved L UE function and increase pt's ability to participate in family care. Baseline: Pt is unable to pick up grandson due to precautions of the L shoulder following RTC repair 01/10/24: Unable to do so at this time due to lifting restrictions and AROM restrictions Goal status: ONGOING   PLAN:  PT FREQUENCY: 2x/week  PT DURATION: 8 weeks  PLANNED INTERVENTIONS: 97164- PT Re-evaluation, 97750- Physical Performance Testing, 97110-Therapeutic exercises, 97530- Therapeutic activity, [id number]- Neuromuscular re-education, 97535- Self Care, [id number]- Manual therapy, [id number]- Gait training, [id number]- Orthotic Initial, [id number]- Orthotic/Prosthetic subsequent, [phone]- Canalith repositioning, [id number]- Splinting, [id number]- Electrical stimulation (manual), [id number]- Ultrasound, [id number]- Traction (mechanical), [id number]- Parrafin, 20560 (1-2 muscles), 20561 (3+ muscles)- Dry Needling, Patient/Family education, Balance training, Stair training, Taping, Joint mobilization, Spinal mobilization, Scar mobilization, Vestibular training, DME  instructions, Cryotherapy, and Moist heat   PLAN FOR NEXT SESSION:   Introduce pt to isometric exercises   Fonda Simpers, PT, DPT Physical Therapist - [facility]  [facility]  01/22/24, 7:17 AM

## 2024-01-23 ENCOUNTER — Ambulatory Visit: Payer: Worker's Compensation

## 2024-01-23 NOTE — Therapy (Incomplete)
 OUTPATIENT PHYSICAL THERAPY L SHOULDER TREATMENT   Patient Name: Ashley [name] MRN: [id number] DOB:23-Oct-1965, 58 y.o., female Today's Date: 01/23/2024  END OF SESSION:    Past Medical History:  Diagnosis Date   Allergy    Anemia    Arthritis    back, left elbow, left hip, left ankle    Asthma    Blood transfusion without reported diagnosis    Complication of anesthesia    delayed emergence   Diabetes mellitus without complication (HCC)    diet controlled   Diverticulosis    GERD (gastroesophageal reflux disease)    Headache    History of hiatal hernia    History of kidney stones    Hyperparathyroidism    Lymphedema    PONV (postoperative nausea and vomiting)    S/P subtotal parathyroidectomy    Seizure (HCC)    as a child   Sleep apnea    cannot tolerate mask   Thyroid  nodule 06/20/2018   Varicose vein of leg    Past Surgical History:  Procedure Laterality Date   ACETABULUM FRACTURE SURGERY Left    CESAREAN SECTION     CHOLECYSTECTOMY     COLONOSCOPY WITH PROPOFOL  N/A 05/31/2021   Procedure: COLONOSCOPY WITH PROPOFOL ;  Surgeon: Maryruth Ole DASEN, MD;  Location: [facility];  Service: Endoscopy;  Laterality: N/A;   ELBOW FRACTURE SURGERY  1995   ESOPHAGOGASTRODUODENOSCOPY (EGD) WITH PROPOFOL  N/A 05/31/2021   Procedure: ESOPHAGOGASTRODUODENOSCOPY (EGD) WITH PROPOFOL ;  Surgeon: Maryruth Ole DASEN, MD;  Location: [facility];  Service: Endoscopy;  Laterality: N/A;   FRACTURE SURGERY     left ankle complete repair, left elbow   HERNIA REPAIR     incisional   LAPAROSCOPIC GASTRIC SLEEVE RESECTION  2014   PARATHYROIDECTOMY N/A 10/25/2018   Procedure: PARATHYROIDECTOMY, DIABETIC, SLEEP APNEA;  Surgeon: Marolyn Nest, MD;  Location: [facility];  Service: General;  Laterality: N/A;   TONSILLECTOMY AND ADENOIDECTOMY N/A 12/08/2015   Procedure: TONSILLECTOMY AND ADENOIDECTOMY;  Surgeon: Chinita Hasten, MD;  Location: [facility];  Service: ENT;   Laterality: N/A;   TOTAL HIP ARTHROPLASTY Left 09/22/2022   Procedure: Left posterior total hip arthroplasty;  Surgeon: Lorelle Hussar, MD;  Location: [facility];  Service: Orthopedics;  Laterality: Left;   TUBAL LIGATION     Patient Active Problem List   Diagnosis Date Noted   Osteoarthritis of left hip 09/22/2022   Side pain 08/16/2022   Musculoskeletal pain 08/16/2022   Varicose veins of both legs with edema 01/05/2021   Swelling of limb 12/30/2020   Diabetes (HCC) 12/30/2020   Lymphedema 12/30/2020   S/P subtotal parathyroidectomy 11/08/2018   Hyperparathyroidism 06/20/2018   Thyroid  nodule 06/20/2018   Intractable pain 04/12/2015    PCP:  Ophelia Sage, MD  REFERRING PROVIDER: Norleen Gavel, MD  REFERRING DIAG:  [phone] (ICD-10-CM) - Left shoulder pain  M25.562 (ICD-10-CM) - Left knee pain    THERAPY DIAG:  No diagnosis found.  Rationale for Evaluation and Treatment: Rehabilitation  ONSET DATE: 08/30/23  SUBJECTIVE:  SUBJECTIVE STATEMENT:  ***    Hand dominance: Right  PERTINENT HISTORY: Pt was seen at [facility] ED on 08/30/23 after a fall sustained at work. Pt works in the emergency room and reported that she slipped on a wet spot and fell onto her left shoulder and knee to avoid falling onto her L hip. Per ED note: Physical exam is generally reassuring. Shoulder joint itself has normal mobility and I am not concerned about dislocation. No evidence of clavicular injury. Mild proximal humerus fracture is possible and the patient is concerned about the fact that she has had multiple prior orthopedic injuries/fractures due to relatively minor falls. We will proceed with x-rays and anticipate restricted movement with shoulder sling, RICE, etc. Pt underwent L THA last June and has previous history of  elbow surgery that prevents full movement of L wrist. Pt reports that she wore the sling provided in ED for a couple of weeks after the fall but has not worn it since.  PMH includes anemia, arthritis (back, L elbow, L hip, L ankle), asthma, DM, GERD, headache, hyperparathyroidism, lymphedema, seizure, sleep apnea  PAIN: Reported on L shoulder: Are you having pain? Yes: NPRS scale: 4/10 currently, worst 10/10 with movement, best: 4/10  Pain location: anterior shoulder Pain description: Sharp when it hits. Aggravating factors: reaching overhead/behind head, movement Relieving factors: Voltaren, Biofreeze, lidocaine  patches, rest  PRECAUTIONS: None  RED FLAGS: None   WEIGHT BEARING RESTRICTIONS: No  FALLS:  Has patient fallen in last 6 months? Yes. Number of falls 1- pt reports that this incident is her only fall  LIVING ENVIRONMENT: Lives with: lives with their spouse Lives in: Mobile home Stairs: Yes: External: 4 steps; can reach both Has following equipment at home: Single point cane and Walker - 2 wheeled  OCCUPATION: Works in ED at [employer]- pt reports that the pain hasn't affected her job duties that much because she is R handed  PLOF: Independent  PATIENT GOALS: Pt wants to be able to hold her 50 month old grandson, get range of motion back to use her L arm normally.  NEXT MD VISIT: 10/12/23  OBJECTIVE:  Note: Objective measures were completed at Evaluation unless otherwise noted.  DIAGNOSTIC FINDINGS:  DG Humerus L (08/30/23): FINDINGS: There is no evidence of an acute fracture or dislocation. Radiopaque surgical screws and fixation wires are seen within the left olecranon process and left radial head. Soft tissues are unremarkable.   IMPRESSION: 1. No acute osseous abnormality. 2. Postoperative changes of the left elbow.   PATIENT SURVEYS:   QuickDASH Score: 68.2 / 100 = 68.2 %  COGNITION: Overall cognitive status: Within functional limits for tasks  assessed     SENSATION: WFL- bilateral UE and LE  POSTURE: Pt holds L UE in guarded position at rest. L shoulder in sling upon arrival, but doffs with ease.   UPPER EXTREMITY ROM:   Active ROM Right eval Left PROM eval  Shoulder flexion 155 90  Shoulder extension    Shoulder abduction 120 90  Shoulder adduction    Shoulder internal rotation 75 NT  Shoulder external rotation 50 NT  Elbow flexion    Elbow extension    Wrist flexion    Wrist extension    Wrist ulnar deviation    Wrist radial deviation    Wrist pronation    Wrist supination    (Blank rows = not tested)   UPPER EXTREMITY MMT:  MMT Right eval Left eval  Shoulder flexion 4 NT  Shoulder extension    Shoulder abduction 3+ NT  Shoulder adduction    Shoulder internal rotation    Shoulder external rotation    Middle trapezius    Lower trapezius    Elbow flexion 4 NT  Elbow extension 4 NT  Wrist flexion    Wrist extension    Wrist ulnar deviation    Wrist radial deviation    Wrist pronation    Wrist supination    Grip strength (lbs)    (Blank rows = not tested)    PALPATION:  Pt with expected tenderness around the incision sites and still covered with the bandaging.                                                                                                                               TREATMENT DATE: 01/23/24  ***  TherEx:  Seated pulleys into L shoulder flexion, 3 min Seated pulleys into L shoulder abduction, 3 min  Standing AAROM L shoulder flexion with use of the UE Ranger, 2x10  Standing AAROM L shoulder abduction/adduction with the use of UE, 2x10 (washing window motion side-to-side)  Standing AAROM of L shoulder into circular patterns (CW/CCW like washing a table) with use of UE Ranger on floor, 2x10 each direction Standing scapular retractions, YTB resistance, 2x10  Standing ball rolls up the wall with AAROM of the L shoulder, 2x10     PATIENT EDUCATION: Education  details: Exercise technique Person educated: Patient Education method: Explanation Education comprehension: verbalized understanding  HOME EXERCISE PROGRAM: Access Code: [email or web address] URL: [email or web address] Date: 12/11/2023 Prepared by: Sidra Simpers  Exercises - Circular Shoulder Pendulum with Table Support  - 1 x daily - 7 x weekly - 3 sets - 10 reps - Flexion-Extension Shoulder Pendulum with Table Support  - 1 x daily - 7 x weekly - 3 sets - 10 reps - Horizontal Shoulder Pendulum with Table Support  - 1 x daily - 7 x weekly - 3 sets - 10 reps - Supine Scapular Retraction  - 1 x daily - 7 x weekly - 3 sets - 10 reps - Seated Shoulder Shrug Circles AROM Backward  - 1 x daily - 7 x weekly - 3 sets - 10 reps - Wrist AROM Flexion Extension  - 1 x daily - 7 x weekly - 3 sets - 10 reps - Elbow Flexion PROM  - 1 x daily - 7 x weekly - 3 sets - 10 reps - Elbow Extension PROM  - 1 x daily - 7 x weekly - 3 sets - 10 reps - Forearm Pronation PROM  - 1 x daily - 7 x weekly - 3 sets - 10 reps - Forearm Supination PROM  - 1 x daily - 7 x weekly - 3 sets - 10 reps   Access Code: [email or web address]  URL: [email or web address]  Date: 10/05/2023  Prepared by: Sedalia Custard  Exercises  - Standing shoulder flexion wall slides  -  1 x daily - 7 x weekly - 2 sets - 10 reps - 5 hold  - Standing Shoulder Abduction Slides at Wall  - 1 x daily - 7 x weekly - 2 sets - 10 reps - 5 hold  - Standing Isometric Shoulder Flexion with Doorway - Arm Bent  - 1 x daily - 7 x weekly - 2 sets - 10 reps - 5 hold  - Standing Isometric Shoulder Abduction with Doorway - Arm Bent  - 1 x daily - 7 x weekly - 2 sets - 10 reps - 5 hold  - Standing Isometric Shoulder Internal Rotation at Doorway  - 1 x daily - 7 x weekly - 2 sets - 10 reps - 5 hold  - Standing Isometric Shoulder Extension with Doorway - Arm Bent  - 1 x daily - 7 x weekly - 2 sets - 10 reps - 5 hold  - Seated Shoulder Pendulum Exercise  - 1 x  daily - 7 x weekly - 2 sets - 10 reps - 5 hold   ASSESSMENT:  CLINICAL IMPRESSION:    ***        OBJECTIVE IMPAIRMENTS: Abnormal gait, decreased activity tolerance, decreased endurance, decreased mobility, difficulty walking, decreased ROM, decreased strength, hypomobility, increased edema, impaired perceived functional ability, impaired flexibility, impaired UE functional use, improper body mechanics, postural dysfunction, and pain.   ACTIVITY LIMITATIONS: carrying, lifting, bending, sitting, standing, squatting, stairs, transfers, bed mobility, bathing, dressing, self feeding, reach over head, hygiene/grooming, locomotion level, and caring for others  PARTICIPATION LIMITATIONS: meal prep, cleaning, laundry, driving, shopping, community activity, occupation, and yard work  PERSONAL FACTORS: Age, Fitness, Profession, and 3+ comorbidities: anemia, arthritis (back, L elbow, L hip, L ankle), asthma, DM, GERD, headache, hyperparathyroidism, lymphedema, seizure, sleep apnea are also affecting patient's functional outcome.   REHAB POTENTIAL: Good  CLINICAL DECISION MAKING: Evolving/moderate complexity  EVALUATION COMPLEXITY: Moderate   GOALS: Goals reviewed with patient? Yes  SHORT TERM GOALS: Target date: 10/24/2023  Patient will be independent in home exercise program to improve strength/mobility for better functional independence with ADLs. Baseline:See HEP above; 11/17/2023=Patient able to verbalize and today demonstrate good understanding of HEP especially now since she has received the results from MRI.  Goal status: MET   LONG TERM GOALS: Target date: 11/28/2023  Patient will decrease Quick DASH score by > 8 points demonstrating reduced self-reported upper extremity disability. Baseline: 68.2/100 01/10/24:  Goal status: INITIAL  2.  Patient will improve L shoulder AROM to > 140 degrees of flexion, scaption, and abduction for improved ability to perform overhead  activities. Baseline: avoided AROM due to surgical procedure 01/10/24: avoided AROM due to surgical procedure Goal status: INITIAL  3.  Patient will report a worst pain of 6/10 on VAS in L shoulder to improve tolerance with ADLs and reduced symptoms with activities. Baseline: 10/10 at worst 01/10/24: 3/10 at worst Goal status: MET  4.  Patient will be able to tolerate holding her young grandson without an increase in L shoulder pain to show improved L UE function and increase pt's ability to participate in family care. Baseline: Pt is unable to pick up grandson due to precautions of the L shoulder following RTC repair 01/10/24: Unable to do so at this time due to lifting restrictions and AROM restrictions Goal status: ONGOING   PLAN:  PT FREQUENCY: 2x/week  PT DURATION: 8 weeks  PLANNED INTERVENTIONS: 97164- PT Re-evaluation, 97750- Physical Performance Testing, 97110-Therapeutic exercises, 97530- Therapeutic activity, [id number]- Neuromuscular re-education,  02464- Self Care, 02859- Manual therapy, [id number]- Gait training, [id number]- Orthotic Initial, [id number]- Orthotic/Prosthetic subsequent, [phone]- Canalith repositioning, [id number]- Splinting, [phone]- Electrical stimulation (manual), [id number]- Ultrasound, [id number]- Traction (mechanical), [id number]- Parrafin, [id number] (1-2 muscles), [id number] (3+ muscles)- Dry Needling, Patient/Family education, Balance training, Stair training, Taping, Joint mobilization, Spinal mobilization, Scar mobilization, Vestibular training, DME instructions, Cryotherapy, and Moist heat   PLAN FOR NEXT SESSION:   Assess isometric exercises   Fonda Simpers, PT, DPT Physical Therapist - [facility]  [facility]  01/23/24, 7:27 AM

## 2024-01-25 ENCOUNTER — Other Ambulatory Visit: Payer: Self-pay

## 2024-01-25 ENCOUNTER — Ambulatory Visit: Attending: Orthopedic Surgery

## 2024-01-25 DIAGNOSIS — M6281 Muscle weakness (generalized): Secondary | ICD-10-CM | POA: Insufficient documentation

## 2024-01-25 DIAGNOSIS — M25562 Pain in left knee: Secondary | ICD-10-CM | POA: Insufficient documentation

## 2024-01-25 DIAGNOSIS — M25512 Pain in left shoulder: Secondary | ICD-10-CM | POA: Insufficient documentation

## 2024-01-25 DIAGNOSIS — M25612 Stiffness of left shoulder, not elsewhere classified: Secondary | ICD-10-CM | POA: Diagnosis present

## 2024-01-25 MED ORDER — TIRZEPATIDE 15 MG/0.5ML ~~LOC~~ SOAJ
15.0000 mg | SUBCUTANEOUS | 3 refills | Status: DC
  Filled 2024-01-25: qty 6, 84d supply, fill #0

## 2024-01-25 NOTE — Therapy (Signed)
 OUTPATIENT PHYSICAL THERAPY L SHOULDER TREATMENT   Patient Name: Ashley [name] MRN: [id number] DOB:1965-07-09, 58 y.o., female Today's Date: 01/25/2024  END OF SESSION:   PT End of Session - 01/25/24 0811     Visit Number 13    Number of Visits 24    Date for Recertification  03/04/24    PT Start Time 0811    PT Stop Time 0845    PT Time Calculation (min) 34 min    Activity Tolerance Patient tolerated treatment well;Patient limited by pain    Behavior During Therapy [facility] for tasks assessed/performed          Past Medical History:  Diagnosis Date   Allergy    Anemia    Arthritis    back, left elbow, left hip, left ankle    Asthma    Blood transfusion without reported diagnosis    Complication of anesthesia    delayed emergence   Diabetes mellitus without complication (HCC)    diet controlled   Diverticulosis    GERD (gastroesophageal reflux disease)    Headache    History of hiatal hernia    History of kidney stones    Hyperparathyroidism    Lymphedema    PONV (postoperative nausea and vomiting)    S/P subtotal parathyroidectomy    Seizure (HCC)    as a child   Sleep apnea    cannot tolerate mask   Thyroid  nodule 06/20/2018   Varicose vein of leg    Past Surgical History:  Procedure Laterality Date   ACETABULUM FRACTURE SURGERY Left    CESAREAN SECTION     CHOLECYSTECTOMY     COLONOSCOPY WITH PROPOFOL  N/A 05/31/2021   Procedure: COLONOSCOPY WITH PROPOFOL ;  Surgeon: Maryruth Ole DASEN, MD;  Location: [facility] ENDOSCOPY;  Service: Endoscopy;  Laterality: N/A;   ELBOW FRACTURE SURGERY  1995   ESOPHAGOGASTRODUODENOSCOPY (EGD) WITH PROPOFOL  N/A 05/31/2021   Procedure: ESOPHAGOGASTRODUODENOSCOPY (EGD) WITH PROPOFOL ;  Surgeon: Maryruth Ole DASEN, MD;  Location: [facility] ENDOSCOPY;  Service: Endoscopy;  Laterality: N/A;   FRACTURE SURGERY     left ankle complete repair, left elbow   HERNIA REPAIR     incisional   LAPAROSCOPIC GASTRIC SLEEVE RESECTION   2014   PARATHYROIDECTOMY N/A 10/25/2018   Procedure: PARATHYROIDECTOMY, DIABETIC, SLEEP APNEA;  Surgeon: Marolyn Nest, MD;  Location: [facility] ORS;  Service: General;  Laterality: N/A;   TONSILLECTOMY AND ADENOIDECTOMY N/A 12/08/2015   Procedure: TONSILLECTOMY AND ADENOIDECTOMY;  Surgeon: Chinita Hasten, MD;  Location: [facility] ORS;  Service: ENT;  Laterality: N/A;   TOTAL HIP ARTHROPLASTY Left 09/22/2022   Procedure: Left posterior total hip arthroplasty;  Surgeon: Lorelle Hussar, MD;  Location: [facility] ORS;  Service: Orthopedics;  Laterality: Left;   TUBAL LIGATION     Patient Active Problem List   Diagnosis Date Noted   Osteoarthritis of left hip 09/22/2022   Side pain 08/16/2022   Musculoskeletal pain 08/16/2022   Varicose veins of both legs with edema 01/05/2021   Swelling of limb 12/30/2020   Diabetes (HCC) 12/30/2020   Lymphedema 12/30/2020   S/P subtotal parathyroidectomy 11/08/2018   Hyperparathyroidism 06/20/2018   Thyroid  nodule 06/20/2018   Intractable pain 04/12/2015    PCP:  Ophelia Sage, MD  REFERRING PROVIDER: Norleen Gavel, MD  REFERRING DIAG:  [phone] (ICD-10-CM) - Left shoulder pain  M25.562 (ICD-10-CM) - Left knee pain    THERAPY DIAG:  Acute pain of left shoulder  Decreased ROM of left shoulder  Muscle weakness (generalized)  Acute  pain of left knee  Rationale for Evaluation and Treatment: Rehabilitation  ONSET DATE: 08/30/23  SUBJECTIVE:                                                                                                                                                                                      SUBJECTIVE STATEMENT:  Pt notes expected soreness and that the weather is causing some of her issues at the moment.  Pt otherwise doing well.    Hand dominance: Right  PERTINENT HISTORY: Pt was seen at [facility] ED on 08/30/23 after a fall sustained at work. Pt works in the emergency room and reported that she slipped on a wet spot and fell onto  her left shoulder and knee to avoid falling onto her L hip. Per ED note: Physical exam is generally reassuring. Shoulder joint itself has normal mobility and I am not concerned about dislocation. No evidence of clavicular injury. Mild proximal humerus fracture is possible and the patient is concerned about the fact that she has had multiple prior orthopedic injuries/fractures due to relatively minor falls. We will proceed with x-rays and anticipate restricted movement with shoulder sling, RICE, etc. Pt underwent L THA last June and has previous history of elbow surgery that prevents full movement of L wrist. Pt reports that she wore the sling provided in ED for a couple of weeks after the fall but has not worn it since.  PMH includes anemia, arthritis (back, L elbow, L hip, L ankle), asthma, DM, GERD, headache, hyperparathyroidism, lymphedema, seizure, sleep apnea  PAIN: Reported on L shoulder: Are you having pain? Yes: NPRS scale: 4/10 currently, worst 10/10 with movement, best: 4/10  Pain location: anterior shoulder Pain description: Sharp when it hits. Aggravating factors: reaching overhead/behind head, movement Relieving factors: Voltaren, Biofreeze, lidocaine  patches, rest  PRECAUTIONS: None  RED FLAGS: None   WEIGHT BEARING RESTRICTIONS: No  FALLS:  Has patient fallen in last 6 months? Yes. Number of falls 1- pt reports that this incident is her only fall  LIVING ENVIRONMENT: Lives with: lives with their spouse Lives in: Mobile home Stairs: Yes: External: 4 steps; can reach both Has following equipment at home: Single point cane and Walker - 2 wheeled  OCCUPATION: Works in ED at [employer]- pt reports that the pain hasn't affected her job duties that much because she is R handed  PLOF: Independent  PATIENT GOALS: Pt wants to be able to hold her 47 month old grandson, get range of motion back to use her L arm normally.  NEXT MD VISIT: 10/12/23  OBJECTIVE:  Note: Objective measures  were completed at Evaluation unless otherwise noted.  DIAGNOSTIC FINDINGS:  DG Humerus L (08/30/23): FINDINGS: There is no evidence of an acute fracture or dislocation. Radiopaque surgical screws and fixation wires are seen within the left olecranon process and left radial head. Soft tissues are unremarkable.   IMPRESSION: 1. No acute osseous abnormality. 2. Postoperative changes of the left elbow.   PATIENT SURVEYS:   QuickDASH Score: 68.2 / 100 = 68.2 %  COGNITION: Overall cognitive status: Within functional limits for tasks assessed     SENSATION: WFL- bilateral UE and LE  POSTURE: Pt holds L UE in guarded position at rest. L shoulder in sling upon arrival, but doffs with ease.   UPPER EXTREMITY ROM:   Active ROM Right eval Left PROM eval  Shoulder flexion 155 90  Shoulder extension    Shoulder abduction 120 90  Shoulder adduction    Shoulder internal rotation 75 NT  Shoulder external rotation 50 NT  Elbow flexion    Elbow extension    Wrist flexion    Wrist extension    Wrist ulnar deviation    Wrist radial deviation    Wrist pronation    Wrist supination    (Blank rows = not tested)   UPPER EXTREMITY MMT:  MMT Right eval Left eval  Shoulder flexion 4 NT  Shoulder extension    Shoulder abduction 3+ NT  Shoulder adduction    Shoulder internal rotation    Shoulder external rotation    Middle trapezius    Lower trapezius    Elbow flexion 4 NT  Elbow extension 4 NT  Wrist flexion    Wrist extension    Wrist ulnar deviation    Wrist radial deviation    Wrist pronation    Wrist supination    Grip strength (lbs)    (Blank rows = not tested)    PALPATION:  Pt with expected tenderness around the incision sites and still covered with the bandaging.                                                                                                                               TREATMENT DATE: 01/25/24  TherEx:  Supine PROM of the L shoulder into  flexion, abduction, IR, and ER within pain tolerable ranges.  Pt with soft end feels throughout the process and was advised to let the therapist know when feeling discomfort.  Supine AAROM chest press with PVC pipe, 2x10            Verbal cues for the R UE doing majority of the work   Supine AAROM shoulder flexion with PVC pipe, 2x10            Verbal cuing for thigh to ~90 deg of forward flexion before pain sets in  Standing isometric shoulder flexion with physioball, 2x10 Standing isometric shoulder abduction with physioball, 2x10 Standing isometric shoulder extension with physioball, 2x10 Standing isometric shoulder adduction with physioball, 2x10  Standing AAROM L shoulder flexion with use of  the UE Ranger, 2x10  Standing AAROM L shoulder abduction/adduction with the use of UE, 2x10 (washing window motion side-to-side)  Standing AAROM of L shoulder into circular patterns (CW/CCW like washing a table) with use of UE Ranger on floor, 2x10 each direction     PATIENT EDUCATION: Education details: Exercise technique Person educated: Patient Education method: Explanation Education comprehension: verbalized understanding  HOME EXERCISE PROGRAM: Access Code: [email or web address] URL: [email or web address] Date: 12/11/2023 Prepared by: Sidra Simpers  Exercises - Circular Shoulder Pendulum with Table Support  - 1 x daily - 7 x weekly - 3 sets - 10 reps - Flexion-Extension Shoulder Pendulum with Table Support  - 1 x daily - 7 x weekly - 3 sets - 10 reps - Horizontal Shoulder Pendulum with Table Support  - 1 x daily - 7 x weekly - 3 sets - 10 reps - Supine Scapular Retraction  - 1 x daily - 7 x weekly - 3 sets - 10 reps - Seated Shoulder Shrug Circles AROM Backward  - 1 x daily - 7 x weekly - 3 sets - 10 reps - Wrist AROM Flexion Extension  - 1 x daily - 7 x weekly - 3 sets - 10 reps - Elbow Flexion PROM  - 1 x daily - 7 x weekly - 3 sets - 10 reps - Elbow Extension PROM  - 1 x  daily - 7 x weekly - 3 sets - 10 reps - Forearm Pronation PROM  - 1 x daily - 7 x weekly - 3 sets - 10 reps - Forearm Supination PROM  - 1 x daily - 7 x weekly - 3 sets - 10 reps   Access Code: [email or web address]  URL: [email or web address]  Date: 10/05/2023  Prepared by: Marina  Moser  Exercises  - Standing shoulder flexion wall slides  - 1 x daily - 7 x weekly - 2 sets - 10 reps - 5 hold  - Standing Shoulder Abduction Slides at Wall  - 1 x daily - 7 x weekly - 2 sets - 10 reps - 5 hold  - Standing Isometric Shoulder Flexion with Doorway - Arm Bent  - 1 x daily - 7 x weekly - 2 sets - 10 reps - 5 hold  - Standing Isometric Shoulder Abduction with Doorway - Arm Bent  - 1 x daily - 7 x weekly - 2 sets - 10 reps - 5 hold  - Standing Isometric Shoulder Internal Rotation at Doorway  - 1 x daily - 7 x weekly - 2 sets - 10 reps - 5 hold  - Standing Isometric Shoulder Extension with Doorway - Arm Bent  - 1 x daily - 7 x weekly - 2 sets - 10 reps - 5 hold  - Seated Shoulder Pendulum Exercise  - 1 x daily - 7 x weekly - 2 sets - 10 reps - 5 hold   ASSESSMENT:  CLINICAL IMPRESSION:    Pt arrived late to session per usual.  Pt participated in the exercises without any complaints or increased pain.  Pt continues to have good ROM and is working on exercises at home.  Pt encouraged to continue with HEP and improve overall tolerance to resistance training during the future sessions.   Pt will continue to benefit from skilled therapy to address remaining deficits in order to improve overall QoL and return to PLOF.        OBJECTIVE IMPAIRMENTS: Abnormal gait, decreased activity tolerance, decreased endurance, decreased mobility, difficulty walking, decreased ROM, decreased strength,  hypomobility, increased edema, impaired perceived functional ability, impaired flexibility, impaired UE functional use, improper body mechanics, postural dysfunction, and pain.   ACTIVITY LIMITATIONS: carrying, lifting,  bending, sitting, standing, squatting, stairs, transfers, bed mobility, bathing, dressing, self feeding, reach over head, hygiene/grooming, locomotion level, and caring for others  PARTICIPATION LIMITATIONS: meal prep, cleaning, laundry, driving, shopping, community activity, occupation, and yard work  PERSONAL FACTORS: Age, Fitness, Profession, and 3+ comorbidities: anemia, arthritis (back, L elbow, L hip, L ankle), asthma, DM, GERD, headache, hyperparathyroidism, lymphedema, seizure, sleep apnea are also affecting patient's functional outcome.   REHAB POTENTIAL: Good  CLINICAL DECISION MAKING: Evolving/moderate complexity  EVALUATION COMPLEXITY: Moderate   GOALS: Goals reviewed with patient? Yes  SHORT TERM GOALS: Target date: 10/24/2023  Patient will be independent in home exercise program to improve strength/mobility for better functional independence with ADLs. Baseline:See HEP above; 11/17/2023=Patient able to verbalize and today demonstrate good understanding of HEP especially now since she has received the results from MRI.  Goal status: MET   LONG TERM GOALS: Target date: 11/28/2023  Patient will decrease Quick DASH score by > 8 points demonstrating reduced self-reported upper extremity disability. Baseline: 68.2/100 01/10/24:  Goal status: INITIAL  2.  Patient will improve L shoulder AROM to > 140 degrees of flexion, scaption, and abduction for improved ability to perform overhead activities. Baseline: avoided AROM due to surgical procedure 01/10/24: avoided AROM due to surgical procedure Goal status: INITIAL  3.  Patient will report a worst pain of 6/10 on VAS in L shoulder to improve tolerance with ADLs and reduced symptoms with activities. Baseline: 10/10 at worst 01/10/24: 3/10 at worst Goal status: MET  4.  Patient will be able to tolerate holding her young grandson without an increase in L shoulder pain to show improved L UE function and increase pt's ability to  participate in family care. Baseline: Pt is unable to pick up grandson due to precautions of the L shoulder following RTC repair 01/10/24: Unable to do so at this time due to lifting restrictions and AROM restrictions Goal status: ONGOING   PLAN:  PT FREQUENCY: 2x/week  PT DURATION: 8 weeks  PLANNED INTERVENTIONS: [id number]- PT Re-evaluation, 97750- Physical Performance Testing, 97110-Therapeutic exercises, 97530- Therapeutic activity, [id number]- Neuromuscular re-education, 97535- Self Care, [id number]- Manual therapy, [id number]- Gait training, [id number]- Orthotic Initial, [id number]- Orthotic/Prosthetic subsequent, [phone]- Canalith repositioning, [id number]- Splinting, [id number]- Electrical stimulation (manual), [id number]- Ultrasound, [id number]- Traction (mechanical), [id number]- Parrafin, 20560 (1-2 muscles), 20561 (3+ muscles)- Dry Needling, Patient/Family education, Balance training, Stair training, Taping, Joint mobilization, Spinal mobilization, Scar mobilization, Vestibular training, DME instructions, Cryotherapy, and Moist heat   PLAN FOR NEXT SESSION:   Isometric exercises   Fonda Simpers, PT, DPT Physical Therapist - [facility]  [facility]  01/25/24, 8:44 AM

## 2024-01-30 ENCOUNTER — Ambulatory Visit: Attending: Orthopedic Surgery

## 2024-01-30 DIAGNOSIS — M25512 Pain in left shoulder: Secondary | ICD-10-CM | POA: Diagnosis not present

## 2024-01-30 DIAGNOSIS — M25612 Stiffness of left shoulder, not elsewhere classified: Secondary | ICD-10-CM | POA: Insufficient documentation

## 2024-01-30 DIAGNOSIS — M6281 Muscle weakness (generalized): Secondary | ICD-10-CM | POA: Diagnosis not present

## 2024-01-30 DIAGNOSIS — M25562 Pain in left knee: Secondary | ICD-10-CM | POA: Insufficient documentation

## 2024-01-30 NOTE — Therapy (Signed)
 OUTPATIENT PHYSICAL THERAPY L SHOULDER TREATMENT   Patient Name: Ashley [name] MRN: [id number] DOB:Dec 04, 1965, 58 y.o., female Today's Date: 01/30/2024  END OF SESSION:   PT End of Session - 01/30/24 0722     Visit Number 14    Number of Visits 24    Date for Recertification  03/04/24    PT Start Time 0718    PT Stop Time 0800    PT Time Calculation (min) 42 min    Activity Tolerance Patient tolerated treatment well;Patient limited by pain    Behavior During Therapy [facility] for tasks assessed/performed          Past Medical History:  Diagnosis Date   Allergy    Anemia    Arthritis    back, left elbow, left hip, left ankle    Asthma    Blood transfusion without reported diagnosis    Complication of anesthesia    delayed emergence   Diabetes mellitus without complication (HCC)    diet controlled   Diverticulosis    GERD (gastroesophageal reflux disease)    Headache    History of hiatal hernia    History of kidney stones    Hyperparathyroidism    Lymphedema    PONV (postoperative nausea and vomiting)    S/P subtotal parathyroidectomy    Seizure (HCC)    as a child   Sleep apnea    cannot tolerate mask   Thyroid  nodule 06/20/2018   Varicose vein of leg    Past Surgical History:  Procedure Laterality Date   ACETABULUM FRACTURE SURGERY Left    CESAREAN SECTION     CHOLECYSTECTOMY     COLONOSCOPY WITH PROPOFOL  N/A 05/31/2021   Procedure: COLONOSCOPY WITH PROPOFOL ;  Surgeon: Maryruth Ole DASEN, MD;  Location: [facility] ENDOSCOPY;  Service: Endoscopy;  Laterality: N/A;   ELBOW FRACTURE SURGERY  1995   ESOPHAGOGASTRODUODENOSCOPY (EGD) WITH PROPOFOL  N/A 05/31/2021   Procedure: ESOPHAGOGASTRODUODENOSCOPY (EGD) WITH PROPOFOL ;  Surgeon: Maryruth Ole DASEN, MD;  Location: [facility] ENDOSCOPY;  Service: Endoscopy;  Laterality: N/A;   FRACTURE SURGERY     left ankle complete repair, left elbow   HERNIA REPAIR     incisional   LAPAROSCOPIC GASTRIC SLEEVE RESECTION   2014   PARATHYROIDECTOMY N/A 10/25/2018   Procedure: PARATHYROIDECTOMY, DIABETIC, SLEEP APNEA;  Surgeon: Marolyn Nest, MD;  Location: [facility] ORS;  Service: General;  Laterality: N/A;   TONSILLECTOMY AND ADENOIDECTOMY N/A 12/08/2015   Procedure: TONSILLECTOMY AND ADENOIDECTOMY;  Surgeon: Chinita Hasten, MD;  Location: [facility] ORS;  Service: ENT;  Laterality: N/A;   TOTAL HIP ARTHROPLASTY Left 09/22/2022   Procedure: Left posterior total hip arthroplasty;  Surgeon: Lorelle Hussar, MD;  Location: [facility] ORS;  Service: Orthopedics;  Laterality: Left;   TUBAL LIGATION     Patient Active Problem List   Diagnosis Date Noted   Osteoarthritis of left hip 09/22/2022   Side pain 08/16/2022   Musculoskeletal pain 08/16/2022   Varicose veins of both legs with edema 01/05/2021   Swelling of limb 12/30/2020   Diabetes (HCC) 12/30/2020   Lymphedema 12/30/2020   S/P subtotal parathyroidectomy 11/08/2018   Hyperparathyroidism 06/20/2018   Thyroid  nodule 06/20/2018   Intractable pain 04/12/2015    PCP:  Ophelia Sage, MD  REFERRING PROVIDER: Norleen Gavel, MD  REFERRING DIAG:  [phone] (ICD-10-CM) - Left shoulder pain  M25.562 (ICD-10-CM) - Left knee pain    THERAPY DIAG:  No diagnosis found.  Rationale for Evaluation and Treatment: Rehabilitation  ONSET DATE: 08/30/23  SUBJECTIVE:  SUBJECTIVE STATEMENT:  Pt reports that it was cold in the ED last night and the cold makes the shoulder more stiff.    Hand dominance: Right  PERTINENT HISTORY: Pt was seen at [facility] ED on 08/30/23 after a fall sustained at work. Pt works in the emergency room and reported that she slipped on a wet spot and fell onto her left shoulder and knee to avoid falling onto her L hip. Per ED note: Physical exam is generally reassuring. Shoulder joint  itself has normal mobility and I am not concerned about dislocation. No evidence of clavicular injury. Mild proximal humerus fracture is possible and the patient is concerned about the fact that she has had multiple prior orthopedic injuries/fractures due to relatively minor falls. We will proceed with x-rays and anticipate restricted movement with shoulder sling, RICE, etc. Pt underwent L THA last June and has previous history of elbow surgery that prevents full movement of L wrist. Pt reports that she wore the sling provided in ED for a couple of weeks after the fall but has not worn it since.  PMH includes anemia, arthritis (back, L elbow, L hip, L ankle), asthma, DM, GERD, headache, hyperparathyroidism, lymphedema, seizure, sleep apnea  PAIN: Reported on L shoulder: Are you having pain? Yes: NPRS scale: 4/10 currently, worst 10/10 with movement, best: 4/10  Pain location: anterior shoulder Pain description: Sharp when it hits. Aggravating factors: reaching overhead/behind head, movement Relieving factors: Voltaren, Biofreeze, lidocaine  patches, rest  PRECAUTIONS: None  RED FLAGS: None   WEIGHT BEARING RESTRICTIONS: No  FALLS:  Has patient fallen in last 6 months? Yes. Number of falls 1- pt reports that this incident is her only fall  LIVING ENVIRONMENT: Lives with: lives with their spouse Lives in: Mobile home Stairs: Yes: External: 4 steps; can reach both Has following equipment at home: Single point cane and Walker - 2 wheeled  OCCUPATION: Works in ED at [employer]- pt reports that the pain hasn't affected her job duties that much because she is R handed  PLOF: Independent  PATIENT GOALS: Pt wants to be able to hold her 21 month old grandson, get range of motion back to use her L arm normally.  NEXT MD VISIT: 10/12/23  OBJECTIVE:  Note: Objective measures were completed at Evaluation unless otherwise noted.  DIAGNOSTIC FINDINGS:  DG Humerus L (08/30/23): FINDINGS: There is no  evidence of an acute fracture or dislocation. Radiopaque surgical screws and fixation wires are seen within the left olecranon process and left radial head. Soft tissues are unremarkable.   IMPRESSION: 1. No acute osseous abnormality. 2. Postoperative changes of the left elbow.   PATIENT SURVEYS:   QuickDASH Score: 68.2 / 100 = 68.2 %  COGNITION: Overall cognitive status: Within functional limits for tasks assessed     SENSATION: WFL- bilateral UE and LE  POSTURE: Pt holds L UE in guarded position at rest. L shoulder in sling upon arrival, but doffs with ease.   UPPER EXTREMITY ROM:   Active ROM Right eval Left PROM eval  Shoulder flexion 155 90  Shoulder extension    Shoulder abduction 120 90  Shoulder adduction    Shoulder internal rotation 75 NT  Shoulder external rotation 50 NT  Elbow flexion    Elbow extension    Wrist flexion    Wrist extension    Wrist ulnar deviation    Wrist radial deviation    Wrist pronation    Wrist supination    (Blank rows = not  tested)   UPPER EXTREMITY MMT:  MMT Right eval Left eval  Shoulder flexion 4 NT  Shoulder extension    Shoulder abduction 3+ NT  Shoulder adduction    Shoulder internal rotation    Shoulder external rotation    Middle trapezius    Lower trapezius    Elbow flexion 4 NT  Elbow extension 4 NT  Wrist flexion    Wrist extension    Wrist ulnar deviation    Wrist radial deviation    Wrist pronation    Wrist supination    Grip strength (lbs)    (Blank rows = not tested)    PALPATION:  Pt with expected tenderness around the incision sites and still covered with the bandaging.                                                                                                                               TREATMENT DATE: 01/30/24   TherEx:  Seated pulleys into flexion/abduction, 2 min each direction  Standing shoulder ladder climbs into shoulder flexion/scaption, able to reach #27 in flexion,  #24 in scaption, x10 each direction  Standing isometric shoulder flexion with physioball, 2x10 Standing isometric shoulder abduction with physioball, 2x10 Standing isometric shoulder extension with physioball, 2x10 Standing isometric shoulder adduction with physioball, 2x10  Standing AAROM L shoulder circles with the use of UE Ranger, 2x10 (wax on/wax off)  Standing AAROM L shoulder flexion with use of the UE Ranger, 2x10  Standing AAROM L shoulder abduction/adduction with the use of UE Ranger, 2x10 (washing window motion side-to-side)   Standing AAROM shoulder flexion with PVC pipe, 2x10 Standing AAROM shoulder flexion/ER with PVC pipe, 2x10  Supine PROM of the L shoulder into flexion, abduction, IR, and ER within pain tolerable ranges.  Pt with soft end feels throughout the process and was advised to let the therapist know when feeling discomfort.     PATIENT EDUCATION: Education details: Exercise technique Person educated: Patient Education method: Explanation Education comprehension: verbalized understanding  HOME EXERCISE PROGRAM: Access Code: [email or web address] URL: [email or web address] Date: 12/11/2023 Prepared by: Sidra Simpers  Exercises - Circular Shoulder Pendulum with Table Support  - 1 x daily - 7 x weekly - 3 sets - 10 reps - Flexion-Extension Shoulder Pendulum with Table Support  - 1 x daily - 7 x weekly - 3 sets - 10 reps - Horizontal Shoulder Pendulum with Table Support  - 1 x daily - 7 x weekly - 3 sets - 10 reps - Supine Scapular Retraction  - 1 x daily - 7 x weekly - 3 sets - 10 reps - Seated Shoulder Shrug Circles AROM Backward  - 1 x daily - 7 x weekly - 3 sets - 10 reps - Wrist AROM Flexion Extension  - 1 x daily - 7 x weekly - 3 sets - 10 reps - Elbow Flexion PROM  - 1 x daily - 7 x weekly - 3 sets -  10 reps - Elbow Extension PROM  - 1 x daily - 7 x weekly - 3 sets - 10 reps - Forearm Pronation PROM  - 1 x daily - 7 x weekly - 3 sets - 10 reps -  Forearm Supination PROM  - 1 x daily - 7 x weekly - 3 sets - 10 reps   Access Code: [email or web address]  URL: [email or web address]  Date: 10/05/2023  Prepared by: Marina  Moser  Exercises  - Standing shoulder flexion wall slides  - 1 x daily - 7 x weekly - 2 sets - 10 reps - 5 hold  - Standing Shoulder Abduction Slides at Wall  - 1 x daily - 7 x weekly - 2 sets - 10 reps - 5 hold  - Standing Isometric Shoulder Flexion with Doorway - Arm Bent  - 1 x daily - 7 x weekly - 2 sets - 10 reps - 5 hold  - Standing Isometric Shoulder Abduction with Doorway - Arm Bent  - 1 x daily - 7 x weekly - 2 sets - 10 reps - 5 hold  - Standing Isometric Shoulder Internal Rotation at Doorway  - 1 x daily - 7 x weekly - 2 sets - 10 reps - 5 hold  - Standing Isometric Shoulder Extension with Doorway - Arm Bent  - 1 x daily - 7 x weekly - 2 sets - 10 reps - 5 hold  - Seated Shoulder Pendulum Exercise  - 1 x daily - 7 x weekly - 2 sets - 10 reps - 5 hold   ASSESSMENT:  CLINICAL IMPRESSION:    Pt responded well to the increased resistance by having the pt perform some of the exercises against gravity.  Pt still requires verbal cues for proper performance of the exercises and to improve the tolerance to the exercises as well.  Pt noted to have some increased difficulty with performing the isometric exercises with a longer hold time, with pt wanting to perform short bursts instead of actually engaging the muscles for longer term.   Pt will continue to benefit from skilled therapy to address remaining deficits in order to improve overall QoL and return to PLOF.         OBJECTIVE IMPAIRMENTS: Abnormal gait, decreased activity tolerance, decreased endurance, decreased mobility, difficulty walking, decreased ROM, decreased strength, hypomobility, increased edema, impaired perceived functional ability, impaired flexibility, impaired UE functional use, improper body mechanics, postural dysfunction, and pain.   ACTIVITY  LIMITATIONS: carrying, lifting, bending, sitting, standing, squatting, stairs, transfers, bed mobility, bathing, dressing, self feeding, reach over head, hygiene/grooming, locomotion level, and caring for others  PARTICIPATION LIMITATIONS: meal prep, cleaning, laundry, driving, shopping, community activity, occupation, and yard work  PERSONAL FACTORS: Age, Fitness, Profession, and 3+ comorbidities: anemia, arthritis (back, L elbow, L hip, L ankle), asthma, DM, GERD, headache, hyperparathyroidism, lymphedema, seizure, sleep apnea are also affecting patient's functional outcome.   REHAB POTENTIAL: Good  CLINICAL DECISION MAKING: Evolving/moderate complexity  EVALUATION COMPLEXITY: Moderate   GOALS: Goals reviewed with patient? Yes  SHORT TERM GOALS: Target date: 10/24/2023  Patient will be independent in home exercise program to improve strength/mobility for better functional independence with ADLs. Baseline:See HEP above; 11/17/2023=Patient able to verbalize and today demonstrate good understanding of HEP especially now since she has received the results from MRI.  Goal status: MET   LONG TERM GOALS: Target date: 11/28/2023  Patient will decrease Quick DASH score by > 8 points demonstrating reduced self-reported upper extremity disability. Baseline: 68.2/100 01/10/24:  Goal status: INITIAL  2.  Patient will improve L shoulder AROM to > 140 degrees of flexion, scaption, and abduction for improved ability to perform overhead activities. Baseline: avoided AROM due to surgical procedure 01/10/24: avoided AROM due to surgical procedure Goal status: INITIAL  3.  Patient will report a worst pain of 6/10 on VAS in L shoulder to improve tolerance with ADLs and reduced symptoms with activities. Baseline: 10/10 at worst 01/10/24: 3/10 at worst Goal status: MET  4.  Patient will be able to tolerate holding her young grandson without an increase in L shoulder pain to show improved L UE function  and increase pt's ability to participate in family care. Baseline: Pt is unable to pick up grandson due to precautions of the L shoulder following RTC repair 01/10/24: Unable to do so at this time due to lifting restrictions and AROM restrictions Goal status: ONGOING   PLAN:  PT FREQUENCY: 2x/week  PT DURATION: 8 weeks  PLANNED INTERVENTIONS: 97164- PT Re-evaluation, 97750- Physical Performance Testing, 97110-Therapeutic exercises, 97530- Therapeutic activity, 97112- Neuromuscular re-education, 97535- Self Care, [id number]- Manual therapy, [id number]- Gait training, [id number]- Orthotic Initial, [id number]- Orthotic/Prosthetic subsequent, [phone]- Canalith repositioning, [id number]- Splinting, [id number]- Electrical stimulation (manual), [id number]- Ultrasound, [id number]- Traction (mechanical), [id number]- Parrafin, 20560 (1-2 muscles), 20561 (3+ muscles)- Dry Needling, Patient/Family education, Balance training, Stair training, Taping, Joint mobilization, Spinal mobilization, Scar mobilization, Vestibular training, DME instructions, Cryotherapy, and Moist heat   PLAN FOR NEXT SESSION:   Isometric exercises Continue with strengthening within pain tolerable range   Fonda Simpers, PT, DPT Physical Therapist - [facility]  01/30/24, 7:22 AM

## 2024-02-01 ENCOUNTER — Ambulatory Visit

## 2024-02-01 DIAGNOSIS — M25512 Pain in left shoulder: Secondary | ICD-10-CM

## 2024-02-01 DIAGNOSIS — M25612 Stiffness of left shoulder, not elsewhere classified: Secondary | ICD-10-CM | POA: Diagnosis not present

## 2024-02-01 DIAGNOSIS — M25562 Pain in left knee: Secondary | ICD-10-CM

## 2024-02-01 DIAGNOSIS — M6281 Muscle weakness (generalized): Secondary | ICD-10-CM

## 2024-02-01 NOTE — Therapy (Signed)
 OUTPATIENT PHYSICAL THERAPY L SHOULDER TREATMENT   Patient Name: Ashley [name] MRN: [id number] DOB:1965/09/06, 58 y.o., female Today's Date: 02/01/2024  END OF SESSION:   PT End of Session - 02/01/24 0724     Visit Number 15    Number of Visits 24    Date for Recertification  03/04/24    PT Start Time 0722    PT Stop Time 0801    PT Time Calculation (min) 39 min    Activity Tolerance Patient tolerated treatment well;Patient limited by pain    Behavior During Therapy [facility] for tasks assessed/performed          Past Medical History:  Diagnosis Date   Allergy    Anemia    Arthritis    back, left elbow, left hip, left ankle    Asthma    Blood transfusion without reported diagnosis    Complication of anesthesia    delayed emergence   Diabetes mellitus without complication (HCC)    diet controlled   Diverticulosis    GERD (gastroesophageal reflux disease)    Headache    History of hiatal hernia    History of kidney stones    Hyperparathyroidism    Lymphedema    PONV (postoperative nausea and vomiting)    S/P subtotal parathyroidectomy    Seizure (HCC)    as a child   Sleep apnea    cannot tolerate mask   Thyroid  nodule 06/20/2018   Varicose vein of leg    Past Surgical History:  Procedure Laterality Date   ACETABULUM FRACTURE SURGERY Left    CESAREAN SECTION     CHOLECYSTECTOMY     COLONOSCOPY WITH PROPOFOL  N/A 05/31/2021   Procedure: COLONOSCOPY WITH PROPOFOL ;  Surgeon: Maryruth Ole DASEN, MD;  Location: [facility] ENDOSCOPY;  Service: Endoscopy;  Laterality: N/A;   ELBOW FRACTURE SURGERY  1995   ESOPHAGOGASTRODUODENOSCOPY (EGD) WITH PROPOFOL  N/A 05/31/2021   Procedure: ESOPHAGOGASTRODUODENOSCOPY (EGD) WITH PROPOFOL ;  Surgeon: Maryruth Ole DASEN, MD;  Location: [facility] ENDOSCOPY;  Service: Endoscopy;  Laterality: N/A;   FRACTURE SURGERY     left ankle complete repair, left elbow   HERNIA REPAIR     incisional   LAPAROSCOPIC GASTRIC SLEEVE RESECTION   2014   PARATHYROIDECTOMY N/A 10/25/2018   Procedure: PARATHYROIDECTOMY, DIABETIC, SLEEP APNEA;  Surgeon: Marolyn Nest, MD;  Location: [facility] ORS;  Service: General;  Laterality: N/A;   TONSILLECTOMY AND ADENOIDECTOMY N/A 12/08/2015   Procedure: TONSILLECTOMY AND ADENOIDECTOMY;  Surgeon: Chinita Hasten, MD;  Location: [facility] ORS;  Service: ENT;  Laterality: N/A;   TOTAL HIP ARTHROPLASTY Left 09/22/2022   Procedure: Left posterior total hip arthroplasty;  Surgeon: Lorelle Hussar, MD;  Location: [facility] ORS;  Service: Orthopedics;  Laterality: Left;   TUBAL LIGATION     Patient Active Problem List   Diagnosis Date Noted   Osteoarthritis of left hip 09/22/2022   Side pain 08/16/2022   Musculoskeletal pain 08/16/2022   Varicose veins of both legs with edema 01/05/2021   Swelling of limb 12/30/2020   Diabetes (HCC) 12/30/2020   Lymphedema 12/30/2020   S/P subtotal parathyroidectomy 11/08/2018   Hyperparathyroidism 06/20/2018   Thyroid  nodule 06/20/2018   Intractable pain 04/12/2015    PCP:  Ophelia Sage, MD  REFERRING PROVIDER: Norleen Gavel, MD  REFERRING DIAG:  [phone] (ICD-10-CM) - Left shoulder pain  M25.562 (ICD-10-CM) - Left knee pain    THERAPY DIAG:  Acute pain of left shoulder  Decreased ROM of left shoulder  Muscle weakness (generalized)  Acute  pain of left knee  Rationale for Evaluation and Treatment: Rehabilitation  ONSET DATE: 08/30/23  SUBJECTIVE:                                                                                                                                                                                      SUBJECTIVE STATEMENT:  Pt reports a 2/10 pain level upon arrival.  Pt notes it feels stiff and just has a dull ache, which has been the normal for her.  Pt has a goal to ice the shoulder more.    Hand dominance: Right  PERTINENT HISTORY: Pt was seen at [facility] ED on 08/30/23 after a fall sustained at work. Pt works in the emergency room and  reported that she slipped on a wet spot and fell onto her left shoulder and knee to avoid falling onto her L hip. Per ED note: Physical exam is generally reassuring. Shoulder joint itself has normal mobility and I am not concerned about dislocation. No evidence of clavicular injury. Mild proximal humerus fracture is possible and the patient is concerned about the fact that she has had multiple prior orthopedic injuries/fractures due to relatively minor falls. We will proceed with x-rays and anticipate restricted movement with shoulder sling, RICE, etc. Pt underwent L THA last June and has previous history of elbow surgery that prevents full movement of L wrist. Pt reports that she wore the sling provided in ED for a couple of weeks after the fall but has not worn it since.  PMH includes anemia, arthritis (back, L elbow, L hip, L ankle), asthma, DM, GERD, headache, hyperparathyroidism, lymphedema, seizure, sleep apnea  PAIN: Reported on L shoulder: Are you having pain? Yes: NPRS scale: 4/10 currently, worst 10/10 with movement, best: 4/10  Pain location: anterior shoulder Pain description: Sharp when it hits. Aggravating factors: reaching overhead/behind head, movement Relieving factors: Voltaren, Biofreeze, lidocaine  patches, rest  PRECAUTIONS: None  RED FLAGS: None   WEIGHT BEARING RESTRICTIONS: No  FALLS:  Has patient fallen in last 6 months? Yes. Number of falls 1- pt reports that this incident is her only fall  LIVING ENVIRONMENT: Lives with: lives with their spouse Lives in: Mobile home Stairs: Yes: External: 4 steps; can reach both Has following equipment at home: Single point cane and Walker - 2 wheeled  OCCUPATION: Works in ED at [employer]- pt reports that the pain hasn't affected her job duties that much because she is R handed  PLOF: Independent  PATIENT GOALS: Pt wants to be able to hold her 35 month old grandson, get range of motion back to use her L arm normally.  NEXT MD  VISIT: 10/12/23  OBJECTIVE:  Note: Objective measures were completed at Evaluation unless otherwise noted.  DIAGNOSTIC FINDINGS:  DG Humerus L (08/30/23): FINDINGS: There is no evidence of an acute fracture or dislocation. Radiopaque surgical screws and fixation wires are seen within the left olecranon process and left radial head. Soft tissues are unremarkable.   IMPRESSION: 1. No acute osseous abnormality. 2. Postoperative changes of the left elbow.   PATIENT SURVEYS:   QuickDASH Score: 68.2 / 100 = 68.2 %  COGNITION: Overall cognitive status: Within functional limits for tasks assessed     SENSATION: WFL- bilateral UE and LE  POSTURE: Pt holds L UE in guarded position at rest. L shoulder in sling upon arrival, but doffs with ease.   UPPER EXTREMITY ROM:   Active ROM Right eval Left PROM eval  Shoulder flexion 155 90  Shoulder extension    Shoulder abduction 120 90  Shoulder adduction    Shoulder internal rotation 75 NT  Shoulder external rotation 50 NT  Elbow flexion    Elbow extension    Wrist flexion    Wrist extension    Wrist ulnar deviation    Wrist radial deviation    Wrist pronation    Wrist supination    (Blank rows = not tested)   UPPER EXTREMITY MMT:  MMT Right eval Left eval  Shoulder flexion 4 NT  Shoulder extension    Shoulder abduction 3+ NT  Shoulder adduction    Shoulder internal rotation    Shoulder external rotation    Middle trapezius    Lower trapezius    Elbow flexion 4 NT  Elbow extension 4 NT  Wrist flexion    Wrist extension    Wrist ulnar deviation    Wrist radial deviation    Wrist pronation    Wrist supination    Grip strength (lbs)    (Blank rows = not tested)    PALPATION:  Pt with expected tenderness around the incision sites and still covered with the bandaging.                                                                                                                               TREATMENT DATE:  02/01/24    TherEx:  Seated pulleys into flexion/abduction, 2 min each direction  Standing shoulder ladder climbs into shoulder flexion/scaption, able to reach #28 in flexion, #25 in scaption, x10 each direction  Standing isometric shoulder flexion with physioball, 2x10 Standing isometric shoulder abduction with physioball, 2x10 Standing isometric shoulder extension with physioball, 2x10 Standing isometric shoulder adduction with physioball, 2x10   TherAct:  Standing scapular retractions, 7.5#, 2x10  Standing biceps curls with 1.5# bar, 2x10 Standing biceps curls with 2#, 2x10  Standing cup stacks with use of cones to incorporate reaching of the L shoulder in different planes, x2 sets  Standing shoulder circles with ball on wall, CW/CCW 45 sec bouts, x2 each direction      PATIENT EDUCATION:  Education details: Exercise technique Person educated: Patient Education method: Explanation Education comprehension: verbalized understanding  HOME EXERCISE PROGRAM: Access Code: [email or web address] URL: [email or web address] Date: 12/11/2023 Prepared by: Sidra Simpers  Exercises - Circular Shoulder Pendulum with Table Support  - 1 x daily - 7 x weekly - 3 sets - 10 reps - Flexion-Extension Shoulder Pendulum with Table Support  - 1 x daily - 7 x weekly - 3 sets - 10 reps - Horizontal Shoulder Pendulum with Table Support  - 1 x daily - 7 x weekly - 3 sets - 10 reps - Supine Scapular Retraction  - 1 x daily - 7 x weekly - 3 sets - 10 reps - Seated Shoulder Shrug Circles AROM Backward  - 1 x daily - 7 x weekly - 3 sets - 10 reps - Wrist AROM Flexion Extension  - 1 x daily - 7 x weekly - 3 sets - 10 reps - Elbow Flexion PROM  - 1 x daily - 7 x weekly - 3 sets - 10 reps - Elbow Extension PROM  - 1 x daily - 7 x weekly - 3 sets - 10 reps - Forearm Pronation PROM  - 1 x daily - 7 x weekly - 3 sets - 10 reps - Forearm Supination PROM  - 1 x daily - 7 x weekly - 3 sets - 10  reps   Access Code: [email or web address]  URL: [email or web address]  Date: 10/05/2023  Prepared by: Marina  Moser  Exercises  - Standing shoulder flexion wall slides  - 1 x daily - 7 x weekly - 2 sets - 10 reps - 5 hold  - Standing Shoulder Abduction Slides at Wall  - 1 x daily - 7 x weekly - 2 sets - 10 reps - 5 hold  - Standing Isometric Shoulder Flexion with Doorway - Arm Bent  - 1 x daily - 7 x weekly - 2 sets - 10 reps - 5 hold  - Standing Isometric Shoulder Abduction with Doorway - Arm Bent  - 1 x daily - 7 x weekly - 2 sets - 10 reps - 5 hold  - Standing Isometric Shoulder Internal Rotation at Doorway  - 1 x daily - 7 x weekly - 2 sets - 10 reps - 5 hold  - Standing Isometric Shoulder Extension with Doorway - Arm Bent  - 1 x daily - 7 x weekly - 2 sets - 10 reps - 5 hold  - Seated Shoulder Pendulum Exercise  - 1 x daily - 7 x weekly - 2 sets - 10 reps - 5 hold   ASSESSMENT:  CLINICAL IMPRESSION:    Pt is progressing well with the exercises given and is able to demonstrate improved ROM and strength with the shoulder without an increase in pain.  Pt is continuously improving in her muscle activation and is able to tolerate the exercises for longer bouts such as the wall circles.  Pt is able to activate the scapular stabilizer better as well with shoulder rolling prior to engaging in the exercises.   Pt will continue to benefit from skilled therapy to address remaining deficits in order to improve overall QoL and return to PLOF.          OBJECTIVE IMPAIRMENTS: Abnormal gait, decreased activity tolerance, decreased endurance, decreased mobility, difficulty walking, decreased ROM, decreased strength, hypomobility, increased edema, impaired perceived functional ability, impaired flexibility, impaired UE functional use, improper body mechanics, postural dysfunction, and pain.   ACTIVITY LIMITATIONS: carrying, lifting, bending, sitting, standing,  squatting, stairs, transfers, bed  mobility, bathing, dressing, self feeding, reach over head, hygiene/grooming, locomotion level, and caring for others  PARTICIPATION LIMITATIONS: meal prep, cleaning, laundry, driving, shopping, community activity, occupation, and yard work  PERSONAL FACTORS: Age, Fitness, Profession, and 3+ comorbidities: anemia, arthritis (back, L elbow, L hip, L ankle), asthma, DM, GERD, headache, hyperparathyroidism, lymphedema, seizure, sleep apnea are also affecting patient's functional outcome.   REHAB POTENTIAL: Good  CLINICAL DECISION MAKING: Evolving/moderate complexity  EVALUATION COMPLEXITY: Moderate   GOALS: Goals reviewed with patient? Yes  SHORT TERM GOALS: Target date: 10/24/2023  Patient will be independent in home exercise program to improve strength/mobility for better functional independence with ADLs. Baseline:See HEP above; 11/17/2023=Patient able to verbalize and today demonstrate good understanding of HEP especially now since she has received the results from MRI.  Goal status: MET   LONG TERM GOALS: Target date: 11/28/2023  Patient will decrease Quick DASH score by > 8 points demonstrating reduced self-reported upper extremity disability. Baseline: 68.2/100 01/10/24:  Goal status: INITIAL  2.  Patient will improve L shoulder AROM to > 140 degrees of flexion, scaption, and abduction for improved ability to perform overhead activities. Baseline: avoided AROM due to surgical procedure 01/10/24: avoided AROM due to surgical procedure Goal status: INITIAL  3.  Patient will report a worst pain of 6/10 on VAS in L shoulder to improve tolerance with ADLs and reduced symptoms with activities. Baseline: 10/10 at worst 01/10/24: 3/10 at worst Goal status: MET  4.  Patient will be able to tolerate holding her young grandson without an increase in L shoulder pain to show improved L UE function and increase pt's ability to participate in family care. Baseline: Pt is unable to pick up  grandson due to precautions of the L shoulder following RTC repair 01/10/24: Unable to do so at this time due to lifting restrictions and AROM restrictions Goal status: ONGOING   PLAN:  PT FREQUENCY: 2x/week  PT DURATION: 8 weeks  PLANNED INTERVENTIONS: 97164- PT Re-evaluation, 97750- Physical Performance Testing, 97110-Therapeutic exercises, 97530- Therapeutic activity, 97112- Neuromuscular re-education, 97535- Self Care, 02859- Manual therapy, [id number]- Gait training, [id number]- Orthotic Initial, [id number]- Orthotic/Prosthetic subsequent, [phone]- Canalith repositioning, [id number]- Splinting, [id number]- Electrical stimulation (manual), [id number]- Ultrasound, [id number]- Traction (mechanical), [id number]- Parrafin, 20560 (1-2 muscles), 20561 (3+ muscles)- Dry Needling, Patient/Family education, Balance training, Stair training, Taping, Joint mobilization, Spinal mobilization, Scar mobilization, Vestibular training, DME instructions, Cryotherapy, and Moist heat   PLAN FOR NEXT SESSION:   Isometric exercises Continue with strengthening within pain tolerable range   Fonda Simpers, PT, DPT Physical Therapist - [facility]  [facility]  02/01/24, 9:56 AM

## 2024-02-06 ENCOUNTER — Ambulatory Visit

## 2024-02-06 DIAGNOSIS — M25612 Stiffness of left shoulder, not elsewhere classified: Secondary | ICD-10-CM

## 2024-02-06 DIAGNOSIS — M6281 Muscle weakness (generalized): Secondary | ICD-10-CM | POA: Diagnosis not present

## 2024-02-06 DIAGNOSIS — M25562 Pain in left knee: Secondary | ICD-10-CM | POA: Diagnosis not present

## 2024-02-06 DIAGNOSIS — M25512 Pain in left shoulder: Secondary | ICD-10-CM

## 2024-02-06 NOTE — Therapy (Signed)
 OUTPATIENT PHYSICAL THERAPY L SHOULDER TREATMENT   Patient Name: Ashley [name] MRN: [id number] DOB:1965-06-12, 58 y.o., female Today's Date: 02/06/2024  END OF SESSION:   PT End of Session - 02/06/24 0725     Visit Number 16    Number of Visits 24    Date for Recertification  03/04/24    PT Start Time 0724    PT Stop Time 0803    PT Time Calculation (min) 39 min    Activity Tolerance Patient tolerated treatment well;Patient limited by pain    Behavior During Therapy [facility] for tasks assessed/performed          Past Medical History:  Diagnosis Date   Allergy    Anemia    Arthritis    back, left elbow, left hip, left ankle    Asthma    Blood transfusion without reported diagnosis    Complication of anesthesia    delayed emergence   Diabetes mellitus without complication (HCC)    diet controlled   Diverticulosis    GERD (gastroesophageal reflux disease)    Headache    History of hiatal hernia    History of kidney stones    Hyperparathyroidism    Lymphedema    PONV (postoperative nausea and vomiting)    S/P subtotal parathyroidectomy    Seizure (HCC)    as a child   Sleep apnea    cannot tolerate mask   Thyroid  nodule 06/20/2018   Varicose vein of leg    Past Surgical History:  Procedure Laterality Date   ACETABULUM FRACTURE SURGERY Left    CESAREAN SECTION     CHOLECYSTECTOMY     COLONOSCOPY WITH PROPOFOL  N/A 05/31/2021   Procedure: COLONOSCOPY WITH PROPOFOL ;  Surgeon: Maryruth Ole DASEN, MD;  Location: [facility] ENDOSCOPY;  Service: Endoscopy;  Laterality: N/A;   ELBOW FRACTURE SURGERY  1995   ESOPHAGOGASTRODUODENOSCOPY (EGD) WITH PROPOFOL  N/A 05/31/2021   Procedure: ESOPHAGOGASTRODUODENOSCOPY (EGD) WITH PROPOFOL ;  Surgeon: Maryruth Ole DASEN, MD;  Location: [facility] ENDOSCOPY;  Service: Endoscopy;  Laterality: N/A;   FRACTURE SURGERY     left ankle complete repair, left elbow   HERNIA REPAIR     incisional   LAPAROSCOPIC GASTRIC SLEEVE RESECTION   2014   PARATHYROIDECTOMY N/A 10/25/2018   Procedure: PARATHYROIDECTOMY, DIABETIC, SLEEP APNEA;  Surgeon: Marolyn Nest, MD;  Location: [facility] ORS;  Service: General;  Laterality: N/A;   TONSILLECTOMY AND ADENOIDECTOMY N/A 12/08/2015   Procedure: TONSILLECTOMY AND ADENOIDECTOMY;  Surgeon: Chinita Hasten, MD;  Location: [facility] ORS;  Service: ENT;  Laterality: N/A;   TOTAL HIP ARTHROPLASTY Left 09/22/2022   Procedure: Left posterior total hip arthroplasty;  Surgeon: Lorelle Hussar, MD;  Location: [facility] ORS;  Service: Orthopedics;  Laterality: Left;   TUBAL LIGATION     Patient Active Problem List   Diagnosis Date Noted   Osteoarthritis of left hip 09/22/2022   Side pain 08/16/2022   Musculoskeletal pain 08/16/2022   Varicose veins of both legs with edema 01/05/2021   Swelling of limb 12/30/2020   Diabetes (HCC) 12/30/2020   Lymphedema 12/30/2020   S/P subtotal parathyroidectomy 11/08/2018   Hyperparathyroidism 06/20/2018   Thyroid  nodule 06/20/2018   Intractable pain 04/12/2015    PCP:  Ophelia Sage, MD  REFERRING PROVIDER: Norleen Gavel, MD  REFERRING DIAG:  [phone] (ICD-10-CM) - Left shoulder pain  M25.562 (ICD-10-CM) - Left knee pain    THERAPY DIAG:  Acute pain of left shoulder  Decreased ROM of left shoulder  Muscle weakness (generalized)  Rationale  for Evaluation and Treatment: Rehabilitation  ONSET DATE: 08/30/23  SUBJECTIVE:                                                                                                                                                                                      SUBJECTIVE STATEMENT:  Pt reports that she feels a strong ache in the shoulder.  Pt otherwise is doing well.       Hand dominance: Right  PERTINENT HISTORY: Pt was seen at [facility] ED on 08/30/23 after a fall sustained at work. Pt works in the emergency room and reported that she slipped on a wet spot and fell onto her left shoulder and knee to avoid falling onto her L  hip. Per ED note: Physical exam is generally reassuring. Shoulder joint itself has normal mobility and I am not concerned about dislocation. No evidence of clavicular injury. Mild proximal humerus fracture is possible and the patient is concerned about the fact that she has had multiple prior orthopedic injuries/fractures due to relatively minor falls. We will proceed with x-rays and anticipate restricted movement with shoulder sling, RICE, etc. Pt underwent L THA last June and has previous history of elbow surgery that prevents full movement of L wrist. Pt reports that she wore the sling provided in ED for a couple of weeks after the fall but has not worn it since.  PMH includes anemia, arthritis (back, L elbow, L hip, L ankle), asthma, DM, GERD, headache, hyperparathyroidism, lymphedema, seizure, sleep apnea  PAIN: Reported on L shoulder: Are you having pain? Yes: NPRS scale: 4/10 currently, worst 10/10 with movement, best: 4/10  Pain location: anterior shoulder Pain description: Sharp when it hits. Aggravating factors: reaching overhead/behind head, movement Relieving factors: Voltaren, Biofreeze, lidocaine  patches, rest  PRECAUTIONS: None  RED FLAGS: None   WEIGHT BEARING RESTRICTIONS: No  FALLS:  Has patient fallen in last 6 months? Yes. Number of falls 1- pt reports that this incident is her only fall  LIVING ENVIRONMENT: Lives with: lives with their spouse Lives in: Mobile home Stairs: Yes: External: 4 steps; can reach both Has following equipment at home: Single point cane and Walker - 2 wheeled  OCCUPATION: Works in ED at [employer]- pt reports that the pain hasn't affected her job duties that much because she is R handed  PLOF: Independent  PATIENT GOALS: Pt wants to be able to hold her 54 month old grandson, get range of motion back to use her L arm normally.  NEXT MD VISIT: 10/12/23  OBJECTIVE:  Note: Objective measures were completed at Evaluation unless otherwise  noted.  DIAGNOSTIC FINDINGS:  DG Humerus L (08/30/23): FINDINGS: There is  no evidence of an acute fracture or dislocation. Radiopaque surgical screws and fixation wires are seen within the left olecranon process and left radial head. Soft tissues are unremarkable.   IMPRESSION: 1. No acute osseous abnormality. 2. Postoperative changes of the left elbow.   PATIENT SURVEYS:   QuickDASH Score: 68.2 / 100 = 68.2 %  COGNITION: Overall cognitive status: Within functional limits for tasks assessed     SENSATION: WFL- bilateral UE and LE  POSTURE: Pt holds L UE in guarded position at rest. L shoulder in sling upon arrival, but doffs with ease.   UPPER EXTREMITY ROM:   Active ROM Right eval Left PROM eval  Shoulder flexion 155 90  Shoulder extension    Shoulder abduction 120 90  Shoulder adduction    Shoulder internal rotation 75 NT  Shoulder external rotation 50 NT  Elbow flexion    Elbow extension    Wrist flexion    Wrist extension    Wrist ulnar deviation    Wrist radial deviation    Wrist pronation    Wrist supination    (Blank rows = not tested)   UPPER EXTREMITY MMT:  MMT Right eval Left eval  Shoulder flexion 4 NT  Shoulder extension    Shoulder abduction 3+ NT  Shoulder adduction    Shoulder internal rotation    Shoulder external rotation    Middle trapezius    Lower trapezius    Elbow flexion 4 NT  Elbow extension 4 NT  Wrist flexion    Wrist extension    Wrist ulnar deviation    Wrist radial deviation    Wrist pronation    Wrist supination    Grip strength (lbs)    (Blank rows = not tested)    PALPATION:  Pt with expected tenderness around the incision sites and still covered with the bandaging.                                                                                                                               TREATMENT DATE: 02/06/24  TherEx:  Seated pulleys into flexion/abduction, 2 min each direction  Standing shoulder  ladder climbs into shoulder flexion/scaption, able to reach #28 in flexion, #25 in scaption, x10 each direction  Standing isometric shoulder flexion with physioball, 3 sec hold, x15 Standing isometric shoulder abduction with physioball, 3 sec hold, x15 Standing isometric shoulder extension with physioball, 3 sec hold, x15 Standing isometric shoulder adduction with physioball, 3 sec hold, x15   TherAct:  Standing AAROM L shoulder flexion with UE Ranger, 2x10 Standing AAROM L shoulder into horizontal abduction/adduction, 2x10 each direction Standing AAROM L shoulder circles, CW/CCW, 2x10 each direction  Standing scapular retractions, 17.5#, 2x15  Standing biceps curls with 2#, 2x10  Standing reach with use of blaze pods set up for forward flexion, horizontal abduction/adduction and lower on the mirror, 4 sets of 30 hits with Random function   Seated UBE with  minimal resistance applied, 2.5 minutes forward      PATIENT EDUCATION: Education details: Exercise technique Person educated: Patient Education method: Explanation Education comprehension: verbalized understanding  HOME EXERCISE PROGRAM: Access Code: [email or web address] URL: [email or web address] Date: 12/11/2023 Prepared by: Sidra Simpers  Exercises - Circular Shoulder Pendulum with Table Support  - 1 x daily - 7 x weekly - 3 sets - 10 reps - Flexion-Extension Shoulder Pendulum with Table Support  - 1 x daily - 7 x weekly - 3 sets - 10 reps - Horizontal Shoulder Pendulum with Table Support  - 1 x daily - 7 x weekly - 3 sets - 10 reps - Supine Scapular Retraction  - 1 x daily - 7 x weekly - 3 sets - 10 reps - Seated Shoulder Shrug Circles AROM Backward  - 1 x daily - 7 x weekly - 3 sets - 10 reps - Wrist AROM Flexion Extension  - 1 x daily - 7 x weekly - 3 sets - 10 reps - Elbow Flexion PROM  - 1 x daily - 7 x weekly - 3 sets - 10 reps - Elbow Extension PROM  - 1 x daily - 7 x weekly - 3 sets - 10 reps - Forearm  Pronation PROM  - 1 x daily - 7 x weekly - 3 sets - 10 reps - Forearm Supination PROM  - 1 x daily - 7 x weekly - 3 sets - 10 reps   Access Code: [email or web address]  URL: [email or web address]  Date: 10/05/2023  Prepared by: Marina  Moser  Exercises  - Standing shoulder flexion wall slides  - 1 x daily - 7 x weekly - 2 sets - 10 reps - 5 hold  - Standing Shoulder Abduction Slides at Wall  - 1 x daily - 7 x weekly - 2 sets - 10 reps - 5 hold  - Standing Isometric Shoulder Flexion with Doorway - Arm Bent  - 1 x daily - 7 x weekly - 2 sets - 10 reps - 5 hold  - Standing Isometric Shoulder Abduction with Doorway - Arm Bent  - 1 x daily - 7 x weekly - 2 sets - 10 reps - 5 hold  - Standing Isometric Shoulder Internal Rotation at Doorway  - 1 x daily - 7 x weekly - 2 sets - 10 reps - 5 hold  - Standing Isometric Shoulder Extension with Doorway - Arm Bent  - 1 x daily - 7 x weekly - 2 sets - 10 reps - 5 hold  - Seated Shoulder Pendulum Exercise  - 1 x daily - 7 x weekly - 2 sets - 10 reps - 5 hold   ASSESSMENT:  CLINICAL IMPRESSION:    Pt continues to respond well to the exercises and is making significant improvement in her ability to perform the tasks without an increase in pain.  Pt also able to achieve greater ROM without any significant pain as well.  Pt ultimately is making good progress towards goals and will continue to improve upon increasing strength with a reduction in pain.   Pt will continue to benefit from skilled therapy to address remaining deficits in order to improve overall QoL and return to PLOF.        OBJECTIVE IMPAIRMENTS: Abnormal gait, decreased activity tolerance, decreased endurance, decreased mobility, difficulty walking, decreased ROM, decreased strength, hypomobility, increased edema, impaired perceived functional ability, impaired flexibility, impaired UE functional use, improper body mechanics, postural dysfunction, and pain.   ACTIVITY LIMITATIONS: carrying,  lifting,  bending, sitting, standing, squatting, stairs, transfers, bed mobility, bathing, dressing, self feeding, reach over head, hygiene/grooming, locomotion level, and caring for others  PARTICIPATION LIMITATIONS: meal prep, cleaning, laundry, driving, shopping, community activity, occupation, and yard work  PERSONAL FACTORS: Age, Fitness, Profession, and 3+ comorbidities: anemia, arthritis (back, L elbow, L hip, L ankle), asthma, DM, GERD, headache, hyperparathyroidism, lymphedema, seizure, sleep apnea are also affecting patient's functional outcome.   REHAB POTENTIAL: Good  CLINICAL DECISION MAKING: Evolving/moderate complexity  EVALUATION COMPLEXITY: Moderate   GOALS: Goals reviewed with patient? Yes  SHORT TERM GOALS: Target date: 10/24/2023  Patient will be independent in home exercise program to improve strength/mobility for better functional independence with ADLs. Baseline:See HEP above; 11/17/2023=Patient able to verbalize and today demonstrate good understanding of HEP especially now since she has received the results from MRI.  Goal status: MET   LONG TERM GOALS: Target date: 11/28/2023  Patient will decrease Quick DASH score by > 8 points demonstrating reduced self-reported upper extremity disability. Baseline: 68.2/100 01/10/24:  Goal status: INITIAL  2.  Patient will improve L shoulder AROM to > 140 degrees of flexion, scaption, and abduction for improved ability to perform overhead activities. Baseline: avoided AROM due to surgical procedure 01/10/24: avoided AROM due to surgical procedure Goal status: INITIAL  3.  Patient will report a worst pain of 6/10 on VAS in L shoulder to improve tolerance with ADLs and reduced symptoms with activities. Baseline: 10/10 at worst 01/10/24: 3/10 at worst Goal status: MET  4.  Patient will be able to tolerate holding her young grandson without an increase in L shoulder pain to show improved L UE function and increase pt's  ability to participate in family care. Baseline: Pt is unable to pick up grandson due to precautions of the L shoulder following RTC repair 01/10/24: Unable to do so at this time due to lifting restrictions and AROM restrictions Goal status: ONGOING   PLAN:  PT FREQUENCY: 2x/week  PT DURATION: 8 weeks  PLANNED INTERVENTIONS: 97164- PT Re-evaluation, 97750- Physical Performance Testing, 97110-Therapeutic exercises, 97530- Therapeutic activity, 97112- Neuromuscular re-education, 97535- Self Care, 02859- Manual therapy, [id number]- Gait training, [id number]- Orthotic Initial, [id number]- Orthotic/Prosthetic subsequent, [phone]- Canalith repositioning, [id number]- Splinting, [id number]- Electrical stimulation (manual), [id number]- Ultrasound, [id number]- Traction (mechanical), [id number]- Parrafin, 20560 (1-2 muscles), 20561 (3+ muscles)- Dry Needling, Patient/Family education, Balance training, Stair training, Taping, Joint mobilization, Spinal mobilization, Scar mobilization, Vestibular training, DME instructions, Cryotherapy, and Moist heat   PLAN FOR NEXT SESSION:     Continue with strengthening within pain tolerable range   Fonda Simpers, PT, DPT Physical Therapist - [facility] [address] [facility]  02/06/24, 8:31 AM

## 2024-02-08 ENCOUNTER — Ambulatory Visit

## 2024-02-08 DIAGNOSIS — M25512 Pain in left shoulder: Secondary | ICD-10-CM

## 2024-02-08 DIAGNOSIS — Z9889 Other specified postprocedural states: Secondary | ICD-10-CM | POA: Diagnosis not present

## 2024-02-08 DIAGNOSIS — M25562 Pain in left knee: Secondary | ICD-10-CM | POA: Diagnosis not present

## 2024-02-08 DIAGNOSIS — M25612 Stiffness of left shoulder, not elsewhere classified: Secondary | ICD-10-CM | POA: Diagnosis not present

## 2024-02-08 DIAGNOSIS — E119 Type 2 diabetes mellitus without complications: Secondary | ICD-10-CM | POA: Diagnosis not present

## 2024-02-08 DIAGNOSIS — M6281 Muscle weakness (generalized): Secondary | ICD-10-CM

## 2024-02-08 DIAGNOSIS — E785 Hyperlipidemia, unspecified: Secondary | ICD-10-CM | POA: Diagnosis not present

## 2024-02-08 DIAGNOSIS — Z9089 Acquired absence of other organs: Secondary | ICD-10-CM | POA: Diagnosis not present

## 2024-02-08 NOTE — Therapy (Signed)
 OUTPATIENT PHYSICAL THERAPY L SHOULDER TREATMENT   Patient Name: Ashley [name] MRN: [id number] DOB:1965/07/03, 58 y.o., female Today's Date: 02/08/2024  END OF SESSION:   PT End of Session - 02/08/24 0739     Visit Number 17    Number of Visits 24    Date for Recertification  03/04/24    PT Start Time 0739    PT Stop Time 0803    PT Time Calculation (min) 24 min    Activity Tolerance Patient tolerated treatment well;Patient limited by pain    Behavior During Therapy [facility] for tasks assessed/performed          Past Medical History:  Diagnosis Date   Allergy    Anemia    Arthritis    back, left elbow, left hip, left ankle    Asthma    Blood transfusion without reported diagnosis    Complication of anesthesia    delayed emergence   Diabetes mellitus without complication (HCC)    diet controlled   Diverticulosis    GERD (gastroesophageal reflux disease)    Headache    History of hiatal hernia    History of kidney stones    Hyperparathyroidism    Lymphedema    PONV (postoperative nausea and vomiting)    S/P subtotal parathyroidectomy    Seizure (HCC)    as a child   Sleep apnea    cannot tolerate mask   Thyroid  nodule 06/20/2018   Varicose vein of leg    Past Surgical History:  Procedure Laterality Date   ACETABULUM FRACTURE SURGERY Left    CESAREAN SECTION     CHOLECYSTECTOMY     COLONOSCOPY WITH PROPOFOL  N/A 05/31/2021   Procedure: COLONOSCOPY WITH PROPOFOL ;  Surgeon: Maryruth Ole DASEN, MD;  Location: [facility] ENDOSCOPY;  Service: Endoscopy;  Laterality: N/A;   ELBOW FRACTURE SURGERY  1995   ESOPHAGOGASTRODUODENOSCOPY (EGD) WITH PROPOFOL  N/A 05/31/2021   Procedure: ESOPHAGOGASTRODUODENOSCOPY (EGD) WITH PROPOFOL ;  Surgeon: Maryruth Ole DASEN, MD;  Location: [facility] ENDOSCOPY;  Service: Endoscopy;  Laterality: N/A;   FRACTURE SURGERY     left ankle complete repair, left elbow   HERNIA REPAIR     incisional   LAPAROSCOPIC GASTRIC SLEEVE RESECTION   2014   PARATHYROIDECTOMY N/A 10/25/2018   Procedure: PARATHYROIDECTOMY, DIABETIC, SLEEP APNEA;  Surgeon: Marolyn Nest, MD;  Location: [facility] ORS;  Service: General;  Laterality: N/A;   TONSILLECTOMY AND ADENOIDECTOMY N/A 12/08/2015   Procedure: TONSILLECTOMY AND ADENOIDECTOMY;  Surgeon: Chinita Hasten, MD;  Location: [facility] ORS;  Service: ENT;  Laterality: N/A;   TOTAL HIP ARTHROPLASTY Left 09/22/2022   Procedure: Left posterior total hip arthroplasty;  Surgeon: Lorelle Hussar, MD;  Location: [facility] ORS;  Service: Orthopedics;  Laterality: Left;   TUBAL LIGATION     Patient Active Problem List   Diagnosis Date Noted   Osteoarthritis of left hip 09/22/2022   Side pain 08/16/2022   Musculoskeletal pain 08/16/2022   Varicose veins of both legs with edema 01/05/2021   Swelling of limb 12/30/2020   Diabetes (HCC) 12/30/2020   Lymphedema 12/30/2020   S/P subtotal parathyroidectomy 11/08/2018   Hyperparathyroidism 06/20/2018   Thyroid  nodule 06/20/2018   Intractable pain 04/12/2015    PCP:  Ophelia Sage, MD  REFERRING PROVIDER: Norleen Gavel, MD  REFERRING DIAG:  [phone] (ICD-10-CM) - Left shoulder pain  M25.562 (ICD-10-CM) - Left knee pain    THERAPY DIAG:  Acute pain of left shoulder  Decreased ROM of left shoulder  Muscle weakness (generalized)  Acute  pain of left knee  Rationale for Evaluation and Treatment: Rehabilitation  ONSET DATE: 08/30/23  SUBJECTIVE:                                                                                                                                                                                      SUBJECTIVE STATEMENT:  Pt late to arrival speaking of being in a meeting and forgetting what time her appointment actually was.     Hand dominance: Right  PERTINENT HISTORY: Pt was seen at [facility] ED on 08/30/23 after a fall sustained at work. Pt works in the emergency room and reported that she slipped on a wet spot and fell onto her left  shoulder and knee to avoid falling onto her L hip. Per ED note: Physical exam is generally reassuring. Shoulder joint itself has normal mobility and I am not concerned about dislocation. No evidence of clavicular injury. Mild proximal humerus fracture is possible and the patient is concerned about the fact that she has had multiple prior orthopedic injuries/fractures due to relatively minor falls. We will proceed with x-rays and anticipate restricted movement with shoulder sling, RICE, etc. Pt underwent L THA last June and has previous history of elbow surgery that prevents full movement of L wrist. Pt reports that she wore the sling provided in ED for a couple of weeks after the fall but has not worn it since.  PMH includes anemia, arthritis (back, L elbow, L hip, L ankle), asthma, DM, GERD, headache, hyperparathyroidism, lymphedema, seizure, sleep apnea  PAIN: Reported on L shoulder: Are you having pain? Yes: NPRS scale: 4/10 currently, worst 10/10 with movement, best: 4/10  Pain location: anterior shoulder Pain description: Sharp when it hits. Aggravating factors: reaching overhead/behind head, movement Relieving factors: Voltaren, Biofreeze, lidocaine  patches, rest  PRECAUTIONS: None  RED FLAGS: None   WEIGHT BEARING RESTRICTIONS: No  FALLS:  Has patient fallen in last 6 months? Yes. Number of falls 1- pt reports that this incident is her only fall  LIVING ENVIRONMENT: Lives with: lives with their spouse Lives in: Mobile home Stairs: Yes: External: 4 steps; can reach both Has following equipment at home: Single point cane and Walker - 2 wheeled  OCCUPATION: Works in ED at [employer]- pt reports that the pain hasn't affected her job duties that much because she is R handed  PLOF: Independent  PATIENT GOALS: Pt wants to be able to hold her 75 month old grandson, get range of motion back to use her L arm normally.  NEXT MD VISIT: 10/12/23  OBJECTIVE:  Note: Objective measures were  completed at Evaluation unless otherwise noted.  DIAGNOSTIC FINDINGS:  DG Humerus  L (08/30/23): FINDINGS: There is no evidence of an acute fracture or dislocation. Radiopaque surgical screws and fixation wires are seen within the left olecranon process and left radial head. Soft tissues are unremarkable.   IMPRESSION: 1. No acute osseous abnormality. 2. Postoperative changes of the left elbow.   PATIENT SURVEYS:   QuickDASH Score: 68.2 / 100 = 68.2 %  COGNITION: Overall cognitive status: Within functional limits for tasks assessed     SENSATION: WFL- bilateral UE and LE  POSTURE: Pt holds L UE in guarded position at rest. L shoulder in sling upon arrival, but doffs with ease.   UPPER EXTREMITY ROM:   Active ROM Right eval Left PROM eval  Shoulder flexion 155 90  Shoulder extension    Shoulder abduction 120 90  Shoulder adduction    Shoulder internal rotation 75 NT  Shoulder external rotation 50 NT  Elbow flexion    Elbow extension    Wrist flexion    Wrist extension    Wrist ulnar deviation    Wrist radial deviation    Wrist pronation    Wrist supination    (Blank rows = not tested)   UPPER EXTREMITY MMT:  MMT Right eval Left eval  Shoulder flexion 4 NT  Shoulder extension    Shoulder abduction 3+ NT  Shoulder adduction    Shoulder internal rotation    Shoulder external rotation    Middle trapezius    Lower trapezius    Elbow flexion 4 NT  Elbow extension 4 NT  Wrist flexion    Wrist extension    Wrist ulnar deviation    Wrist radial deviation    Wrist pronation    Wrist supination    Grip strength (lbs)    (Blank rows = not tested)    PALPATION:  Pt with expected tenderness around the incision sites and still covered with the bandaging.                                                                                                                               TREATMENT DATE: 02/08/24  TherEx:  Standing shoulder ladder to #27 in  forward flexion, #27 in scaption/abduction  Seated pulleys into flexion/abduction, 2 min each direction  Standing tricep extensions with RTB, 2x15 Standing resisted ER with RTB, 2x15 each UE  Standing biceps curls with 3#, 2x10 each UE   TherAct:  Standing AAROM L shoulder flexion with UE Ranger, 2x10 Standing AAROM L shoulder into horizontal abduction/adduction, 2x10 each direction Standing AAROM L shoulder circles, CW/CCW, 2x10 each direction  Standing scapular retractions, 17.5#, 2x15     PATIENT EDUCATION: Education details: Exercise technique Person educated: Patient Education method: Explanation Education comprehension: verbalized understanding  HOME EXERCISE PROGRAM: Access Code: [email or web address] URL: [email or web address] Date: 12/11/2023 Prepared by: Sidra Simpers  Exercises - Circular Shoulder Pendulum with Table Support  - 1 x daily - 7 x weekly - 3 sets - 10 reps -  Flexion-Extension Shoulder Pendulum with Table Support  - 1 x daily - 7 x weekly - 3 sets - 10 reps - Horizontal Shoulder Pendulum with Table Support  - 1 x daily - 7 x weekly - 3 sets - 10 reps - Supine Scapular Retraction  - 1 x daily - 7 x weekly - 3 sets - 10 reps - Seated Shoulder Shrug Circles AROM Backward  - 1 x daily - 7 x weekly - 3 sets - 10 reps - Wrist AROM Flexion Extension  - 1 x daily - 7 x weekly - 3 sets - 10 reps - Elbow Flexion PROM  - 1 x daily - 7 x weekly - 3 sets - 10 reps - Elbow Extension PROM  - 1 x daily - 7 x weekly - 3 sets - 10 reps - Forearm Pronation PROM  - 1 x daily - 7 x weekly - 3 sets - 10 reps - Forearm Supination PROM  - 1 x daily - 7 x weekly - 3 sets - 10 reps   Access Code: [email or web address]  URL: [email or web address]  Date: 10/05/2023  Prepared by: Marina  Moser  Exercises  - Standing shoulder flexion wall slides  - 1 x daily - 7 x weekly - 2 sets - 10 reps - 5 hold  - Standing Shoulder Abduction Slides at Wall  - 1 x daily - 7 x weekly -  2 sets - 10 reps - 5 hold  - Standing Isometric Shoulder Flexion with Doorway - Arm Bent  - 1 x daily - 7 x weekly - 2 sets - 10 reps - 5 hold  - Standing Isometric Shoulder Abduction with Doorway - Arm Bent  - 1 x daily - 7 x weekly - 2 sets - 10 reps - 5 hold  - Standing Isometric Shoulder Internal Rotation at Doorway  - 1 x daily - 7 x weekly - 2 sets - 10 reps - 5 hold  - Standing Isometric Shoulder Extension with Doorway - Arm Bent  - 1 x daily - 7 x weekly - 2 sets - 10 reps - 5 hold  - Seated Shoulder Pendulum Exercise  - 1 x daily - 7 x weekly - 2 sets - 10 reps - 5 hold   ASSESSMENT:  CLINICAL IMPRESSION:    Pt's late arrival to the clinic limited session yet again.  Pt still demonstrating increased ROM and was able to tolerate increased resistance with exercises as well.  Pt still noting some residual pain at times, but denies any specific pain with movements or exercises.  Pt given RTB to perform exercises at home as part of HEP.   Pt will continue to benefit from skilled therapy to address remaining deficits in order to improve overall QoL and return to PLOF.        OBJECTIVE IMPAIRMENTS: Abnormal gait, decreased activity tolerance, decreased endurance, decreased mobility, difficulty walking, decreased ROM, decreased strength, hypomobility, increased edema, impaired perceived functional ability, impaired flexibility, impaired UE functional use, improper body mechanics, postural dysfunction, and pain.   ACTIVITY LIMITATIONS: carrying, lifting, bending, sitting, standing, squatting, stairs, transfers, bed mobility, bathing, dressing, self feeding, reach over head, hygiene/grooming, locomotion level, and caring for others  PARTICIPATION LIMITATIONS: meal prep, cleaning, laundry, driving, shopping, community activity, occupation, and yard work  PERSONAL FACTORS: Age, Fitness, Profession, and 3+ comorbidities: anemia, arthritis (back, L elbow, L hip, L ankle), asthma, DM, GERD,  headache, hyperparathyroidism, lymphedema, seizure, sleep apnea are also affecting patient's functional outcome.  REHAB POTENTIAL: Good  CLINICAL DECISION MAKING: Evolving/moderate complexity  EVALUATION COMPLEXITY: Moderate   GOALS: Goals reviewed with patient? Yes  SHORT TERM GOALS: Target date: 10/24/2023  Patient will be independent in home exercise program to improve strength/mobility for better functional independence with ADLs. Baseline:See HEP above; 11/17/2023=Patient able to verbalize and today demonstrate good understanding of HEP especially now since she has received the results from MRI.  Goal status: MET   LONG TERM GOALS: Target date: 11/28/2023  Patient will decrease Quick DASH score by > 8 points demonstrating reduced self-reported upper extremity disability. Baseline: 68.2/100 01/10/24:  Goal status: INITIAL  2.  Patient will improve L shoulder AROM to > 140 degrees of flexion, scaption, and abduction for improved ability to perform overhead activities. Baseline: avoided AROM due to surgical procedure 01/10/24: avoided AROM due to surgical procedure Goal status: INITIAL  3.  Patient will report a worst pain of 6/10 on VAS in L shoulder to improve tolerance with ADLs and reduced symptoms with activities. Baseline: 10/10 at worst 01/10/24: 3/10 at worst Goal status: MET  4.  Patient will be able to tolerate holding her young grandson without an increase in L shoulder pain to show improved L UE function and increase pt's ability to participate in family care. Baseline: Pt is unable to pick up grandson due to precautions of the L shoulder following RTC repair 01/10/24: Unable to do so at this time due to lifting restrictions and AROM restrictions Goal status: ONGOING   PLAN:  PT FREQUENCY: 2x/week  PT DURATION: 8 weeks  PLANNED INTERVENTIONS: 97164- PT Re-evaluation, 97750- Physical Performance Testing, 97110-Therapeutic exercises, 97530- Therapeutic  activity, 97112- Neuromuscular re-education, 97535- Self Care, 02859- Manual therapy, [id number]- Gait training, [id number]- Orthotic Initial, [id number]- Orthotic/Prosthetic subsequent, [phone]- Canalith repositioning, [id number]- Splinting, [id number]- Electrical stimulation (manual), [id number]- Ultrasound, [id number]- Traction (mechanical), [id number]- Parrafin, 20560 (1-2 muscles), 20561 (3+ muscles)- Dry Needling, Patient/Family education, Balance training, Stair training, Taping, Joint mobilization, Spinal mobilization, Scar mobilization, Vestibular training, DME instructions, Cryotherapy, and Moist heat   PLAN FOR NEXT SESSION:   Continue with strengthening within pain tolerable range   Fonda Simpers, PT, DPT Physical Therapist - [facility]  02/08/24, 7:40 AM

## 2024-02-13 ENCOUNTER — Ambulatory Visit

## 2024-02-13 DIAGNOSIS — M25512 Pain in left shoulder: Secondary | ICD-10-CM

## 2024-02-13 DIAGNOSIS — M6281 Muscle weakness (generalized): Secondary | ICD-10-CM | POA: Diagnosis not present

## 2024-02-13 DIAGNOSIS — M25612 Stiffness of left shoulder, not elsewhere classified: Secondary | ICD-10-CM

## 2024-02-13 DIAGNOSIS — M25562 Pain in left knee: Secondary | ICD-10-CM

## 2024-02-13 NOTE — Therapy (Signed)
 OUTPATIENT PHYSICAL THERAPY L SHOULDER TREATMENT   Patient Name: Ashley [name] MRN: [id number] DOB:[birth year], 58 y.o., female Today's Date: 02/13/2024  END OF SESSION:   PT End of Session - 02/13/24 0724     Visit Number 18    Number of Visits 24    Date for Recertification  03/04/24    PT Start Time 0718    PT Stop Time 0800    PT Time Calculation (min) 42 min    Activity Tolerance Patient tolerated treatment well;Patient limited by pain    Behavior During Therapy [facility] for tasks assessed/performed          Past Medical History:  Diagnosis Date   Allergy    Anemia    Arthritis    back, left elbow, left hip, left ankle    Asthma    Blood transfusion without reported diagnosis    Complication of anesthesia    delayed emergence   Diabetes mellitus without complication (HCC)    diet controlled   Diverticulosis    GERD (gastroesophageal reflux disease)    Headache    History of hiatal hernia    History of kidney stones    Hyperparathyroidism    Lymphedema    PONV (postoperative nausea and vomiting)    S/P subtotal parathyroidectomy    Seizure (HCC)    as a child   Sleep apnea    cannot tolerate mask   Thyroid  nodule 06/20/2018   Varicose vein of leg    Past Surgical History:  Procedure Laterality Date   ACETABULUM FRACTURE SURGERY Left    CESAREAN SECTION     CHOLECYSTECTOMY     COLONOSCOPY WITH PROPOFOL  N/A 05/31/2021   Procedure: COLONOSCOPY WITH PROPOFOL ;  Surgeon: Maryruth Ole DASEN, MD;  Location: [facility] END[facility];  Service: Endoscopy;  Laterality: N/A;   ELBOW FRACTURE SURGERY  1995   ESOPHAGOGASTRODUODENOSCOPY (EGD) WITH PROPOFOL  N/A 05/31/2021   Procedure: ESOPHAGOGASTRODUODENOSCOPY (EGD) WITH PROPOFOL ;  Surgeon: Maryruth Ole DASEN, MD;  Location: [facility] ENDOSCOPY;  Service: Endoscopy;  Laterality: N/A;   FRACTURE SURGERY     left ankle complete repair, left elbow   HERNIA REPAIR     incisional   LAPAROSCOPIC GASTRIC SLEEVE RESECTION   2014   PARATHYROIDECTOMY N/A 10/25/2018   Procedure: PARATHYROIDECTOMY, DIABETIC, SLEEP APNEA;  Surgeon: Marolyn Nest, MD;  Location: [facility] ORS;  Service: General;  Laterality: N/A;   TONSILLECTOMY AND ADENOIDECTOMY N/A 12/08/2015   Procedure: TONSILLECTOMY AND ADENOIDECTOMY;  Surgeon: Chinita Hasten, MD;  Location: [facility] ORS;  Service: ENT;  Laterality: N/A;   TOTAL HIP ARTHROPLASTY Left 09/22/2022   Procedure: Left posterior total hip arthroplasty;  Surgeon: Lorelle Hussar, MD;  Location: [facility] ORS;  Service: Orthopedics;  Laterality: Left;   TUBAL LIGATION     Patient Active Problem List   Diagnosis Date Noted   Osteoarthritis of left hip 09/22/2022   Side pain 08/16/2022   Musculoskeletal pain 08/16/2022   Varicose veins of both legs with edema 01/05/2021   Swelling of limb 12/30/2020   Diabetes (HCC) 12/30/2020   Lymphedema 12/30/2020   S/P subtotal parathyroidectomy 11/08/2018   Hyperparathyroidism 06/20/2018   Thyroid  nodule 06/20/2018   Intractable pain 04/12/2015    PCP:  Ophelia Sage, MD  REFERRING PROVIDER: Norleen Gavel, MD  REFERRING DIAG:  [phone] (ICD-10-CM) - Left shoulder pain  M25.562 (ICD-10-CM) - Left knee pain    THERAPY DIAG:  Acute pain of left shoulder  Decreased ROM of left shoulder  Muscle weakness (generalized)  Acute  pain of left knee  Rationale for Evaluation and Treatment: Rehabilitation  ONSET DATE: 08/30/23  SUBJECTIVE:                                                                                                                                                                                      SUBJECTIVE STATEMENT:  Pt reports that she's been having pain in the R shoulder over the past several days, noting that the bone fragments that are located in the R elbow.  Pt reports that she feels like both of her shoulder have just been out of commission over the past several days.      Hand dominance: Right  PERTINENT HISTORY: Pt was  seen at [facility] ED on 08/30/23 after a fall sustained at work. Pt works in the emergency room and reported that she slipped on a wet spot and fell onto her left shoulder and knee to avoid falling onto her L hip. Per ED note: Physical exam is generally reassuring. Shoulder joint itself has normal mobility and I am not concerned about dislocation. No evidence of clavicular injury. Mild proximal humerus fracture is possible and the patient is concerned about the fact that she has had multiple prior orthopedic injuries/fractures due to relatively minor falls. We will proceed with x-rays and anticipate restricted movement with shoulder sling, RICE, etc. Pt underwent L THA last June and has previous history of elbow surgery that prevents full movement of L wrist. Pt reports that she wore the sling provided in ED for a couple of weeks after the fall but has not worn it since.  PMH includes anemia, arthritis (back, L elbow, L hip, L ankle), asthma, DM, GERD, headache, hyperparathyroidism, lymphedema, seizure, sleep apnea  PAIN: Reported on L shoulder: Are you having pain? Yes: NPRS scale: 4/10 currently, worst 10/10 with movement, best: 4/10  Pain location: anterior shoulder Pain description: Sharp when it hits. Aggravating factors: reaching overhead/behind head, movement Relieving factors: Voltaren, Biofreeze, lidocaine  patches, rest  PRECAUTIONS: None  RED FLAGS: None   WEIGHT BEARING RESTRICTIONS: No  FALLS:  Has patient fallen in last 6 months? Yes. Number of falls 1- pt reports that this incident is her only fall  LIVING ENVIRONMENT: Lives with: lives with their spouse Lives in: Mobile home Stairs: Yes: External: 4 steps; can reach both Has following equipment at home: Single point cane and Walker - 2 wheeled  OCCUPATION: Works in ED at [employer]- pt reports that the pain hasn't affected her job duties that much because she is R handed  PLOF: Independent  PATIENT GOALS: Pt wants to be able to  hold her 1 month old grandson, get range  of motion back to use her L arm normally.  NEXT MD VISIT: 10/12/23  OBJECTIVE:  Note: Objective measures were completed at Evaluation unless otherwise noted.  DIAGNOSTIC FINDINGS:  DG Humerus L (08/30/23): FINDINGS: There is no evidence of an acute fracture or dislocation. Radiopaque surgical screws and fixation wires are seen within the left olecranon process and left radial head. Soft tissues are unremarkable.   IMPRESSION: 1. No acute osseous abnormality. 2. Postoperative changes of the left elbow.   PATIENT SURVEYS:   QuickDASH Score: 68.2 / 100 = 68.2 %  COGNITION: Overall cognitive status: Within functional limits for tasks assessed     SENSATION: WFL- bilateral UE and LE  POSTURE: Pt holds L UE in guarded position at rest. L shoulder in sling upon arrival, but doffs with ease.   UPPER EXTREMITY ROM:   Active ROM Right eval Left PROM eval  Shoulder flexion 155 90  Shoulder extension    Shoulder abduction 120 90  Shoulder adduction    Shoulder internal rotation 75 NT  Shoulder external rotation 50 NT  Elbow flexion    Elbow extension    Wrist flexion    Wrist extension    Wrist ulnar deviation    Wrist radial deviation    Wrist pronation    Wrist supination    (Blank rows = not tested)   UPPER EXTREMITY MMT:  MMT Right eval Left eval  Shoulder flexion 4 NT  Shoulder extension    Shoulder abduction 3+ NT  Shoulder adduction    Shoulder internal rotation    Shoulder external rotation    Middle trapezius    Lower trapezius    Elbow flexion 4 NT  Elbow extension 4 NT  Wrist flexion    Wrist extension    Wrist ulnar deviation    Wrist radial deviation    Wrist pronation    Wrist supination    Grip strength (lbs)    (Blank rows = not tested)    PALPATION:  Pt with expected tenderness around the incision sites and still covered with the bandaging.                                                                                                                                TREATMENT DATE: 02/13/24  TherEx:  Standing scapular retractions in standing, 22.5#, 2x15  Standing shoulder ladder to #28 in forward flexion, #28 in scaption/abduction    TherAct:  Seated UBE with minimal resistance, 3 min forward, 3 min backward, 6 min total with verbal cues for proper body mechanics  Standing forward red physioball rolls into the forward flexion, 2x10  Standing AAROM L shoulder flexion with UE Ranger, 2x10 Standing AAROM L shoulder into horizontal abduction/adduction, 2x10 each direction Standing AAROM L shoulder circles, CW/CCW, 2x10 each direction      PATIENT EDUCATION: Education details: Exercise technique Person educated: Patient Education method: Explanation Education comprehension: verbalized understanding  HOME EXERCISE  PROGRAM: Access Code: [email or web address] URL: [email or web address] Date: 12/11/2023 Prepared by: Sidra Simpers  Exercises - Circular Shoulder Pendulum with Table Support  - 1 x daily - 7 x weekly - 3 sets - 10 reps - Flexion-Extension Shoulder Pendulum with Table Support  - 1 x daily - 7 x weekly - 3 sets - 10 reps - Horizontal Shoulder Pendulum with Table Support  - 1 x daily - 7 x weekly - 3 sets - 10 reps - Supine Scapular Retraction  - 1 x daily - 7 x weekly - 3 sets - 10 reps - Seated Shoulder Shrug Circles AROM Backward  - 1 x daily - 7 x weekly - 3 sets - 10 reps - Wrist AROM Flexion Extension  - 1 x daily - 7 x weekly - 3 sets - 10 reps - Elbow Flexion PROM  - 1 x daily - 7 x weekly - 3 sets - 10 reps - Elbow Extension PROM  - 1 x daily - 7 x weekly - 3 sets - 10 reps - Forearm Pronation PROM  - 1 x daily - 7 x weekly - 3 sets - 10 reps - Forearm Supination PROM  - 1 x daily - 7 x weekly - 3 sets - 10 reps   Access Code: [email or web address]  URL: [email or web address]  Date: 10/05/2023  Prepared by: Marina  Moser  Exercises  -  Standing shoulder flexion wall slides  - 1 x daily - 7 x weekly - 2 sets - 10 reps - 5 hold  - Standing Shoulder Abduction Slides at Wall  - 1 x daily - 7 x weekly - 2 sets - 10 reps - 5 hold  - Standing Isometric Shoulder Flexion with Doorway - Arm Bent  - 1 x daily - 7 x weekly - 2 sets - 10 reps - 5 hold  - Standing Isometric Shoulder Abduction with Doorway - Arm Bent  - 1 x daily - 7 x weekly - 2 sets - 10 reps - 5 hold  - Standing Isometric Shoulder Internal Rotation at Doorway  - 1 x daily - 7 x weekly - 2 sets - 10 reps - 5 hold  - Standing Isometric Shoulder Extension with Doorway - Arm Bent  - 1 x daily - 7 x weekly - 2 sets - 10 reps - 5 hold  - Seated Shoulder Pendulum Exercise  - 1 x daily - 7 x weekly - 2 sets - 10 reps - 5 hold   ASSESSMENT:  CLINICAL IMPRESSION:    Pt performed well with the tasks and is making significant improvement with the ROM and strength portion.  Pt is gently being introduced to resistance training and will continue to benefit from exercises that challenge both of those visit.  Pt does not have any significant pain upon arrival, however is noting increased pin in the R shoulder.   Pt will continue to benefit from skilled therapy to address remaining deficits in order to improve overall QoL and return to PLOF.       OBJECTIVE IMPAIRMENTS: Abnormal gait, decreased activity tolerance, decreased endurance, decreased mobility, difficulty walking, decreased ROM, decreased strength, hypomobility, increased edema, impaired perceived functional ability, impaired flexibility, impaired UE functional use, improper body mechanics, postural dysfunction, and pain.   ACTIVITY LIMITATIONS: carrying, lifting, bending, sitting, standing, squatting, stairs, transfers, bed mobility, bathing, dressing, self feeding, reach over head, hygiene/grooming, locomotion level, and caring for others  PARTICIPATION LIMITATIONS: meal prep, cleaning, laundry, driving, shopping, community  activity, occupation,  and yard work  PERSONAL FACTORS: Age, Fitness, Profession, and 3+ comorbidities: anemia, arthritis (back, L elbow, L hip, L ankle), asthma, DM, GERD, headache, hyperparathyroidism, lymphedema, seizure, sleep apnea are also affecting patient's functional outcome.   REHAB POTENTIAL: Good  CLINICAL DECISION MAKING: Evolving/moderate complexity  EVALUATION COMPLEXITY: Moderate   GOALS: Goals reviewed with patient? Yes  SHORT TERM GOALS: Target date: 10/24/2023  Patient will be independent in home exercise program to improve strength/mobility for better functional independence with ADLs. Baseline:See HEP above; 11/17/2023=Patient able to verbalize and today demonstrate good understanding of HEP especially now since she has received the results from MRI.  Goal status: MET   LONG TERM GOALS: Target date: 11/28/2023  Patient will decrease Quick DASH score by > 8 points demonstrating reduced self-reported upper extremity disability. Baseline: 68.2/100 01/10/24:  Goal status: INITIAL  2.  Patient will improve L shoulder AROM to > 140 degrees of flexion, scaption, and abduction for improved ability to perform overhead activities. Baseline: avoided AROM due to surgical procedure 01/10/24: avoided AROM due to surgical procedure Goal status: INITIAL  3.  Patient will report a worst pain of 6/10 on VAS in L shoulder to improve tolerance with ADLs and reduced symptoms with activities. Baseline: 10/10 at worst 01/10/24: 3/10 at worst Goal status: MET  4.  Patient will be able to tolerate holding her young grandson without an increase in L shoulder pain to show improved L UE function and increase pt's ability to participate in family care. Baseline: Pt is unable to pick up grandson due to precautions of the L shoulder following RTC repair 01/10/24: Unable to do so at this time due to lifting restrictions and AROM restrictions Goal status: ONGOING   PLAN:  PT FREQUENCY:  2x/week  PT DURATION: 8 weeks  PLANNED INTERVENTIONS: 97164- PT Re-evaluation, 97750- Physical Performance Testing, 97110-Therapeutic exercises, 97530- Therapeutic activity, 97112- Neuromuscular re-education, 97535- Self Care, [id number]- Manual therapy, [id number]- Gait training, [id number]- Orthotic Initial, [id number]- Orthotic/Prosthetic subsequent, [phone]- Canalith repositioning, [id number]- Splinting, [id number]- Electrical stimulation (manual), [id number]- Ultrasound, [id number]- Traction (mechanical), [id number]- Parrafin, 20560 (1-2 muscles), 20561 (3+ muscles)- Dry Needling, Patient/Family education, Balance training, Stair training, Taping, Joint mobilization, Spinal mobilization, Scar mobilization, Vestibular training, DME instructions, Cryotherapy, and Moist heat   PLAN FOR NEXT SESSION:   Continue with strengthening within pain tolerable range   Fonda Simpers, PT, DPT Physical Therapist - [facility]  [facility]  02/13/24, 8:07 AM

## 2024-02-15 ENCOUNTER — Ambulatory Visit

## 2024-02-15 ENCOUNTER — Other Ambulatory Visit: Payer: Self-pay

## 2024-02-15 MED ORDER — ONDANSETRON 4 MG PO TBDP
4.0000 mg | ORAL_TABLET | Freq: Three times a day (TID) | ORAL | 3 refills | Status: AC | PRN
Start: 2024-02-15 — End: ?
  Filled 2024-02-15: qty 20, 7d supply, fill #0

## 2024-02-15 MED ORDER — MOUNJARO 7.5 MG/0.5ML ~~LOC~~ SOAJ
7.5000 mg | SUBCUTANEOUS | 11 refills | Status: AC
  Filled 2024-02-15 – 2024-04-04 (×3): qty 2, 28d supply, fill #0

## 2024-02-20 ENCOUNTER — Other Ambulatory Visit: Payer: Self-pay

## 2024-02-20 MED ORDER — FREESTYLE FREEDOM LITE W/DEVICE KIT
PACK | 0 refills | Status: AC
  Filled 2024-02-20: qty 1, 30d supply, fill #0

## 2024-02-20 MED ORDER — FREESTYLE LANCETS MISC
11 refills | Status: AC
  Filled 2024-02-20: qty 100, 90d supply, fill #0

## 2024-02-20 MED ORDER — FREESTYLE LITE TEST VI STRP
ORAL_STRIP | 11 refills | Status: AC
  Filled 2024-02-20: qty 100, 90d supply, fill #0

## 2024-02-27 ENCOUNTER — Ambulatory Visit: Payer: Worker's Compensation

## 2024-02-27 DIAGNOSIS — M6281 Muscle weakness (generalized): Secondary | ICD-10-CM | POA: Insufficient documentation

## 2024-02-27 DIAGNOSIS — M25512 Pain in left shoulder: Secondary | ICD-10-CM | POA: Insufficient documentation

## 2024-02-27 DIAGNOSIS — M25562 Pain in left knee: Secondary | ICD-10-CM | POA: Insufficient documentation

## 2024-02-27 DIAGNOSIS — M25612 Stiffness of left shoulder, not elsewhere classified: Secondary | ICD-10-CM | POA: Insufficient documentation

## 2024-02-27 NOTE — Therapy (Incomplete)
 OUTPATIENT PHYSICAL THERAPY L SHOULDER TREATMENT   Patient Name: Ashley [name] MRN: [id number] DOB:1965-08-09, 58 y.o., female Today's Date: 02/27/2024  END OF SESSION:     Past Medical History:  Diagnosis Date   Allergy    Anemia    Arthritis    back, left elbow, left hip, left ankle    Asthma    Blood transfusion without reported diagnosis    Complication of anesthesia    delayed emergence   Diabetes mellitus without complication (HCC)    diet controlled   Diverticulosis    GERD (gastroesophageal reflux disease)    Headache    History of hiatal hernia    History of kidney stones    Hyperparathyroidism    Lymphedema    PONV (postoperative nausea and vomiting)    S/P subtotal parathyroidectomy    Seizure (HCC)    as a child   Sleep apnea    cannot tolerate mask   Thyroid  nodule 06/20/2018   Varicose vein of leg    Past Surgical History:  Procedure Laterality Date   ACETABULUM FRACTURE SURGERY Left    CESAREAN SECTION     CHOLECYSTECTOMY     COLONOSCOPY WITH PROPOFOL  N/A 05/31/2021   Procedure: COLONOSCOPY WITH PROPOFOL ;  Surgeon: Maryruth Ole DASEN, MD;  Location: [facility];  Service: Endoscopy;  Laterality: N/A;   ELBOW FRACTURE SURGERY  1995   ESOPHAGOGASTRODUODENOSCOPY (EGD) WITH PROPOFOL  N/A 05/31/2021   Procedure: ESOPHAGOGASTRODUODENOSCOPY (EGD) WITH PROPOFOL ;  Surgeon: Maryruth Ole DASEN, MD;  Location: [facility];  Service: Endoscopy;  Laterality: N/A;   FRACTURE SURGERY     left ankle complete repair, left elbow   HERNIA REPAIR     incisional   LAPAROSCOPIC GASTRIC SLEEVE RESECTION  2014   PARATHYROIDECTOMY N/A 10/25/2018   Procedure: PARATHYROIDECTOMY, DIABETIC, SLEEP APNEA;  Surgeon: Marolyn Nest, MD;  Location: [facility];  Service: General;  Laterality: N/A;   TONSILLECTOMY AND ADENOIDECTOMY N/A 12/08/2015   Procedure: TONSILLECTOMY AND ADENOIDECTOMY;  Surgeon: Chinita Hasten, MD;  Location: [facility];  Service: ENT;   Laterality: N/A;   TOTAL HIP ARTHROPLASTY Left 09/22/2022   Procedure: Left posterior total hip arthroplasty;  Surgeon: Lorelle Hussar, MD;  Location: [facility];  Service: Orthopedics;  Laterality: Left;   TUBAL LIGATION     Patient Active Problem List   Diagnosis Date Noted   Osteoarthritis of left hip 09/22/2022   Side pain 08/16/2022   Musculoskeletal pain 08/16/2022   Varicose veins of both legs with edema 01/05/2021   Swelling of limb 12/30/2020   Diabetes (HCC) 12/30/2020   Lymphedema 12/30/2020   S/P subtotal parathyroidectomy 11/08/2018   Hyperparathyroidism 06/20/2018   Thyroid  nodule 06/20/2018   Intractable pain 04/12/2015    PCP:  Ophelia Sage, MD  REFERRING PROVIDER: Norleen Gavel, MD  REFERRING DIAG:  [phone] (ICD-10-CM) - Left shoulder pain  M25.562 (ICD-10-CM) - Left knee pain    THERAPY DIAG:  No diagnosis found.  Rationale for Evaluation and Treatment: Rehabilitation  ONSET DATE: 08/30/23  SUBJECTIVE:  SUBJECTIVE STATEMENT:  ***   Hand dominance: Right  PERTINENT HISTORY: Pt was seen at [facility] ED on 08/30/23 after a fall sustained at work. Pt works in the emergency room and reported that she slipped on a wet spot and fell onto her left shoulder and knee to avoid falling onto her L hip. Per ED note: Physical exam is generally reassuring. Shoulder joint itself has normal mobility and I am not concerned about dislocation. No evidence of clavicular injury. Mild proximal humerus fracture is possible and the patient is concerned about the fact that she has had multiple prior orthopedic injuries/fractures due to relatively minor falls. We will proceed with x-rays and anticipate restricted movement with shoulder sling, RICE, etc. Pt underwent L THA last June and has previous history of elbow  surgery that prevents full movement of L wrist. Pt reports that she wore the sling provided in ED for a couple of weeks after the fall but has not worn it since.  PMH includes anemia, arthritis (back, L elbow, L hip, L ankle), asthma, DM, GERD, headache, hyperparathyroidism, lymphedema, seizure, sleep apnea  PAIN: Reported on L shoulder: Are you having pain? Yes: NPRS scale: 4/10 currently, worst 10/10 with movement, best: 4/10  Pain location: anterior shoulder Pain description: Sharp when it hits. Aggravating factors: reaching overhead/behind head, movement Relieving factors: Voltaren, Biofreeze, lidocaine  patches, rest  PRECAUTIONS: None  RED FLAGS: None   WEIGHT BEARING RESTRICTIONS: No  FALLS:  Has patient fallen in last 6 months? Yes. Number of falls 1- pt reports that this incident is her only fall  LIVING ENVIRONMENT: Lives with: lives with their spouse Lives in: Mobile home Stairs: Yes: External: 4 steps; can reach both Has following equipment at home: Single point cane and Walker - 2 wheeled  OCCUPATION: Works in ED at [employer]- pt reports that the pain hasn't affected her job duties that much because she is R handed  PLOF: Independent  PATIENT GOALS: Pt wants to be able to hold her 34 month old grandson, get range of motion back to use her L arm normally.  NEXT MD VISIT: 10/12/23  OBJECTIVE:  Note: Objective measures were completed at Evaluation unless otherwise noted.  DIAGNOSTIC FINDINGS:  DG Humerus L (08/30/23): FINDINGS: There is no evidence of an acute fracture or dislocation. Radiopaque surgical screws and fixation wires are seen within the left olecranon process and left radial head. Soft tissues are unremarkable.   IMPRESSION: 1. No acute osseous abnormality. 2. Postoperative changes of the left elbow.   PATIENT SURVEYS:   QuickDASH Score: 68.2 / 100 = 68.2 %  COGNITION: Overall cognitive status: Within functional limits for tasks  assessed     SENSATION: WFL- bilateral UE and LE  POSTURE: Pt holds L UE in guarded position at rest. L shoulder in sling upon arrival, but doffs with ease.   UPPER EXTREMITY ROM:   Active ROM Right eval Left PROM eval  Shoulder flexion 155 90  Shoulder extension    Shoulder abduction 120 90  Shoulder adduction    Shoulder internal rotation 75 NT  Shoulder external rotation 50 NT  Elbow flexion    Elbow extension    Wrist flexion    Wrist extension    Wrist ulnar deviation    Wrist radial deviation    Wrist pronation    Wrist supination    (Blank rows = not tested)   UPPER EXTREMITY MMT:  MMT Right eval Left eval  Shoulder flexion 4 NT  Shoulder  extension    Shoulder abduction 3+ NT  Shoulder adduction    Shoulder internal rotation    Shoulder external rotation    Middle trapezius    Lower trapezius    Elbow flexion 4 NT  Elbow extension 4 NT  Wrist flexion    Wrist extension    Wrist ulnar deviation    Wrist radial deviation    Wrist pronation    Wrist supination    Grip strength (lbs)    (Blank rows = not tested)    PALPATION:  Pt with expected tenderness around the incision sites and still covered with the bandaging.                                                                                                                               TREATMENT DATE: 02/27/24  ***  TherEx:  Standing scapular retractions in standing, 22.5#, 2x15  Standing shoulder ladder to #28 in forward flexion, #28 in scaption/abduction    TherAct:  Seated UBE with minimal resistance, 3 min forward, 3 min backward, 6 min total with verbal cues for proper body mechanics  Standing forward red physioball rolls into the forward flexion, 2x10  Standing AAROM L shoulder flexion with UE Ranger, 2x10 Standing AAROM L shoulder into horizontal abduction/adduction, 2x10 each direction Standing AAROM L shoulder circles, CW/CCW, 2x10 each direction      PATIENT  EDUCATION: Education details: Exercise technique Person educated: Patient Education method: Explanation Education comprehension: verbalized understanding  HOME EXERCISE PROGRAM: Access Code: [email or web address] URL: [email or web address] Date: 12/11/2023 Prepared by: Sidra Simpers  Exercises - Circular Shoulder Pendulum with Table Support  - 1 x daily - 7 x weekly - 3 sets - 10 reps - Flexion-Extension Shoulder Pendulum with Table Support  - 1 x daily - 7 x weekly - 3 sets - 10 reps - Horizontal Shoulder Pendulum with Table Support  - 1 x daily - 7 x weekly - 3 sets - 10 reps - Supine Scapular Retraction  - 1 x daily - 7 x weekly - 3 sets - 10 reps - Seated Shoulder Shrug Circles AROM Backward  - 1 x daily - 7 x weekly - 3 sets - 10 reps - Wrist AROM Flexion Extension  - 1 x daily - 7 x weekly - 3 sets - 10 reps - Elbow Flexion PROM  - 1 x daily - 7 x weekly - 3 sets - 10 reps - Elbow Extension PROM  - 1 x daily - 7 x weekly - 3 sets - 10 reps - Forearm Pronation PROM  - 1 x daily - 7 x weekly - 3 sets - 10 reps - Forearm Supination PROM  - 1 x daily - 7 x weekly - 3 sets - 10 reps   Access Code: [email or web address]  URL: [email or web address]  Date: 10/05/2023  Prepared by: Marina  Moser  Exercises  - Standing shoulder flexion wall slides  - 1 x daily -  7 x weekly - 2 sets - 10 reps - 5 hold  - Standing Shoulder Abduction Slides at Wall  - 1 x daily - 7 x weekly - 2 sets - 10 reps - 5 hold  - Standing Isometric Shoulder Flexion with Doorway - Arm Bent  - 1 x daily - 7 x weekly - 2 sets - 10 reps - 5 hold  - Standing Isometric Shoulder Abduction with Doorway - Arm Bent  - 1 x daily - 7 x weekly - 2 sets - 10 reps - 5 hold  - Standing Isometric Shoulder Internal Rotation at Doorway  - 1 x daily - 7 x weekly - 2 sets - 10 reps - 5 hold  - Standing Isometric Shoulder Extension with Doorway - Arm Bent  - 1 x daily - 7 x weekly - 2 sets - 10 reps - 5 hold  - Seated Shoulder  Pendulum Exercise  - 1 x daily - 7 x weekly - 2 sets - 10 reps - 5 hold   ASSESSMENT:  CLINICAL IMPRESSION:    ***     OBJECTIVE IMPAIRMENTS: Abnormal gait, decreased activity tolerance, decreased endurance, decreased mobility, difficulty walking, decreased ROM, decreased strength, hypomobility, increased edema, impaired perceived functional ability, impaired flexibility, impaired UE functional use, improper body mechanics, postural dysfunction, and pain.   ACTIVITY LIMITATIONS: carrying, lifting, bending, sitting, standing, squatting, stairs, transfers, bed mobility, bathing, dressing, self feeding, reach over head, hygiene/grooming, locomotion level, and caring for others  PARTICIPATION LIMITATIONS: meal prep, cleaning, laundry, driving, shopping, community activity, occupation, and yard work  PERSONAL FACTORS: Age, Fitness, Profession, and 3+ comorbidities: anemia, arthritis (back, L elbow, L hip, L ankle), asthma, DM, GERD, headache, hyperparathyroidism, lymphedema, seizure, sleep apnea are also affecting patient's functional outcome.   REHAB POTENTIAL: Good  CLINICAL DECISION MAKING: Evolving/moderate complexity  EVALUATION COMPLEXITY: Moderate   GOALS: Goals reviewed with patient? Yes  SHORT TERM GOALS: Target date: 10/24/2023  Patient will be independent in home exercise program to improve strength/mobility for better functional independence with ADLs. Baseline:See HEP above; 11/17/2023=Patient able to verbalize and today demonstrate good understanding of HEP especially now since she has received the results from MRI.  Goal status: MET   LONG TERM GOALS: Target date: 11/28/2023  Patient will decrease Quick DASH score by > 8 points demonstrating reduced self-reported upper extremity disability. Baseline: 68.2/100 01/10/24:  Goal status: INITIAL  2.  Patient will improve L shoulder AROM to > 140 degrees of flexion, scaption, and abduction for improved ability to perform  overhead activities. Baseline: avoided AROM due to surgical procedure 01/10/24: avoided AROM due to surgical procedure Goal status: INITIAL  3.  Patient will report a worst pain of 6/10 on VAS in L shoulder to improve tolerance with ADLs and reduced symptoms with activities. Baseline: 10/10 at worst 01/10/24: 3/10 at worst Goal status: MET  4.  Patient will be able to tolerate holding her young grandson without an increase in L shoulder pain to show improved L UE function and increase pt's ability to participate in family care. Baseline: Pt is unable to pick up grandson due to precautions of the L shoulder following RTC repair 01/10/24: Unable to do so at this time due to lifting restrictions and AROM restrictions Goal status: ONGOING   PLAN:  PT FREQUENCY: 2x/week  PT DURATION: 8 weeks  PLANNED INTERVENTIONS: 97164- PT Re-evaluation, 97750- Physical Performance Testing, 97110-Therapeutic exercises, 97530- Therapeutic activity, [id number]- Neuromuscular re-education, 97535- Self Care, 02859- Manual therapy, 02883-  Gait training, 02239- Orthotic Initial, [id number]- Orthotic/Prosthetic subsequent, [id number]- Canalith repositioning, 02239- Splinting, 02967- Electrical stimulation (manual), [id number]- Ultrasound, [id number]- Traction (mechanical), [id number]- Parrafin, 79439 (1-2 muscles), 20561 (3+ muscles)- Dry Needling, Patient/Family education, Balance training, Stair training, Taping, Joint mobilization, Spinal mobilization, Scar mobilization, Vestibular training, DME instructions, Cryotherapy, and Moist heat   PLAN FOR NEXT SESSION:  *** Continue with strengthening within pain tolerable range   Fonda Simpers, PT, DPT Physical Therapist - [facility]  [facility]  02/27/24, 7:17 AM

## 2024-02-29 ENCOUNTER — Ambulatory Visit: Payer: Worker's Compensation

## 2024-02-29 DIAGNOSIS — M25562 Pain in left knee: Secondary | ICD-10-CM | POA: Diagnosis present

## 2024-02-29 DIAGNOSIS — M6281 Muscle weakness (generalized): Secondary | ICD-10-CM

## 2024-02-29 DIAGNOSIS — M25612 Stiffness of left shoulder, not elsewhere classified: Secondary | ICD-10-CM | POA: Diagnosis present

## 2024-02-29 DIAGNOSIS — M25512 Pain in left shoulder: Secondary | ICD-10-CM | POA: Diagnosis present

## 2024-02-29 NOTE — Therapy (Signed)
 OUTPATIENT PHYSICAL THERAPY L SHOULDER TREATMENT   Patient Name: Ashley [name] MRN: [id number] DOB:June 03, 1965, 58 y.o., female Today's Date: 02/29/2024  END OF SESSION:   PT End of Session - 02/29/24 0732     Visit Number 19    Number of Visits 24    Date for Recertification  03/04/24    PT Start Time 0729    PT Stop Time 0800    PT Time Calculation (min) 31 min    Activity Tolerance Patient tolerated treatment well;Patient limited by pain    Behavior During Therapy [facility] for tasks assessed/performed         Past Medical History:  Diagnosis Date   Allergy    Anemia    Arthritis    back, left elbow, left hip, left ankle    Asthma    Blood transfusion without reported diagnosis    Complication of anesthesia    delayed emergence   Diabetes mellitus without complication (HCC)    diet controlled   Diverticulosis    GERD (gastroesophageal reflux disease)    Headache    History of hiatal hernia    History of kidney stones    Hyperparathyroidism    Lymphedema    PONV (postoperative nausea and vomiting)    S/P subtotal parathyroidectomy    Seizure (HCC)    as a child   Sleep apnea    cannot tolerate mask   Thyroid  nodule 06/20/2018   Varicose vein of leg    Past Surgical History:  Procedure Laterality Date   ACETABULUM FRACTURE SURGERY Left    CESAREAN SECTION     CHOLECYSTECTOMY     COLONOSCOPY WITH PROPOFOL  N/A 05/31/2021   Procedure: COLONOSCOPY WITH PROPOFOL ;  Surgeon: Maryruth Ole DASEN, MD;  Location: [facility] ENDOSCOPY;  Service: Endoscopy;  Laterality: N/A;   ELBOW FRACTURE SURGERY  1995   ESOPHAGOGASTRODUODENOSCOPY (EGD) WITH PROPOFOL  N/A 05/31/2021   Procedure: ESOPHAGOGASTRODUODENOSCOPY (EGD) WITH PROPOFOL ;  Surgeon: Maryruth Ole DASEN, MD;  Location: [facility] ENDOSCOPY;  Service: Endoscopy;  Laterality: N/A;   FRACTURE SURGERY     left ankle complete repair, left elbow   HERNIA REPAIR     incisional   LAPAROSCOPIC GASTRIC SLEEVE RESECTION  2014    PARATHYROIDECTOMY N/A 10/25/2018   Procedure: PARATHYROIDECTOMY, DIABETIC, SLEEP APNEA;  Surgeon: Marolyn Nest, MD;  Location: [facility] ORS;  Service: General;  Laterality: N/A;   TONSILLECTOMY AND ADENOIDECTOMY N/A 12/08/2015   Procedure: TONSILLECTOMY AND ADENOIDECTOMY;  Surgeon: Chinita Hasten, MD;  Location: [facility] ORS;  Service: ENT;  Laterality: N/A;   TOTAL HIP ARTHROPLASTY Left 09/22/2022   Procedure: Left posterior total hip arthroplasty;  Surgeon: Lorelle Hussar, MD;  Location: [facility] ORS;  Service: Orthopedics;  Laterality: Left;   TUBAL LIGATION     Patient Active Problem List   Diagnosis Date Noted   Osteoarthritis of left hip 09/22/2022   Side pain 08/16/2022   Musculoskeletal pain 08/16/2022   Varicose veins of both legs with edema 01/05/2021   Swelling of limb 12/30/2020   Diabetes (HCC) 12/30/2020   Lymphedema 12/30/2020   S/P subtotal parathyroidectomy 11/08/2018   Hyperparathyroidism 06/20/2018   Thyroid  nodule 06/20/2018   Intractable pain 04/12/2015    PCP:  Ophelia Sage, MD  REFERRING PROVIDER: Norleen Gavel, MD  REFERRING DIAG:  [phone] (ICD-10-CM) - Left shoulder pain  M25.562 (ICD-10-CM) - Left knee pain    THERAPY DIAG:  Acute pain of left shoulder  Decreased ROM of left shoulder  Muscle weakness (generalized)  Acute pain  of left knee  Rationale for Evaluation and Treatment: Rehabilitation  ONSET DATE: 08/30/23  SUBJECTIVE:                                                                                                                                                                                      SUBJECTIVE STATEMENT:  Pt reports that she's having 10/10 pain upon arrival, however after telling her that she needed to consult her MD about the pain, she stated she was having at least a 5/10 pain.   Pt also showed up 14 minutes late.   Hand dominance: Right  PERTINENT HISTORY: Pt was seen at [facility] ED on 08/30/23 after a fall sustained at  work. Pt works in the emergency room and reported that she slipped on a wet spot and fell onto her left shoulder and knee to avoid falling onto her L hip. Per ED note: Physical exam is generally reassuring. Shoulder joint itself has normal mobility and I am not concerned about dislocation. No evidence of clavicular injury. Mild proximal humerus fracture is possible and the patient is concerned about the fact that she has had multiple prior orthopedic injuries/fractures due to relatively minor falls. We will proceed with x-rays and anticipate restricted movement with shoulder sling, RICE, etc. Pt underwent L THA last June and has previous history of elbow surgery that prevents full movement of L wrist. Pt reports that she wore the sling provided in ED for a couple of weeks after the fall but has not worn it since.  PMH includes anemia, arthritis (back, L elbow, L hip, L ankle), asthma, DM, GERD, headache, hyperparathyroidism, lymphedema, seizure, sleep apnea  PAIN: Reported on L shoulder: Are you having pain? Yes: NPRS scale: 4/10 currently, worst 10/10 with movement, best: 4/10  Pain location: anterior shoulder Pain description: Sharp when it hits. Aggravating factors: reaching overhead/behind head, movement Relieving factors: Voltaren, Biofreeze, lidocaine  patches, rest  PRECAUTIONS: None  RED FLAGS: None   WEIGHT BEARING RESTRICTIONS: No  FALLS:  Has patient fallen in last 6 months? Yes. Number of falls 1- pt reports that this incident is her only fall  LIVING ENVIRONMENT: Lives with: lives with their spouse Lives in: Mobile home Stairs: Yes: External: 4 steps; can reach both Has following equipment at home: Single point cane and Walker - 2 wheeled  OCCUPATION: Works in ED at [employer]- pt reports that the pain hasn't affected her job duties that much because she is R handed  PLOF: Independent  PATIENT GOALS: Pt wants to be able to hold her 14 month old grandson, get range of motion  back to use her L arm normally.  NEXT MD  VISIT: 10/12/23  OBJECTIVE:  Note: Objective measures were completed at Evaluation unless otherwise noted.  DIAGNOSTIC FINDINGS:  DG Humerus L (08/30/23): FINDINGS: There is no evidence of an acute fracture or dislocation. Radiopaque surgical screws and fixation wires are seen within the left olecranon process and left radial head. Soft tissues are unremarkable.   IMPRESSION: 1. No acute osseous abnormality. 2. Postoperative changes of the left elbow.   PATIENT SURVEYS:   QuickDASH Score: 68.2 / 100 = 68.2 %  COGNITION: Overall cognitive status: Within functional limits for tasks assessed     SENSATION: WFL- bilateral UE and LE  POSTURE: Pt holds L UE in guarded position at rest. L shoulder in sling upon arrival, but doffs with ease.   UPPER EXTREMITY ROM:   Active ROM Right eval Left PROM eval  Shoulder flexion 155 90  Shoulder extension    Shoulder abduction 120 90  Shoulder adduction    Shoulder internal rotation 75 NT  Shoulder external rotation 50 NT  Elbow flexion    Elbow extension    Wrist flexion    Wrist extension    Wrist ulnar deviation    Wrist radial deviation    Wrist pronation    Wrist supination    (Blank rows = not tested)   UPPER EXTREMITY MMT:  MMT Right eval Left eval  Shoulder flexion 4 NT  Shoulder extension    Shoulder abduction 3+ NT  Shoulder adduction    Shoulder internal rotation    Shoulder external rotation    Middle trapezius    Lower trapezius    Elbow flexion 4 NT  Elbow extension 4 NT  Wrist flexion    Wrist extension    Wrist ulnar deviation    Wrist radial deviation    Wrist pronation    Wrist supination    Grip strength (lbs)    (Blank rows = not tested)    PALPATION:  Pt with expected tenderness around the incision sites and still covered with the bandaging.                                                                                                                                TREATMENT DATE: 02/29/24  TherEx:  Standing scapular retractions in standing, 12.5#, 2x15  Standing shoulder ladder to #28 in forward flexion, #29 in scaption/abduction   TherAct:  Seated UBE with minimal resistance, 3 min forward, 3 min backward, 6 min total with verbal cues for proper body mechanics  Standing forward green physioball rolls into the forward flexion, 2x10  Standing with back against wall, 138 deg forward flexion of the L arm, x5 reps Standing with back against wall, 92 deg abduction of the L arm, x5 reps   PATIENT EDUCATION: Education details: Exercise technique Person educated: Patient Education method: Explanation Education comprehension: verbalized understanding  HOME EXERCISE PROGRAM: Access Code: [email or web address] URL: [email or web address] Date: 12/11/2023 Prepared by: Sidra Simpers  Exercises -  Circular Shoulder Pendulum with Table Support  - 1 x daily - 7 x weekly - 3 sets - 10 reps - Flexion-Extension Shoulder Pendulum with Table Support  - 1 x daily - 7 x weekly - 3 sets - 10 reps - Horizontal Shoulder Pendulum with Table Support  - 1 x daily - 7 x weekly - 3 sets - 10 reps - Supine Scapular Retraction  - 1 x daily - 7 x weekly - 3 sets - 10 reps - Seated Shoulder Shrug Circles AROM Backward  - 1 x daily - 7 x weekly - 3 sets - 10 reps - Wrist AROM Flexion Extension  - 1 x daily - 7 x weekly - 3 sets - 10 reps - Elbow Flexion PROM  - 1 x daily - 7 x weekly - 3 sets - 10 reps - Elbow Extension PROM  - 1 x daily - 7 x weekly - 3 sets - 10 reps - Forearm Pronation PROM  - 1 x daily - 7 x weekly - 3 sets - 10 reps - Forearm Supination PROM  - 1 x daily - 7 x weekly - 3 sets - 10 reps   Access Code: [email or web address]  URL: [email or web address]  Date: 10/05/2023  Prepared by: Chesley Custard  Exercises  - Standing shoulder flexion wall slides  - 1 x daily - 7 x weekly - 2 sets - 10 reps - 5 hold  - Standing Shoulder  Abduction Slides at Wall  - 1 x daily - 7 x weekly - 2 sets - 10 reps - 5 hold  - Standing Isometric Shoulder Flexion with Doorway - Arm Bent  - 1 x daily - 7 x weekly - 2 sets - 10 reps - 5 hold  - Standing Isometric Shoulder Abduction with Doorway - Arm Bent  - 1 x daily - 7 x weekly - 2 sets - 10 reps - 5 hold  - Standing Isometric Shoulder Internal Rotation at Doorway  - 1 x daily - 7 x weekly - 2 sets - 10 reps - 5 hold  - Standing Isometric Shoulder Extension with Doorway - Arm Bent  - 1 x daily - 7 x weekly - 2 sets - 10 reps - 5 hold  - Seated Shoulder Pendulum Exercise  - 1 x daily - 7 x weekly - 2 sets - 10 reps - 5 hold   ASSESSMENT:  CLINICAL IMPRESSION:    Pt continues to be late to appointment and limited with therapy sessions.  Pt noting 10/10 pain, however when advised that 10/10 pain warrants a trip to the ED, pt immediately noted her pain to be >5/10.  When pt was educated on the increase in pain warranting a discussion with MD and a call to move up her follow-up appointment, pt then stating it's not pain, but more soreness.  Pt seems to have difficulty with describing her pain at this time, however she does report the pain to have been happening for 2 weeks now.  Pt should be having a reduction in pain at this point in time with mobility of the shoulder.  Pt denies any mechanism of injury and has good AROM flexion, but lacking significant AROM for abduction of the L shoulder.  Pt advised to contact Md if the pain continues to be persistent over the next day and pt agreeable to this.  Pt otherwise will continue to work on strength within tolerable ROM.   Pt will continue to benefit from skilled  therapy to address remaining deficits in order to improve overall QoL and return to PLOF.        OBJECTIVE IMPAIRMENTS: Abnormal gait, decreased activity tolerance, decreased endurance, decreased mobility, difficulty walking, decreased ROM, decreased strength, hypomobility, increased edema,  impaired perceived functional ability, impaired flexibility, impaired UE functional use, improper body mechanics, postural dysfunction, and pain.   ACTIVITY LIMITATIONS: carrying, lifting, bending, sitting, standing, squatting, stairs, transfers, bed mobility, bathing, dressing, self feeding, reach over head, hygiene/grooming, locomotion level, and caring for others  PARTICIPATION LIMITATIONS: meal prep, cleaning, laundry, driving, shopping, community activity, occupation, and yard work  PERSONAL FACTORS: Age, Fitness, Profession, and 3+ comorbidities: anemia, arthritis (back, L elbow, L hip, L ankle), asthma, DM, GERD, headache, hyperparathyroidism, lymphedema, seizure, sleep apnea are also affecting patient's functional outcome.   REHAB POTENTIAL: Good  CLINICAL DECISION MAKING: Evolving/moderate complexity  EVALUATION COMPLEXITY: Moderate   GOALS: Goals reviewed with patient? Yes  SHORT TERM GOALS: Target date: 10/24/2023  Patient will be independent in home exercise program to improve strength/mobility for better functional independence with ADLs. Baseline:See HEP above; 11/17/2023=Patient able to verbalize and today demonstrate good understanding of HEP especially now since she has received the results from MRI.  Goal status: MET   LONG TERM GOALS: Target date: 11/28/2023  Patient will decrease Quick DASH score by > 8 points demonstrating reduced self-reported upper extremity disability. Baseline: 68.2/100 01/10/24:  Goal status: INITIAL  2.  Patient will improve L shoulder AROM to > 140 degrees of flexion, scaption, and abduction for improved ability to perform overhead activities. Baseline: avoided AROM due to surgical procedure 01/10/24: avoided AROM due to surgical procedure Goal status: INITIAL  3.  Patient will report a worst pain of 6/10 on VAS in L shoulder to improve tolerance with ADLs and reduced symptoms with activities. Baseline: 10/10 at worst 01/10/24: 3/10 at  worst Goal status: MET  4.  Patient will be able to tolerate holding her young grandson without an increase in L shoulder pain to show improved L UE function and increase pt's ability to participate in family care. Baseline: Pt is unable to pick up grandson due to precautions of the L shoulder following RTC repair 01/10/24: Unable to do so at this time due to lifting restrictions and AROM restrictions Goal status: ONGOING   PLAN:  PT FREQUENCY: 2x/week  PT DURATION: 8 weeks  PLANNED INTERVENTIONS: [id number]- PT Re-evaluation, 97750- Physical Performance Testing, 97110-Therapeutic exercises, 97530- Therapeutic activity, 97112- Neuromuscular re-education, 97535- Self Care, 02859- Manual therapy, [id number]- Gait training, [id number]- Orthotic Initial, [id number]- Orthotic/Prosthetic subsequent, [id number]- Canalith repositioning, [id number]- Splinting, [id number]- Electrical stimulation (manual), [id number]- Ultrasound, [id number]- Traction (mechanical), [id number]- Parrafin, 20560 (1-2 muscles), 20561 (3+ muscles)- Dry Needling, Patient/Family education, Balance training, Stair training, Taping, Joint mobilization, Spinal mobilization, Scar mobilization, Vestibular training, DME instructions, Cryotherapy, and Moist heat   PLAN FOR NEXT SESSION:   Continue with strengthening within pain tolerable range PN!  Fonda Simpers, PT, DPT Physical Therapist - [facility]  02/29/24, 9:27 AM

## 2024-03-05 ENCOUNTER — Ambulatory Visit: Payer: Worker's Compensation

## 2024-03-05 DIAGNOSIS — M25512 Pain in left shoulder: Secondary | ICD-10-CM | POA: Diagnosis not present

## 2024-03-05 DIAGNOSIS — M25562 Pain in left knee: Secondary | ICD-10-CM

## 2024-03-05 DIAGNOSIS — M6281 Muscle weakness (generalized): Secondary | ICD-10-CM

## 2024-03-05 DIAGNOSIS — M25612 Stiffness of left shoulder, not elsewhere classified: Secondary | ICD-10-CM

## 2024-03-05 NOTE — Therapy (Signed)
 OUTPATIENT PHYSICAL THERAPY L SHOULDER TREATMENT/PHYSICAL THERAPY PROGRESS NOTE   Dates of reporting period  01/10/24   to   03/05/24    Patient Name: Ashley [name] MRN: [id number] DOB:[birth year], 58 y.o., female Today's Date: 03/05/2024  END OF SESSION:   PT End of Session - 03/05/24 0723     Visit Number 20    Number of Visits 24    Date for Recertification  03/04/24    PT Start Time 0722    PT Stop Time 0800    PT Time Calculation (min) 38 min    Activity Tolerance Patient tolerated treatment well;Patient limited by pain    Behavior During Therapy [facility] for tasks assessed/performed          Past Medical History:  Diagnosis Date   Allergy    Anemia    Arthritis    back, left elbow, left hip, left ankle    Asthma    Blood transfusion without reported diagnosis    Complication of anesthesia    delayed emergence   Diabetes mellitus without complication (HCC)    diet controlled   Diverticulosis    GERD (gastroesophageal reflux disease)    Headache    History of hiatal hernia    History of kidney stones    Hyperparathyroidism    Lymphedema    PONV (postoperative nausea and vomiting)    S/P subtotal parathyroidectomy    Seizure (HCC)    as a child   Sleep apnea    cannot tolerate mask   Thyroid  nodule 06/20/2018   Varicose vein of leg    Past Surgical History:  Procedure Laterality Date   ACETABULUM FRACTURE SURGERY Left    CESAREAN SECTION     CHOLECYSTECTOMY     COLONOSCOPY WITH PROPOFOL  N/A 05/31/2021   Procedure: COLONOSCOPY WITH PROPOFOL ;  Surgeon: Maryruth Ole DASEN, MD;  Location: [facility] ENDOSCOPY;  Service: Endoscopy;  Laterality: N/A;   ELBOW FRACTURE SURGERY  1995   ESOPHAGOGASTRODUODENOSCOPY (EGD) WITH PROPOFOL  N/A 05/31/2021   Procedure: ESOPHAGOGASTRODUODENOSCOPY (EGD) WITH PROPOFOL ;  Surgeon: Maryruth Ole DASEN, MD;  Location: [facility] ENDOSCOPY;  Service: Endoscopy;  Laterality: N/A;   FRACTURE SURGERY     left ankle complete repair,  left elbow   HERNIA REPAIR     incisional   LAPAROSCOPIC GASTRIC SLEEVE RESECTION  2014   PARATHYROIDECTOMY N/A 10/25/2018   Procedure: PARATHYROIDECTOMY, DIABETIC, SLEEP APNEA;  Surgeon: Marolyn Nest, MD;  Location: [facility] ORS;  Service: General;  Laterality: N/A;   TONSILLECTOMY AND ADENOIDECTOMY N/A 12/08/2015   Procedure: TONSILLECTOMY AND ADENOIDECTOMY;  Surgeon: Chinita Hasten, MD;  Location: [facility] ORS;  Service: ENT;  Laterality: N/A;   TOTAL HIP ARTHROPLASTY Left 09/22/2022   Procedure: Left posterior total hip arthroplasty;  Surgeon: Lorelle Hussar, MD;  Location: [facility] ORS;  Service: Orthopedics;  Laterality: Left;   TUBAL LIGATION     Patient Active Problem List   Diagnosis Date Noted   Osteoarthritis of left hip 09/22/2022   Side pain 08/16/2022   Musculoskeletal pain 08/16/2022   Varicose veins of both legs with edema 01/05/2021   Swelling of limb 12/30/2020   Diabetes (HCC) 12/30/2020   Lymphedema 12/30/2020   S/P subtotal parathyroidectomy 11/08/2018   Hyperparathyroidism 06/20/2018   Thyroid  nodule 06/20/2018   Intractable pain 04/12/2015    PCP:  Ophelia Sage, MD  REFERRING PROVIDER: Norleen Gavel, MD  REFERRING DIAG:  [phone] (ICD-10-CM) - Left shoulder pain  M25.562 (ICD-10-CM) - Left knee pain    THERAPY DIAG:  Acute pain of left shoulder  Decreased ROM of left shoulder  Muscle weakness (generalized)  Acute pain of left knee  Rationale for Evaluation and Treatment: Rehabilitation  ONSET DATE: 08/30/23  SUBJECTIVE:                                                                                                                                                                                      SUBJECTIVE STATEMENT:  Pt reports that she never consulted her physician after the other day when her shoulder was in pain.  Pt notes that the pain is all the time and she was advised that she should contact the MD if it continues to be 10/10.  Pt reports that  she may have exaggerated the pain at the last visit.   Hand dominance: Right  PERTINENT HISTORY: Pt was seen at [facility] ED on 08/30/23 after a fall sustained at work. Pt works in the emergency room and reported that she slipped on a wet spot and fell onto her left shoulder and knee to avoid falling onto her L hip. Per ED note: Physical exam is generally reassuring. Shoulder joint itself has normal mobility and I am not concerned about dislocation. No evidence of clavicular injury. Mild proximal humerus fracture is possible and the patient is concerned about the fact that she has had multiple prior orthopedic injuries/fractures due to relatively minor falls. We will proceed with x-rays and anticipate restricted movement with shoulder sling, RICE, etc. Pt underwent L THA last June and has previous history of elbow surgery that prevents full movement of L wrist. Pt reports that she wore the sling provided in ED for a couple of weeks after the fall but has not worn it since.  PMH includes anemia, arthritis (back, L elbow, L hip, L ankle), asthma, DM, GERD, headache, hyperparathyroidism, lymphedema, seizure, sleep apnea  PAIN: Reported on L shoulder: Are you having pain? Yes: NPRS scale: 4/10 currently, worst 10/10 with movement, best: 4/10  Pain location: anterior shoulder Pain description: Sharp when it hits. Aggravating factors: reaching overhead/behind head, movement Relieving factors: Voltaren, Biofreeze, lidocaine  patches, rest  PRECAUTIONS: None  RED FLAGS: None   WEIGHT BEARING RESTRICTIONS: No  FALLS:  Has patient fallen in last 6 months? Yes. Number of falls 1- pt reports that this incident is her only fall  LIVING ENVIRONMENT: Lives with: lives with their spouse Lives in: Mobile home Stairs: Yes: External: 4 steps; can reach both Has following equipment at home: Single point cane and Walker - 2 wheeled  OCCUPATION: Works in ED at [employer]- pt reports that the pain hasn't affected her  job duties that much because she is  R handed  PLOF: Independent  PATIENT GOALS:  Pt wants to be able to hold her 75 month old grandson, get range of motion back to use her L arm normally.  NEXT MD VISIT: 10/12/23  OBJECTIVE:  Note: Objective measures were completed at Evaluation unless otherwise noted.  DIAGNOSTIC FINDINGS:  DG Humerus L (08/30/23): FINDINGS: There is no evidence of an acute fracture or dislocation. Radiopaque surgical screws and fixation wires are seen within the left olecranon process and left radial head. Soft tissues are unremarkable.   IMPRESSION: 1. No acute osseous abnormality. 2. Postoperative changes of the left elbow.   PATIENT SURVEYS:   QuickDASH Score: 68.2 / 100 = 68.2 %  COGNITION: Overall cognitive status: Within functional limits for tasks assessed     SENSATION: WFL- bilateral UE and LE  POSTURE: Pt holds L UE in guarded position at rest. L shoulder in sling upon arrival, but doffs with ease.   UPPER EXTREMITY ROM:   Active ROM Right eval Left PROM eval  Shoulder flexion 155 90  Shoulder extension    Shoulder abduction 120 90  Shoulder adduction    Shoulder internal rotation 75 NT  Shoulder external rotation 50 NT  Elbow flexion    Elbow extension    Wrist flexion    Wrist extension    Wrist ulnar deviation    Wrist radial deviation    Wrist pronation    Wrist supination    (Blank rows = not tested)   UPPER EXTREMITY MMT:  MMT Right eval Left eval  Shoulder flexion 4 NT  Shoulder extension    Shoulder abduction 3+ NT  Shoulder adduction    Shoulder internal rotation    Shoulder external rotation    Middle trapezius    Lower trapezius    Elbow flexion 4 NT  Elbow extension 4 NT  Wrist flexion    Wrist extension    Wrist ulnar deviation    Wrist radial deviation    Wrist pronation    Wrist supination    Grip strength (lbs)    (Blank rows = not tested)    PALPATION:  Pt with expected tenderness around  the incision sites and still covered with the bandaging.     TREATMENT DATE: 03/05/24   TherEx:  Seated shoulder pulleys into flexion and abduction, 2 min each direction  Standing scapular retractions in standing, 12.5#, 2x15  Standing shoulder ladder to #28 in forward flexion, #29 in scaption/abduction, x10 each direction   TherAct:  Standing wall slides, 2x10  Standing forward green physioball rolls into the forward flexion, 2x10  Standing with back against wall, x15 reps Standing with back against wall, x15 reps  Goal assessment performed and noted below:   PATIENT EDUCATION: Education details: Exercise technique Person educated: Patient Education method: Explanation Education comprehension: verbalized understanding  HOME EXERCISE PROGRAM: Access Code: [email or web address] URL: [email or web address] Date: 12/11/2023 Prepared by: Sidra Simpers  Exercises - Circular Shoulder Pendulum with Table Support  - 1 x daily - 7 x weekly - 3 sets - 10 reps - Flexion-Extension Shoulder Pendulum with Table Support  - 1 x daily - 7 x weekly - 3 sets - 10 reps - Horizontal Shoulder Pendulum with Table Support  - 1 x daily - 7 x weekly - 3 sets - 10 reps - Supine Scapular Retraction  - 1 x daily - 7 x weekly - 3 sets - 10 reps - Seated Shoulder Shrug Circles AROM Backward  - 1 x daily -  7 x weekly - 3 sets - 10 reps - Wrist AROM Flexion Extension  - 1 x daily - 7 x weekly - 3 sets - 10 reps - Elbow Flexion PROM  - 1 x daily - 7 x weekly - 3 sets - 10 reps - Elbow Extension PROM  - 1 x daily - 7 x weekly - 3 sets - 10 reps - Forearm Pronation PROM  - 1 x daily - 7 x weekly - 3 sets - 10 reps - Forearm Supination PROM  - 1 x daily - 7 x weekly - 3 sets - 10 reps   Access Code: [email or web address]  URL: [email or web address]  Date: 10/05/2023  Prepared by: Marina  Moser  Exercises  - Standing shoulder flexion wall slides  - 1 x daily - 7 x weekly - 2 sets - 10 reps - 5 hold   - Standing Shoulder Abduction Slides at Wall  - 1 x daily - 7 x weekly - 2 sets - 10 reps - 5 hold  - Standing Isometric Shoulder Flexion with Doorway - Arm Bent  - 1 x daily - 7 x weekly - 2 sets - 10 reps - 5 hold  - Standing Isometric Shoulder Abduction with Doorway - Arm Bent  - 1 x daily - 7 x weekly - 2 sets - 10 reps - 5 hold  - Standing Isometric Shoulder Internal Rotation at Doorway  - 1 x daily - 7 x weekly - 2 sets - 10 reps - 5 hold  - Standing Isometric Shoulder Extension with Doorway - Arm Bent  - 1 x daily - 7 x weekly - 2 sets - 10 reps - 5 hold  - Seated Shoulder Pendulum Exercise  - 1 x daily - 7 x weekly - 2 sets - 10 reps - 5 hold   ASSESSMENT:  CLINICAL IMPRESSION:    Pt responded well and is progressing with goals at this time.  Pt still having residual pain, however seems to have difficulty differentiating between pain and soreness.  Pt ultimately has made improvements in her ability to perform ROM actively and the strength in her shoulder when moving against gravity.   Pt will continue to benefit from skilled therapy to address remaining deficits in order to improve overall QoL and return to PLOF.         OBJECTIVE IMPAIRMENTS: Abnormal gait, decreased activity tolerance, decreased endurance, decreased mobility, difficulty walking, decreased ROM, decreased strength, hypomobility, increased edema, impaired perceived functional ability, impaired flexibility, impaired UE functional use, improper body mechanics, postural dysfunction, and pain.   ACTIVITY LIMITATIONS: carrying, lifting, bending, sitting, standing, squatting, stairs, transfers, bed mobility, bathing, dressing, self feeding, reach over head, hygiene/grooming, locomotion level, and caring for others  PARTICIPATION LIMITATIONS: meal prep, cleaning, laundry, driving, shopping, community activity, occupation, and yard work  PERSONAL FACTORS: Age, Fitness, Profession, and 3+ comorbidities: anemia, arthritis  (back, L elbow, L hip, L ankle), asthma, DM, GERD, headache, hyperparathyroidism, lymphedema, seizure, sleep apnea are also affecting patient's functional outcome.   REHAB POTENTIAL: Good  CLINICAL DECISION MAKING: Evolving/moderate complexity  EVALUATION COMPLEXITY: Moderate   GOALS: Goals reviewed with patient? Yes  SHORT TERM GOALS: Target date: 10/24/2023  Patient will be independent in home exercise program to improve strength/mobility for better functional independence with ADLs. Baseline:See HEP above; 11/17/2023=Patient able to verbalize and today demonstrate good understanding of HEP especially now since she has received the results from MRI.  Goal status: MET   LONG TERM GOALS: Target  date: 11/28/2023  Patient will decrease Quick DASH score by > 8 points demonstrating reduced self-reported upper extremity disability. Baseline: 68.2/100 03/05/24: 77.3/100 Goal status: PROGRESSING  2.  Patient will improve L shoulder AROM to > 140 degrees of flexion, scaption, and abduction for improved ability to perform overhead activities. Baseline: avoided AROM due to surgical procedure 01/10/24: avoided AROM due to surgical procedure 03/05/24: 103 deg flexion AROM, 98 deg abduction Goal status: PROGRESSING  3.  Patient will report a worst pain of 6/10 on VAS in L shoulder to improve tolerance with ADLs and reduced symptoms with activities. Baseline: 10/10 at worst 01/10/24: 3/10 at worst 03/05/24: 10/10 at last visit Goal status: NOT MET  4.  Patient will be able to tolerate holding her young grandson without an increase in L shoulder pain to show improved L UE function and increase pt's ability to participate in family care. Baseline: Pt is unable to pick up grandson due to precautions of the L shoulder following RTC repair 01/10/24: Unable to do so at this time due to lifting restrictions and AROM restrictions 03/05/24: Pt was able to hold her grandson, but only able to hold him on her  R side.   Goal status: ONGOING   PLAN:  PT FREQUENCY: 2x/week  PT DURATION: 8 weeks  PLANNED INTERVENTIONS: [id number]- PT Re-evaluation, 97750- Physical Performance Testing, 97110-Therapeutic exercises, 97530- Therapeutic activity, [id number]- Neuromuscular re-education, 97535- Self Care, [id number]- Manual therapy, [id number]- Gait training, [id number]- Orthotic Initial, [id number]- Orthotic/Prosthetic subsequent, [phone]- Canalith repositioning, [id number]- Splinting, [id number]- Electrical stimulation (manual), [id number]- Ultrasound, [id number]- Traction (mechanical), [id number]- Parrafin, [id number] (1-2 muscles), [id number] (3+ muscles)- Dry Needling, Patient/Family education, Balance training, Stair training, Taping, Joint mobilization, Spinal mobilization, Scar mobilization, Vestibular training, DME instructions, Cryotherapy, and Moist heat   PLAN FOR NEXT SESSION:   Continue with strengthening within pain tolerable range PN!  Fonda Simpers, PT, DPT Physical Therapist - [facility]  03/05/24, 8:10 AM

## 2024-03-07 ENCOUNTER — Ambulatory Visit

## 2024-03-11 ENCOUNTER — Ambulatory Visit: Payer: Worker's Compensation

## 2024-03-11 DIAGNOSIS — M25512 Pain in left shoulder: Secondary | ICD-10-CM | POA: Diagnosis not present

## 2024-03-11 DIAGNOSIS — M6281 Muscle weakness (generalized): Secondary | ICD-10-CM

## 2024-03-11 DIAGNOSIS — M25612 Stiffness of left shoulder, not elsewhere classified: Secondary | ICD-10-CM

## 2024-03-11 NOTE — Therapy (Signed)
 OUTPATIENT PHYSICAL THERAPY L SHOULDER TREATMENT/PHYSICAL THERAPY RECERT    Patient Name: Ashley [name] MRN: [id number] DOB:16-Nov-1965, 58 y.o., female Today's Date: 03/11/2024  END OF SESSION:   PT End of Session - 03/11/24 0802     Visit Number 21    Number of Visits 24    Date for Recertification  05/06/24    PT Start Time 0800    PT Stop Time 0844    PT Time Calculation (min) 44 min    Activity Tolerance Patient tolerated treatment well;Patient limited by pain    Behavior During Therapy [facility] for tasks assessed/performed           Past Medical History:  Diagnosis Date   Allergy    Anemia    Arthritis    back, left elbow, left hip, left ankle    Asthma    Blood transfusion without reported diagnosis    Complication of anesthesia    delayed emergence   Diabetes mellitus without complication (HCC)    diet controlled   Diverticulosis    GERD (gastroesophageal reflux disease)    Headache    History of hiatal hernia    History of kidney stones    Hyperparathyroidism    Lymphedema    PONV (postoperative nausea and vomiting)    S/P subtotal parathyroidectomy    Seizure (HCC)    as a child   Sleep apnea    cannot tolerate mask   Thyroid  nodule 06/20/2018   Varicose vein of leg    Past Surgical History:  Procedure Laterality Date   ACETABULUM FRACTURE SURGERY Left    CESAREAN SECTION     CHOLECYSTECTOMY     COLONOSCOPY WITH PROPOFOL  N/A 05/31/2021   Procedure: COLONOSCOPY WITH PROPOFOL ;  Surgeon: Maryruth Ole DASEN, MD;  Location: [facility] ENDOSCOPY;  Service: Endoscopy;  Laterality: N/A;   ELBOW FRACTURE SURGERY  1995   ESOPHAGOGASTRODUODENOSCOPY (EGD) WITH PROPOFOL  N/A 05/31/2021   Procedure: ESOPHAGOGASTRODUODENOSCOPY (EGD) WITH PROPOFOL ;  Surgeon: Maryruth Ole DASEN, MD;  Location: [facility] ENDOSCOPY;  Service: Endoscopy;  Laterality: N/A;   FRACTURE SURGERY     left ankle complete repair, left elbow   HERNIA REPAIR     incisional   LAPAROSCOPIC  GASTRIC SLEEVE RESECTION  2014   PARATHYROIDECTOMY N/A 10/25/2018   Procedure: PARATHYROIDECTOMY, DIABETIC, SLEEP APNEA;  Surgeon: Marolyn Nest, MD;  Location: [facility] ORS;  Service: General;  Laterality: N/A;   TONSILLECTOMY AND ADENOIDECTOMY N/A 12/08/2015   Procedure: TONSILLECTOMY AND ADENOIDECTOMY;  Surgeon: Chinita Hasten, MD;  Location: [facility] ORS;  Service: ENT;  Laterality: N/A;   TOTAL HIP ARTHROPLASTY Left 09/22/2022   Procedure: Left posterior total hip arthroplasty;  Surgeon: Lorelle Hussar, MD;  Location: [facility] ORS;  Service: Orthopedics;  Laterality: Left;   TUBAL LIGATION     Patient Active Problem List   Diagnosis Date Noted   Osteoarthritis of left hip 09/22/2022   Side pain 08/16/2022   Musculoskeletal pain 08/16/2022   Varicose veins of both legs with edema 01/05/2021   Swelling of limb 12/30/2020   Diabetes (HCC) 12/30/2020   Lymphedema 12/30/2020   S/P subtotal parathyroidectomy 11/08/2018   Hyperparathyroidism 06/20/2018   Thyroid  nodule 06/20/2018   Intractable pain 04/12/2015    PCP:  Ophelia Sage, MD  REFERRING PROVIDER: Norleen Gavel, MD  REFERRING DIAG:  [phone] (ICD-10-CM) - Left shoulder pain  M25.562 (ICD-10-CM) - Left knee pain    THERAPY DIAG:  Acute pain of left shoulder  Decreased ROM of left shoulder  Muscle  weakness (generalized)  Rationale for Evaluation and Treatment: Rehabilitation  ONSET DATE: 08/30/23  SUBJECTIVE:                                                                                                                                                                                      SUBJECTIVE STATEMENT:  Patient to see physician tomorrow. Patient reports she is not where she wants to be. Doesn't feel like is ready for discharge. Feels very weak, cannot sleep, has to sit up to sleep for the past two days. Patient reports she wants to pick up her grandson who is 28 lb.    Hand dominance: Right  PERTINENT HISTORY: Pt was  seen at [facility] [address] ED on 08/30/23 after a fall sustained at work. Pt works in the emergency room and reported that she slipped on a wet spot and fell onto her left shoulder and knee to avoid falling onto her L hip. Per ED note: Physical exam is generally reassuring. Shoulder joint itself has normal mobility and I am not concerned about dislocation. No evidence of clavicular injury. Mild proximal humerus fracture is possible and the patient is concerned about the fact that she has had multiple prior orthopedic injuries/fractures due to relatively minor falls. We will proceed with x-rays and anticipate restricted movement with shoulder sling, RICE, etc. Pt underwent L THA last June and has previous history of elbow surgery that prevents full movement of L wrist. Pt reports that she wore the sling provided in ED for a couple of weeks after the fall but has not worn it since.  PMH includes anemia, arthritis (back, L elbow, L hip, L ankle), asthma, DM, GERD, headache, hyperparathyroidism, lymphedema, seizure, sleep apnea  PAIN: Reported on L shoulder: Are you having pain? Yes: NPRS scale: 4/10 currently, worst 10/10 with movement, best: 4/10  Pain location: anterior shoulder Pain description: Sharp when it hits. Aggravating factors: reaching overhead/behind head, movement Relieving factors: Voltaren, Biofreeze, lidocaine  patches, rest  PRECAUTIONS: None  RED FLAGS: None   WEIGHT BEARING RESTRICTIONS: No  FALLS:  Has patient fallen in last 6 months? Yes. Number of falls 1- pt reports that this incident is her only fall  LIVING ENVIRONMENT: Lives with: lives with their spouse Lives in: Mobile home Stairs: Yes: External: 4 steps; can reach both Has following equipment at home: Single point cane and Walker - 2 wheeled  OCCUPATION: Works in ED at [employer]- pt reports that the pain hasn't affected her job duties that much because she is R handed  PLOF: Independent  PATIENT GOALS:  Pt wants to be able to  hold her 24 month old grandson, get range of motion  back to use her L arm normally.  NEXT MD VISIT: 10/12/23  OBJECTIVE:  Note: Objective measures were completed at Evaluation unless otherwise noted.  DIAGNOSTIC FINDINGS:  DG Humerus L (08/30/23): FINDINGS: There is no evidence of an acute fracture or dislocation. Radiopaque surgical screws and fixation wires are seen within the left olecranon process and left radial head. Soft tissues are unremarkable.   IMPRESSION: 1. No acute osseous abnormality. 2. Postoperative changes of the left elbow.   PATIENT SURVEYS:   QuickDASH Score: 68.2 / 100 = 68.2 %  COGNITION: Overall cognitive status: Within functional limits for tasks assessed     SENSATION: WFL- bilateral UE and LE  POSTURE: Pt holds L UE in guarded position at rest. L shoulder in sling upon arrival, but doffs with ease.   UPPER EXTREMITY ROM:   Active ROM Right eval Left PROM eval  Shoulder flexion 155 90  Shoulder extension    Shoulder abduction 120 90  Shoulder adduction    Shoulder internal rotation 75 NT  Shoulder external rotation 50 NT  Elbow flexion    Elbow extension    Wrist flexion    Wrist extension    Wrist ulnar deviation    Wrist radial deviation    Wrist pronation    Wrist supination    (Blank rows = not tested)   UPPER EXTREMITY MMT:  MMT Right eval Left eval  Shoulder flexion 4 NT  Shoulder extension    Shoulder abduction 3+ NT  Shoulder adduction    Shoulder internal rotation    Shoulder external rotation    Middle trapezius    Lower trapezius    Elbow flexion 4 NT  Elbow extension 4 NT  Wrist flexion    Wrist extension    Wrist ulnar deviation    Wrist radial deviation    Wrist pronation    Wrist supination    Grip strength (lbs)    (Blank rows = not tested)    PALPATION:  Pt with expected tenderness around the incision sites and still covered with the bandaging.     TREATMENT DATE:  03/11/2024   TherEx: Seated: Knees to hip IR 15x; increased motion with repetition Scapular retraction with shoulder abduction seated 15x  Seated: grip strengthening with putty: squeezes 15x, pincher 15x  Standing cervical side bend 15x     TherAct: Standing green swiss ball: -on table 15x flexion -on table 15x abduction -press down for scapular stabilizations 15x 5 seconds' flexion, 15x 5 seconds abduction   Standing forward green physioball rolls into the forward flexion, 2x10  Standing with back against wall, x15 reps Standing with back against wall ER IR with slight abduction 15x      PATIENT EDUCATION: Education details: Exercise technique Person educated: Patient Education method: Explanation Education comprehension: verbalized understanding  HOME EXERCISE PROGRAM: Access Code: [email or web address] URL: [email or web address] Date: 12/11/2023 Prepared by: Sidra Simpers  Exercises - Circular Shoulder Pendulum with Table Support  - 1 x daily - 7 x weekly - 3 sets - 10 reps - Flexion-Extension Shoulder Pendulum with Table Support  - 1 x daily - 7 x weekly - 3 sets - 10 reps - Horizontal Shoulder Pendulum with Table Support  - 1 x daily - 7 x weekly - 3 sets - 10 reps - Supine Scapular Retraction  - 1 x daily - 7 x weekly - 3 sets - 10 reps - Seated Shoulder Shrug Circles AROM Backward  - 1 x daily - 7 x weekly -  3 sets - 10 reps - Wrist AROM Flexion Extension  - 1 x daily - 7 x weekly - 3 sets - 10 reps - Elbow Flexion PROM  - 1 x daily - 7 x weekly - 3 sets - 10 reps - Elbow Extension PROM  - 1 x daily - 7 x weekly - 3 sets - 10 reps - Forearm Pronation PROM  - 1 x daily - 7 x weekly - 3 sets - 10 reps - Forearm Supination PROM  - 1 x daily - 7 x weekly - 3 sets - 10 reps   Access Code: [email or web address]  URL: [email or web address]  Date: 10/05/2023  Prepared by: Ellana Kawa  Exercises  - Standing shoulder flexion wall slides  - 1 x daily - 7 x  weekly - 2 sets - 10 reps - 5 hold  - Standing Shoulder Abduction Slides at Wall  - 1 x daily - 7 x weekly - 2 sets - 10 reps - 5 hold  - Standing Isometric Shoulder Flexion with Doorway - Arm Bent  - 1 x daily - 7 x weekly - 2 sets - 10 reps - 5 hold  - Standing Isometric Shoulder Abduction with Doorway - Arm Bent  - 1 x daily - 7 x weekly - 2 sets - 10 reps - 5 hold  - Standing Isometric Shoulder Internal Rotation at Doorway  - 1 x daily - 7 x weekly - 2 sets - 10 reps - 5 hold  - Standing Isometric Shoulder Extension with Doorway - Arm Bent  - 1 x daily - 7 x weekly - 2 sets - 10 reps - 5 hold  - Seated Shoulder Pendulum Exercise  - 1 x daily - 7 x weekly - 2 sets - 10 reps - 5 hold   ASSESSMENT:  CLINICAL IMPRESSION:    Patient's goals performed session prior, please refer to note on 03/05/24 for further details. Patient is eager to have pain free increased strength ROM. She has noticeable hesitation for movement that improves when distracted with dual task.  Improved ROM and quality of movement noted with repetition of tasks. Patient is eager to participate in pain reduction techniques, is hesitant with ROM interventions.  Posture education and performance tolerated well. Addition of grip strengthening added to interventions and HEP this session. Patient reports 0/10 pain by end of session.  Pt will continue to benefit from skilled therapy to address remaining deficits in order to improve overall QoL and return to PLOF.         OBJECTIVE IMPAIRMENTS: Abnormal gait, decreased activity tolerance, decreased endurance, decreased mobility, difficulty walking, decreased ROM, decreased strength, hypomobility, increased edema, impaired perceived functional ability, impaired flexibility, impaired UE functional use, improper body mechanics, postural dysfunction, and pain.   ACTIVITY LIMITATIONS: carrying, lifting, bending, sitting, standing, squatting, stairs, transfers, bed mobility, bathing,  dressing, self feeding, reach over head, hygiene/grooming, locomotion level, and caring for others  PARTICIPATION LIMITATIONS: meal prep, cleaning, laundry, driving, shopping, community activity, occupation, and yard work  PERSONAL FACTORS: Age, Fitness, Profession, and 3+ comorbidities: anemia, arthritis (back, L elbow, L hip, L ankle), asthma, DM, GERD, headache, hyperparathyroidism, lymphedema, seizure, sleep apnea are also affecting patient's functional outcome.   REHAB POTENTIAL: Good  CLINICAL DECISION MAKING: Evolving/moderate complexity  EVALUATION COMPLEXITY: Moderate   GOALS: Goals reviewed with patient? Yes  SHORT TERM GOALS: Target date: 10/24/2023  Patient will be independent in home exercise program to improve strength/mobility for better functional independence with ADLs.  Baseline:See HEP above; 11/17/2023=Patient able to verbalize and today demonstrate good understanding of HEP especially now since she has received the results from MRI.  Goal status: MET   LONG TERM GOALS: Target date:05/06/2024    Patient will decrease Quick DASH score by > 8 points demonstrating reduced self-reported upper extremity disability. Baseline: 68.2/100 03/05/24: 77.3/100 Goal status: PROGRESSING  2.  Patient will improve L shoulder AROM to > 140 degrees of flexion, scaption, and abduction for improved ability to perform overhead activities. Baseline: avoided AROM due to surgical procedure 01/10/24: avoided AROM due to surgical procedure 03/05/24: 103 deg flexion AROM, 98 deg abduction Goal status: PROGRESSING  3.  Patient will report a worst pain of 6/10 on VAS in L shoulder to improve tolerance with ADLs and reduced symptoms with activities. Baseline: 10/10 at worst 01/10/24: 3/10 at worst 03/05/24: 10/10 at last visit Goal status: NOT MET  4.  Patient will be able to tolerate holding her young grandson without an increase in L shoulder pain to show improved L UE function and  increase pt's ability to participate in family care. Baseline: Pt is unable to pick up grandson due to precautions of the L shoulder following RTC repair 01/10/24: Unable to do so at this time due to lifting restrictions and AROM restrictions 03/05/24: Pt was able to hold her grandson, but only able to hold him on her R side.   Goal status: ONGOING   PLAN:  PT FREQUENCY: 2x/week  PT DURATION: 8 weeks  PLANNED INTERVENTIONS: 97164- PT Re-evaluation, 97750- Physical Performance Testing, 97110-Therapeutic exercises, 97530- Therapeutic activity, [id number]- Neuromuscular re-education, 97535- Self Care, [id number]- Manual therapy, [id number]- Gait training, [phone]- Orthotic Initial, [id number]- Orthotic/Prosthetic subsequent, [phone]- Canalith repositioning, [phone]- Splinting, [phone]- Electrical stimulation (manual), [id number]- Ultrasound, [id number]- Traction (mechanical), [id number]- Parrafin, 20560 (1-2 muscles), 20561 (3+ muscles)- Dry Needling, Patient/Family education, Balance training, Stair training, Taping, Joint mobilization, Spinal mobilization, Scar mobilization, Vestibular training, DME instructions, Cryotherapy, and Moist heat   PLAN FOR NEXT SESSION:   Continue with strengthening within pain tolerable range   Melondy Blanchard  Leopoldo, PT, DPT Physical Therapist - [facility]  Outpatient Physical Therapy- Main Campus [phone]    03/11/2024, 8:43 AM

## 2024-03-12 ENCOUNTER — Ambulatory Visit

## 2024-03-12 ENCOUNTER — Other Ambulatory Visit: Payer: Self-pay

## 2024-03-12 MED ORDER — TRAMADOL HCL 50 MG PO TABS
50.0000 mg | ORAL_TABLET | Freq: Three times a day (TID) | ORAL | 0 refills | Status: AC | PRN
  Filled 2024-03-12: qty 30, 10d supply, fill #0

## 2024-03-12 NOTE — Therapy (Signed)
 OUTPATIENT PHYSICAL THERAPY L SHOULDER TREATMENT/PHYSICAL THERAPY    Patient Name: Ashley [name] MRN: [id number] DOB:[birth year], 58 y.o., female Today's Date: 03/12/2024  END OF SESSION:      Past Medical History:  Diagnosis Date   Allergy    Anemia    Arthritis    back, left elbow, left hip, left ankle    Asthma    Blood transfusion without reported diagnosis    Complication of anesthesia    delayed emergence   Diabetes mellitus without complication (HCC)    diet controlled   Diverticulosis    GERD (gastroesophageal reflux disease)    Headache    History of hiatal hernia    History of kidney stones    Hyperparathyroidism    Lymphedema    PONV (postoperative nausea and vomiting)    S/P subtotal parathyroidectomy    Seizure (HCC)    as a child   Sleep apnea    cannot tolerate mask   Thyroid  nodule 06/20/2018   Varicose vein of leg    Past Surgical History:  Procedure Laterality Date   ACETABULUM FRACTURE SURGERY Left    CESAREAN SECTION     CHOLECYSTECTOMY     COLONOSCOPY WITH PROPOFOL  N/A 05/31/2021   Procedure: COLONOSCOPY WITH PROPOFOL ;  Surgeon: Maryruth Ole DASEN, MD;  Location: [facility];  Service: Endoscopy;  Laterality: N/A;   ELBOW FRACTURE SURGERY  1995   ESOPHAGOGASTRODUODENOSCOPY (EGD) WITH PROPOFOL  N/A 05/31/2021   Procedure: ESOPHAGOGASTRODUODENOSCOPY (EGD) WITH PROPOFOL ;  Surgeon: Maryruth Ole DASEN, MD;  Location: [facility] END[facility];  Service: Endoscopy;  Laterality: N/A;   FRACTURE SURGERY     left ankle complete repair, left elbow   HERNIA REPAIR     incisional   LAPAROSCOPIC GASTRIC SLEEVE RESECTION  2014   PARATHYROIDECTOMY N/A 10/25/2018   Procedure: PARATHYROIDECTOMY, DIABETIC, SLEEP APNEA;  Surgeon: Marolyn Nest, MD;  Location: [facility] ORS;  Service: General;  Laterality: N/A;   TONSILLECTOMY AND ADENOIDECTOMY N/A 12/08/2015   Procedure: TONSILLECTOMY AND ADENOIDECTOMY;  Surgeon: Chinita Hasten, MD;  Location: [facility]  ORS;  Service: ENT;  Laterality: N/A;   TOTAL HIP ARTHROPLASTY Left 09/22/2022   Procedure: Left posterior total hip arthroplasty;  Surgeon: Lorelle Hussar, MD;  Location: [facility] ORS;  Service: Orthopedics;  Laterality: Left;   TUBAL LIGATION     Patient Active Problem List   Diagnosis Date Noted   Osteoarthritis of left hip 09/22/2022   Side pain 08/16/2022   Musculoskeletal pain 08/16/2022   Varicose veins of both legs with edema 01/05/2021   Swelling of limb 12/30/2020   Diabetes (HCC) 12/30/2020   Lymphedema 12/30/2020   S/P subtotal parathyroidectomy 11/08/2018   Hyperparathyroidism 06/20/2018   Thyroid  nodule 06/20/2018   Intractable pain 04/12/2015    PCP:  Ophelia Sage, MD  REFERRING PROVIDER: Norleen Gavel, MD  REFERRING DIAG:  [phone] (ICD-10-CM) - Left shoulder pain  M25.562 (ICD-10-CM) - Left knee pain    THERAPY DIAG:  No diagnosis found.  Rationale for Evaluation and Treatment: Rehabilitation  ONSET DATE: 08/30/23  SUBJECTIVE:  SUBJECTIVE STATEMENT:  ***  Hand dominance: Right  PERTINENT HISTORY: Pt was seen at [facility] ED on 08/30/23 after a fall sustained at work. Pt works in the emergency room and reported that she slipped on a wet spot and fell onto her left shoulder and knee to avoid falling onto her L hip. Per ED note: Physical exam is generally reassuring. Shoulder joint itself has normal mobility and I am not concerned about dislocation. No evidence of clavicular injury. Mild proximal humerus fracture is possible and the patient is concerned about the fact that she has had multiple prior orthopedic injuries/fractures due to relatively minor falls. We will proceed with x-rays and anticipate restricted movement with shoulder sling, RICE, etc. Pt underwent L THA last June and has  previous history of elbow surgery that prevents full movement of L wrist. Pt reports that she wore the sling provided in ED for a couple of weeks after the fall but has not worn it since.  PMH includes anemia, arthritis (back, L elbow, L hip, L ankle), asthma, DM, GERD, headache, hyperparathyroidism, lymphedema, seizure, sleep apnea  PAIN: Reported on L shoulder: Are you having pain? Yes: NPRS scale: 4/10 currently, worst 10/10 with movement, best: 4/10  Pain location: anterior shoulder Pain description: Sharp when it hits. Aggravating factors: reaching overhead/behind head, movement Relieving factors: Voltaren, Biofreeze, lidocaine  patches, rest  PRECAUTIONS: None  RED FLAGS: None   WEIGHT BEARING RESTRICTIONS: No  FALLS:  Has patient fallen in last 6 months? Yes. Number of falls 1- pt reports that this incident is her only fall  LIVING ENVIRONMENT: Lives with: lives with their spouse Lives in: Mobile home Stairs: Yes: External: 4 steps; can reach both Has following equipment at home: Single point cane and Walker - 2 wheeled  OCCUPATION: Works in ED at [employer]- pt reports that the pain hasn't affected her job duties that much because she is R handed  PLOF: Independent  PATIENT GOALS:  Pt wants to be able to hold her 63 month old grandson, get range of motion back to use her L arm normally.  NEXT MD VISIT: 10/12/23  OBJECTIVE:  Note: Objective measures were completed at Evaluation unless otherwise noted.  DIAGNOSTIC FINDINGS:  DG Humerus L (08/30/23): FINDINGS: There is no evidence of an acute fracture or dislocation. Radiopaque surgical screws and fixation wires are seen within the left olecranon process and left radial head. Soft tissues are unremarkable.   IMPRESSION: 1. No acute osseous abnormality. 2. Postoperative changes of the left elbow.   PATIENT SURVEYS:   QuickDASH Score: 68.2 / 100 = 68.2 %  COGNITION: Overall cognitive status: Within functional limits  for tasks assessed     SENSATION: WFL- bilateral UE and LE  POSTURE: Pt holds L UE in guarded position at rest. L shoulder in sling upon arrival, but doffs with ease.   UPPER EXTREMITY ROM:   Active ROM Right eval Left PROM eval  Shoulder flexion 155 90  Shoulder extension    Shoulder abduction 120 90  Shoulder adduction    Shoulder internal rotation 75 NT  Shoulder external rotation 50 NT  Elbow flexion    Elbow extension    Wrist flexion    Wrist extension    Wrist ulnar deviation    Wrist radial deviation    Wrist pronation    Wrist supination    (Blank rows = not tested)   UPPER EXTREMITY MMT:  MMT Right eval Left eval  Shoulder flexion 4 NT  Shoulder  extension    Shoulder abduction 3+ NT  Shoulder adduction    Shoulder internal rotation    Shoulder external rotation    Middle trapezius    Lower trapezius    Elbow flexion 4 NT  Elbow extension 4 NT  Wrist flexion    Wrist extension    Wrist ulnar deviation    Wrist radial deviation    Wrist pronation    Wrist supination    Grip strength (lbs)    (Blank rows = not tested)    PALPATION:  Pt with expected tenderness around the incision sites and still covered with the bandaging.     TREATMENT DATE: 03/12/2024   TherEx: Seated: Knees to hip IR 15x; increased motion with repetition Scapular retraction with shoulder abduction seated 15x  Seated: grip strengthening with putty: squeezes 15x, pincher 15x  Standing cervical side bend 15x     TherAct: Standing green swiss ball: -on table 15x flexion -on table 15x abduction -press down for scapular stabilizations 15x 5 seconds' flexion, 15x 5 seconds abduction   Standing forward green physioball rolls into the forward flexion, 2x10  Standing with back against wall, x15 reps Standing with back against wall ER IR with slight abduction 15x      PATIENT EDUCATION: Education details: Exercise technique Person educated: Patient Education  method: Explanation Education comprehension: verbalized understanding  HOME EXERCISE PROGRAM: Access Code: [email or web address] URL: [email or web address] Date: 12/11/2023 Prepared by: Sidra Simpers  Exercises - Circular Shoulder Pendulum with Table Support  - 1 x daily - 7 x weekly - 3 sets - 10 reps - Flexion-Extension Shoulder Pendulum with Table Support  - 1 x daily - 7 x weekly - 3 sets - 10 reps - Horizontal Shoulder Pendulum with Table Support  - 1 x daily - 7 x weekly - 3 sets - 10 reps - Supine Scapular Retraction  - 1 x daily - 7 x weekly - 3 sets - 10 reps - Seated Shoulder Shrug Circles AROM Backward  - 1 x daily - 7 x weekly - 3 sets - 10 reps - Wrist AROM Flexion Extension  - 1 x daily - 7 x weekly - 3 sets - 10 reps - Elbow Flexion PROM  - 1 x daily - 7 x weekly - 3 sets - 10 reps - Elbow Extension PROM  - 1 x daily - 7 x weekly - 3 sets - 10 reps - Forearm Pronation PROM  - 1 x daily - 7 x weekly - 3 sets - 10 reps - Forearm Supination PROM  - 1 x daily - 7 x weekly - 3 sets - 10 reps   Access Code: [email or web address]  URL: [email or web address]  Date: 10/05/2023  Prepared by: Aqil Goetting  Exercises  - Standing shoulder flexion wall slides  - 1 x daily - 7 x weekly - 2 sets - 10 reps - 5 hold  - Standing Shoulder Abduction Slides at Wall  - 1 x daily - 7 x weekly - 2 sets - 10 reps - 5 hold  - Standing Isometric Shoulder Flexion with Doorway - Arm Bent  - 1 x daily - 7 x weekly - 2 sets - 10 reps - 5 hold  - Standing Isometric Shoulder Abduction with Doorway - Arm Bent  - 1 x daily - 7 x weekly - 2 sets - 10 reps - 5 hold  - Standing Isometric Shoulder Internal Rotation at Doorway  - 1 x daily - 7 x weekly -  2 sets - 10 reps - 5 hold  - Standing Isometric Shoulder Extension with Doorway - Arm Bent  - 1 x daily - 7 x weekly - 2 sets - 10 reps - 5 hold  - Seated Shoulder Pendulum Exercise  - 1 x daily - 7 x weekly - 2 sets - 10 reps - 5 hold    ASSESSMENT:  CLINICAL IMPRESSION:   ***  Pt will continue to benefit from skilled therapy to address remaining deficits in order to improve overall QoL and return to PLOF.         OBJECTIVE IMPAIRMENTS: Abnormal gait, decreased activity tolerance, decreased endurance, decreased mobility, difficulty walking, decreased ROM, decreased strength, hypomobility, increased edema, impaired perceived functional ability, impaired flexibility, impaired UE functional use, improper body mechanics, postural dysfunction, and pain.   ACTIVITY LIMITATIONS: carrying, lifting, bending, sitting, standing, squatting, stairs, transfers, bed mobility, bathing, dressing, self feeding, reach over head, hygiene/grooming, locomotion level, and caring for others  PARTICIPATION LIMITATIONS: meal prep, cleaning, laundry, driving, shopping, community activity, occupation, and yard work  PERSONAL FACTORS: Age, Fitness, Profession, and 3+ comorbidities: anemia, arthritis (back, L elbow, L hip, L ankle), asthma, DM, GERD, headache, hyperparathyroidism, lymphedema, seizure, sleep apnea are also affecting patient's functional outcome.   REHAB POTENTIAL: Good  CLINICAL DECISION MAKING: Evolving/moderate complexity  EVALUATION COMPLEXITY: Moderate   GOALS: Goals reviewed with patient? Yes  SHORT TERM GOALS: Target date: 10/24/2023  Patient will be independent in home exercise program to improve strength/mobility for better functional independence with ADLs. Baseline:See HEP above; 11/17/2023=Patient able to verbalize and today demonstrate good understanding of HEP especially now since she has received the results from MRI.  Goal status: MET   LONG TERM GOALS: Target date:05/06/2024    Patient will decrease Quick DASH score by > 8 points demonstrating reduced self-reported upper extremity disability. Baseline: 68.2/100 03/05/24: 77.3/100 Goal status: PROGRESSING  2.  Patient will improve L shoulder AROM to > 140  degrees of flexion, scaption, and abduction for improved ability to perform overhead activities. Baseline: avoided AROM due to surgical procedure 01/10/24: avoided AROM due to surgical procedure 03/05/24: 103 deg flexion AROM, 98 deg abduction Goal status: PROGRESSING  3.  Patient will report a worst pain of 6/10 on VAS in L shoulder to improve tolerance with ADLs and reduced symptoms with activities. Baseline: 10/10 at worst 01/10/24: 3/10 at worst 03/05/24: 10/10 at last visit Goal status: NOT MET  4.  Patient will be able to tolerate holding her young grandson without an increase in L shoulder pain to show improved L UE function and increase pt's ability to participate in family care. Baseline: Pt is unable to pick up grandson due to precautions of the L shoulder following RTC repair 01/10/24: Unable to do so at this time due to lifting restrictions and AROM restrictions 03/05/24: Pt was able to hold her grandson, but only able to hold him on her R side.   Goal status: ONGOING   PLAN:  PT FREQUENCY: 2x/week  PT DURATION: 8 weeks  PLANNED INTERVENTIONS: 97164- PT Re-evaluation, 97750- Physical Performance Testing, 97110-Therapeutic exercises, 97530- Therapeutic activity, [id number]- Neuromuscular re-education, 97535- Self Care, 02859- Manual therapy, [id number]- Gait training, [id number]- Orthotic Initial, [id number]- Orthotic/Prosthetic subsequent, [phone]- Canalith repositioning, [id number]- Splinting, [id number]- Electrical stimulation (manual), [id number]- Ultrasound, [id number]- Traction (mechanical), [id number]- Parrafin, [id number] (1-2 muscles), [id number] (3+ muscles)- Dry Needling, Patient/Family education, Balance training, Stair training, Taping, Joint mobilization, Spinal mobilization, Scar mobilization, Vestibular training, DME instructions, Cryotherapy, and Moist heat  PLAN FOR NEXT SESSION:   Continue with strengthening within pain tolerable range   Efosa Treichler  Leopoldo, PT, DPT Physical Therapist - [facility]  Outpatient Physical Therapy- Main [facility] [phone]    03/12/2024, 9:27 AM

## 2024-03-13 ENCOUNTER — Other Ambulatory Visit: Payer: Self-pay

## 2024-03-13 ENCOUNTER — Ambulatory Visit: Payer: Worker's Compensation

## 2024-03-13 DIAGNOSIS — M6281 Muscle weakness (generalized): Secondary | ICD-10-CM

## 2024-03-13 DIAGNOSIS — M25512 Pain in left shoulder: Secondary | ICD-10-CM

## 2024-03-13 DIAGNOSIS — M25612 Stiffness of left shoulder, not elsewhere classified: Secondary | ICD-10-CM

## 2024-03-13 MED ORDER — PREGABALIN 25 MG PO CAPS
25.0000 mg | ORAL_CAPSULE | Freq: Two times a day (BID) | ORAL | 5 refills | Status: AC
  Filled 2024-03-13: qty 60, 30d supply, fill #0

## 2024-03-13 MED ORDER — PREGABALIN 25 MG PO CAPS: 25.0000 mg | ORAL_CAPSULE | Freq: Two times a day (BID) | ORAL | 5 refills | Status: AC

## 2024-03-13 MED ORDER — ESCITALOPRAM OXALATE 10 MG PO TABS
10.0000 mg | ORAL_TABLET | Freq: Every day | ORAL | 3 refills | Status: AC
  Filled 2024-03-13: qty 90, 90d supply, fill #0

## 2024-03-14 ENCOUNTER — Ambulatory Visit

## 2024-03-14 ENCOUNTER — Other Ambulatory Visit: Payer: Self-pay

## 2024-03-14 NOTE — Therapy (Incomplete)
 OUTPATIENT PHYSICAL THERAPY L SHOULDER TREATMENT    Patient Name: Ashley [name] MRN: [id number] DOB:1966-03-19, 58 y.o., female Today's Date: 03/14/2024  END OF SESSION:       Past Medical History:  Diagnosis Date   Allergy    Anemia    Arthritis    back, left elbow, left hip, left ankle    Asthma    Blood transfusion without reported diagnosis    Complication of anesthesia    delayed emergence   Diabetes mellitus without complication (HCC)    diet controlled   Diverticulosis    GERD (gastroesophageal reflux disease)    Headache    History of hiatal hernia    History of kidney stones    Hyperparathyroidism    Lymphedema    PONV (postoperative nausea and vomiting)    S/P subtotal parathyroidectomy    Seizure (HCC)    as a child   Sleep apnea    cannot tolerate mask   Thyroid  nodule 06/20/2018   Varicose vein of leg    Past Surgical History:  Procedure Laterality Date   ACETABULUM FRACTURE SURGERY Left    CESAREAN SECTION     CHOLECYSTECTOMY     COLONOSCOPY WITH PROPOFOL  N/A 05/31/2021   Procedure: COLONOSCOPY WITH PROPOFOL ;  Surgeon: Maryruth Ole DASEN, MD;  Location: [facility];  Service: Endoscopy;  Laterality: N/A;   ELBOW FRACTURE SURGERY  1995   ESOPHAGOGASTRODUODENOSCOPY (EGD) WITH PROPOFOL  N/A 05/31/2021   Procedure: ESOPHAGOGASTRODUODENOSCOPY (EGD) WITH PROPOFOL ;  Surgeon: Maryruth Ole DASEN, MD;  Location: [facility];  Service: Endoscopy;  Laterality: N/A;   FRACTURE SURGERY     left ankle complete repair, left elbow   HERNIA REPAIR     incisional   LAPAROSCOPIC GASTRIC SLEEVE RESECTION  2014   PARATHYROIDECTOMY N/A 10/25/2018   Procedure: PARATHYROIDECTOMY, DIABETIC, SLEEP APNEA;  Surgeon: Marolyn Nest, MD;  Location: [facility];  Service: General;  Laterality: N/A;   TONSILLECTOMY AND ADENOIDECTOMY N/A 12/08/2015   Procedure: TONSILLECTOMY AND ADENOIDECTOMY;  Surgeon: Chinita Hasten, MD;  Location: [facility];  Service:  ENT;  Laterality: N/A;   TOTAL HIP ARTHROPLASTY Left 09/22/2022   Procedure: Left posterior total hip arthroplasty;  Surgeon: Lorelle Hussar, MD;  Location: [facility];  Service: Orthopedics;  Laterality: Left;   TUBAL LIGATION     Patient Active Problem List   Diagnosis Date Noted   Osteoarthritis of left hip 09/22/2022   Side pain 08/16/2022   Musculoskeletal pain 08/16/2022   Varicose veins of both legs with edema 01/05/2021   Swelling of limb 12/30/2020   Diabetes (HCC) 12/30/2020   Lymphedema 12/30/2020   S/P subtotal parathyroidectomy 11/08/2018   Hyperparathyroidism 06/20/2018   Thyroid  nodule 06/20/2018   Intractable pain 04/12/2015    PCP:  Ophelia Sage, MD  REFERRING PROVIDER: Norleen Gavel, MD  REFERRING DIAG:  [phone] (ICD-10-CM) - Left shoulder pain  M25.562 (ICD-10-CM) - Left knee pain    THERAPY DIAG:  No diagnosis found.  Rationale for Evaluation and Treatment: Rehabilitation  ONSET DATE: 08/30/23  SUBJECTIVE:  SUBJECTIVE STATEMENT:  ***  Hand dominance: Right  PERTINENT HISTORY: Pt was seen at [facility] ED on 08/30/23 after a fall sustained at work. Pt works in the emergency room and reported that she slipped on a wet spot and fell onto her left shoulder and knee to avoid falling onto her L hip. Per ED note: Physical exam is generally reassuring. Shoulder joint itself has normal mobility and I am not concerned about dislocation. No evidence of clavicular injury. Mild proximal humerus fracture is possible and the patient is concerned about the fact that she has had multiple prior orthopedic injuries/fractures due to relatively minor falls. We will proceed with x-rays and anticipate restricted movement with shoulder sling, RICE, etc. Pt underwent L THA last June and has previous history of  elbow surgery that prevents full movement of L wrist. Pt reports that she wore the sling provided in ED for a couple of weeks after the fall but has not worn it since.  PMH includes anemia, arthritis (back, L elbow, L hip, L ankle), asthma, DM, GERD, headache, hyperparathyroidism, lymphedema, seizure, sleep apnea  PAIN: Reported on L shoulder: Are you having pain? Yes: NPRS scale: 4/10 currently, worst 10/10 with movement, best: 4/10  Pain location: anterior shoulder Pain description: Sharp when it hits. Aggravating factors: reaching overhead/behind head, movement Relieving factors: Voltaren, Biofreeze, lidocaine  patches, rest  PRECAUTIONS: None  RED FLAGS: None   WEIGHT BEARING RESTRICTIONS: No  FALLS:  Has patient fallen in last 6 months? Yes. Number of falls 1- pt reports that this incident is her only fall  LIVING ENVIRONMENT: Lives with: lives with their spouse Lives in: Mobile home Stairs: Yes: External: 4 steps; can reach both Has following equipment at home: Single point cane and Walker - 2 wheeled  OCCUPATION: Works in ED at [employer]- pt reports that the pain hasn't affected her job duties that much because she is R handed  PLOF: Independent  PATIENT GOALS:  Pt wants to be able to hold her 40 month old grandson, get range of motion back to use her L arm normally.  NEXT MD VISIT: 10/12/23  OBJECTIVE:  Note: Objective measures were completed at Evaluation unless otherwise noted.  DIAGNOSTIC FINDINGS:  DG Humerus L (08/30/23): FINDINGS: There is no evidence of an acute fracture or dislocation. Radiopaque surgical screws and fixation wires are seen within the left olecranon process and left radial head. Soft tissues are unremarkable.   IMPRESSION: 1. No acute osseous abnormality. 2. Postoperative changes of the left elbow.   PATIENT SURVEYS:   QuickDASH Score: 68.2 / 100 = 68.2 %  COGNITION: Overall cognitive status: Within functional limits for tasks  assessed     SENSATION: WFL- bilateral UE and LE  POSTURE: Pt holds L UE in guarded position at rest. L shoulder in sling upon arrival, but doffs with ease.   UPPER EXTREMITY ROM:   Active ROM Right eval Left PROM eval  Shoulder flexion 155 90  Shoulder extension    Shoulder abduction 120 90  Shoulder adduction    Shoulder internal rotation 75 NT  Shoulder external rotation 50 NT  Elbow flexion    Elbow extension    Wrist flexion    Wrist extension    Wrist ulnar deviation    Wrist radial deviation    Wrist pronation    Wrist supination    (Blank rows = not tested)   UPPER EXTREMITY MMT:  MMT Right eval Left eval  Shoulder flexion 4 NT  Shoulder  extension    Shoulder abduction 3+ NT  Shoulder adduction    Shoulder internal rotation    Shoulder external rotation    Middle trapezius    Lower trapezius    Elbow flexion 4 NT  Elbow extension 4 NT  Wrist flexion    Wrist extension    Wrist ulnar deviation    Wrist radial deviation    Wrist pronation    Wrist supination    Grip strength (lbs)    (Blank rows = not tested)    PALPATION:  Pt with expected tenderness around the incision sites and still covered with the bandaging.     TREATMENT DATE: 03/14/2024   TherEx: Seated: Knees to hip IR 15x; increased motion with repetition Open abduction with cervical extension 15x  YTB ER 15x; 2 sets Ball chest press for paloff motion 15x2 sets Ball overhead press with adduction squeeze 15x; 2 sets  Standing cervical side bend 15x   Wall circles 10x with ball     TherAct: Half foam roller flexion on wall 10x; 2 sets  Activity Description: reach to tap blaze pod on wall Activity Setting:  The Blaze Pod Random setting was chosen to enhance cognitive processing and agility, providing an unpredictable environment to simulate real-world scenarios, and fostering quick reactions and adaptability.   Number of Pods:  6 Cycles/Sets:  15 Duration (Time or Hit  Count):  3   Squat pick up ball and throw into hoop with affected arm x 20 balls     PATIENT EDUCATION: Education details: Exercise technique Person educated: Patient Education method: Explanation Education comprehension: verbalized understanding  HOME EXERCISE PROGRAM: Access Code: [email or web address] URL: [email or web address] Date: 12/11/2023 Prepared by: Sidra Simpers  Exercises - Circular Shoulder Pendulum with Table Support  - 1 x daily - 7 x weekly - 3 sets - 10 reps - Flexion-Extension Shoulder Pendulum with Table Support  - 1 x daily - 7 x weekly - 3 sets - 10 reps - Horizontal Shoulder Pendulum with Table Support  - 1 x daily - 7 x weekly - 3 sets - 10 reps - Supine Scapular Retraction  - 1 x daily - 7 x weekly - 3 sets - 10 reps - Seated Shoulder Shrug Circles AROM Backward  - 1 x daily - 7 x weekly - 3 sets - 10 reps - Wrist AROM Flexion Extension  - 1 x daily - 7 x weekly - 3 sets - 10 reps - Elbow Flexion PROM  - 1 x daily - 7 x weekly - 3 sets - 10 reps - Elbow Extension PROM  - 1 x daily - 7 x weekly - 3 sets - 10 reps - Forearm Pronation PROM  - 1 x daily - 7 x weekly - 3 sets - 10 reps - Forearm Supination PROM  - 1 x daily - 7 x weekly - 3 sets - 10 reps   Access Code: [email or web address]  URL: [email or web address]  Date: 10/05/2023  Prepared by: Kalesha Irving  Exercises  - Standing shoulder flexion wall slides  - 1 x daily - 7 x weekly - 2 sets - 10 reps - 5 hold  - Standing Shoulder Abduction Slides at Wall  - 1 x daily - 7 x weekly - 2 sets - 10 reps - 5 hold  - Standing Isometric Shoulder Flexion with Doorway - Arm Bent  - 1 x daily - 7 x weekly - 2 sets - 10 reps - 5 hold  - Standing Isometric  Shoulder Abduction with Doorway - Arm Bent  - 1 x daily - 7 x weekly - 2 sets - 10 reps - 5 hold  - Standing Isometric Shoulder Internal Rotation at Doorway  - 1 x daily - 7 x weekly - 2 sets - 10 reps - 5 hold  - Standing Isometric Shoulder Extension with  Doorway - Arm Bent  - 1 x daily - 7 x weekly - 2 sets - 10 reps - 5 hold  - Seated Shoulder Pendulum Exercise  - 1 x daily - 7 x weekly - 2 sets - 10 reps - 5 hold   ASSESSMENT:  CLINICAL IMPRESSION:   *** Pt will continue to benefit from skilled therapy to address remaining deficits in order to improve overall QoL and return to PLOF.         OBJECTIVE IMPAIRMENTS: Abnormal gait, decreased activity tolerance, decreased endurance, decreased mobility, difficulty walking, decreased ROM, decreased strength, hypomobility, increased edema, impaired perceived functional ability, impaired flexibility, impaired UE functional use, improper body mechanics, postural dysfunction, and pain.   ACTIVITY LIMITATIONS: carrying, lifting, bending, sitting, standing, squatting, stairs, transfers, bed mobility, bathing, dressing, self feeding, reach over head, hygiene/grooming, locomotion level, and caring for others  PARTICIPATION LIMITATIONS: meal prep, cleaning, laundry, driving, shopping, community activity, occupation, and yard work  PERSONAL FACTORS: Age, Fitness, Profession, and 3+ comorbidities: anemia, arthritis (back, L elbow, L hip, L ankle), asthma, DM, GERD, headache, hyperparathyroidism, lymphedema, seizure, sleep apnea are also affecting patient's functional outcome.   REHAB POTENTIAL: Good  CLINICAL DECISION MAKING: Evolving/moderate complexity  EVALUATION COMPLEXITY: Moderate   GOALS: Goals reviewed with patient? Yes  SHORT TERM GOALS: Target date: 10/24/2023  Patient will be independent in home exercise program to improve strength/mobility for better functional independence with ADLs. Baseline:See HEP above; 11/17/2023=Patient able to verbalize and today demonstrate good understanding of HEP especially now since she has received the results from MRI.  Goal status: MET   LONG TERM GOALS: Target date:05/06/2024    Patient will decrease Quick DASH score by > 8 points demonstrating  reduced self-reported upper extremity disability. Baseline: 68.2/100 03/05/24: 77.3/100 Goal status: PROGRESSING  2.  Patient will improve L shoulder AROM to > 140 degrees of flexion, scaption, and abduction for improved ability to perform overhead activities. Baseline: avoided AROM due to surgical procedure 01/10/24: avoided AROM due to surgical procedure 03/05/24: 103 deg flexion AROM, 98 deg abduction Goal status: PROGRESSING  3.  Patient will report a worst pain of 6/10 on VAS in L shoulder to improve tolerance with ADLs and reduced symptoms with activities. Baseline: 10/10 at worst 01/10/24: 3/10 at worst 03/05/24: 10/10 at last visit Goal status: NOT MET  4.  Patient will be able to tolerate holding her young grandson without an increase in L shoulder pain to show improved L UE function and increase pt's ability to participate in family care. Baseline: Pt is unable to pick up grandson due to precautions of the L shoulder following RTC repair 01/10/24: Unable to do so at this time due to lifting restrictions and AROM restrictions 03/05/24: Pt was able to hold her grandson, but only able to hold him on her R side.   Goal status: ONGOING   PLAN:  PT FREQUENCY: 2x/week  PT DURATION: 8 weeks  PLANNED INTERVENTIONS: 97164- PT Re-evaluation, 97750- Physical Performance Testing, 97110-Therapeutic exercises, 97530- Therapeutic activity, [id number]- Neuromuscular re-education, 97535- Self Care, [id number]- Manual therapy, [id number]- Gait training, [id number]- Orthotic Initial, [id number]- Orthotic/Prosthetic subsequent, [phone]- Canalith repositioning,  02239- Splinting, 02967- Electrical stimulation (manual), [id number]- Ultrasound, [id number]- Traction (mechanical), [id number]- Parrafin, 79439 (1-2 muscles), [id number] (3+ muscles)- Dry Needling, Patient/Family education, Balance training, Stair training, Taping, Joint mobilization, Spinal mobilization, Scar mobilization, Vestibular training, DME instructions, Cryotherapy, and Moist  heat   PLAN FOR NEXT SESSION:   Continue with strengthening within pain tolerable range   Swayzee Wadley  Leopoldo, PT, DPT Physical Therapist - [facility]  Outpatient Physical Therapy- Main [facility] [phone]    03/14/2024, 8:39 AM

## 2024-03-15 ENCOUNTER — Other Ambulatory Visit: Payer: Self-pay

## 2024-03-15 MED ORDER — TIZANIDINE HCL 2 MG PO TABS
2.0000 mg | ORAL_TABLET | Freq: Three times a day (TID) | ORAL | 0 refills | Status: AC | PRN
  Filled 2024-03-15: qty 30, 10d supply, fill #0

## 2024-03-18 ENCOUNTER — Ambulatory Visit: Payer: Worker's Compensation

## 2024-03-18 ENCOUNTER — Other Ambulatory Visit: Payer: Self-pay

## 2024-03-18 DIAGNOSIS — M25612 Stiffness of left shoulder, not elsewhere classified: Secondary | ICD-10-CM

## 2024-03-18 DIAGNOSIS — M25512 Pain in left shoulder: Secondary | ICD-10-CM

## 2024-03-18 DIAGNOSIS — M6281 Muscle weakness (generalized): Secondary | ICD-10-CM

## 2024-03-19 ENCOUNTER — Other Ambulatory Visit: Payer: Self-pay

## 2024-03-19 MED ORDER — BUSPIRONE HCL 7.5 MG PO TABS
7.5000 mg | ORAL_TABLET | Freq: Two times a day (BID) | ORAL | 3 refills | Status: AC | PRN
  Filled 2024-03-19: qty 180, 90d supply, fill #0

## 2024-03-19 NOTE — Therapy (Incomplete)
 " OUTPATIENT PHYSICAL THERAPY L SHOULDER TREATMENT    Patient Name: Ashley [name] MRN: [id number] DOB:1965-12-02, 58 y.o., female Today's Date: 03/19/2024  END OF SESSION:        Past Medical History:  Diagnosis Date   Allergy    Anemia    Arthritis    back, left elbow, left hip, left ankle    Asthma    Blood transfusion without reported diagnosis    Complication of anesthesia    delayed emergence   Diabetes mellitus without complication (HCC)    diet controlled   Diverticulosis    GERD (gastroesophageal reflux disease)    Headache    History of hiatal hernia    History of kidney stones    Hyperparathyroidism    Lymphedema    PONV (postoperative nausea and vomiting)    S/P subtotal parathyroidectomy    Seizure (HCC)    as a child   Sleep apnea    cannot tolerate mask   Thyroid  nodule 06/20/2018   Varicose vein of leg    Past Surgical History:  Procedure Laterality Date   ACETABULUM FRACTURE SURGERY Left    CESAREAN SECTION     CHOLECYSTECTOMY     COLONOSCOPY WITH PROPOFOL  N/A 05/31/2021   Procedure: COLONOSCOPY WITH PROPOFOL ;  Surgeon: Maryruth Ole DASEN, MD;  Location: [facility];  Service: Endoscopy;  Laterality: N/A;   ELBOW FRACTURE SURGERY  1995   ESOPHAGOGASTRODUODENOSCOPY (EGD) WITH PROPOFOL  N/A 05/31/2021   Procedure: ESOPHAGOGASTRODUODENOSCOPY (EGD) WITH PROPOFOL ;  Surgeon: Maryruth Ole DASEN, MD;  Location: [facility];  Service: Endoscopy;  Laterality: N/A;   FRACTURE SURGERY     left ankle complete repair, left elbow   HERNIA REPAIR     incisional   LAPAROSCOPIC GASTRIC SLEEVE RESECTION  2014   PARATHYROIDECTOMY N/A 10/25/2018   Procedure: PARATHYROIDECTOMY, DIABETIC, SLEEP APNEA;  Surgeon: Marolyn Nest, MD;  Location: [facility];  Service: General;  Laterality: N/A;   TONSILLECTOMY AND ADENOIDECTOMY N/A 12/08/2015   Procedure: TONSILLECTOMY AND ADENOIDECTOMY;  Surgeon: Chinita Hasten, MD;  Location: [facility];  Service:  ENT;  Laterality: N/A;   TOTAL HIP ARTHROPLASTY Left 09/22/2022   Procedure: Left posterior total hip arthroplasty;  Surgeon: Lorelle Hussar, MD;  Location: [facility];  Service: Orthopedics;  Laterality: Left;   TUBAL LIGATION     Patient Active Problem List   Diagnosis Date Noted   Osteoarthritis of left hip 09/22/2022   Side pain 08/16/2022   Musculoskeletal pain 08/16/2022   Varicose veins of both legs with edema 01/05/2021   Swelling of limb 12/30/2020   Diabetes (HCC) 12/30/2020   Lymphedema 12/30/2020   S/P subtotal parathyroidectomy 11/08/2018   Hyperparathyroidism 06/20/2018   Thyroid  nodule 06/20/2018   Intractable pain 04/12/2015    PCP:  Ophelia Sage, MD  REFERRING PROVIDER: Norleen Gavel, MD  REFERRING DIAG:  [id number] (ICD-10-CM) - Left shoulder pain  M25.562 (ICD-10-CM) - Left knee pain    THERAPY DIAG:  No diagnosis found.  Rationale for Evaluation and Treatment: Rehabilitation  ONSET DATE: 08/30/23  SUBJECTIVE:  SUBJECTIVE STATEMENT: ***  Hand dominance: Right  PERTINENT HISTORY: Pt was seen at [facility] ED on 08/30/23 after a fall sustained at work. Pt works in the emergency room and reported that she slipped on a wet spot and fell onto her left shoulder and knee to avoid falling onto her L hip. Per ED note: Physical exam is generally reassuring. Shoulder joint itself has normal mobility and I am not concerned about dislocation. No evidence of clavicular injury. Mild proximal humerus fracture is possible and the patient is concerned about the fact that she has had multiple prior orthopedic injuries/fractures due to relatively minor falls. We will proceed with x-rays and anticipate restricted movement with shoulder sling, RICE, etc. Pt underwent L THA last June and has previous history of  elbow surgery that prevents full movement of L wrist. Pt reports that she wore the sling provided in ED for a couple of weeks after the fall but has not worn it since.  PMH includes anemia, arthritis (back, L elbow, L hip, L ankle), asthma, DM, GERD, headache, hyperparathyroidism, lymphedema, seizure, sleep apnea  PAIN: Reported on L shoulder: Are you having pain? Yes: NPRS scale: 4/10 currently, worst 10/10 with movement, best: 4/10  Pain location: anterior shoulder Pain description: Sharp when it hits. Aggravating factors: reaching overhead/behind head, movement Relieving factors: Voltaren, Biofreeze, lidocaine  patches, rest  PRECAUTIONS: None  RED FLAGS: None   WEIGHT BEARING RESTRICTIONS: No  FALLS:  Has patient fallen in last 6 months? Yes. Number of falls 1- pt reports that this incident is her only fall  LIVING ENVIRONMENT: Lives with: lives with their spouse Lives in: Mobile home Stairs: Yes: External: 4 steps; can reach both Has following equipment at home: Single point cane and Walker - 2 wheeled  OCCUPATION: Works in ED at [employer]- pt reports that the pain hasn't affected her job duties that much because she is R handed  PLOF: Independent  PATIENT GOALS:  Pt wants to be able to hold her 65 month old grandson, get range of motion back to use her L arm normally.  NEXT MD VISIT: 10/12/23  OBJECTIVE:  Note: Objective measures were completed at Evaluation unless otherwise noted.  DIAGNOSTIC FINDINGS:  DG Humerus L (08/30/23): FINDINGS: There is no evidence of an acute fracture or dislocation. Radiopaque surgical screws and fixation wires are seen within the left olecranon process and left radial head. Soft tissues are unremarkable.   IMPRESSION: 1. No acute osseous abnormality. 2. Postoperative changes of the left elbow.   PATIENT SURVEYS:   QuickDASH Score: 68.2 / 100 = 68.2 %  COGNITION: Overall cognitive status: Within functional limits for tasks  assessed     SENSATION: WFL- bilateral UE and LE  POSTURE: Pt holds L UE in guarded position at rest. L shoulder in sling upon arrival, but doffs with ease.   UPPER EXTREMITY ROM:   Active ROM Right eval Left PROM eval  Shoulder flexion 155 90  Shoulder extension    Shoulder abduction 120 90  Shoulder adduction    Shoulder internal rotation 75 NT  Shoulder external rotation 50 NT  Elbow flexion    Elbow extension    Wrist flexion    Wrist extension    Wrist ulnar deviation    Wrist radial deviation    Wrist pronation    Wrist supination    (Blank rows = not tested)   UPPER EXTREMITY MMT:  MMT Right eval Left eval  Shoulder flexion 4 NT  Shoulder extension  Shoulder abduction 3+ NT  Shoulder adduction    Shoulder internal rotation    Shoulder external rotation    Middle trapezius    Lower trapezius    Elbow flexion 4 NT  Elbow extension 4 NT  Wrist flexion    Wrist extension    Wrist ulnar deviation    Wrist radial deviation    Wrist pronation    Wrist supination    Grip strength (lbs)    (Blank rows = not tested)    PALPATION:  Pt with expected tenderness around the incision sites and still covered with the bandaging.     TREATMENT DATE: 03/19/2024   TherEx: Seated: Knees to hip IR 15x; increased motion with repetition Open abduction with cervical extension 15x  Median nerve glide LUE 15x seated YTB ER 15x; 2 sets Seated flexion 10x   AAROM with UE ranger: -flexion:15x -abduction 15x -ABCs  Standing cervical side bend 15x     TherAct: Half foam roller flexion on wall 10x; 2 sets Wall posture 15x 2 sets Wall scapular pushups 15x  Swiss ball on table: -isometric press down 15x 3 second holds in flexion position; 15x3 in abduction position  -forward rollouts 15x    PATIENT EDUCATION: Education details: Exercise technique Person educated: Patient Education method: Explanation Education comprehension: verbalized  understanding  HOME EXERCISE PROGRAM: Access Code: [email or web address] URL: [email or web address] Date: 12/11/2023 Prepared by: Sidra Simpers  Exercises - Circular Shoulder Pendulum with Table Support  - 1 x daily - 7 x weekly - 3 sets - 10 reps - Flexion-Extension Shoulder Pendulum with Table Support  - 1 x daily - 7 x weekly - 3 sets - 10 reps - Horizontal Shoulder Pendulum with Table Support  - 1 x daily - 7 x weekly - 3 sets - 10 reps - Supine Scapular Retraction  - 1 x daily - 7 x weekly - 3 sets - 10 reps - Seated Shoulder Shrug Circles AROM Backward  - 1 x daily - 7 x weekly - 3 sets - 10 reps - Wrist AROM Flexion Extension  - 1 x daily - 7 x weekly - 3 sets - 10 reps - Elbow Flexion PROM  - 1 x daily - 7 x weekly - 3 sets - 10 reps - Elbow Extension PROM  - 1 x daily - 7 x weekly - 3 sets - 10 reps - Forearm Pronation PROM  - 1 x daily - 7 x weekly - 3 sets - 10 reps - Forearm Supination PROM  - 1 x daily - 7 x weekly - 3 sets - 10 reps   Access Code: [email or web address]  URL: [email or web address]  Date: 10/05/2023  Prepared by: Marciel Offenberger  Exercises  - Standing shoulder flexion wall slides  - 1 x daily - 7 x weekly - 2 sets - 10 reps - 5 hold  - Standing Shoulder Abduction Slides at Wall  - 1 x daily - 7 x weekly - 2 sets - 10 reps - 5 hold  - Standing Isometric Shoulder Flexion with Doorway - Arm Bent  - 1 x daily - 7 x weekly - 2 sets - 10 reps - 5 hold  - Standing Isometric Shoulder Abduction with Doorway - Arm Bent  - 1 x daily - 7 x weekly - 2 sets - 10 reps - 5 hold  - Standing Isometric Shoulder Internal Rotation at Doorway  - 1 x daily - 7 x weekly - 2 sets - 10 reps - 5  hold  - Standing Isometric Shoulder Extension with Doorway - Arm Bent  - 1 x daily - 7 x weekly - 2 sets - 10 reps - 5 hold  - Seated Shoulder Pendulum Exercise  - 1 x daily - 7 x weekly - 2 sets - 10 reps - 5 hold   ASSESSMENT:  CLINICAL IMPRESSION:   ***  Pt will continue to benefit  from skilled therapy to address remaining deficits in order to improve overall QoL and return to PLOF.         OBJECTIVE IMPAIRMENTS: Abnormal gait, decreased activity tolerance, decreased endurance, decreased mobility, difficulty walking, decreased ROM, decreased strength, hypomobility, increased edema, impaired perceived functional ability, impaired flexibility, impaired UE functional use, improper body mechanics, postural dysfunction, and pain.   ACTIVITY LIMITATIONS: carrying, lifting, bending, sitting, standing, squatting, stairs, transfers, bed mobility, bathing, dressing, self feeding, reach over head, hygiene/grooming, locomotion level, and caring for others  PARTICIPATION LIMITATIONS: meal prep, cleaning, laundry, driving, shopping, community activity, occupation, and yard work  PERSONAL FACTORS: Age, Fitness, Profession, and 3+ comorbidities: anemia, arthritis (back, L elbow, L hip, L ankle), asthma, DM, GERD, headache, hyperparathyroidism, lymphedema, seizure, sleep apnea are also affecting patient's functional outcome.   REHAB POTENTIAL: Good  CLINICAL DECISION MAKING: Evolving/moderate complexity  EVALUATION COMPLEXITY: Moderate   GOALS: Goals reviewed with patient? Yes  SHORT TERM GOALS: Target date: 10/24/2023  Patient will be independent in home exercise program to improve strength/mobility for better functional independence with ADLs. Baseline:See HEP above; 11/17/2023=Patient able to verbalize and today demonstrate good understanding of HEP especially now since she has received the results from MRI.  Goal status: MET   LONG TERM GOALS: Target date:05/06/2024    Patient will decrease Quick DASH score by > 8 points demonstrating reduced self-reported upper extremity disability. Baseline: 68.2/100 03/05/24: 77.3/100 Goal status: PROGRESSING  2.  Patient will improve L shoulder AROM to > 140 degrees of flexion, scaption, and abduction for improved ability to perform  overhead activities. Baseline: avoided AROM due to surgical procedure 01/10/24: avoided AROM due to surgical procedure 03/05/24: 103 deg flexion AROM, 98 deg abduction Goal status: PROGRESSING  3.  Patient will report a worst pain of 6/10 on VAS in L shoulder to improve tolerance with ADLs and reduced symptoms with activities. Baseline: 10/10 at worst 01/10/24: 3/10 at worst 03/05/24: 10/10 at last visit Goal status: NOT MET  4.  Patient will be able to tolerate holding her young grandson without an increase in L shoulder pain to show improved L UE function and increase pt's ability to participate in family care. Baseline: Pt is unable to pick up grandson due to precautions of the L shoulder following RTC repair 01/10/24: Unable to do so at this time due to lifting restrictions and AROM restrictions 03/05/24: Pt was able to hold her grandson, but only able to hold him on her R side.   Goal status: ONGOING   PLAN:  PT FREQUENCY: 2x/week  PT DURATION: 8 weeks  PLANNED INTERVENTIONS: 97164- PT Re-evaluation, 97750- Physical Performance Testing, 97110-Therapeutic exercises, 97530- Therapeutic activity, [id number]- Neuromuscular re-education, 97535- Self Care, [id number]- Manual therapy, [id number]- Gait training, [id number]- Orthotic Initial, [id number]- Orthotic/Prosthetic subsequent, [id number]- Canalith repositioning, [id number]- Splinting, [id number]- Electrical stimulation (manual), [id number]- Ultrasound, [id number]- Traction (mechanical), [id number]- Parrafin, [id number] (1-2 muscles), [id number] (3+ muscles)- Dry Needling, Patient/Family education, Balance training, Stair training, Taping, Joint mobilization, Spinal mobilization, Scar mobilization, Vestibular training, DME instructions, Cryotherapy, and Moist heat   PLAN FOR NEXT SESSION:  Continue with strengthening within pain tolerable range   Janeene Sand  Leopoldo, PT, DPT Physical Therapist - [facility]  Outpatient Physical Therapy- [facility] [phone]     03/19/2024, 9:37 AM  "

## 2024-03-25 ENCOUNTER — Ambulatory Visit

## 2024-03-27 ENCOUNTER — Ambulatory Visit

## 2024-03-27 ENCOUNTER — Telehealth: Payer: Self-pay

## 2024-03-27 NOTE — Telephone Encounter (Signed)
 Patient called due to no show. Patient apologized, forgot to call and cancel due to conflict.   Moxie Kalil  Leopoldo, PT, DPT Physical Therapist -  [facility]  Outpatient Physical Therapy- Main Campus [phone]

## 2024-04-01 ENCOUNTER — Ambulatory Visit: Payer: Worker's Compensation | Attending: Orthopedic Surgery

## 2024-04-01 DIAGNOSIS — M25512 Pain in left shoulder: Secondary | ICD-10-CM | POA: Diagnosis present

## 2024-04-01 DIAGNOSIS — M25612 Stiffness of left shoulder, not elsewhere classified: Secondary | ICD-10-CM | POA: Diagnosis present

## 2024-04-01 DIAGNOSIS — M6281 Muscle weakness (generalized): Secondary | ICD-10-CM | POA: Insufficient documentation

## 2024-04-01 DIAGNOSIS — M25562 Pain in left knee: Secondary | ICD-10-CM | POA: Diagnosis present

## 2024-04-01 NOTE — Therapy (Signed)
 " OUTPATIENT PHYSICAL THERAPY L SHOULDER TREATMENT    Patient Name: Ashley [name] MRN: [id number] DOB:1965-07-18, 59 y.o., female Today's Date: 04/01/2024  END OF SESSION:   PT End of Session - 04/01/24 0817     Visit Number 24    Number of Visits 37   corrected   Date for Recertification  05/06/24    PT Start Time 0815    PT Stop Time 0844    PT Time Calculation (min) 29 min    Activity Tolerance Patient tolerated treatment well;Patient limited by pain    Behavior During Therapy [facility] for tasks assessed/performed              Past Medical History:  Diagnosis Date   Allergy    Anemia    Arthritis    back, left elbow, left hip, left ankle    Asthma    Blood transfusion without reported diagnosis    Complication of anesthesia    delayed emergence   Diabetes mellitus without complication (HCC)    diet controlled   Diverticulosis    GERD (gastroesophageal reflux disease)    Headache    History of hiatal hernia    History of kidney stones    Hyperparathyroidism    Lymphedema    PONV (postoperative nausea and vomiting)    S/P subtotal parathyroidectomy    Seizure (HCC)    as a child   Sleep apnea    cannot tolerate mask   Thyroid  nodule 06/20/2018   Varicose vein of leg    Past Surgical History:  Procedure Laterality Date   ACETABULUM FRACTURE SURGERY Left    CESAREAN SECTION     CHOLECYSTECTOMY     COLONOSCOPY WITH PROPOFOL  N/A 05/31/2021   Procedure: COLONOSCOPY WITH PROPOFOL ;  Surgeon: Maryruth Ole DASEN, MD;  Location: [facility] ENDOSCOPY;  Service: Endoscopy;  Laterality: N/A;   ELBOW FRACTURE SURGERY  1995   ESOPHAGOGASTRODUODENOSCOPY (EGD) WITH PROPOFOL  N/A 05/31/2021   Procedure: ESOPHAGOGASTRODUODENOSCOPY (EGD) WITH PROPOFOL ;  Surgeon: Maryruth Ole DASEN, MD;  Location: [facility] ENDOSCOPY;  Service: Endoscopy;  Laterality: N/A;   FRACTURE SURGERY     left ankle complete repair, left elbow   HERNIA REPAIR     incisional   LAPAROSCOPIC GASTRIC  SLEEVE RESECTION  2014   PARATHYROIDECTOMY N/A 10/25/2018   Procedure: PARATHYROIDECTOMY, DIABETIC, SLEEP APNEA;  Surgeon: Marolyn Nest, MD;  Location: [facility] ORS;  Service: General;  Laterality: N/A;   TONSILLECTOMY AND ADENOIDECTOMY N/A 12/08/2015   Procedure: TONSILLECTOMY AND ADENOIDECTOMY;  Surgeon: Chinita Hasten, MD;  Location: [facility] ORS;  Service: ENT;  Laterality: N/A;   TOTAL HIP ARTHROPLASTY Left 09/22/2022   Procedure: Left posterior total hip arthroplasty;  Surgeon: Lorelle Hussar, MD;  Location: [facility] ORS;  Service: Orthopedics;  Laterality: Left;   TUBAL LIGATION     Patient Active Problem List   Diagnosis Date Noted   Osteoarthritis of left hip 09/22/2022   Side pain 08/16/2022   Musculoskeletal pain 08/16/2022   Varicose veins of both legs with edema 01/05/2021   Swelling of limb 12/30/2020   Diabetes (HCC) 12/30/2020   Lymphedema 12/30/2020   S/P subtotal parathyroidectomy 11/08/2018   Hyperparathyroidism 06/20/2018   Thyroid  nodule 06/20/2018   Intractable pain 04/12/2015    PCP:  Ophelia Sage, MD  REFERRING PROVIDER: Norleen Gavel, MD  REFERRING DIAG:  [phone] (ICD-10-CM) - Left shoulder pain  M25.562 (ICD-10-CM) - Left knee pain    THERAPY DIAG:  Acute pain of left shoulder  Decreased ROM of  left shoulder  Muscle weakness (generalized)  Rationale for Evaluation and Treatment: Rehabilitation  ONSET DATE: 08/30/23  SUBJECTIVE:                                                                                                                                                                                      SUBJECTIVE STATEMENT: Patient is late to PT session today, made aware of limited visit count per workers comp.   Hand dominance: Right  PERTINENT HISTORY: Pt was seen at [facility] ED on 08/30/23 after a fall sustained at work. Pt works in the emergency room and reported that she slipped on a wet spot and fell onto her left shoulder and knee to avoid falling  onto her L hip. Per ED note: Physical exam is generally reassuring. Shoulder joint itself has normal mobility and I am not concerned about dislocation. No evidence of clavicular injury. Mild proximal humerus fracture is possible and the patient is concerned about the fact that she has had multiple prior orthopedic injuries/fractures due to relatively minor falls. We will proceed with x-rays and anticipate restricted movement with shoulder sling, RICE, etc. Pt underwent L THA last June and has previous history of elbow surgery that prevents full movement of L wrist. Pt reports that she wore the sling provided in ED for a couple of weeks after the fall but has not worn it since.  PMH includes anemia, arthritis (back, L elbow, L hip, L ankle), asthma, DM, GERD, headache, hyperparathyroidism, lymphedema, seizure, sleep apnea  PAIN: Reported on L shoulder: Are you having pain? Yes: NPRS scale: 4/10 currently, worst 10/10 with movement, best: 4/10  Pain location: anterior shoulder Pain description: Sharp when it hits. Aggravating factors: reaching overhead/behind head, movement Relieving factors: Voltaren, Biofreeze, lidocaine  patches, rest  PRECAUTIONS: None  RED FLAGS: None   WEIGHT BEARING RESTRICTIONS: No  FALLS:  Has patient fallen in last 6 months? Yes. Number of falls 1- pt reports that this incident is her only fall  LIVING ENVIRONMENT: Lives with: lives with their spouse Lives in: Mobile home Stairs: Yes: External: 4 steps; can reach both Has following equipment at home: Single point cane and Walker - 2 wheeled  OCCUPATION: Works in ED at [employer]- pt reports that the pain hasn't affected her job duties that much because she is R handed  PLOF: Independent  PATIENT GOALS:  Pt wants to be able to hold her 26 month old grandson, get range of motion back to use her L arm normally.  NEXT MD VISIT: 10/12/23  OBJECTIVE:  Note: Objective measures were completed at Evaluation unless  otherwise noted.  DIAGNOSTIC FINDINGS:  DG Humerus L (08/30/23):  FINDINGS: There is no evidence of an acute fracture or dislocation. Radiopaque surgical screws and fixation wires are seen within the left olecranon process and left radial head. Soft tissues are unremarkable.   IMPRESSION: 1. No acute osseous abnormality. 2. Postoperative changes of the left elbow.   PATIENT SURVEYS:   QuickDASH Score: 68.2 / 100 = 68.2 %  COGNITION: Overall cognitive status: Within functional limits for tasks assessed     SENSATION: WFL- bilateral UE and LE  POSTURE: Pt holds L UE in guarded position at rest. L shoulder in sling upon arrival, but doffs with ease.   UPPER EXTREMITY ROM:   Active ROM Right eval Left PROM eval  Shoulder flexion 155 90  Shoulder extension    Shoulder abduction 120 90  Shoulder adduction    Shoulder internal rotation 75 NT  Shoulder external rotation 50 NT  Elbow flexion    Elbow extension    Wrist flexion    Wrist extension    Wrist ulnar deviation    Wrist radial deviation    Wrist pronation    Wrist supination    (Blank rows = not tested)   UPPER EXTREMITY MMT:  MMT Right eval Left eval  Shoulder flexion 4 NT  Shoulder extension    Shoulder abduction 3+ NT  Shoulder adduction    Shoulder internal rotation    Shoulder external rotation    Middle trapezius    Lower trapezius    Elbow flexion 4 NT  Elbow extension 4 NT  Wrist flexion    Wrist extension    Wrist ulnar deviation    Wrist radial deviation    Wrist pronation    Wrist supination    Grip strength (lbs)    (Blank rows = not tested)    PALPATION:  Pt with expected tenderness around the incision sites and still covered with the bandaging.     TREATMENT DATE: 04/01/2024   TherEx: Seated: Knees to hip IR 15x; increased motion with repetition Open abduction with cervical extension 15x  Median nerve glide LUE 15x seated Seated flexion 10x   AAROM with UE  ranger: -flexion:15x -abduction 15x -ABCs  Standing cervical side bend 15x     TherAct: Half foam roller flexion on wall 10x;  Wall posture 15x 2 sets Wall scapular pushups 15x  Swiss ball on table: -isometric press down 15x 3 second holds in flexion position; 15x3 in abduction position  -forward rollouts 15x    PATIENT EDUCATION: Education details: Exercise technique Person educated: Patient Education method: Explanation Education comprehension: verbalized understanding  HOME EXERCISE PROGRAM: Access Code: [email or web address] URL: [email or web address] Date: 12/11/2023 Prepared by: Sidra Simpers  Exercises - Circular Shoulder Pendulum with Table Support  - 1 x daily - 7 x weekly - 3 sets - 10 reps - Flexion-Extension Shoulder Pendulum with Table Support  - 1 x daily - 7 x weekly - 3 sets - 10 reps - Horizontal Shoulder Pendulum with Table Support  - 1 x daily - 7 x weekly - 3 sets - 10 reps - Supine Scapular Retraction  - 1 x daily - 7 x weekly - 3 sets - 10 reps - Seated Shoulder Shrug Circles AROM Backward  - 1 x daily - 7 x weekly - 3 sets - 10 reps - Wrist AROM Flexion Extension  - 1 x daily - 7 x weekly - 3 sets - 10 reps - Elbow Flexion PROM  - 1 x daily - 7 x weekly - 3 sets -  10 reps - Elbow Extension PROM  - 1 x daily - 7 x weekly - 3 sets - 10 reps - Forearm Pronation PROM  - 1 x daily - 7 x weekly - 3 sets - 10 reps - Forearm Supination PROM  - 1 x daily - 7 x weekly - 3 sets - 10 reps   Access Code: [email or web address]  URL: [email or web address]  Date: 10/05/2023  Prepared by: Daejon Lich  Exercises  - Standing shoulder flexion wall slides  - 1 x daily - 7 x weekly - 2 sets - 10 reps - 5 hold  - Standing Shoulder Abduction Slides at Wall  - 1 x daily - 7 x weekly - 2 sets - 10 reps - 5 hold  - Standing Isometric Shoulder Flexion with Doorway - Arm Bent  - 1 x daily - 7 x weekly - 2 sets - 10 reps - 5 hold  - Standing Isometric Shoulder Abduction  with Doorway - Arm Bent  - 1 x daily - 7 x weekly - 2 sets - 10 reps - 5 hold  - Standing Isometric Shoulder Internal Rotation at Doorway  - 1 x daily - 7 x weekly - 2 sets - 10 reps - 5 hold  - Standing Isometric Shoulder Extension with Doorway - Arm Bent  - 1 x daily - 7 x weekly - 2 sets - 10 reps - 5 hold  - Seated Shoulder Pendulum Exercise  - 1 x daily - 7 x weekly - 2 sets - 10 reps - 5 hold   ASSESSMENT:  CLINICAL IMPRESSION:   Patient session limited by late arrival. Patient educated on limited visit count per workers comp, will need new authorization/extension from workers comp for continuation of care. Patient verbalized understanding.  Pt will continue to benefit from skilled therapy to address remaining deficits in order to improve overall QoL and return to PLOF.         OBJECTIVE IMPAIRMENTS: Abnormal gait, decreased activity tolerance, decreased endurance, decreased mobility, difficulty walking, decreased ROM, decreased strength, hypomobility, increased edema, impaired perceived functional ability, impaired flexibility, impaired UE functional use, improper body mechanics, postural dysfunction, and pain.   ACTIVITY LIMITATIONS: carrying, lifting, bending, sitting, standing, squatting, stairs, transfers, bed mobility, bathing, dressing, self feeding, reach over head, hygiene/grooming, locomotion level, and caring for others  PARTICIPATION LIMITATIONS: meal prep, cleaning, laundry, driving, shopping, community activity, occupation, and yard work  PERSONAL FACTORS: Age, Fitness, Profession, and 3+ comorbidities: anemia, arthritis (back, L elbow, L hip, L ankle), asthma, DM, GERD, headache, hyperparathyroidism, lymphedema, seizure, sleep apnea are also affecting patient's functional outcome.   REHAB POTENTIAL: Good  CLINICAL DECISION MAKING: Evolving/moderate complexity  EVALUATION COMPLEXITY: Moderate   GOALS: Goals reviewed with patient? Yes  SHORT TERM GOALS: Target  date: 10/24/2023  Patient will be independent in home exercise program to improve strength/mobility for better functional independence with ADLs. Baseline:See HEP above; 11/17/2023=Patient able to verbalize and today demonstrate good understanding of HEP especially now since she has received the results from MRI.  Goal status: MET   LONG TERM GOALS: Target date:05/06/2024    Patient will decrease Quick DASH score by > 8 points demonstrating reduced self-reported upper extremity disability. Baseline: 68.2/100 03/05/24: 77.3/100 Goal status: PROGRESSING  2.  Patient will improve L shoulder AROM to > 140 degrees of flexion, scaption, and abduction for improved ability to perform overhead activities. Baseline: avoided AROM due to surgical procedure 01/10/24: avoided AROM due to surgical procedure 03/05/24: 103 deg  flexion AROM, 98 deg abduction Goal status: PROGRESSING  3.  Patient will report a worst pain of 6/10 on VAS in L shoulder to improve tolerance with ADLs and reduced symptoms with activities. Baseline: 10/10 at worst 01/10/24: 3/10 at worst 03/05/24: 10/10 at last visit Goal status: NOT MET  4.  Patient will be able to tolerate holding her young grandson without an increase in L shoulder pain to show improved L UE function and increase pt's ability to participate in family care. Baseline: Pt is unable to pick up grandson due to precautions of the L shoulder following RTC repair 01/10/24: Unable to do so at this time due to lifting restrictions and AROM restrictions 03/05/24: Pt was able to hold her grandson, but only able to hold him on her R side.   Goal status: ONGOING   PLAN:  PT FREQUENCY: 2x/week  PT DURATION: 8 weeks  PLANNED INTERVENTIONS: 97164- PT Re-evaluation, 97750- Physical Performance Testing, 97110-Therapeutic exercises, 97530- Therapeutic activity, [id number]- Neuromuscular re-education, 97535- Self Care, [id number]- Manual therapy, [id number]- Gait training, [phone]- Orthotic  Initial, [phone]- Orthotic/Prosthetic subsequent, [phone]- Canalith repositioning, [phone]- Splinting, [phone]- Electrical stimulation (manual), [id number]- Ultrasound, [id number]- Traction (mechanical), [id number]- Parrafin, 20560 (1-2 muscles), 20561 (3+ muscles)- Dry Needling, Patient/Family education, Balance training, Stair training, Taping, Joint mobilization, Spinal mobilization, Scar mobilization, Vestibular training, DME instructions, Cryotherapy, and Moist heat   PLAN FOR NEXT SESSION:   Continue with strengthening within pain tolerable range   Yamna Mackel  Leopoldo, PT, DPT Physical Therapist - [facility]  Outpatient Physical Therapy- Main Campus [phone]    04/01/2024, 8:45 AM  "

## 2024-04-01 NOTE — Therapy (Signed)
 " OUTPATIENT PHYSICAL THERAPY L SHOULDER TREATMENT    Patient Name: Ashley [name] MRN: [id number] DOB:1965-05-24, 59 y.o., female Today's Date: 04/03/2024  END OF SESSION:   PT End of Session - 04/03/24 0824     Visit Number 25    Number of Visits 37   corrected   Date for Recertification  05/06/24    PT Start Time 0820    PT Stop Time 0844    PT Time Calculation (min) 24 min    Activity Tolerance Patient tolerated treatment well;Patient limited by pain    Behavior During Therapy [facility] for tasks assessed/performed               Past Medical History:  Diagnosis Date   Allergy    Anemia    Arthritis    back, left elbow, left hip, left ankle    Asthma    Blood transfusion without reported diagnosis    Complication of anesthesia    delayed emergence   Diabetes mellitus without complication (HCC)    diet controlled   Diverticulosis    GERD (gastroesophageal reflux disease)    Headache    History of hiatal hernia    History of kidney stones    Hyperparathyroidism    Lymphedema    PONV (postoperative nausea and vomiting)    S/P subtotal parathyroidectomy    Seizure (HCC)    as a child   Sleep apnea    cannot tolerate mask   Thyroid  nodule 06/20/2018   Varicose vein of leg    Past Surgical History:  Procedure Laterality Date   ACETABULUM FRACTURE SURGERY Left    CESAREAN SECTION     CHOLECYSTECTOMY     COLONOSCOPY WITH PROPOFOL  N/A 05/31/2021   Procedure: COLONOSCOPY WITH PROPOFOL ;  Surgeon: Maryruth Ole DASEN, MD;  Location: [facility] ENDOSCOPY;  Service: Endoscopy;  Laterality: N/A;   ELBOW FRACTURE SURGERY  1995   ESOPHAGOGASTRODUODENOSCOPY (EGD) WITH PROPOFOL  N/A 05/31/2021   Procedure: ESOPHAGOGASTRODUODENOSCOPY (EGD) WITH PROPOFOL ;  Surgeon: Maryruth Ole DASEN, MD;  Location: [facility] ENDOSCOPY;  Service: Endoscopy;  Laterality: N/A;   FRACTURE SURGERY     left ankle complete repair, left elbow   HERNIA REPAIR     incisional   LAPAROSCOPIC  GASTRIC SLEEVE RESECTION  2014   PARATHYROIDECTOMY N/A 10/25/2018   Procedure: PARATHYROIDECTOMY, DIABETIC, SLEEP APNEA;  Surgeon: Marolyn Nest, MD;  Location: [facility] ORS;  Service: General;  Laterality: N/A;   TONSILLECTOMY AND ADENOIDECTOMY N/A 12/08/2015   Procedure: TONSILLECTOMY AND ADENOIDECTOMY;  Surgeon: Chinita Hasten, MD;  Location: [facility] ORS;  Service: ENT;  Laterality: N/A;   TOTAL HIP ARTHROPLASTY Left 09/22/2022   Procedure: Left posterior total hip arthroplasty;  Surgeon: Lorelle Hussar, MD;  Location: [facility] ORS;  Service: Orthopedics;  Laterality: Left;   TUBAL LIGATION     Patient Active Problem List   Diagnosis Date Noted   Osteoarthritis of left hip 09/22/2022   Side pain 08/16/2022   Musculoskeletal pain 08/16/2022   Varicose veins of both legs with edema 01/05/2021   Swelling of limb 12/30/2020   Diabetes (HCC) 12/30/2020   Lymphedema 12/30/2020   S/P subtotal parathyroidectomy 11/08/2018   Hyperparathyroidism 06/20/2018   Thyroid  nodule 06/20/2018   Intractable pain 04/12/2015    PCP:  Ophelia Sage, MD  REFERRING PROVIDER: Norleen Gavel, MD  REFERRING DIAG:  [phone] (ICD-10-CM) - Left shoulder pain  M25.562 (ICD-10-CM) - Left knee pain    THERAPY DIAG:  Acute pain of left shoulder  Decreased ROM  of left shoulder  Muscle weakness (generalized)  Rationale for Evaluation and Treatment: Rehabilitation  ONSET DATE: 08/30/23  SUBJECTIVE:                                                                                                                                                                                      SUBJECTIVE STATEMENT:  Patient arrived late to PT session limiting session duration. Patient reports pain is a 2/10  Hand dominance: Right  PERTINENT HISTORY: Pt was seen at [facility] ED on 08/30/23 after a fall sustained at work. Pt works in the emergency room and reported that she slipped on a wet spot and fell onto her left shoulder and knee to  avoid falling onto her L hip. Per ED note: Physical exam is generally reassuring. Shoulder joint itself has normal mobility and I am not concerned about dislocation. No evidence of clavicular injury. Mild proximal humerus fracture is possible and the patient is concerned about the fact that she has had multiple prior orthopedic injuries/fractures due to relatively minor falls. We will proceed with x-rays and anticipate restricted movement with shoulder sling, RICE, etc. Pt underwent L THA last June and has previous history of elbow surgery that prevents full movement of L wrist. Pt reports that she wore the sling provided in ED for a couple of weeks after the fall but has not worn it since.  PMH includes anemia, arthritis (back, L elbow, L hip, L ankle), asthma, DM, GERD, headache, hyperparathyroidism, lymphedema, seizure, sleep apnea  PAIN: Reported on L shoulder: Are you having pain? Yes: NPRS scale: 4/10 currently, worst 10/10 with movement, best: 4/10  Pain location: anterior shoulder Pain description: Sharp when it hits. Aggravating factors: reaching overhead/behind head, movement Relieving factors: Voltaren, Biofreeze, lidocaine  patches, rest  PRECAUTIONS: None  RED FLAGS: None   WEIGHT BEARING RESTRICTIONS: No  FALLS:  Has patient fallen in last 6 months? Yes. Number of falls 1- pt reports that this incident is her only fall  LIVING ENVIRONMENT: Lives with: lives with their spouse Lives in: Mobile home Stairs: Yes: External: 4 steps; can reach both Has following equipment at home: Single point cane and Walker - 2 wheeled  OCCUPATION: Works in ED at [employer]- pt reports that the pain hasn't affected her job duties that much because she is R handed  PLOF: Independent  PATIENT GOALS:  Pt wants to be able to hold her 97 month old grandson, get range of motion back to use her L arm normally.  NEXT MD VISIT: 10/12/23  OBJECTIVE:  Note: Objective measures were completed at Evaluation  unless otherwise noted.  DIAGNOSTIC FINDINGS:  DG Humerus L (08/30/23):  FINDINGS: There is no evidence of an acute fracture or dislocation. Radiopaque surgical screws and fixation wires are seen within the left olecranon process and left radial head. Soft tissues are unremarkable.   IMPRESSION: 1. No acute osseous abnormality. 2. Postoperative changes of the left elbow.   PATIENT SURVEYS:   QuickDASH Score: 68.2 / 100 = 68.2 %  COGNITION: Overall cognitive status: Within functional limits for tasks assessed     SENSATION: WFL- bilateral UE and LE  POSTURE: Pt holds L UE in guarded position at rest. L shoulder in sling upon arrival, but doffs with ease.   UPPER EXTREMITY ROM:   Active ROM Right eval Left PROM eval  Shoulder flexion 155 90  Shoulder extension    Shoulder abduction 120 90  Shoulder adduction    Shoulder internal rotation 75 NT  Shoulder external rotation 50 NT  Elbow flexion    Elbow extension    Wrist flexion    Wrist extension    Wrist ulnar deviation    Wrist radial deviation    Wrist pronation    Wrist supination    (Blank rows = not tested)   UPPER EXTREMITY MMT:  MMT Right eval Left eval  Shoulder flexion 4 NT  Shoulder extension    Shoulder abduction 3+ NT  Shoulder adduction    Shoulder internal rotation    Shoulder external rotation    Middle trapezius    Lower trapezius    Elbow flexion 4 NT  Elbow extension 4 NT  Wrist flexion    Wrist extension    Wrist ulnar deviation    Wrist radial deviation    Wrist pronation    Wrist supination    Grip strength (lbs)    (Blank rows = not tested)    PALPATION:  Pt with expected tenderness around the incision sites and still covered with the bandaging.     TREATMENT DATE: 04/03/2024   TherEx: Seated: Knees to hip IR 15x; increased motion with repetition Open abduction with cervical extension 15x  Median nerve glide LUE 15x seated Seated flexion 10x  Flexion holding green  ball 15x Twists holding green ball 15x   Standing cervical side bend 15x     TherAct: Green ball flexion 15x Green ball alphabet on wall  Green ball tricep press 15x    PATIENT EDUCATION: Education details: Exercise technique Person educated: Patient Education method: Explanation Education comprehension: verbalized understanding  HOME EXERCISE PROGRAM: Access Code: [email or web address] URL: [email or web address] Date: 12/11/2023 Prepared by: Sidra Simpers  Exercises - Circular Shoulder Pendulum with Table Support  - 1 x daily - 7 x weekly - 3 sets - 10 reps - Flexion-Extension Shoulder Pendulum with Table Support  - 1 x daily - 7 x weekly - 3 sets - 10 reps - Horizontal Shoulder Pendulum with Table Support  - 1 x daily - 7 x weekly - 3 sets - 10 reps - Supine Scapular Retraction  - 1 x daily - 7 x weekly - 3 sets - 10 reps - Seated Shoulder Shrug Circles AROM Backward  - 1 x daily - 7 x weekly - 3 sets - 10 reps - Wrist AROM Flexion Extension  - 1 x daily - 7 x weekly - 3 sets - 10 reps - Elbow Flexion PROM  - 1 x daily - 7 x weekly - 3 sets - 10 reps - Elbow Extension PROM  - 1 x daily - 7 x weekly - 3 sets - 10 reps -  Forearm Pronation PROM  - 1 x daily - 7 x weekly - 3 sets - 10 reps - Forearm Supination PROM  - 1 x daily - 7 x weekly - 3 sets - 10 reps   Access Code: [email or web address]  URL: [email or web address]  Date: 10/05/2023  Prepared by: Nel Stoneking  Exercises  - Standing shoulder flexion wall slides  - 1 x daily - 7 x weekly - 2 sets - 10 reps - 5 hold  - Standing Shoulder Abduction Slides at Wall  - 1 x daily - 7 x weekly - 2 sets - 10 reps - 5 hold  - Standing Isometric Shoulder Flexion with Doorway - Arm Bent  - 1 x daily - 7 x weekly - 2 sets - 10 reps - 5 hold  - Standing Isometric Shoulder Abduction with Doorway - Arm Bent  - 1 x daily - 7 x weekly - 2 sets - 10 reps - 5 hold  - Standing Isometric Shoulder Internal Rotation at Doorway  - 1 x  daily - 7 x weekly - 2 sets - 10 reps - 5 hold  - Standing Isometric Shoulder Extension with Doorway - Arm Bent  - 1 x daily - 7 x weekly - 2 sets - 10 reps - 5 hold  - Seated Shoulder Pendulum Exercise  - 1 x daily - 7 x weekly - 2 sets - 10 reps - 5 hold   ASSESSMENT:  CLINICAL IMPRESSION:   Patient made aware today is last scheduled appt until given approval by workers comp.  Patient arrived late to PT session limiting session duration. Pt will continue to benefit from skilled therapy to address remaining deficits in order to improve overall QoL and return to PLOF.         OBJECTIVE IMPAIRMENTS: Abnormal gait, decreased activity tolerance, decreased endurance, decreased mobility, difficulty walking, decreased ROM, decreased strength, hypomobility, increased edema, impaired perceived functional ability, impaired flexibility, impaired UE functional use, improper body mechanics, postural dysfunction, and pain.   ACTIVITY LIMITATIONS: carrying, lifting, bending, sitting, standing, squatting, stairs, transfers, bed mobility, bathing, dressing, self feeding, reach over head, hygiene/grooming, locomotion level, and caring for others  PARTICIPATION LIMITATIONS: meal prep, cleaning, laundry, driving, shopping, community activity, occupation, and yard work  PERSONAL FACTORS: Age, Fitness, Profession, and 3+ comorbidities: anemia, arthritis (back, L elbow, L hip, L ankle), asthma, DM, GERD, headache, hyperparathyroidism, lymphedema, seizure, sleep apnea are also affecting patient's functional outcome.   REHAB POTENTIAL: Good  CLINICAL DECISION MAKING: Evolving/moderate complexity  EVALUATION COMPLEXITY: Moderate   GOALS: Goals reviewed with patient? Yes  SHORT TERM GOALS: Target date: 10/24/2023  Patient will be independent in home exercise program to improve strength/mobility for better functional independence with ADLs. Baseline:See HEP above; 11/17/2023=Patient able to verbalize and today  demonstrate good understanding of HEP especially now since she has received the results from MRI.  Goal status: MET   LONG TERM GOALS: Target date:05/06/2024    Patient will decrease Quick DASH score by > 8 points demonstrating reduced self-reported upper extremity disability. Baseline: 68.2/100 03/05/24: 77.3/100 Goal status: PROGRESSING  2.  Patient will improve L shoulder AROM to > 140 degrees of flexion, scaption, and abduction for improved ability to perform overhead activities. Baseline: avoided AROM due to surgical procedure 01/10/24: avoided AROM due to surgical procedure 03/05/24: 103 deg flexion AROM, 98 deg abduction Goal status: PROGRESSING  3.  Patient will report a worst pain of 6/10 on VAS in L shoulder to improve tolerance with  ADLs and reduced symptoms with activities. Baseline: 10/10 at worst 01/10/24: 3/10 at worst 03/05/24: 10/10 at last visit Goal status: NOT MET  4.  Patient will be able to tolerate holding her young grandson without an increase in L shoulder pain to show improved L UE function and increase pt's ability to participate in family care. Baseline: Pt is unable to pick up grandson due to precautions of the L shoulder following RTC repair 01/10/24: Unable to do so at this time due to lifting restrictions and AROM restrictions 03/05/24: Pt was able to hold her grandson, but only able to hold him on her R side.   Goal status: ONGOING   PLAN:  PT FREQUENCY: 2x/week  PT DURATION: 8 weeks  PLANNED INTERVENTIONS: 97164- PT Re-evaluation, 97750- Physical Performance Testing, 97110-Therapeutic exercises, 97530- Therapeutic activity, [id number]- Neuromuscular re-education, 97535- Self Care, [id number]- Manual therapy, [id number]- Gait training, [phone]- Orthotic Initial, [phone]- Orthotic/Prosthetic subsequent, [phone]- Canalith repositioning, [phone]- Splinting, [phone]- Electrical stimulation (manual), [id number]- Ultrasound, [id number]- Traction (mechanical), [id number]- Parrafin, 20560 (1-2  muscles), 20561 (3+ muscles)- Dry Needling, Patient/Family education, Balance training, Stair training, Taping, Joint mobilization, Spinal mobilization, Scar mobilization, Vestibular training, DME instructions, Cryotherapy, and Moist heat   PLAN FOR NEXT SESSION:   Continue with strengthening within pain tolerable range   Frazer [place]  Leopoldo, PT, DPT Physical Therapist - [facility]  Outpatient Physical Therapy- Main Campus [phone]    04/03/2024, 8:46 AM  "

## 2024-04-03 ENCOUNTER — Ambulatory Visit: Payer: Worker's Compensation

## 2024-04-03 DIAGNOSIS — M6281 Muscle weakness (generalized): Secondary | ICD-10-CM

## 2024-04-03 DIAGNOSIS — M25512 Pain in left shoulder: Secondary | ICD-10-CM

## 2024-04-03 DIAGNOSIS — M25612 Stiffness of left shoulder, not elsewhere classified: Secondary | ICD-10-CM

## 2024-04-04 ENCOUNTER — Other Ambulatory Visit: Payer: Self-pay

## 2024-04-05 ENCOUNTER — Other Ambulatory Visit: Payer: Self-pay

## 2024-04-10 ENCOUNTER — Ambulatory Visit: Payer: Worker's Compensation

## 2024-04-10 DIAGNOSIS — M25512 Pain in left shoulder: Secondary | ICD-10-CM | POA: Diagnosis not present

## 2024-04-10 DIAGNOSIS — M6281 Muscle weakness (generalized): Secondary | ICD-10-CM

## 2024-04-10 DIAGNOSIS — M25562 Pain in left knee: Secondary | ICD-10-CM

## 2024-04-10 DIAGNOSIS — M25612 Stiffness of left shoulder, not elsewhere classified: Secondary | ICD-10-CM

## 2024-04-10 NOTE — Therapy (Signed)
 " OUTPATIENT PHYSICAL THERAPY L SHOULDER TREATMENT    Patient Name: Ashley [name] MRN: [id number] DOB:[birth year], 59 y.o., female Today's Date: 04/10/2024  END OF SESSION:   PT End of Session - 04/10/24 0800     Visit Number 26    Number of Visits 37   corrected   Date for Recertification  05/06/24    PT Start Time 0800    PT Stop Time 0842    PT Time Calculation (min) 42 min    Activity Tolerance Patient tolerated treatment well;Patient limited by pain    Behavior During Therapy [facility] for tasks assessed/performed               Past Medical History:  Diagnosis Date   Allergy    Anemia    Arthritis    back, left elbow, left hip, left ankle    Asthma    Blood transfusion without reported diagnosis    Complication of anesthesia    delayed emergence   Diabetes mellitus without complication (HCC)    diet controlled   Diverticulosis    GERD (gastroesophageal reflux disease)    Headache    History of hiatal hernia    History of kidney stones    Hyperparathyroidism    Lymphedema    PONV (postoperative nausea and vomiting)    S/P subtotal parathyroidectomy    Seizure (HCC)    as a child   Sleep apnea    cannot tolerate mask   Thyroid  nodule 06/20/2018   Varicose vein of leg    Past Surgical History:  Procedure Laterality Date   ACETABULUM FRACTURE SURGERY Left    CESAREAN SECTION     CHOLECYSTECTOMY     COLONOSCOPY WITH PROPOFOL  N/A 05/31/2021   Procedure: COLONOSCOPY WITH PROPOFOL ;  Surgeon: Maryruth Ole DASEN, MD;  Location: [facility];  Service: Endoscopy;  Laterality: N/A;   ELBOW FRACTURE SURGERY  1995   ESOPHAGOGASTRODUODENOSCOPY (EGD) WITH PROPOFOL  N/A 05/31/2021   Procedure: ESOPHAGOGASTRODUODENOSCOPY (EGD) WITH PROPOFOL ;  Surgeon: Maryruth Ole DASEN, MD;  Location: [facility] END[facility];  Service: Endoscopy;  Laterality: N/A;   FRACTURE SURGERY     left ankle complete repair, left elbow   HERNIA REPAIR     incisional   LAPAROSCOPIC  GASTRIC SLEEVE RESECTION  2014   PARATHYROIDECTOMY N/A 10/25/2018   Procedure: PARATHYROIDECTOMY, DIABETIC, SLEEP APNEA;  Surgeon: Marolyn Nest, MD;  Location: [facility] OR[facility];  Service: General;  Laterality: N/A;   TONSILLECTOMY AND ADENOIDECTOMY N/A 12/08/2015   Procedure: TONSILLECTOMY AND ADENOIDECTOMY;  Surgeon: Chinita Hasten, MD;  Location: [facility] OR[facility];  Service: ENT;  Laterality: N/A;   TOTAL HIP ARTHROPLASTY Left 09/22/2022   Procedure: Left posterior total hip arthroplasty;  Surgeon: Lorelle Hussar, MD;  Location: [facility] OR[facility];  Service: Orthopedics;  Laterality: Left;   TUBAL LIGATION     Patient Active Problem List   Diagnosis Date Noted   Osteoarthritis of left hip 09/22/2022   Side pain 08/16/2022   Musculoskeletal pain 08/16/2022   Varicose veins of both legs with edema 01/05/2021   Swelling of limb 12/30/2020   Diabetes (HCC) 12/30/2020   Lymphedema 12/30/2020   S/P subtotal parathyroidectomy 11/08/2018   Hyperparathyroidism 06/20/2018   Thyroid  nodule 06/20/2018   Intractable pain 04/12/2015    PCP:  Ophelia Sage, MD  REFERRING PROVIDER: Norleen Gavel, MD  REFERRING DIAG:  [phone] (ICD-10-CM) - Left shoulder pain  M25.562 (ICD-10-CM) - Left knee pain    THERAPY DIAG:  Acute pain of left shoulder  Decreased ROM  of left shoulder  Muscle weakness (generalized)  Acute pain of left knee  Rationale for Evaluation and Treatment: Rehabilitation  ONSET DATE: 08/30/23  SUBJECTIVE:                                                                                                                                                                                      SUBJECTIVE STATEMENT:  Patient reports she is tired as she just got off work. Pt reports pain is about a 4 1/2.   Hand dominance: Right  PERTINENT HISTORY: Pt was seen at [facility] ED on 08/30/23 after a fall sustained at work. Pt works in the emergency room and reported that she slipped on a wet spot and fell onto her  left shoulder and knee to avoid falling onto her L hip. Per ED note: Physical exam is generally reassuring. Shoulder joint itself has normal mobility and I am not concerned about dislocation. No evidence of clavicular injury. Mild proximal humerus fracture is possible and the patient is concerned about the fact that she has had multiple prior orthopedic injuries/fractures due to relatively minor falls. We will proceed with x-rays and anticipate restricted movement with shoulder sling, RICE, etc. Pt underwent L THA last June and has previous history of elbow surgery that prevents full movement of L wrist. Pt reports that she wore the sling provided in ED for a couple of weeks after the fall but has not worn it since.  PMH includes anemia, arthritis (back, L elbow, L hip, L ankle), asthma, DM, GERD, headache, hyperparathyroidism, lymphedema, seizure, sleep apnea  PAIN: Reported on L shoulder: Are you having pain? Yes: NPRS scale: 4/10 currently, worst 10/10 with movement, best: 4/10  Pain location: anterior shoulder Pain description: Sharp when it hits. Aggravating factors: reaching overhead/behind head, movement Relieving factors: Voltaren, Biofreeze, lidocaine  patches, rest  PRECAUTIONS: None  RED FLAGS: None   WEIGHT BEARING RESTRICTIONS: No  FALLS:  Has patient fallen in last 6 months? Yes. Number of falls 1- pt reports that this incident is her only fall  LIVING ENVIRONMENT: Lives with: lives with their spouse Lives in: Mobile home Stairs: Yes: External: 4 steps; can reach both Has following equipment at home: Single point cane and Walker - 2 wheeled  OCCUPATION: Works in ED at [employer]- pt reports that the pain hasn't affected her job duties that much because she is R handed  PLOF: Independent  PATIENT GOALS:  Pt wants to be able to hold her 66 month old grandson, get range of motion back to use her L arm normally.  NEXT MD VISIT: 10/12/23  OBJECTIVE:  Note: Objective measures  were completed at Evaluation  unless otherwise noted.  DIAGNOSTIC FINDINGS:  DG Humerus L (08/30/23): FINDINGS: There is no evidence of an acute fracture or dislocation. Radiopaque surgical screws and fixation wires are seen within the left olecranon process and left radial head. Soft tissues are unremarkable.   IMPRESSION: 1. No acute osseous abnormality. 2. Postoperative changes of the left elbow.   PATIENT SURVEYS:   QuickDASH Score: 68.2 / 100 = 68.2 %  COGNITION: Overall cognitive status: Within functional limits for tasks assessed     SENSATION: WFL- bilateral UE and LE  POSTURE: Pt holds L UE in guarded position at rest. L shoulder in sling upon arrival, but doffs with ease.   UPPER EXTREMITY ROM:   Active ROM Right eval Left PROM eval  Shoulder flexion 155 90  Shoulder extension    Shoulder abduction 120 90  Shoulder adduction    Shoulder internal rotation 75 NT  Shoulder external rotation 50 NT  Elbow flexion    Elbow extension    Wrist flexion    Wrist extension    Wrist ulnar deviation    Wrist radial deviation    Wrist pronation    Wrist supination    (Blank rows = not tested)   UPPER EXTREMITY MMT:  MMT Right eval Left eval  Shoulder flexion 4 NT  Shoulder extension    Shoulder abduction 3+ NT  Shoulder adduction    Shoulder internal rotation    Shoulder external rotation    Middle trapezius    Lower trapezius    Elbow flexion 4 NT  Elbow extension 4 NT  Wrist flexion    Wrist extension    Wrist ulnar deviation    Wrist radial deviation    Wrist pronation    Wrist supination    Grip strength (lbs)    (Blank rows = not tested)    PALPATION:  Pt with expected tenderness around the incision sites and still covered with the bandaging.     TREATMENT DATE: 04/10/2024   TherEx: -Standing wall slides: flex: 2x10, Abd: 1x10, 1x6 -Supine: shoulder press + protraction. 2x10 -Supine flexion: 2x10 with elbow slightly flexed.    -Side-lying ER: 2x10 with roll under humerus. -Side-lying abduction: 1x10  -Prone Rows: 2x10 -Prone Ext: 2x10  -WELLZONE: UBE Introduction: 1' forward/ 1' backward (Ran out of time for longer use).    PATIENT EDUCATION: Education details: Exercise technique Person educated: Patient Education method: Explanation Education comprehension: verbalized understanding  HOME EXERCISE PROGRAM: Access Code: [email or web address] URL: [email or web address] Date: 12/11/2023 Prepared by: Sidra Simpers  Exercises - Circular Shoulder Pendulum with Table Support  - 1 x daily - 7 x weekly - 3 sets - 10 reps - Flexion-Extension Shoulder Pendulum with Table Support  - 1 x daily - 7 x weekly - 3 sets - 10 reps - Horizontal Shoulder Pendulum with Table Support  - 1 x daily - 7 x weekly - 3 sets - 10 reps - Supine Scapular Retraction  - 1 x daily - 7 x weekly - 3 sets - 10 reps - Seated Shoulder Shrug Circles AROM Backward  - 1 x daily - 7 x weekly - 3 sets - 10 reps - Wrist AROM Flexion Extension  - 1 x daily - 7 x weekly - 3 sets - 10 reps - Elbow Flexion PROM  - 1 x daily - 7 x weekly - 3 sets - 10 reps - Elbow Extension PROM  - 1 x daily - 7 x weekly - 3 sets - 10  reps - Forearm Pronation PROM  - 1 x daily - 7 x weekly - 3 sets - 10 reps - Forearm Supination PROM  - 1 x daily - 7 x weekly - 3 sets - 10 reps   Access Code: [email or web address]  URL: [email or web address]  Date: 10/05/2023  Prepared by: Gilberto Custard  Exercises  - Standing shoulder flexion wall slides  - 1 x daily - 7 x weekly - 2 sets - 10 reps - 5 hold  - Standing Shoulder Abduction Slides at Wall  - 1 x daily - 7 x weekly - 2 sets - 10 reps - 5 hold  - Standing Isometric Shoulder Flexion with Doorway - Arm Bent  - 1 x daily - 7 x weekly - 2 sets - 10 reps - 5 hold  - Standing Isometric Shoulder Abduction with Doorway - Arm Bent  - 1 x daily - 7 x weekly - 2 sets - 10 reps - 5 hold  - Standing Isometric Shoulder Internal  Rotation at Doorway  - 1 x daily - 7 x weekly - 2 sets - 10 reps - 5 hold  - Standing Isometric Shoulder Extension with Doorway - Arm Bent  - 1 x daily - 7 x weekly - 2 sets - 10 reps - 5 hold  - Seated Shoulder Pendulum Exercise  - 1 x daily - 7 x weekly - 2 sets - 10 reps - 5 hold   ASSESSMENT:  CLINICAL IMPRESSION:   Patient focused on improving ROM and strength in L shoulder with AROM/AAROM exercises today. Patient noted to have decreased L shoulder ROM and strength throughout treatment session. She continues to be highly motivated to improve throughout her treatment today. Finished treatment with WellZone visit introducing patient to UBE and recommending she use it during her break at work. Patient did improve throughout treatment with less guarding during new movements/exercises presented today. Patient will benefit from continuing skilled PT for further improvement toward her goals.    OBJECTIVE IMPAIRMENTS: Abnormal gait, decreased activity tolerance, decreased endurance, decreased mobility, difficulty walking, decreased ROM, decreased strength, hypomobility, increased edema, impaired perceived functional ability, impaired flexibility, impaired UE functional use, improper body mechanics, postural dysfunction, and pain.   ACTIVITY LIMITATIONS: carrying, lifting, bending, sitting, standing, squatting, stairs, transfers, bed mobility, bathing, dressing, self feeding, reach over head, hygiene/grooming, locomotion level, and caring for others  PARTICIPATION LIMITATIONS: meal prep, cleaning, laundry, driving, shopping, community activity, occupation, and yard work  PERSONAL FACTORS: Age, Fitness, Profession, and 3+ comorbidities: anemia, arthritis (back, L elbow, L hip, L ankle), asthma, DM, GERD, headache, hyperparathyroidism, lymphedema, seizure, sleep apnea are also affecting patient's functional outcome.   REHAB POTENTIAL: Good  CLINICAL DECISION MAKING: Evolving/moderate  complexity  EVALUATION COMPLEXITY: Moderate   GOALS: Goals reviewed with patient? Yes  SHORT TERM GOALS: Target date: 10/24/2023  Patient will be independent in home exercise program to improve strength/mobility for better functional independence with ADLs. Baseline:See HEP above; 11/17/2023=Patient able to verbalize and today demonstrate good understanding of HEP especially now since she has received the results from MRI.  Goal status: MET   LONG TERM GOALS: Target date:05/06/2024    Patient will decrease Quick DASH score by > 8 points demonstrating reduced self-reported upper extremity disability. Baseline: 68.2/100 03/05/24: 77.3/100 Goal status: PROGRESSING  2.  Patient will improve L shoulder AROM to > 140 degrees of flexion, scaption, and abduction for improved ability to perform overhead activities. Baseline: avoided AROM due to surgical procedure 01/10/24: avoided AROM  due to surgical procedure 03/05/24: 103 deg flexion AROM, 98 deg abduction Goal status: PROGRESSING  3.  Patient will report a worst pain of 6/10 on VAS in L shoulder to improve tolerance with ADLs and reduced symptoms with activities. Baseline: 10/10 at worst 01/10/24: 3/10 at worst 03/05/24: 10/10 at last visit Goal status: NOT MET  4.  Patient will be able to tolerate holding her young grandson without an increase in L shoulder pain to show improved L UE function and increase pt's ability to participate in family care. Baseline: Pt is unable to pick up grandson due to precautions of the L shoulder following RTC repair 01/10/24: Unable to do so at this time due to lifting restrictions and AROM restrictions 03/05/24: Pt was able to hold her grandson, but only able to hold him on her R side.   Goal status: ONGOING   PLAN:  PT FREQUENCY: 2x/week  PT DURATION: 8 weeks  PLANNED INTERVENTIONS: 97164- PT Re-evaluation, 97750- Physical Performance Testing, 97110-Therapeutic exercises, 97530- Therapeutic  activity, [id number]- Neuromuscular re-education, 97535- Self Care, [id number]- Manual therapy, [id number]- Gait training, [id number]- Orthotic Initial, [phone]- Orthotic/Prosthetic subsequent, [phone]- Canalith repositioning, [id number]- Splinting, [phone]- Electrical stimulation (manual), [id number]- Ultrasound, [id number]- Traction (mechanical), [id number]- Parrafin, 20560 (1-2 muscles), [id number] (3+ muscles)- Dry Needling, Patient/Family education, Balance training, Stair training, Taping, Joint mobilization, Spinal mobilization, Scar mobilization, Vestibular training, DME instructions, Cryotherapy, and Moist heat   PLAN FOR NEXT SESSION:   Continue with strengthening within pain tolerable range   Norman Sharps, PT, DPT Physical Therapist - [facility]   04/10/2024, 10:15 AM  "

## 2024-04-11 ENCOUNTER — Ambulatory Visit

## 2024-04-11 DIAGNOSIS — M25512 Pain in left shoulder: Secondary | ICD-10-CM

## 2024-04-11 DIAGNOSIS — M25612 Stiffness of left shoulder, not elsewhere classified: Secondary | ICD-10-CM

## 2024-04-11 DIAGNOSIS — M25562 Pain in left knee: Secondary | ICD-10-CM

## 2024-04-11 DIAGNOSIS — M6281 Muscle weakness (generalized): Secondary | ICD-10-CM

## 2024-04-11 NOTE — Therapy (Signed)
 " OUTPATIENT PHYSICAL THERAPY  TREATMENT   Patient Name: Ashley [name] MRN: [id number] DOB:1965/11/02, 59 y.o., female Today's Date: 04/11/2024  END OF SESSION:   PT End of Session - 04/11/24 0806     Visit Number 27    Number of Visits 37    Date for Recertification  05/06/24    Authorization Type Workers Comp    Authorization Time Period 12 visits starting 04/05/24    Authorization - Visit Number 2    Authorization - Number of Visits 12    Progress Note Due on Visit 30    PT Start Time 0800    PT Stop Time 0840    PT Time Calculation (min) 40 min    Activity Tolerance Patient tolerated treatment well;Patient limited by pain    Behavior During Therapy WFL for tasks assessed/performed           Past Medical History:  Diagnosis Date   Allergy    Anemia    Arthritis    back, left elbow, left hip, left ankle    Asthma    Blood transfusion without reported diagnosis    Complication of anesthesia    delayed emergence   Diabetes mellitus without complication (HCC)    diet controlled   Diverticulosis    GERD (gastroesophageal reflux disease)    Headache    History of hiatal hernia    History of kidney stones    Hyperparathyroidism    Lymphedema    PONV (postoperative nausea and vomiting)    S/P subtotal parathyroidectomy    Seizure (HCC)    as a child   Sleep apnea    cannot tolerate mask   Thyroid  nodule 06/20/2018   Varicose vein of leg    Past Surgical History:  Procedure Laterality Date   ACETABULUM FRACTURE SURGERY Left    CESAREAN SECTION     CHOLECYSTECTOMY     COLONOSCOPY WITH PROPOFOL  N/A 05/31/2021   Procedure: COLONOSCOPY WITH PROPOFOL ;  Surgeon: Maryruth Ole DASEN, MD;  Location: [facility];  Service: Endoscopy;  Laterality: N/A;   ELBOW FRACTURE SURGERY  1995   ESOPHAGOGASTRODUODENOSCOPY (EGD) WITH PROPOFOL  N/A 05/31/2021   Procedure: ESOPHAGOGASTRODUODENOSCOPY (EGD) WITH PROPOFOL ;  Surgeon: Maryruth Ole DASEN, MD;  Location:  [facility];  Service: Endoscopy;  Laterality: N/A;   FRACTURE SURGERY     left ankle complete repair, left elbow   HERNIA REPAIR     incisional   LAPAROSCOPIC GASTRIC SLEEVE RESECTION  2014   PARATHYROIDECTOMY N/A 10/25/2018   Procedure: PARATHYROIDECTOMY, DIABETIC, SLEEP APNEA;  Surgeon: Marolyn Nest, MD;  Location: [facility];  Service: General;  Laterality: N/A;   TONSILLECTOMY AND ADENOIDECTOMY N/A 12/08/2015   Procedure: TONSILLECTOMY AND ADENOIDECTOMY;  Surgeon: Chinita Hasten, MD;  Location: [facility];  Service: ENT;  Laterality: N/A;   TOTAL HIP ARTHROPLASTY Left 09/22/2022   Procedure: Left posterior total hip arthroplasty;  Surgeon: Lorelle Hussar, MD;  Location: [facility] OR[facility];  Service: Orthopedics;  Laterality: Left;   TUBAL LIGATION     Patient Active Problem List   Diagnosis Date Noted   Osteoarthritis of left hip 09/22/2022   Side pain 08/16/2022   Musculoskeletal pain 08/16/2022   Varicose veins of both legs with edema 01/05/2021   Swelling of limb 12/30/2020   Diabetes (HCC) 12/30/2020   Lymphedema 12/30/2020   S/P subtotal parathyroidectomy 11/08/2018   Hyperparathyroidism 06/20/2018   Thyroid  nodule 06/20/2018   Intractable pain 04/12/2015    PCP:  Ophelia Sage, MD  REFERRING  PROVIDER: Norleen Gavel, MD  REFERRING DIAG:  [phone] (ICD-10-CM) - Left shoulder pain  M25.562 (ICD-10-CM) - Left knee pain    THERAPY DIAG:  Acute pain of left shoulder  Decreased ROM of left shoulder  Muscle weakness (generalized)  Acute pain of left knee  Rationale for Evaluation and Treatment: Rehabilitation  ONSET DATE: 08/30/23  SUBJECTIVE:                                                                                                                                                                                      SUBJECTIVE STATEMENT: Patient reports she is tired as she just got off work. .   Hand dominance: Right  PERTINENT HISTORY: Pt was seen at [facility] ED on  08/30/23 after a fall sustained at work. Pt works in the emergency room and reported that she slipped on a wet spot and fell onto her left shoulder and knee to avoid falling onto her L hip. Per ED note: Physical exam is generally reassuring. Shoulder joint itself has normal mobility and I am not concerned about dislocation. No evidence of clavicular injury. Mild proximal humerus fracture is possible and the patient is concerned about the fact that she has had multiple prior orthopedic injuries/fractures due to relatively minor falls. We will proceed with x-rays and anticipate restricted movement with shoulder sling, RICE, etc. Pt underwent L THA last June and has previous history of elbow surgery that prevents full movement of L wrist. Pt reports that she wore the sling provided in ED for a couple of weeks after the fall but has not worn it since. PMH includes anemia, arthritis (back, L elbow, L hip, L ankle), asthma, DM, GERD, headache, hyperparathyroidism, lymphedema, seizure, sleep apnea. Underwent Left rotator cuff repair with Dr. Gavel on November 28, 2024.   PAIN:  Are you having pain?   PRECAUTIONS: None  WEIGHT BEARING RESTRICTIONS: No  FALLS:  Has patient fallen in last 6 months? Yes. Number of falls 1- pt reports that this incident is her only fall  LIVING ENVIRONMENT: Lives with: lives with their spouse Lives in: Mobile home Stairs: Yes: External: 4 steps; can reach both Has following equipment at home: Single point cane and Walker - 2 wheeled  OCCUPATION: Works in ED at [employer]- pt reports that the pain hasn't affected her job duties that much because she is R handed.  PLOF: Independent  PATIENT GOALS:  Pt wants to be able to hold her 55 month old grandson, get range of motion back to use her L arm normally.  NEXT MD VISIT: 10/12/23  OBJECTIVE:  Note: Objective measures were completed at Evaluation unless otherwise noted.  DIAGNOSTIC FINDINGS:  DG Humerus L  (08/30/23): FINDINGS: There is no evidence of an acute fracture or dislocation. Radiopaque surgical screws and fixation wires are seen within the left olecranon process and left radial head. Soft tissues are unremarkable.   IMPRESSION: 1. No acute osseous abnormality. 2. Postoperative changes of the left elbow.  PATIENT SURVEYS:  QuickDASH Score: 68.2 / 100 = 68.2 %  COGNITION: Overall cognitive status: Within functional limits for tasks assessed     SENSATION: WFL- bilateral UE and LE  POSTURE: Pt holds L UE in guarded position at rest. L shoulder in sling upon arrival, but doffs with ease.   UPPER EXTREMITY ROM:   Active ROM Right eval Left PROM eval  Shoulder flexion 155 90  Shoulder extension    Shoulder abduction 120 90  Shoulder adduction    Shoulder internal rotation 75 NT  Shoulder external rotation 50 NT  Elbow flexion    Elbow extension    Wrist flexion    Wrist extension    Wrist ulnar deviation    Wrist radial deviation    Wrist pronation    Wrist supination    (Blank rows = not tested)   UPPER EXTREMITY MMT:  MMT Right eval Left eval  Shoulder flexion 4 NT  Shoulder extension    Shoulder abduction 3+ NT  Shoulder adduction    Shoulder internal rotation    Shoulder external rotation    Middle trapezius    Lower trapezius    Elbow flexion 4 NT  Elbow extension 4 NT  Wrist flexion    Wrist extension    Wrist ulnar deviation    Wrist radial deviation    Wrist pronation    Wrist supination    Grip strength (lbs)    (Blank rows = not tested)   TREATMENT DATE: 04/11/24 -UBE 5 minutes, alternating  -BUE flexion towel slides on stairs railings x25 -LUE ABDCT towel slide x10 -TRX strap ABDCT walkouts 3x45sec   -standing wand flexion, wide grip PVC, x8 -stranding cable row 1x15 12.5lb  -standing wand flexion, wide grip PVC, x8 -stranding cable row 1x15 12.5lb  -seated LUE ER 1x15 @ 0lb (elbow supported in 60 degrees ABDCT, 70 degrees  horizontal ABDCT from the sagital plane)  -seated LUE ER 1x10 @ 1lb    PATIENT EDUCATION: Education details: Exercise technique Person educated: Patient Education method: Explanation Education comprehension: verbalized understanding  HOME EXERCISE PROGRAM: Access Code: [email or web address] URL: [email or web address] Date: 12/11/2023 Prepared by: Sidra Simpers  Exercises - Circular Shoulder Pendulum with Table Support  - 1 x daily - 7 x weekly - 3 sets - 10 reps - Flexion-Extension Shoulder Pendulum with Table Support  - 1 x daily - 7 x weekly - 3 sets - 10 reps - Horizontal Shoulder Pendulum with Table Support  - 1 x daily - 7 x weekly - 3 sets - 10 reps - Supine Scapular Retraction  - 1 x daily - 7 x weekly - 3 sets - 10 reps - Seated Shoulder Shrug Circles AROM Backward  - 1 x daily - 7 x weekly - 3 sets - 10 reps - Wrist AROM Flexion Extension  - 1 x daily - 7 x weekly - 3 sets - 10 reps - Elbow Flexion PROM  - 1 x daily - 7 x weekly - 3 sets - 10 reps - Elbow Extension PROM  - 1 x daily - 7 x weekly - 3 sets - 10 reps - Forearm Pronation PROM  -  1 x daily - 7 x weekly - 3 sets - 10 reps - Forearm Supination PROM  - 1 x daily - 7 x weekly - 3 sets - 10 reps   ASSESSMENT:  CLINICAL IMPRESSION:   Continued with ROM interventions, low to moderate loading in partial, open joint ranges. Pt has fatigue in shoulder, but does not reports franke pain or discomfort with exercises. Patient will benefit from skilled physical therapy intervention to reduce deficits and impairments identified in evaluation, in order to reduce pain, improve quality of life, and maximize activity tolerance for ADL, IADL, and leisure/fitness. Physical therapy will help pt achieve long and short term goals of care.    OBJECTIVE IMPAIRMENTS: Abnormal gait, decreased activity tolerance, decreased endurance, decreased mobility, difficulty walking, decreased ROM, decreased strength, hypomobility, increased edema,  impaired perceived functional ability, impaired flexibility, impaired UE functional use, improper body mechanics, postural dysfunction, and pain.   ACTIVITY LIMITATIONS: carrying, lifting, bending, sitting, standing, squatting, stairs, transfers, bed mobility, bathing, dressing, self feeding, reach over head, hygiene/grooming, locomotion level, and caring for others  PARTICIPATION LIMITATIONS: meal prep, cleaning, laundry, driving, shopping, community activity, occupation, and yard work  PERSONAL FACTORS: Age, Fitness, Profession, and 3+ comorbidities: anemia, arthritis (back, L elbow, L hip, L ankle), asthma, DM, GERD, headache, hyperparathyroidism, lymphedema, seizure, sleep apnea are also affecting patient's functional outcome.   REHAB POTENTIAL: Good  CLINICAL DECISION MAKING: Evolving/moderate complexity  EVALUATION COMPLEXITY: Moderate   GOALS: Goals reviewed with patient? Yes  SHORT TERM GOALS: Target date: 10/24/2023  Patient will be independent in home exercise program to improve strength/mobility for better functional independence with ADLs. Baseline:See HEP above; 11/17/2023=Patient able to verbalize and today demonstrate good understanding of HEP especially now since she has received the results from MRI.  Goal status: MET  LONG TERM GOALS: Target date:05/06/2024  Patient will decrease Quick DASH score by > 8 points demonstrating reduced self-reported upper extremity disability. Baseline: 68.2/100 03/05/24: 77.3/100 Goal status: PROGRESSING  2.  Patient will improve L shoulder AROM to > 140 degrees of flexion, scaption, and abduction for improved ability to perform overhead activities. Baseline: avoided AROM due to surgical procedure 01/10/24: avoided AROM due to surgical procedure 03/05/24: 103 deg flexion AROM, 98 deg abduction Goal status: PROGRESSING  3.  Patient will report a worst pain of 6/10 on VAS in L shoulder to improve tolerance with ADLs and reduced symptoms  with activities. Baseline: 10/10 at worst 01/10/24: 3/10 at worst 03/05/24: 10/10 at last visit Goal status: NOT MET  4.  Patient will be able to tolerate holding her young grandson without an increase in L shoulder pain to show improved L UE function and increase pt's ability to participate in family care. Baseline: Pt is unable to pick up grandson due to precautions of the L shoulder following RTC repair 01/10/24: Unable to do so at this time due to lifting restrictions and AROM restrictions 03/05/24: Pt was able to hold her grandson, but only able to hold him on her R side.   Goal status: ONGOING   PLAN:  PT FREQUENCY: 2x/week  PT DURATION: 8 weeks  PLANNED INTERVENTIONS: [id number]- PT Re-evaluation, 97750- Physical Performance Testing, 97110-Therapeutic exercises, 97530- Therapeutic activity, [id number]- Neuromuscular re-education, 97535- Self Care, [id number]- Manual therapy, [id number]- Gait training, [id number]- Orthotic Initial, [id number]- Orthotic/Prosthetic subsequent, [phone]- Canalith repositioning, [id number]- Splinting, [id number]- Electrical stimulation (manual), [id number]- Ultrasound, [id number]- Traction (mechanical), [id number]- Parrafin, [id number] (1-2 muscles), [id number] (3+ muscles)- Dry Needling, Patient/Family education, Balance training, Stair training, Taping, Joint  mobilization, Spinal mobilization, Scar mobilization, Vestibular training, DME instructions, Cryotherapy, and Moist heat   PLAN FOR NEXT SESSION:  Continue with strengthening within pain tolerable range  8:35 AM, 04/11/24 Peggye JAYSON Linear, PT, DPT Physical Therapist - [facility]  Outpatient Physical Therapy- [facility] [phone]      "

## 2024-04-15 ENCOUNTER — Ambulatory Visit

## 2024-04-15 NOTE — Therapy (Signed)
 " OUTPATIENT PHYSICAL THERAPY  TREATMENT   Patient Name: Ashley [name] MRN: [id number] DOB:05-Nov-1965, 59 y.o., female Today's Date: 04/16/2024  END OF SESSION:   PT End of Session - 04/16/24 0804     Visit Number 28    Number of Visits 37    Date for Recertification  05/06/24    Authorization Type Workers Comp    Authorization Time Period 12 visits starting 04/05/24    Authorization - Number of Visits 12    Progress Note Due on Visit 30    PT Start Time 0800    PT Stop Time 0830    PT Time Calculation (min) 30 min    Activity Tolerance Patient tolerated treatment well;Patient limited by pain    Behavior During Therapy WFL for tasks assessed/performed            Past Medical History:  Diagnosis Date   Allergy    Anemia    Arthritis    back, left elbow, left hip, left ankle    Asthma    Blood transfusion without reported diagnosis    Complication of anesthesia    delayed emergence   Diabetes mellitus without complication (HCC)    diet controlled   Diverticulosis    GERD (gastroesophageal reflux disease)    Headache    History of hiatal hernia    History of kidney stones    Hyperparathyroidism    Lymphedema    PONV (postoperative nausea and vomiting)    S/P subtotal parathyroidectomy    Seizure (HCC)    as a child   Sleep apnea    cannot tolerate mask   Thyroid  nodule 06/20/2018   Varicose vein of leg    Past Surgical History:  Procedure Laterality Date   ACETABULUM FRACTURE SURGERY Left    CESAREAN SECTION     CHOLECYSTECTOMY     COLONOSCOPY WITH PROPOFOL  N/A 05/31/2021   Procedure: COLONOSCOPY WITH PROPOFOL ;  Surgeon: Maryruth Ole DASEN, MD;  Location: [facility];  Service: Endoscopy;  Laterality: N/A;   ELBOW FRACTURE SURGERY  1995   ESOPHAGOGASTRODUODENOSCOPY (EGD) WITH PROPOFOL  N/A 05/31/2021   Procedure: ESOPHAGOGASTRODUODENOSCOPY (EGD) WITH PROPOFOL ;  Surgeon: Maryruth Ole DASEN, MD;  Location: [facility];  Service: Endoscopy;   Laterality: N/A;   FRACTURE SURGERY     left ankle complete repair, left elbow   HERNIA REPAIR     incisional   LAPAROSCOPIC GASTRIC SLEEVE RESECTION  2014   PARATHYROIDECTOMY N/A 10/25/2018   Procedure: PARATHYROIDECTOMY, DIABETIC, SLEEP APNEA;  Surgeon: Marolyn Nest, MD;  Location: [facility];  Service: General;  Laterality: N/A;   TONSILLECTOMY AND ADENOIDECTOMY N/A 12/08/2015   Procedure: TONSILLECTOMY AND ADENOIDECTOMY;  Surgeon: Chinita Hasten, MD;  Location: [facility];  Service: ENT;  Laterality: N/A;   TOTAL HIP ARTHROPLASTY Left 09/22/2022   Procedure: Left posterior total hip arthroplasty;  Surgeon: Lorelle Hussar, MD;  Location: [facility];  Service: Orthopedics;  Laterality: Left;   TUBAL LIGATION     Patient Active Problem List   Diagnosis Date Noted   Osteoarthritis of left hip 09/22/2022   Side pain 08/16/2022   Musculoskeletal pain 08/16/2022   Varicose veins of both legs with edema 01/05/2021   Swelling of limb 12/30/2020   Diabetes (HCC) 12/30/2020   Lymphedema 12/30/2020   S/P subtotal parathyroidectomy 11/08/2018   Hyperparathyroidism 06/20/2018   Thyroid  nodule 06/20/2018   Intractable pain 04/12/2015    PCP:  Ophelia Sage, MD  REFERRING PROVIDER: Norleen Gavel, MD  REFERRING DIAG:  M25.512 (ICD-10-CM) - Left shoulder pain  M25.562 (ICD-10-CM) - Left knee pain    THERAPY DIAG:  Acute pain of left shoulder  Decreased ROM of left shoulder  Muscle weakness (generalized)  Rationale for Evaluation and Treatment: Rehabilitation  ONSET DATE: 08/30/23  SUBJECTIVE:                                                                                                                                                                                      SUBJECTIVE STATEMENT: Patient reports compliance with HEP, is challenging with the cold due to the weather.   Hand dominance: Right  PERTINENT HISTORY: Pt was seen at [facility] ED on 08/30/23 after a fall sustained at  work. Pt works in the emergency room and reported that she slipped on a wet spot and fell onto her left shoulder and knee to avoid falling onto her L hip. Per ED note: Physical exam is generally reassuring. Shoulder joint itself has normal mobility and I am not concerned about dislocation. No evidence of clavicular injury. Mild proximal humerus fracture is possible and the patient is concerned about the fact that she has had multiple prior orthopedic injuries/fractures due to relatively minor falls. We will proceed with x-rays and anticipate restricted movement with shoulder sling, RICE, etc. Pt underwent L THA last June and has previous history of elbow surgery that prevents full movement of L wrist. Pt reports that she wore the sling provided in ED for a couple of weeks after the fall but has not worn it since. PMH includes anemia, arthritis (back, L elbow, L hip, L ankle), asthma, DM, GERD, headache, hyperparathyroidism, lymphedema, seizure, sleep apnea. Underwent Left rotator cuff repair with Dr. Yvone on November 28, 2024.   PAIN:  Are you having pain?   PRECAUTIONS: None  WEIGHT BEARING RESTRICTIONS: No  FALLS:  Has patient fallen in last 6 months? Yes. Number of falls 1- pt reports that this incident is her only fall  LIVING ENVIRONMENT: Lives with: lives with their spouse Lives in: Mobile home Stairs: Yes: External: 4 steps; can reach both Has following equipment at home: Single point cane and Walker - 2 wheeled  OCCUPATION: Works in ED at [employer]- pt reports that the pain hasn't affected her job duties that much because she is R handed.  PLOF: Independent  PATIENT GOALS:  Pt wants to be able to hold her 11 month old grandson, get range of motion back to use her L arm normally.  NEXT MD VISIT: 10/12/23  OBJECTIVE:  Note: Objective measures were completed at Evaluation unless otherwise noted.  DIAGNOSTIC FINDINGS:  DG Humerus L (08/30/23): FINDINGS: There is no evidence  of an acute  fracture or dislocation. Radiopaque surgical screws and fixation wires are seen within the left olecranon process and left radial head. Soft tissues are unremarkable.   IMPRESSION: 1. No acute osseous abnormality. 2. Postoperative changes of the left elbow.  PATIENT SURVEYS:  QuickDASH Score: 68.2 / 100 = 68.2 %  COGNITION: Overall cognitive status: Within functional limits for tasks assessed     SENSATION: WFL- bilateral UE and LE  POSTURE: Pt holds L UE in guarded position at rest. L shoulder in sling upon arrival, but doffs with ease.   UPPER EXTREMITY ROM:   Active ROM Right eval Left PROM eval  Shoulder flexion 155 90  Shoulder extension    Shoulder abduction 120 90  Shoulder adduction    Shoulder internal rotation 75 NT  Shoulder external rotation 50 NT  Elbow flexion    Elbow extension    Wrist flexion    Wrist extension    Wrist ulnar deviation    Wrist radial deviation    Wrist pronation    Wrist supination    (Blank rows = not tested)   UPPER EXTREMITY MMT:  MMT Right eval Left eval  Shoulder flexion 4 NT  Shoulder extension    Shoulder abduction 3+ NT  Shoulder adduction    Shoulder internal rotation    Shoulder external rotation    Middle trapezius    Lower trapezius    Elbow flexion 4 NT  Elbow extension 4 NT  Wrist flexion    Wrist extension    Wrist ulnar deviation    Wrist radial deviation    Wrist pronation    Wrist supination    Grip strength (lbs)    (Blank rows = not tested)   TREATMENT DATE: 04/16/24  TherEx: Seated: Knees to hip IR 15x; increased motion with repetition Open abduction with cervical extension 15x  Standing side abduction rollout 15x    Standing cervical side bend 15x  Seated flexion holding green swiss ball 10x   TherAct: Modified side plank 30 seconds x3 trials Wall pushup 10x Wall posture 10x 5 second holds cues for keeping shoulders down rather than elevated  Standing:  -swiss ball forward  rollout on table with focus on flexion 15x -isometric contraction 10x pressing down into ball in standing.   PATIENT EDUCATION: Education details: Exercise technique Person educated: Patient Education method: Explanation Education comprehension: verbalized understanding  HOME EXERCISE PROGRAM: Access Code: [email or web address] URL: [email or web address] Date: 12/11/2023 Prepared by: Sidra Simpers  Exercises - Circular Shoulder Pendulum with Table Support  - 1 x daily - 7 x weekly - 3 sets - 10 reps - Flexion-Extension Shoulder Pendulum with Table Support  - 1 x daily - 7 x weekly - 3 sets - 10 reps - Horizontal Shoulder Pendulum with Table Support  - 1 x daily - 7 x weekly - 3 sets - 10 reps - Supine Scapular Retraction  - 1 x daily - 7 x weekly - 3 sets - 10 reps - Seated Shoulder Shrug Circles AROM Backward  - 1 x daily - 7 x weekly - 3 sets - 10 reps - Wrist AROM Flexion Extension  - 1 x daily - 7 x weekly - 3 sets - 10 reps - Elbow Flexion PROM  - 1 x daily - 7 x weekly - 3 sets - 10 reps - Elbow Extension PROM  - 1 x daily - 7 x weekly - 3 sets - 10 reps - Forearm Pronation PROM  -  1 x daily - 7 x weekly - 3 sets - 10 reps - Forearm Supination PROM  - 1 x daily - 7 x weekly - 3 sets - 10 reps   ASSESSMENT:  CLINICAL IMPRESSION:  Patient session limited by needing to leave early. She is very stiff due to increased demand at work and cold weather. Patient will benefit from skilled physical therapy intervention to reduce deficits and impairments identified in evaluation, in order to reduce pain, improve quality of life, and maximize activity tolerance for ADL, IADL, and leisure/fitness. Physical therapy will help pt achieve long and short term goals of care.    OBJECTIVE IMPAIRMENTS: Abnormal gait, decreased activity tolerance, decreased endurance, decreased mobility, difficulty walking, decreased ROM, decreased strength, hypomobility, increased edema, impaired perceived  functional ability, impaired flexibility, impaired UE functional use, improper body mechanics, postural dysfunction, and pain.   ACTIVITY LIMITATIONS: carrying, lifting, bending, sitting, standing, squatting, stairs, transfers, bed mobility, bathing, dressing, self feeding, reach over head, hygiene/grooming, locomotion level, and caring for others  PARTICIPATION LIMITATIONS: meal prep, cleaning, laundry, driving, shopping, community activity, occupation, and yard work  PERSONAL FACTORS: Age, Fitness, Profession, and 3+ comorbidities: anemia, arthritis (back, L elbow, L hip, L ankle), asthma, DM, GERD, headache, hyperparathyroidism, lymphedema, seizure, sleep apnea are also affecting patient's functional outcome.   REHAB POTENTIAL: Good  CLINICAL DECISION MAKING: Evolving/moderate complexity  EVALUATION COMPLEXITY: Moderate   GOALS: Goals reviewed with patient? Yes  SHORT TERM GOALS: Target date: 10/24/2023  Patient will be independent in home exercise program to improve strength/mobility for better functional independence with ADLs. Baseline:See HEP above; 11/17/2023=Patient able to verbalize and today demonstrate good understanding of HEP especially now since she has received the results from MRI.  Goal status: MET  LONG TERM GOALS: Target date:05/06/2024  Patient will decrease Quick DASH score by > 8 points demonstrating reduced self-reported upper extremity disability. Baseline: 68.2/100 03/05/24: 77.3/100 Goal status: PROGRESSING  2.  Patient will improve L shoulder AROM to > 140 degrees of flexion, scaption, and abduction for improved ability to perform overhead activities. Baseline: avoided AROM due to surgical procedure 01/10/24: avoided AROM due to surgical procedure 03/05/24: 103 deg flexion AROM, 98 deg abduction Goal status: PROGRESSING  3.  Patient will report a worst pain of 6/10 on VAS in L shoulder to improve tolerance with ADLs and reduced symptoms with  activities. Baseline: 10/10 at worst 01/10/24: 3/10 at worst 03/05/24: 10/10 at last visit Goal status: NOT MET  4.  Patient will be able to tolerate holding her young grandson without an increase in L shoulder pain to show improved L UE function and increase pt's ability to participate in family care. Baseline: Pt is unable to pick up grandson due to precautions of the L shoulder following RTC repair 01/10/24: Unable to do so at this time due to lifting restrictions and AROM restrictions 03/05/24: Pt was able to hold her grandson, but only able to hold him on her R side.   Goal status: ONGOING   PLAN:  PT FREQUENCY: 2x/week  PT DURATION: 8 weeks  PLANNED INTERVENTIONS: 97164- PT Re-evaluation, 97750- Physical Performance Testing, 97110-Therapeutic exercises, 97530- Therapeutic activity, [id number]- Neuromuscular re-education, 97535- Self Care, [id number]- Manual therapy, [id number]- Gait training, [id number]- Orthotic Initial, [id number]- Orthotic/Prosthetic subsequent, [phone]- Canalith repositioning, [id number]- Splinting, [id number]- Electrical stimulation (manual), [id number]- Ultrasound, [id number]- Traction (mechanical), [id number]- Parrafin, [id number] (1-2 muscles), [id number] (3+ muscles)- Dry Needling, Patient/Family education, Balance training, Stair training, Taping, Joint mobilization, Spinal mobilization, Scar mobilization, Vestibular training, DME  instructions, Cryotherapy, and Moist heat   PLAN FOR NEXT SESSION:  Continue with strengthening within pain tolerable range  8:37 AM, 04/16/24 Shanley Furlough  Leopoldo, PT, DPT Physical Therapist - [facility]  Outpatient Physical Therapy- [facility] [phone]       "

## 2024-04-16 ENCOUNTER — Ambulatory Visit: Payer: Worker's Compensation

## 2024-04-16 DIAGNOSIS — M25512 Pain in left shoulder: Secondary | ICD-10-CM | POA: Diagnosis not present

## 2024-04-16 DIAGNOSIS — M6281 Muscle weakness (generalized): Secondary | ICD-10-CM

## 2024-04-16 DIAGNOSIS — M25612 Stiffness of left shoulder, not elsewhere classified: Secondary | ICD-10-CM

## 2024-04-17 NOTE — Therapy (Signed)
 " OUTPATIENT PHYSICAL THERAPY  TREATMENT   Patient Name: Ashley [name] MRN: [id number] DOB:11-10-1965, 59 y.o., female Today's Date: 04/18/2024  END OF SESSION:   PT End of Session - 04/18/24 1357     Visit Number 29    Number of Visits 37    Date for Recertification  05/06/24    Authorization Type Workers Comp    Authorization Time Period 12 visits starting 04/05/24    Authorization - Number of Visits 12    Progress Note Due on Visit 30    PT Start Time 1400    PT Stop Time 1444    PT Time Calculation (min) 44 min    Activity Tolerance Patient tolerated treatment well;Patient limited by pain    Behavior During Therapy WFL for tasks assessed/performed             Past Medical History:  Diagnosis Date   Allergy    Anemia    Arthritis    back, left elbow, left hip, left ankle    Asthma    Blood transfusion without reported diagnosis    Complication of anesthesia    delayed emergence   Diabetes mellitus without complication (HCC)    diet controlled   Diverticulosis    GERD (gastroesophageal reflux disease)    Headache    History of hiatal hernia    History of kidney stones    Hyperparathyroidism    Lymphedema    PONV (postoperative nausea and vomiting)    S/P subtotal parathyroidectomy    Seizure (HCC)    as a child   Sleep apnea    cannot tolerate mask   Thyroid  nodule 06/20/2018   Varicose vein of leg    Past Surgical History:  Procedure Laterality Date   ACETABULUM FRACTURE SURGERY Left    CESAREAN SECTION     CHOLECYSTECTOMY     COLONOSCOPY WITH PROPOFOL  N/A 05/31/2021   Procedure: COLONOSCOPY WITH PROPOFOL ;  Surgeon: Maryruth Ole DASEN, MD;  Location: [facility];  Service: Endoscopy;  Laterality: N/A;   ELBOW FRACTURE SURGERY  1995   ESOPHAGOGASTRODUODENOSCOPY (EGD) WITH PROPOFOL  N/A 05/31/2021   Procedure: ESOPHAGOGASTRODUODENOSCOPY (EGD) WITH PROPOFOL ;  Surgeon: Maryruth Ole DASEN, MD;  Location: [facility];  Service:  Endoscopy;  Laterality: N/A;   FRACTURE SURGERY     left ankle complete repair, left elbow   HERNIA REPAIR     incisional   LAPAROSCOPIC GASTRIC SLEEVE RESECTION  2014   PARATHYROIDECTOMY N/A 10/25/2018   Procedure: PARATHYROIDECTOMY, DIABETIC, SLEEP APNEA;  Surgeon: Marolyn Nest, MD;  Location: [facility];  Service: General;  Laterality: N/A;   TONSILLECTOMY AND ADENOIDECTOMY N/A 12/08/2015   Procedure: TONSILLECTOMY AND ADENOIDECTOMY;  Surgeon: Chinita Hasten, MD;  Location: [facility];  Service: ENT;  Laterality: N/A;   TOTAL HIP ARTHROPLASTY Left 09/22/2022   Procedure: Left posterior total hip arthroplasty;  Surgeon: Lorelle Hussar, MD;  Location: [facility];  Service: Orthopedics;  Laterality: Left;   TUBAL LIGATION     Patient Active Problem List   Diagnosis Date Noted   Osteoarthritis of left hip 09/22/2022   Side pain 08/16/2022   Musculoskeletal pain 08/16/2022   Varicose veins of both legs with edema 01/05/2021   Swelling of limb 12/30/2020   Diabetes (HCC) 12/30/2020   Lymphedema 12/30/2020   S/P subtotal parathyroidectomy 11/08/2018   Hyperparathyroidism 06/20/2018   Thyroid  nodule 06/20/2018   Intractable pain 04/12/2015    PCP:  Ophelia Sage, MD  REFERRING PROVIDER: Norleen Gavel, MD  REFERRING  DIAG:  M25.512 (ICD-10-CM) - Left shoulder pain  M25.562 (ICD-10-CM) - Left knee pain    THERAPY DIAG:  Acute pain of left shoulder  Decreased ROM of left shoulder  Muscle weakness (generalized)  Acute pain of left knee  Rationale for Evaluation and Treatment: Rehabilitation  ONSET DATE: 08/30/23  SUBJECTIVE:                                                                                                                                                                                      SUBJECTIVE STATEMENT: Patient has not slept yet since yesterday due to working and getting her hair done this morning.  Hand dominance: Right  PERTINENT HISTORY: Pt was seen  at [facility] ED on 08/30/23 after a fall sustained at work. Pt works in the emergency room and reported that she slipped on a wet spot and fell onto her left shoulder and knee to avoid falling onto her L hip. Per ED note: Physical exam is generally reassuring. Shoulder joint itself has normal mobility and I am not concerned about dislocation. No evidence of clavicular injury. Mild proximal humerus fracture is possible and the patient is concerned about the fact that she has had multiple prior orthopedic injuries/fractures due to relatively minor falls. We will proceed with x-rays and anticipate restricted movement with shoulder sling, RICE, etc. Pt underwent L THA last June and has previous history of elbow surgery that prevents full movement of L wrist. Pt reports that she wore the sling provided in ED for a couple of weeks after the fall but has not worn it since. PMH includes anemia, arthritis (back, L elbow, L hip, L ankle), asthma, DM, GERD, headache, hyperparathyroidism, lymphedema, seizure, sleep apnea. Underwent Left rotator cuff repair with Dr. Yvone on November 28, 2024.   PAIN:  Are you having pain?   PRECAUTIONS: None  WEIGHT BEARING RESTRICTIONS: No  FALLS:  Has patient fallen in last 6 months? Yes. Number of falls 1- pt reports that this incident is her only fall  LIVING ENVIRONMENT: Lives with: lives with their spouse Lives in: Mobile home Stairs: Yes: External: 4 steps; can reach both Has following equipment at home: Single point cane and Walker - 2 wheeled  OCCUPATION: Works in ED at [employer]- pt reports that the pain hasn't affected her job duties that much because she is R handed.  PLOF: Independent  PATIENT GOALS:  Pt wants to be able to hold her 83 month old grandson, get range of motion back to use her L arm normally.  NEXT MD VISIT: 10/12/23  OBJECTIVE:  Note: Objective measures were completed at Evaluation unless otherwise noted.  DIAGNOSTIC FINDINGS:  DG Humerus L  (08/30/23): FINDINGS: There is no evidence of an acute fracture or dislocation. Radiopaque surgical screws and fixation wires are seen within the left olecranon process and left radial head. Soft tissues are unremarkable.   IMPRESSION: 1. No acute osseous abnormality. 2. Postoperative changes of the left elbow.  PATIENT SURVEYS:  QuickDASH Score: 68.2 / 100 = 68.2 %  COGNITION: Overall cognitive status: Within functional limits for tasks assessed     SENSATION: WFL- bilateral UE and LE  POSTURE: Pt holds L UE in guarded position at rest. L shoulder in sling upon arrival, but doffs with ease.   UPPER EXTREMITY ROM:   Active ROM Right eval Left PROM eval  Shoulder flexion 155 90  Shoulder extension    Shoulder abduction 120 90  Shoulder adduction    Shoulder internal rotation 75 NT  Shoulder external rotation 50 NT  Elbow flexion    Elbow extension    Wrist flexion    Wrist extension    Wrist ulnar deviation    Wrist radial deviation    Wrist pronation    Wrist supination    (Blank rows = not tested)   UPPER EXTREMITY MMT:  MMT Right eval Left eval  Shoulder flexion 4 NT  Shoulder extension    Shoulder abduction 3+ NT  Shoulder adduction    Shoulder internal rotation    Shoulder external rotation    Middle trapezius    Lower trapezius    Elbow flexion 4 NT  Elbow extension 4 NT  Wrist flexion    Wrist extension    Wrist ulnar deviation    Wrist radial deviation    Wrist pronation    Wrist supination    Grip strength (lbs)    (Blank rows = not tested)   TREATMENT DATE: 04/18/24  TherEx: Seated: Knees to hip IR 15x; increased motion with repetition Open abduction with cervical extension 15x  Standing side abduction rollout 15x  Standing abduction isometric contraction to adduction 10x  finger wall walk 8x flexion, 8x abduction  Standing cervical side bend 15x  Seated flexion holding green swiss ball 10x  GTB ER 15x ; 2 sets  GTB row 10x    TherAct: Modified side plank onto table 10 x 10 seconds   Wall pushup 10x; x 2 sets Wall posture 10x 5 second holds cues for keeping shoulders down rather than elevated; x 2 sets  Standing:  Small green ball 10x clockwise, 10x counterclockwise each UE -swiss ball forward rollout on table with focus on flexion 15x -isometric contraction 10x pressing down into ball in standing.   PATIENT EDUCATION: Education details: Exercise technique Person educated: Patient Education method: Explanation Education comprehension: verbalized understanding  HOME EXERCISE PROGRAM: Access Code: [email or web address] URL: [email or web address] Date: 12/11/2023 Prepared by: Sidra Simpers  Exercises - Circular Shoulder Pendulum with Table Support  - 1 x daily - 7 x weekly - 3 sets - 10 reps - Flexion-Extension Shoulder Pendulum with Table Support  - 1 x daily - 7 x weekly - 3 sets - 10 reps - Horizontal Shoulder Pendulum with Table Support  - 1 x daily - 7 x weekly - 3 sets - 10 reps - Supine Scapular Retraction  - 1 x daily - 7 x weekly - 3 sets - 10 reps - Seated Shoulder Shrug Circles AROM Backward  - 1 x daily - 7 x weekly - 3 sets - 10 reps - Wrist AROM Flexion Extension  - 1 x daily -  7 x weekly - 3 sets - 10 reps - Elbow Flexion PROM  - 1 x daily - 7 x weekly - 3 sets - 10 reps - Elbow Extension PROM  - 1 x daily - 7 x weekly - 3 sets - 10 reps - Forearm Pronation PROM  - 1 x daily - 7 x weekly - 3 sets - 10 reps - Forearm Supination PROM  - 1 x daily - 7 x weekly - 3 sets - 10 reps   ASSESSMENT:  CLINICAL IMPRESSION:  Patient is able to perform increased ROM and strength as she is able to perform interventions with decreased tension and fatigue.  Patient will benefit from skilled physical therapy intervention to reduce deficits and impairments identified in evaluation, in order to reduce pain, improve quality of life, and maximize activity tolerance for ADL, IADL, and leisure/fitness.  Physical therapy will help pt achieve long and short term goals of care.    OBJECTIVE IMPAIRMENTS: Abnormal gait, decreased activity tolerance, decreased endurance, decreased mobility, difficulty walking, decreased ROM, decreased strength, hypomobility, increased edema, impaired perceived functional ability, impaired flexibility, impaired UE functional use, improper body mechanics, postural dysfunction, and pain.   ACTIVITY LIMITATIONS: carrying, lifting, bending, sitting, standing, squatting, stairs, transfers, bed mobility, bathing, dressing, self feeding, reach over head, hygiene/grooming, locomotion level, and caring for others  PARTICIPATION LIMITATIONS: meal prep, cleaning, laundry, driving, shopping, community activity, occupation, and yard work  PERSONAL FACTORS: Age, Fitness, Profession, and 3+ comorbidities: anemia, arthritis (back, L elbow, L hip, L ankle), asthma, DM, GERD, headache, hyperparathyroidism, lymphedema, seizure, sleep apnea are also affecting patient's functional outcome.   REHAB POTENTIAL: Good  CLINICAL DECISION MAKING: Evolving/moderate complexity  EVALUATION COMPLEXITY: Moderate   GOALS: Goals reviewed with patient? Yes  SHORT TERM GOALS: Target date: 10/24/2023  Patient will be independent in home exercise program to improve strength/mobility for better functional independence with ADLs. Baseline:See HEP above; 11/17/2023=Patient able to verbalize and today demonstrate good understanding of HEP especially now since she has received the results from MRI.  Goal status: MET  LONG TERM GOALS: Target date:05/06/2024  Patient will decrease Quick DASH score by > 8 points demonstrating reduced self-reported upper extremity disability. Baseline: 68.2/100 03/05/24: 77.3/100 Goal status: PROGRESSING  2.  Patient will improve L shoulder AROM to > 140 degrees of flexion, scaption, and abduction for improved ability to perform overhead activities. Baseline: avoided AROM  due to surgical procedure 01/10/24: avoided AROM due to surgical procedure 03/05/24: 103 deg flexion AROM, 98 deg abduction Goal status: PROGRESSING  3.  Patient will report a worst pain of 6/10 on VAS in L shoulder to improve tolerance with ADLs and reduced symptoms with activities. Baseline: 10/10 at worst 01/10/24: 3/10 at worst 03/05/24: 10/10 at last visit Goal status: NOT MET  4.  Patient will be able to tolerate holding her young grandson without an increase in L shoulder pain to show improved L UE function and increase pt's ability to participate in family care. Baseline: Pt is unable to pick up grandson due to precautions of the L shoulder following RTC repair 01/10/24: Unable to do so at this time due to lifting restrictions and AROM restrictions 03/05/24: Pt was able to hold her grandson, but only able to hold him on her R side.   Goal status: ONGOING   PLAN:  PT FREQUENCY: 2x/week  PT DURATION: 8 weeks  PLANNED INTERVENTIONS: [id number]- PT Re-evaluation, 97750- Physical Performance Testing, 97110-Therapeutic exercises, 97530- Therapeutic activity, [id number]- Neuromuscular re-education, 97535-  Self Care, [id number]- Manual therapy, [id number]- Gait training, [phone]- Orthotic Initial, [id number]- Orthotic/Prosthetic subsequent, [phone]- Canalith repositioning, [id number]- Splinting, [phone]- Electrical stimulation (manual), [id number]- Ultrasound, [id number]- Traction (mechanical), [id number]- Parrafin, 20560 (1-2 muscles), 20561 (3+ muscles)- Dry Needling, Patient/Family education, Balance training, Stair training, Taping, Joint mobilization, Spinal mobilization, Scar mobilization, Vestibular training, DME instructions, Cryotherapy, and Moist heat   PLAN FOR NEXT SESSION:  Continue with strengthening within pain tolerable range  2:44 PM, 04/18/24 Arch Methot  Leopoldo, PT, DPT Physical Therapist - [facility]  Outpatient Physical Therapy- Main [facility] [phone]       "

## 2024-04-18 ENCOUNTER — Ambulatory Visit: Payer: Worker's Compensation | Attending: Orthopedic Surgery

## 2024-04-18 DIAGNOSIS — M25562 Pain in left knee: Secondary | ICD-10-CM | POA: Diagnosis present

## 2024-04-18 DIAGNOSIS — M25612 Stiffness of left shoulder, not elsewhere classified: Secondary | ICD-10-CM | POA: Insufficient documentation

## 2024-04-18 DIAGNOSIS — M25512 Pain in left shoulder: Secondary | ICD-10-CM | POA: Insufficient documentation

## 2024-04-18 DIAGNOSIS — M6281 Muscle weakness (generalized): Secondary | ICD-10-CM | POA: Insufficient documentation

## 2024-04-24 ENCOUNTER — Ambulatory Visit: Payer: Worker's Compensation

## 2024-04-24 DIAGNOSIS — M25612 Stiffness of left shoulder, not elsewhere classified: Secondary | ICD-10-CM

## 2024-04-24 DIAGNOSIS — M6281 Muscle weakness (generalized): Secondary | ICD-10-CM

## 2024-04-24 DIAGNOSIS — M25512 Pain in left shoulder: Secondary | ICD-10-CM | POA: Diagnosis not present

## 2024-04-24 NOTE — Therapy (Signed)
 " OUTPATIENT PHYSICAL THERAPY  TREATMENT/ Physical Therapy Progress Note   Dates of reporting period  03/05/24   to   04/24/24     Patient Name: Ashley [name] MRN: [id number] DOB:February 07, 1966, 59 y.o., female Today's Date: 04/24/2024  END OF SESSION:   PT End of Session - 04/24/24 0810     Visit Number 30    Number of Visits 37    Date for Recertification  05/06/24    Authorization Type Workers Comp    Authorization Time Period 12 visits starting 04/05/24    Authorization - Number of Visits 12    Progress Note Due on Visit 30    PT Start Time 0810    PT Stop Time 0844    PT Time Calculation (min) 34 min    Activity Tolerance Patient tolerated treatment well;Patient limited by pain    Behavior During Therapy [facility] for tasks assessed/performed             Past Medical History:  Diagnosis Date   Allergy    Anemia    Arthritis    back, left elbow, left hip, left ankle    Asthma    Blood transfusion without reported diagnosis    Complication of anesthesia    delayed emergence   Diabetes mellitus without complication (HCC)    diet controlled   Diverticulosis    GERD (gastroesophageal reflux disease)    Headache    History of hiatal hernia    History of kidney stones    Hyperparathyroidism    Lymphedema    PONV (postoperative nausea and vomiting)    S/P subtotal parathyroidectomy    Seizure (HCC)    as a child   Sleep apnea    cannot tolerate mask   Thyroid  nodule 06/20/2018   Varicose vein of leg    Past Surgical History:  Procedure Laterality Date   ACETABULUM FRACTURE SURGERY Left    CESAREAN SECTION     CHOLECYSTECTOMY     COLONOSCOPY WITH PROPOFOL  N/A 05/31/2021   Procedure: COLONOSCOPY WITH PROPOFOL ;  Surgeon: Maryruth Ole DASEN, MD;  Location: [facility] ENDOSCOPY;  Service: Endoscopy;  Laterality: N/A;   ELBOW FRACTURE SURGERY  1995   ESOPHAGOGASTRODUODENOSCOPY (EGD) WITH PROPOFOL  N/A 05/31/2021   Procedure: ESOPHAGOGASTRODUODENOSCOPY (EGD) WITH  PROPOFOL ;  Surgeon: Maryruth Ole DASEN, MD;  Location: [facility] ENDOSCOPY;  Service: Endoscopy;  Laterality: N/A;   FRACTURE SURGERY     left ankle complete repair, left elbow   HERNIA REPAIR     incisional   LAPAROSCOPIC GASTRIC SLEEVE RESECTION  2014   PARATHYROIDECTOMY N/A 10/25/2018   Procedure: PARATHYROIDECTOMY, DIABETIC, SLEEP APNEA;  Surgeon: Marolyn Nest, MD;  Location: [facility] ORS;  Service: General;  Laterality: N/A;   TONSILLECTOMY AND ADENOIDECTOMY N/A 12/08/2015   Procedure: TONSILLECTOMY AND ADENOIDECTOMY;  Surgeon: Chinita Hasten, MD;  Location: [facility] ORS;  Service: ENT;  Laterality: N/A;   TOTAL HIP ARTHROPLASTY Left 09/22/2022   Procedure: Left posterior total hip arthroplasty;  Surgeon: Lorelle Hussar, MD;  Location: [facility] ORS;  Service: Orthopedics;  Laterality: Left;   TUBAL LIGATION     Patient Active Problem List   Diagnosis Date Noted   Osteoarthritis of left hip 09/22/2022   Side pain 08/16/2022   Musculoskeletal pain 08/16/2022   Varicose veins of both legs with edema 01/05/2021   Swelling of limb 12/30/2020   Diabetes (HCC) 12/30/2020   Lymphedema 12/30/2020   S/P subtotal parathyroidectomy 11/08/2018   Hyperparathyroidism 06/20/2018   Thyroid  nodule 06/20/2018  Intractable pain 04/12/2015    PCP:  Ophelia Sage, MD  REFERRING PROVIDER: Norleen Gavel, MD  REFERRING DIAG:  [phone] (ICD-10-CM) - Left shoulder pain  M25.562 (ICD-10-CM) - Left knee pain    THERAPY DIAG:  Acute pain of left shoulder  Decreased ROM of left shoulder  Muscle weakness (generalized)  Rationale for Evaluation and Treatment: Rehabilitation  ONSET DATE: 08/30/23  SUBJECTIVE:                                                                                                                                                                                      SUBJECTIVE STATEMENT: Patient came at the wrong time and wrong day. Sees the doctor tomorrow.   Hand dominance:  Right  PERTINENT HISTORY: Pt was seen at [facility] ED on 08/30/23 after a fall sustained at work. Pt works in the emergency room and reported that she slipped on a wet spot and fell onto her left shoulder and knee to avoid falling onto her L hip. Per ED note: Physical exam is generally reassuring. Shoulder joint itself has normal mobility and I am not concerned about dislocation. No evidence of clavicular injury. Mild proximal humerus fracture is possible and the patient is concerned about the fact that she has had multiple prior orthopedic injuries/fractures due to relatively minor falls. We will proceed with x-rays and anticipate restricted movement with shoulder sling, RICE, etc. Pt underwent L THA last June and has previous history of elbow surgery that prevents full movement of L wrist. Pt reports that she wore the sling provided in ED for a couple of weeks after the fall but has not worn it since. PMH includes anemia, arthritis (back, L elbow, L hip, L ankle), asthma, DM, GERD, headache, hyperparathyroidism, lymphedema, seizure, sleep apnea. Underwent Left rotator cuff repair with Dr. Gavel on November 28, 2024.   PAIN:  Are you having pain?   PRECAUTIONS: None  WEIGHT BEARING RESTRICTIONS: No  FALLS:  Has patient fallen in last 6 months? Yes. Number of falls 1- pt reports that this incident is her only fall  LIVING ENVIRONMENT: Lives with: lives with their spouse Lives in: Mobile home Stairs: Yes: External: 4 steps; can reach both Has following equipment at home: Single point cane and Walker - 2 wheeled  OCCUPATION: Works in ED at [employer]- pt reports that the pain hasn't affected her job duties that much because she is R handed.  PLOF: Independent  PATIENT GOALS:  Pt wants to be able to hold her 107 month old grandson, get range of motion back to use her L arm normally.  NEXT MD VISIT: 10/12/23  OBJECTIVE:  Note: Objective measures  were completed at Evaluation unless otherwise  noted.  DIAGNOSTIC FINDINGS:  DG Humerus L (08/30/23): FINDINGS: There is no evidence of an acute fracture or dislocation. Radiopaque surgical screws and fixation wires are seen within the left olecranon process and left radial head. Soft tissues are unremarkable.   IMPRESSION: 1. No acute osseous abnormality. 2. Postoperative changes of the left elbow.  PATIENT SURVEYS:  QuickDASH Score: 68.2 / 100 = 68.2 %  COGNITION: Overall cognitive status: Within functional limits for tasks assessed     SENSATION: WFL- bilateral UE and LE  POSTURE: Pt holds L UE in guarded position at rest. L shoulder in sling upon arrival, but doffs with ease.   UPPER EXTREMITY ROM:   Active ROM Right eval Left PROM eval  Shoulder flexion 155 90  Shoulder extension    Shoulder abduction 120 90  Shoulder adduction    Shoulder internal rotation 75 NT  Shoulder external rotation 50 NT  Elbow flexion    Elbow extension    Wrist flexion    Wrist extension    Wrist ulnar deviation    Wrist radial deviation    Wrist pronation    Wrist supination    (Blank rows = not tested)   UPPER EXTREMITY MMT:  MMT Right eval Left eval  Shoulder flexion 4 NT  Shoulder extension    Shoulder abduction 3+ NT  Shoulder adduction    Shoulder internal rotation    Shoulder external rotation    Middle trapezius    Lower trapezius    Elbow flexion 4 NT  Elbow extension 4 NT  Wrist flexion    Wrist extension    Wrist ulnar deviation    Wrist radial deviation    Wrist pronation    Wrist supination    Grip strength (lbs)    (Blank rows = not tested)   TREATMENT DATE: 04/24/24  TherEx: Seated: Knees to hip IR 15x; increased motion with repetition Open abduction with cervical extension 15x  Standing side abduction rollout 15x  Standing abduction isometric contraction to adduction 10x  finger wall walk 8x flexion, 8x abduction  Standing cervical side bend 15x  GTB ER 15x ; GTB row 10x   TherAct:   Wall pushup 10x; patient reports increased discomfort this session   Standing:  Swiss ball flexion on wall for increased ROM 10x -swiss ball forward rollout on table with focus on flexion 15x -isometric contraction 10x pressing down into ball in standing.   PATIENT EDUCATION: Education details: Exercise technique Person educated: Patient Education method: Explanation Education comprehension: verbalized understanding  HOME EXERCISE PROGRAM: Access Code: [email or web address] URL: [email or web address] Date: 12/11/2023 Prepared by: Sidra Simpers  Exercises - Circular Shoulder Pendulum with Table Support  - 1 x daily - 7 x weekly - 3 sets - 10 reps - Flexion-Extension Shoulder Pendulum with Table Support  - 1 x daily - 7 x weekly - 3 sets - 10 reps - Horizontal Shoulder Pendulum with Table Support  - 1 x daily - 7 x weekly - 3 sets - 10 reps - Supine Scapular Retraction  - 1 x daily - 7 x weekly - 3 sets - 10 reps - Seated Shoulder Shrug Circles AROM Backward  - 1 x daily - 7 x weekly - 3 sets - 10 reps - Wrist AROM Flexion Extension  - 1 x daily - 7 x weekly - 3 sets - 10 reps - Elbow Flexion PROM  - 1 x daily - 7 x weekly -  3 sets - 10 reps - Elbow Extension PROM  - 1 x daily - 7 x weekly - 3 sets - 10 reps - Forearm Pronation PROM  - 1 x daily - 7 x weekly - 3 sets - 10 reps - Forearm Supination PROM  - 1 x daily - 7 x weekly - 3 sets - 10 reps   ASSESSMENT:  CLINICAL IMPRESSION:    Patient has regression of ROM due to cold resulting in decreased functional movement and increased pain this session. Overall prior to today's session her ROM has been functionally increasing.  Her pain is overall getting better with decreased worse pain and decreased frequency of pain. Patient's quickdash affected by recent cold weather and demands at work making it harder for her to move in her personal life and work.  Patient's condition has the potential to improve in response to therapy. Maximum  improvement is yet to be obtained. The anticipated improvement is attainable and reasonable in a generally predictable time.  The patient arrives to the scheduled therapy session later than the originally planned start time, and as a direct result of this delayed arrival, the overall treatment session is shortened and more abbreviated than what would typically occur during a full-length appointment. Due to the reduced available time, the session is necessarily condensed, though skilled interventions are still provided within the limited timeframe. Physical therapy will help pt achieve long and short term goals of care.    OBJECTIVE IMPAIRMENTS: Abnormal gait, decreased activity tolerance, decreased endurance, decreased mobility, difficulty walking, decreased ROM, decreased strength, hypomobility, increased edema, impaired perceived functional ability, impaired flexibility, impaired UE functional use, improper body mechanics, postural dysfunction, and pain.   ACTIVITY LIMITATIONS: carrying, lifting, bending, sitting, standing, squatting, stairs, transfers, bed mobility, bathing, dressing, self feeding, reach over head, hygiene/grooming, locomotion level, and caring for others  PARTICIPATION LIMITATIONS: meal prep, cleaning, laundry, driving, shopping, community activity, occupation, and yard work  PERSONAL FACTORS: Age, Fitness, Profession, and 3+ comorbidities: anemia, arthritis (back, L elbow, L hip, L ankle), asthma, DM, GERD, headache, hyperparathyroidism, lymphedema, seizure, sleep apnea are also affecting patient's functional outcome.   REHAB POTENTIAL: Good  CLINICAL DECISION MAKING: Evolving/moderate complexity  EVALUATION COMPLEXITY: Moderate   GOALS: Goals reviewed with patient? Yes  SHORT TERM GOALS: Target date: 10/24/2023  Patient will be independent in home exercise program to improve strength/mobility for better functional independence with ADLs. Baseline:See HEP above;  11/17/2023=Patient able to verbalize and today demonstrate good understanding of HEP especially now since she has received the results from MRI.  Goal status: MET  LONG TERM GOALS: Target date:05/06/2024  Patient will decrease Quick DASH score by > 8 points demonstrating reduced self-reported upper extremity disability. Baseline: 68.2/100 03/05/24: 77.3/100 1/28: 68%  Goal status: PROGRESSING  2.  Patient will improve L shoulder AROM to > 140 degrees of flexion, scaption, and abduction for improved ability to perform overhead activities. Baseline: avoided AROM due to surgical procedure 01/10/24: avoided AROM due to surgical procedure 03/05/24: 103 deg flexion AROM, 98 deg abduction 04/24/24: 90 deg flexion, 75 deg abduction ; after prolonged AAROM 119 flexion 82 abduction  Goal status: PROGRESSING  3.  Patient will report a worst pain of 6/10 on VAS in L shoulder to improve tolerance with ADLs and reduced symptoms with activities. Baseline: 10/10 at worst 01/10/24: 3/10 at worst 03/05/24: 10/10 at last visit 1/28: 5/10 pain  Goal status: Partially Met  4.  Patient will be able to tolerate holding her young  grandson without an increase in L shoulder pain to show improved L UE function and increase pt's ability to participate in family care. Baseline: Pt is unable to pick up grandson due to precautions of the L shoulder following RTC repair 01/10/24: Unable to do so at this time due to lifting restrictions and AROM restrictions 03/05/24: Pt was able to hold her grandson, but only able to hold him on her R side. 1/28: unable to hold on L side, he is 28 lb at this time   Goal status: ONGOING   PLAN:  PT FREQUENCY: 2x/week  PT DURATION: 8 weeks  PLANNED INTERVENTIONS: 97164- PT Re-evaluation, 97750- Physical Performance Testing, 97110-Therapeutic exercises, 97530- Therapeutic activity, [id number]- Neuromuscular re-education, 97535- Self Care, 02859- Manual therapy, [id number]- Gait training, [id number]-  Orthotic Initial, [phone]- Orthotic/Prosthetic subsequent, [phone]- Canalith repositioning, [id number]- Splinting, [id number]- Electrical stimulation (manual), [id number]- Ultrasound, [id number]- Traction (mechanical), [id number]- Parrafin, 20560 (1-2 muscles), 20561 (3+ muscles)- Dry Needling, Patient/Family education, Balance training, Stair training, Taping, Joint mobilization, Spinal mobilization, Scar mobilization, Vestibular training, DME instructions, Cryotherapy, and Moist heat   PLAN FOR NEXT SESSION:  Continue with strengthening within pain tolerable range  8:51 AM, 04/24/24 Simar Pothier  Leopoldo, PT, DPT Physical Therapist - [facility]- [facility] [phone]       "

## 2024-04-25 ENCOUNTER — Ambulatory Visit: Payer: Self-pay | Attending: Orthopedic Surgery

## 2024-04-29 ENCOUNTER — Ambulatory Visit: Payer: Self-pay

## 2024-04-30 ENCOUNTER — Ambulatory Visit

## 2024-05-01 ENCOUNTER — Ambulatory Visit: Payer: Self-pay | Attending: Orthopedic Surgery

## 2024-05-01 DIAGNOSIS — M6281 Muscle weakness (generalized): Secondary | ICD-10-CM

## 2024-05-01 DIAGNOSIS — M25562 Pain in left knee: Secondary | ICD-10-CM

## 2024-05-01 DIAGNOSIS — M25612 Stiffness of left shoulder, not elsewhere classified: Secondary | ICD-10-CM

## 2024-05-01 DIAGNOSIS — M25512 Pain in left shoulder: Secondary | ICD-10-CM

## 2024-05-01 NOTE — Therapy (Signed)
 " OUTPATIENT PHYSICAL THERAPY  TREATMENT/ Physical Therapy Progress Note   Dates of reporting period  03/05/24   to   05/01/24     Patient Name: Ashley [name] MRN: [id number] DOB:27-Mar-1966, 59 y.o., female Today's Date: 05/01/2024  END OF SESSION:   PT End of Session - 05/01/24 0756     Visit Number 31    Number of Visits 37    Date for Recertification  05/06/24    Authorization Type Workers Comp    Authorization Time Period 12 visits starting 04/05/24    Authorization - Number of Visits 12    Progress Note Due on Visit 30    PT Start Time 0800    PT Stop Time 0845    PT Time Calculation (min) 45 min    Activity Tolerance Patient tolerated treatment well;Patient limited by pain    Behavior During Therapy WFL for tasks assessed/performed             Past Medical History:  Diagnosis Date   Allergy    Anemia    Arthritis    back, left elbow, left hip, left ankle    Asthma    Blood transfusion without reported diagnosis    Complication of anesthesia    delayed emergence   Diabetes mellitus without complication (HCC)    diet controlled   Diverticulosis    GERD (gastroesophageal reflux disease)    Headache    History of hiatal hernia    History of kidney stones    Hyperparathyroidism    Lymphedema    PONV (postoperative nausea and vomiting)    S/P subtotal parathyroidectomy    Seizure (HCC)    as a child   Sleep apnea    cannot tolerate mask   Thyroid  nodule 06/20/2018   Varicose vein of leg    Past Surgical History:  Procedure Laterality Date   ACETABULUM FRACTURE SURGERY Left    CESAREAN SECTION     CHOLECYSTECTOMY     COLONOSCOPY WITH PROPOFOL  N/A 05/31/2021   Procedure: COLONOSCOPY WITH PROPOFOL ;  Surgeon: Maryruth Ole DASEN, MD;  Location: [facility];  Service: Endoscopy;  Laterality: N/A;   ELBOW FRACTURE SURGERY  1995   ESOPHAGOGASTRODUODENOSCOPY (EGD) WITH PROPOFOL  N/A 05/31/2021   Procedure: ESOPHAGOGASTRODUODENOSCOPY (EGD) WITH  PROPOFOL ;  Surgeon: Maryruth Ole DASEN, MD;  Location: [facility];  Service: Endoscopy;  Laterality: N/A;   FRACTURE SURGERY     left ankle complete repair, left elbow   HERNIA REPAIR     incisional   LAPAROSCOPIC GASTRIC SLEEVE RESECTION  2014   PARATHYROIDECTOMY N/A 10/25/2018   Procedure: PARATHYROIDECTOMY, DIABETIC, SLEEP APNEA;  Surgeon: Marolyn Nest, MD;  Location: [facility];  Service: General;  Laterality: N/A;   TONSILLECTOMY AND ADENOIDECTOMY N/A 12/08/2015   Procedure: TONSILLECTOMY AND ADENOIDECTOMY;  Surgeon: Chinita Hasten, MD;  Location: [facility];  Service: ENT;  Laterality: N/A;   TOTAL HIP ARTHROPLASTY Left 09/22/2022   Procedure: Left posterior total hip arthroplasty;  Surgeon: Lorelle Hussar, MD;  Location: [facility];  Service: Orthopedics;  Laterality: Left;   TUBAL LIGATION     Patient Active Problem List   Diagnosis Date Noted   Osteoarthritis of left hip 09/22/2022   Side pain 08/16/2022   Musculoskeletal pain 08/16/2022   Varicose veins of both legs with edema 01/05/2021   Swelling of limb 12/30/2020   Diabetes (HCC) 12/30/2020   Lymphedema 12/30/2020   S/P subtotal parathyroidectomy 11/08/2018   Hyperparathyroidism 06/20/2018   Thyroid  nodule 06/20/2018  Intractable pain 04/12/2015    PCP:  Ophelia Sage, MD  REFERRING PROVIDER: Norleen Gavel, MD  REFERRING DIAG:  [phone] (ICD-10-CM) - Left shoulder pain  M25.562 (ICD-10-CM) - Left knee pain    THERAPY DIAG:  Acute pain of left shoulder  Decreased ROM of left shoulder  Muscle weakness (generalized)  Acute pain of left knee  Rationale for Evaluation and Treatment: Rehabilitation  ONSET DATE: 08/30/23  SUBJECTIVE:                                                                                                                                                                                      SUBJECTIVE STATEMENT: Pt reports that we should be getting some new orders soon. Also should be  getting orders for the L knee. She reports she had a steroid injection last week which was the second one. Pt reports having about a 4/10 pain in the shoulder upon arrival. She reports that she did work last night so it is extra aggravated.   Hand dominance: Right  PERTINENT HISTORY: Pt was seen at [facility] ED on 08/30/23 after a fall sustained at work. Pt works in the emergency room and reported that she slipped on a wet spot and fell onto her left shoulder and knee to avoid falling onto her L hip. Per ED note: Physical exam is generally reassuring. Shoulder joint itself has normal mobility and I am not concerned about dislocation. No evidence of clavicular injury. Mild proximal humerus fracture is possible and the patient is concerned about the fact that she has had multiple prior orthopedic injuries/fractures due to relatively minor falls. We will proceed with x-rays and anticipate restricted movement with shoulder sling, RICE, etc. Pt underwent L THA last June and has previous history of elbow surgery that prevents full movement of L wrist. Pt reports that she wore the sling provided in ED for a couple of weeks after the fall but has not worn it since. PMH includes anemia, arthritis (back, L elbow, L hip, L ankle), asthma, DM, GERD, headache, hyperparathyroidism, lymphedema, seizure, sleep apnea. Underwent Left rotator cuff repair with Dr. Gavel on November 28, 2024.   PAIN:  Are you having pain?   PRECAUTIONS: None  WEIGHT BEARING RESTRICTIONS: No  FALLS:  Has patient fallen in last 6 months? Yes. Number of falls 1- pt reports that this incident is her only fall  LIVING ENVIRONMENT: Lives with: lives with their spouse Lives in: Mobile home Stairs: Yes: External: 4 steps; can reach both Has following equipment at home: Single point cane and Walker - 2 wheeled  OCCUPATION: Works in ED at [employer]- pt reports that the pain hasn't  affected her job duties that much because she is R handed.  PLOF:  Independent  PATIENT GOALS:  Pt wants to be able to hold her 60 month old grandson, get range of motion back to use her L arm normally.  NEXT MD VISIT: 10/12/23  OBJECTIVE:  Note: Objective measures were completed at Evaluation unless otherwise noted.  DIAGNOSTIC FINDINGS:  DG Humerus L (08/30/23): FINDINGS: There is no evidence of an acute fracture or dislocation. Radiopaque surgical screws and fixation wires are seen within the left olecranon process and left radial head. Soft tissues are unremarkable.   IMPRESSION: 1. No acute osseous abnormality. 2. Postoperative changes of the left elbow.  PATIENT SURVEYS:  QuickDASH Score: 68.2 / 100 = 68.2 %  COGNITION: Overall cognitive status: Within functional limits for tasks assessed     SENSATION: WFL- bilateral UE and LE  POSTURE: Pt holds L UE in guarded position at rest. L shoulder in sling upon arrival, but doffs with ease.   UPPER EXTREMITY ROM:   Active ROM Right eval Left PROM eval  Shoulder flexion 155 90  Shoulder extension    Shoulder abduction 120 90  Shoulder adduction    Shoulder internal rotation 75 NT  Shoulder external rotation 50 NT  Elbow flexion    Elbow extension    Wrist flexion    Wrist extension    Wrist ulnar deviation    Wrist radial deviation    Wrist pronation    Wrist supination    (Blank rows = not tested)   UPPER EXTREMITY MMT:  MMT Right eval Left eval  Shoulder flexion 4 NT  Shoulder extension    Shoulder abduction 3+ NT  Shoulder adduction    Shoulder internal rotation    Shoulder external rotation    Middle trapezius    Lower trapezius    Elbow flexion 4 NT  Elbow extension 4 NT  Wrist flexion    Wrist extension    Wrist ulnar deviation    Wrist radial deviation    Wrist pronation    Wrist supination    Grip strength (lbs)    (Blank rows = not tested)   TREATMENT DATE: 05/01/24   Today:  -Pulleys: Flexion: 2 minutes ; Abduction: 2 minutes.  - finger ladder  walk 10x flexion(26), 8x abduction  -Isometric walk-outs: GTB: x10 with 5'' holds IR/ER/ Flex/ ABD/EXT -Ball on wall: 20x up/down; 10x lateral: 20x circles  -GTB Rows: 1x15 -IR with pulleys x2'     PATIENT EDUCATION: Education details: Exercise technique Person educated: Patient Education method: Explanation Education comprehension: verbalized understanding  HOME EXERCISE PROGRAM: Access Code: [email or web address] URL: [email or web address] Date: 12/11/2023 Prepared by: Sidra Simpers  Exercises - Circular Shoulder Pendulum with Table Support  - 1 x daily - 7 x weekly - 3 sets - 10 reps - Flexion-Extension Shoulder Pendulum with Table Support  - 1 x daily - 7 x weekly - 3 sets - 10 reps - Horizontal Shoulder Pendulum with Table Support  - 1 x daily - 7 x weekly - 3 sets - 10 reps - Supine Scapular Retraction  - 1 x daily - 7 x weekly - 3 sets - 10 reps - Seated Shoulder Shrug Circles AROM Backward  - 1 x daily - 7 x weekly - 3 sets - 10 reps - Wrist AROM Flexion Extension  - 1 x daily - 7 x weekly - 3 sets - 10 reps - Elbow Flexion PROM  - 1 x daily - 7  x weekly - 3 sets - 10 reps - Elbow Extension PROM  - 1 x daily - 7 x weekly - 3 sets - 10 reps - Forearm Pronation PROM  - 1 x daily - 7 x weekly - 3 sets - 10 reps - Forearm Supination PROM  - 1 x daily - 7 x weekly - 3 sets - 10 reps   ASSESSMENT:  CLINICAL IMPRESSION:    Patient continued to focus on improving L shoulder mobility and strength with various AAROM, isometric, and stabilization exercises. She continues to have limited mobility in L shoulder with flex,abd, and IR. Pts most limiting factor appears to be pain in the shoulder as she tends to have pain at end range with exercises, but was instructed to stay in pain tolerable range and to not push it too hard. Decreased stability/strength also noted during ball on wall exercise today as she has difficulty holding the L arm up to 90 degrees for any extended period of  time. Recommended she perform pain management techniques upon returning home (ice/rest). She continues to be motivated to improve and is a good candidate for continuing skilled PT at this time.    OBJECTIVE IMPAIRMENTS: Abnormal gait, decreased activity tolerance, decreased endurance, decreased mobility, difficulty walking, decreased ROM, decreased strength, hypomobility, increased edema, impaired perceived functional ability, impaired flexibility, impaired UE functional use, improper body mechanics, postural dysfunction, and pain.   ACTIVITY LIMITATIONS: carrying, lifting, bending, sitting, standing, squatting, stairs, transfers, bed mobility, bathing, dressing, self feeding, reach over head, hygiene/grooming, locomotion level, and caring for others  PARTICIPATION LIMITATIONS: meal prep, cleaning, laundry, driving, shopping, community activity, occupation, and yard work  PERSONAL FACTORS: Age, Fitness, Profession, and 3+ comorbidities: anemia, arthritis (back, L elbow, L hip, L ankle), asthma, DM, GERD, headache, hyperparathyroidism, lymphedema, seizure, sleep apnea are also affecting patient's functional outcome.   REHAB POTENTIAL: Good  CLINICAL DECISION MAKING: Evolving/moderate complexity  EVALUATION COMPLEXITY: Moderate   GOALS: Goals reviewed with patient? Yes  SHORT TERM GOALS: Target date: 10/24/2023  Patient will be independent in home exercise program to improve strength/mobility for better functional independence with ADLs. Baseline:See HEP above; 11/17/2023=Patient able to verbalize and today demonstrate good understanding of HEP especially now since she has received the results from MRI.  Goal status: MET  LONG TERM GOALS: Target date:05/06/2024  Patient will decrease Quick DASH score by > 8 points demonstrating reduced self-reported upper extremity disability. Baseline: 68.2/100 03/05/24: 77.3/100 1/28: 68%  Goal status: PROGRESSING  2.  Patient will improve L shoulder  AROM to > 140 degrees of flexion, scaption, and abduction for improved ability to perform overhead activities. Baseline: avoided AROM due to surgical procedure 01/10/24: avoided AROM due to surgical procedure 03/05/24: 103 deg flexion AROM, 98 deg abduction 04/24/24: 90 deg flexion, 75 deg abduction ; after prolonged AAROM 119 flexion 82 abduction  Goal status: PROGRESSING  3.  Patient will report a worst pain of 6/10 on VAS in L shoulder to improve tolerance with ADLs and reduced symptoms with activities. Baseline: 10/10 at worst 01/10/24: 3/10 at worst 03/05/24: 10/10 at last visit 1/28: 5/10 pain  Goal status: Partially Met  4.  Patient will be able to tolerate holding her young grandson without an increase in L shoulder pain to show improved L UE function and increase pt's ability to participate in family care. Baseline: Pt is unable to pick up grandson due to precautions of the L shoulder following RTC repair 01/10/24: Unable to do so at this  time due to lifting restrictions and AROM restrictions 03/05/24: Pt was able to hold her grandson, but only able to hold him on her R side. 1/28: unable to hold on L side, he is 28 lb at this time   Goal status: ONGOING   PLAN:  PT FREQUENCY: 2x/week  PT DURATION: 8 weeks  PLANNED INTERVENTIONS: [id number]- PT Re-evaluation, 97750- Physical Performance Testing, 97110-Therapeutic exercises, 97530- Therapeutic activity, [id number]- Neuromuscular re-education, 97535- Self Care, [id number]- Manual therapy, [id number]- Gait training, [phone]- Orthotic Initial, [id number]- Orthotic/Prosthetic subsequent, [phone]- Canalith repositioning, [id number]- Splinting, [id number]- Electrical stimulation (manual), [id number]- Ultrasound, [id number]- Traction (mechanical), [id number]- Parrafin, 20560 (1-2 muscles), 20561 (3+ muscles)- Dry Needling, Patient/Family education, Balance training, Stair training, Taping, Joint mobilization, Spinal mobilization, Scar mobilization, Vestibular training, DME instructions,  Cryotherapy, and Moist heat   PLAN FOR NEXT SESSION:  Continue with strengthening within pain tolerable range  8:53 AM, 05/01/24 Norman Sharps, PT, DPT Physical Therapist - [facility]      "

## 2024-05-02 ENCOUNTER — Ambulatory Visit

## 2024-05-06 ENCOUNTER — Ambulatory Visit: Payer: Worker's Compensation

## 2024-05-08 ENCOUNTER — Ambulatory Visit

## 2024-05-13 ENCOUNTER — Ambulatory Visit: Payer: Worker's Compensation | Admitting: Physical Therapy

## 2024-05-15 ENCOUNTER — Ambulatory Visit
